# Patient Record
Sex: Female | Born: 1950
Health system: Southern US, Community
[De-identification: ages and names within clinical notes are randomized; demographics above are authoritative.]

## PROBLEM LIST (undated history)

## (undated) DIAGNOSIS — D126 Benign neoplasm of colon, unspecified: Secondary | ICD-10-CM

## (undated) DIAGNOSIS — F419 Anxiety disorder, unspecified: Secondary | ICD-10-CM

## (undated) DIAGNOSIS — K219 Gastro-esophageal reflux disease without esophagitis: Secondary | ICD-10-CM

## (undated) DIAGNOSIS — Z86010 Personal history of colonic polyps: Secondary | ICD-10-CM

## (undated) DIAGNOSIS — R51 Headache: Secondary | ICD-10-CM

## (undated) DIAGNOSIS — I10 Essential (primary) hypertension: Secondary | ICD-10-CM

## (undated) DIAGNOSIS — Z6841 Body Mass Index (BMI) 40.0 and over, adult: Secondary | ICD-10-CM

## (undated) DIAGNOSIS — M1990 Unspecified osteoarthritis, unspecified site: Secondary | ICD-10-CM

## (undated) DIAGNOSIS — R739 Hyperglycemia, unspecified: Secondary | ICD-10-CM

## (undated) DIAGNOSIS — E876 Hypokalemia: Secondary | ICD-10-CM

## (undated) DIAGNOSIS — E785 Hyperlipidemia, unspecified: Secondary | ICD-10-CM

## (undated) DIAGNOSIS — K589 Irritable bowel syndrome without diarrhea: Secondary | ICD-10-CM

## (undated) DIAGNOSIS — IMO0001 Reserved for inherently not codable concepts without codable children: Secondary | ICD-10-CM

## (undated) DIAGNOSIS — R519 Headache, unspecified: Secondary | ICD-10-CM

## (undated) DIAGNOSIS — K5730 Diverticulosis of large intestine without perforation or abscess without bleeding: Secondary | ICD-10-CM

## (undated) DIAGNOSIS — Z981 Arthrodesis status: Secondary | ICD-10-CM

## (undated) DIAGNOSIS — R1013 Epigastric pain: Secondary | ICD-10-CM

## (undated) DIAGNOSIS — M51369 Other intervertebral disc degeneration, lumbar region without mention of lumbar back pain or lower extremity pain: Secondary | ICD-10-CM

## (undated) DIAGNOSIS — T7840XA Allergy, unspecified, initial encounter: Secondary | ICD-10-CM

## (undated) DIAGNOSIS — K921 Melena: Secondary | ICD-10-CM

## (undated) DIAGNOSIS — K210 Gastro-esophageal reflux disease with esophagitis: Secondary | ICD-10-CM

## (undated) DIAGNOSIS — M5136 Other intervertebral disc degeneration, lumbar region: Secondary | ICD-10-CM

## (undated) DIAGNOSIS — D649 Anemia, unspecified: Secondary | ICD-10-CM

## (undated) DIAGNOSIS — J309 Allergic rhinitis, unspecified: Secondary | ICD-10-CM

## (undated) DIAGNOSIS — E669 Obesity, unspecified: Secondary | ICD-10-CM

## (undated) DIAGNOSIS — E119 Type 2 diabetes mellitus without complications: Secondary | ICD-10-CM

## (undated) HISTORY — PX: TONSILLECTOMY AND ADENOIDECTOMY: SUR1326

## (undated) HISTORY — DX: Anxiety disorder, unspecified: F41.9

## (undated) HISTORY — DX: Benign neoplasm of colon, unspecified: D12.6

## (undated) HISTORY — DX: Diverticulosis of large intestine without perforation or abscess without bleeding: K57.30

## (undated) HISTORY — PX: SPINE SURGERY: SHX786

## (undated) HISTORY — DX: Obesity, unspecified: E66.9

## (undated) HISTORY — PX: OTHER SURGICAL HISTORY: SHX169

## (undated) HISTORY — DX: Body Mass Index (BMI) 40.0 and over, adult: Z684

## (undated) HISTORY — DX: Essential (primary) hypertension: I10

## (undated) HISTORY — DX: Hyperlipidemia, unspecified: E78.5

## (undated) HISTORY — DX: Hypokalemia: E87.6

## (undated) HISTORY — DX: Type 2 diabetes mellitus without complications: E11.9

## (undated) HISTORY — DX: Arthrodesis status: Z98.1

## (undated) HISTORY — DX: Other intervertebral disc degeneration, lumbar region: M51.36

## (undated) HISTORY — DX: Other intervertebral disc degeneration, lumbar region without mention of lumbar back pain or lower extremity pain: M51.369

## (undated) HISTORY — DX: Unspecified osteoarthritis, unspecified site: M19.90

## (undated) HISTORY — DX: Allergy, unspecified, initial encounter: T78.40XA

## (undated) HISTORY — PX: ABDOMINAL HYSTERECTOMY: SHX81

## (undated) HISTORY — DX: Hyperglycemia, unspecified: R73.9

## (undated) HISTORY — DX: Gastro-esophageal reflux disease without esophagitis: K21.9

## (undated) HISTORY — DX: Melena: K92.1

## (undated) HISTORY — DX: Allergic rhinitis, unspecified: J30.9

## (undated) HISTORY — DX: Gastro-esophageal reflux disease with esophagitis: K21.0

## (undated) HISTORY — DX: Personal history of colonic polyps: Z86.010

## (undated) HISTORY — DX: Reserved for inherently not codable concepts without codable children: IMO0001

## (undated) HISTORY — DX: Epigastric pain: R10.13

## (undated) HISTORY — DX: Anemia, unspecified: D64.9

## (undated) HISTORY — DX: Irritable bowel syndrome without diarrhea: K58.9

---

## 1999-04-08 ENCOUNTER — Encounter (INDEPENDENT_AMBULATORY_CARE_PROVIDER_SITE_OTHER): Payer: Self-pay | Admitting: Specialist

## 1999-04-08 ENCOUNTER — Other Ambulatory Visit: Admission: RE | Admit: 1999-04-08 | Discharge: 1999-04-08 | Payer: Self-pay | Admitting: Gastroenterology

## 1999-04-08 ENCOUNTER — Encounter: Payer: Self-pay | Admitting: Gastroenterology

## 1999-05-04 ENCOUNTER — Emergency Department (HOSPITAL_COMMUNITY): Admission: EM | Admit: 1999-05-04 | Discharge: 1999-05-04 | Payer: Self-pay | Admitting: Emergency Medicine

## 2002-05-22 ENCOUNTER — Encounter: Payer: Self-pay | Admitting: Family Medicine

## 2002-05-22 ENCOUNTER — Encounter: Admission: RE | Admit: 2002-05-22 | Discharge: 2002-05-22 | Payer: Self-pay | Admitting: Family Medicine

## 2002-06-25 ENCOUNTER — Encounter: Admission: RE | Admit: 2002-06-25 | Discharge: 2002-09-23 | Payer: Self-pay | Admitting: Occupational Medicine

## 2005-06-30 ENCOUNTER — Encounter: Admission: RE | Admit: 2005-06-30 | Discharge: 2005-06-30 | Payer: Self-pay | Admitting: Orthopaedic Surgery

## 2005-10-12 ENCOUNTER — Inpatient Hospital Stay (HOSPITAL_COMMUNITY): Admission: RE | Admit: 2005-10-12 | Discharge: 2005-10-17 | Payer: Self-pay | Admitting: Orthopaedic Surgery

## 2006-10-09 DIAGNOSIS — D126 Benign neoplasm of colon, unspecified: Secondary | ICD-10-CM

## 2006-10-09 HISTORY — DX: Benign neoplasm of colon, unspecified: D12.6

## 2007-02-01 ENCOUNTER — Ambulatory Visit: Payer: Self-pay | Admitting: Gastroenterology

## 2007-02-15 ENCOUNTER — Ambulatory Visit: Payer: Self-pay | Admitting: Gastroenterology

## 2007-02-15 ENCOUNTER — Encounter: Payer: Self-pay | Admitting: Gastroenterology

## 2008-08-31 ENCOUNTER — Encounter: Payer: Self-pay | Admitting: Nurse Practitioner

## 2008-12-28 ENCOUNTER — Ambulatory Visit: Payer: Self-pay | Admitting: Gastroenterology

## 2008-12-28 DIAGNOSIS — K5730 Diverticulosis of large intestine without perforation or abscess without bleeding: Secondary | ICD-10-CM

## 2008-12-28 DIAGNOSIS — K2100 Gastro-esophageal reflux disease with esophagitis, without bleeding: Secondary | ICD-10-CM

## 2008-12-28 DIAGNOSIS — Z860100 Personal history of colon polyps, unspecified: Secondary | ICD-10-CM

## 2008-12-28 DIAGNOSIS — K219 Gastro-esophageal reflux disease without esophagitis: Secondary | ICD-10-CM

## 2008-12-28 DIAGNOSIS — R1013 Epigastric pain: Secondary | ICD-10-CM

## 2008-12-28 HISTORY — DX: Personal history of colon polyps, unspecified: Z86.0100

## 2008-12-28 HISTORY — DX: Epigastric pain: R10.13

## 2008-12-28 HISTORY — DX: Diverticulosis of large intestine without perforation or abscess without bleeding: K57.30

## 2008-12-28 HISTORY — DX: Gastro-esophageal reflux disease without esophagitis: K21.9

## 2008-12-28 HISTORY — DX: Gastro-esophageal reflux disease with esophagitis, without bleeding: K21.00

## 2008-12-28 HISTORY — DX: Personal history of colonic polyps: Z86.010

## 2008-12-29 ENCOUNTER — Ambulatory Visit: Payer: Self-pay | Admitting: Gastroenterology

## 2008-12-29 ENCOUNTER — Encounter: Payer: Self-pay | Admitting: Gastroenterology

## 2008-12-31 ENCOUNTER — Encounter: Payer: Self-pay | Admitting: Gastroenterology

## 2010-02-04 ENCOUNTER — Encounter (INDEPENDENT_AMBULATORY_CARE_PROVIDER_SITE_OTHER): Payer: Self-pay | Admitting: *Deleted

## 2010-03-22 ENCOUNTER — Encounter: Payer: Self-pay | Admitting: Gastroenterology

## 2010-04-15 ENCOUNTER — Encounter (INDEPENDENT_AMBULATORY_CARE_PROVIDER_SITE_OTHER): Payer: Self-pay | Admitting: *Deleted

## 2010-04-19 ENCOUNTER — Ambulatory Visit: Payer: Self-pay | Admitting: Gastroenterology

## 2010-05-03 ENCOUNTER — Ambulatory Visit: Payer: Self-pay | Admitting: Gastroenterology

## 2010-05-05 ENCOUNTER — Encounter: Payer: Self-pay | Admitting: Gastroenterology

## 2010-11-08 NOTE — Letter (Signed)
Summary: [facility] Instructions  [facility]  [address] [place], [place] [address]   Phone: [phone]  Fax: [phone]       AMPARO Herring    July 26, 1951    MRN: [id number]        Procedure Day /Date: Tuesday 05-03-10     Arrival Time: 12:30 p.m.      Procedure Time: 1:30 p.m.     Location of Procedure:                    _x_  [facility] (4th Floor)                        PREPARATION FOR COLONOSCOPY WITH MOVIPREP   Starting 5 days prior to your procedure  04-28-10 do not eat nuts, seeds, popcorn, corn, beans, peas,  salads, or any raw vegetables.  Do not take any fiber supplements (e.g. Metamucil, Citrucel, and Benefiber).  THE DAY BEFORE YOUR PROCEDURE         DATE:  05-02-10   DAY:  Monday  1.  Drink clear liquids the entire day-NO SOLID FOOD  2.  Do not drink anything colored red or purple.  Avoid juices with pulp.  No orange juice.  3.  Drink at least 64 oz. (8 glasses) of fluid/clear liquids during the day to prevent dehydration and help the prep work efficiently.  CLEAR LIQUIDS INCLUDE: Water Jello Ice Popsicles Tea (sugar ok, no milk/cream) Powdered fruit flavored drinks Coffee (sugar ok, no milk/cream) Gatorade Juice: apple, white grape, white cranberry  Lemonade Clear bullion, consomm, broth Carbonated beverages (any kind) Strained chicken noodle soup Hard Candy                             4.  In the morning, mix first dose of MoviPrep solution:    Empty 1 Pouch A and 1 Pouch B into the disposable container    Add lukewarm drinking water to the top line of the container. Mix to dissolve    Refrigerate (mixed solution should be used within 24 hrs)  5.  Begin drinking the prep at 5:00 p.m. The MoviPrep container is divided by 4 marks.   Every 15 minutes drink the solution down to the next mark (approximately 8 oz) until the full liter is complete.   6.  Follow completed prep with 16 oz of clear liquid of your choice  (Nothing red or purple).  Continue to drink clear liquids until bedtime.  7.  Before going to bed, mix second dose of MoviPrep solution:    Empty 1 Pouch A and 1 Pouch B into the disposable container    Add lukewarm drinking water to the top line of the container. Mix to dissolve    Refrigerate  THE DAY OF YOUR PROCEDURE      DATE:  05-03-10  DAY:  Tuesday  Beginning at  8:30 a.m. (5 hours before procedure):         1. Every 15 minutes, drink the solution down to the next mark (approx 8 oz) until the full liter is complete.  2. Follow completed prep with 16 oz. of clear liquid of your choice.    3. You may drink clear liquids until  11:30 a.m. (2 HOURS BEFORE PROCEDURE).   MEDICATION INSTRUCTIONS  Unless otherwise instructed, you should take regular prescription medications with a small sip of water  as early as possible the morning of your procedure.   Additional medication instructions:  Hold diuretics the morning of procedure.         OTHER INSTRUCTIONS  You will need a responsible adult at least 58 years of age to accompany you and drive you home.   This person must remain in the waiting room during your procedure.  Wear loose fitting clothing that is easily removed.  Leave jewelry and other valuables at home.  However, you may wish to bring a book to read or  an iPod/MP3 player to listen to music as you wait for your procedure to start.  Remove all body piercing jewelry and leave at home.  Total time from sign-in until discharge is approximately 2-3 hours.  You should go home directly after your procedure and rest.  You can resume normal activities the  day after your procedure.  The day of your procedure you should not:   Drive   Make legal decisions   Operate machinery   Drink alcohol   Return to work  You will receive specific instructions about eating, activities and medications before you leave.    The above instructions have been reviewed  and explained to me by  Megan Almas RN  April 19, 2010 2:31 PM     I fully understand and can verbalize these instructions _____________________________ Date _________

## 2010-11-08 NOTE — Miscellaneous (Signed)
Summary: [facility] Previsit/prep  Clinical Lists Changes  Medications: Added new medication of MOVIPREP 100 GM  SOLR (PEG-KCL-NACL-NASULF-NA ASC-C) As per prep instructions. - Signed Rx of MOVIPREP 100 GM  SOLR (PEG-KCL-NACL-NASULF-NA ASC-C) As per prep instructions.;  #1 x 0;  Signed;  Entered by: Wyona Almas RN;  Authorized by: Meryl Dare MD [facility];  Method used: Electronically to [id number] [address] [place], [place]  [address], Ph: [phone] or [phone], Fax: [phone] Observations: Added new observation of ALLERGY REV: Done (04/19/2010 13:52)    Prescriptions: MOVIPREP 100 GM  SOLR (PEG-KCL-NACL-NASULF-NA ASC-C) As per prep instructions.  #1 x 0   Entered by:   Wyona Almas RN   Authorized by:   Meryl Dare MD [facility]   Signed by:   Wyona Almas RN on 04/19/2010   Method used:   Electronically to        [id number] (retail)       [address]       [place], [place]  [address]       Ph: [phone] or [phone]       Fax: [phone]   RxID:   [id number]

## 2010-11-08 NOTE — Letter (Signed)
Summary: Colonoscopy Letter  [facility]  [address] [place], [place] [address]   Phone: [phone]  Fax: [phone]      February 04, 2010 MRN: [id number]   [name] [address] [place], [place]  [address]   Dear [name],   According to your medical record, it is time for you to schedule a Colonoscopy. The American Cancer Society recommends this procedure as a method to detect early colon cancer. Patients with a family history of colon cancer, or a personal history of colon polyps or inflammatory bowel disease are at increased risk.  This letter has beeen generated based on the recommendations made at the time of your procedure. If you feel that in your particular situation this may no longer apply, please contact our office.  Please call our office at [phone] to schedule this appointment or to update your records at your earliest convenience.  Thank you for cooperating with [place] to provide you with the very best care possible.   Sincerely,  Judie Petit T. Russella Dar, M.D.  [facility]astro[facility] [phone]

## 2010-11-08 NOTE — Procedures (Signed)
Summary: Colonoscopy  Patient: [name] Note: All result statuses are Final unless otherwise noted.  Tests: (1) Colonoscopy (COL)   COL Colonoscopy           DONE     [facility]     [address]     [place], [place]  [address]           COLONOSCOPY PROCEDURE REPORT           PATIENT:  [name], [name]  MR#:  [id number]     BIRTHDATE:  1951-03-13, 58 yrs. old  GENDER:  female     ENDOSCOPIST:  Judie Petit T. Russella Dar, MD, [facility]           PROCEDURE DATE:  05/03/2010     PROCEDURE:  Colonoscopy with snare polypectomy     ASA CLASS:  Class II     INDICATIONS:  1) follow-up of polyp, tubulovillous adenoma,     02/2007. 2) Screening and high-risk surveillance     MEDICATIONS:   Fentanyl 75 mcg IV, Versed 9 mg IV     DESCRIPTION OF PROCEDURE:   After the risks benefits and     alternatives of the procedure were thoroughly explained, informed     consent was obtained.  Digital rectal exam was performed and     revealed no abnormalities.   The LB [id number]-[id number] endoscope     was introduced through the anus and advanced to the cecum, which     was identified by both the appendix and ileocecal valve, without     limitations.  The quality of the prep was good, using MoviPrep.     The instrument was then slowly withdrawn as the colon was fully     examined.     <<PROCEDUREIMAGES>>     FINDINGS:  Severe diverticulosis was found ascending colon to     sigmoid colon. More concentrated in the left colon. Seven polyps     were found in the sigmoid colon. They were 4 - 6 mm in size.     Polyps were snared without cautery. Retrieval was successful. This     was otherwise a normal examination of the colon. Retroflexed views     in the rectum revealed no abnormalities. The time to cecum =  7     minutes. The scope was then withdrawn (time =  11.75  min) from the     patient and the procedure completed.           COMPLICATIONS:  None           ENDOSCOPIC IMPRESSION:     1) Severe  diverticulosis ascending colon to sigmoid colon     2) 4 - 6 mm, seven polyps in the sigmoid colon           RECOMMENDATIONS:     1) Await pathology results     2) High fiber diet with liberal fluid intake.     3) Repeat Colonoscopy in 3 years.           Venita Lick. Russella Dar, MD, [facility]           CC: Ellamae Sia, MD           n.     Rosalie DoctorVenita Lick. Aleesia Henney at 05/03/2010 02:12 PM           Kyla Balzarine, [phone]  Note: An exclamation mark (!) indicates a result that was not dispersed into the flowsheet.  Document Creation Date: 05/03/2010 2:12 PM _______________________________________________________________________  (1) Order result status: Final Collection or observation date-time: 05/03/2010 14:05 Requested date-time:  Receipt date-time:  Reported date-time:  Referring Physician:   Ordering Physician: Claudette Head [phone]) Specimen Source:  Source: Launa Grill Order Number: [id number] Lab site:   Appended Document: Colonoscopy     Procedures Next Due Date:    Colonoscopy: 04/2013

## 2010-11-08 NOTE — Letter (Signed)
Summary: Patient Notice-Hyperplastic Polyps  [facility]  [address] [place], [place] [address]   Phone: [phone]  Fax: [phone]        May 05, 2010 MRN: [id number]    [name] [address] [place], [place]  [address]    Dear [name],  I am pleased to inform you that the colon polyp(s) removed during your recent colonoscopy was (were) found to be hyperplastic. These types of polyps are NOT pre-cancerous.  It is my recommendation that you have a repeat colonoscopy examination in 3 years for routine colorectal cancer screening.  Should you develop new or worsening symptoms of abdominal pain, bowel habit changes or bleeding from the rectum or bowels, please schedule an evaluation with either your primary care physician or with me.  Continue treatment plan as outlined the day of your exam.  Please call us if you are having persistent problems or have questions about your condition that have not been fully answered at this time.  Sincerely,  Meryl Dare MD [facility]  This letter has been electronically signed by your physician.  Appended Document: Patient Notice-Hyperplastic Polyps letter mailed 8.1.2011

## 2011-02-24 NOTE — Discharge Summary (Signed)
[name], [name] NO.:  [id number]   MEDICAL RECORD NO.:  [id number]          PATIENT TYPE:  INP   LOCATION:  5024                         FACILITY:  [facility]   PHYSICIAN:  Sharolyn Douglas, M.D.        DATE OF BIRTH:  1951-04-19   DATE OF ADMISSION:  10/12/2005  DATE OF DISCHARGE:  10/17/2005                                 DISCHARGE SUMMARY   ADMITTING DIAGNOSES:  1.  Degenerative lumbar spondylolisthesis, L4-5, with spinal stenosis.  2.  Hypertension.  3.  Hypercholesterolemia.   DISCHARGE DIAGNOSES:  1.  Status post L4-5 decompression and posterior spinal fusion, doing well.  2.  Postoperative blood loss with anemia.  3.  Postoperative hyperglycemia.  4.  Hypertension.  5.  Hypercholesterolemia.   PROCEDURE:  On October 12, 2005, patient was taken to the operating room for  an L4-L5 posterior spinal fusion with pedicle screws.  This was done by Sharolyn Douglas, M.D., with assistant [facility], PA-C.  Anesthesia used was  general.   CONSULTS:  None.   LABS:  Preoperative CBC with diff was within normal limits.  H&H was  monitored times three days postoperatively and reached a low of 10.7 and  31.1 on October 15, 2005; no treatment was required.  Coags were within  normal limits preoperatively.  Preoperative complete metabolic panel was  normal with the exception of glucose slightly elevated at 111.  Basic  metabolic panel monitored times two days postoperatively.  Glucose was 131  and 111 on postoperative days one and two, respectively, and otherwise  within normal limits.  UA preop was normal.  Blood type was A positive.  Antibody screens were negative.  EKG from October 06, 2005, showed sinus  bradycardia; otherwise normal and confirmed in the chart.   X-RAYS:  Portable spine was used on October 12, 2005, for localization.  On  October 17, 2005, two views of lumbar spine showed status post L4-5 fusion.  Good position and alignment.   BRIEF HISTORY:  Patient  is a 60 year old female who has had longstanding  problems with her back, pain and radiation into the groin and down her lower  extremities, right worse than the left.  She also has cramping sensation in  her legs and sometimes feels as though her legs are going to give way.  She has had numerous types of conservative treatment, all unfortunately  without any lasting relief of her symptoms.  The pain has gotten to the  point that it was significantly affecting her activities of daily living and  quality of life.  She was quite miserable secondary to the severe pain.  Because of her x-ray and MRI findings as well as her physical exam findings  and pain, it was thought her best course of management was the above-listed  procedure.  Risks and benefits of this were discussed with the patient by  Dr. Noel Gerold.  She indicated understanding and opted to proceed.   HOSPITAL COURSE:  On October 12, 2005, patient was taken to the operating  room and underwent the above listed procedure.  She  tolerated the procedure  well without any intraoperative complications and was transferred to the  recovery room in stable condition.  Postoperatively, routine orthopedic  spine protocol was followed.  She progressed along well and did not develop  any significant complications postoperatively.  She worked with physical  therapy and occupational therapy on a daily basis.  She progressed along  well working on her progressive ambulation program as well as brace use and  back precautions.  By day of discharge, she was independent and understood  these well.   Starting on postoperative day two, daily dressing changes were done as well  as her Hemovac drain was discontinued.  Her incision continued to look good  throughout her entire hospital stay without any signs or symptoms of  infection.  On October 16, 2005, patient was doing relatively well; however,  she lives alone and she was not completely independent with all  of her  activities of daily living and ambulation as well as brace use and  therefore, she was kept one more day to work further with physical therapy.  By October 17, 2005, patient had met all her stated goals.  She was doing  well.  She was independent and she was medically stable and ready for  discharge home.   DISCHARGE PLAN:  Patient is a 60 year old female, status post posterior  spinal fusion, doing well.   ACTIVITY AND DISCHARGE INSTRUCTIONS:  Daily ambulation.  Brace on when she  is on her feet.  Back precautions at all time.  No lifting heavier than 5  pounds.  Daily dressing changes to her back.  Keep incision dry until all  drainage stops.   FOLLOWUP:  Two weeks postoperatively with Dr. Noel Gerold.   DISCHARGE MEDICATIONS:  1.  Percocet for pain.  2.  Robaxin for muscle spasms.  3.  Multivitamin daily.  4.  Calcium 1000 daily.  5.  Colace 100 mg twice daily.  6.  Laxative as needed.  7.  Resume home medications.   CONDITION ON DISCHARGE:  Stable and improved.   DISPOSITION:  Patient will be discharged to her home with family assistance  as well as home health physical therapy and occupational therapy.      Verlin Fester, P.A.      Sharolyn Douglas, M.D.  Electronically Signed    CM/MEDQ  D:  12/25/2005  T:  12/26/2005  Job:  [id number]   cc:   Sharolyn Douglas, M.D.  Fax: [phone]

## 2011-02-24 NOTE — Op Note (Signed)
[name], [place] NO.:  [id number]   MEDICAL RECORD NO.:  [id number]          PATIENT TYPE:  INP   LOCATION:  5024                         FACILITY:  [facility]   PHYSICIAN:  Sharolyn Douglas, M.D.        DATE OF BIRTH:  [birth year]   DATE OF PROCEDURE:  10/12/2005  DATE OF DISCHARGE:                                 OPERATIVE REPORT   DIAGNOSIS:  L4-5 spondylolisthesis with spinal stenosis.   PROCEDURE:  1.  L4-5 lumbar laminectomy with wide decompression thecal sac and nerve      roots.  2.  L4-5 transforaminal lumbar interbody fusion with placement of PEEK cage.  3.  L4-5 pedicle screw instrumentation using Abbott spine system.  4.  L4-5 posterior spinal arthrodesis with local autogenous bone graft      supplemented with bone morphogenic protein.   SURGEON:  Sharolyn Douglas, MD   ASSISTANT:  Verlin Fester, P.A.   ANESTHESIA:  General endotracheal.   COMPLICATIONS:  None.   ESTIMATED BLOOD LOSS:  200 mL.   INDICATIONS:  The patient is a pleasant 60 year old female with chronic  severe back and bilateral leg pain. She has mobile spondylolisthesis at L4-5  with underlying lateral recess narrowing. She has failed to respond to all  conservative treatment modalities and elected to undergo L4-5 decompression  and fusion in hopes of improving her symptoms. She knows the risks and  benefits including adjacent segment problems requiring additional surgery in  the future.   PROCEDURE:  The patient was identified in the holding area, taken to the  operating room. She underwent general endotracheal anesthesia without  difficulty, given prophylactic IV antibiotics. She was carefully positioned  on the Wilson frame. All bony prominences padded. Face and eyes protected at  all times. Back prepped and draped usual sterile fashion. Neuro monitoring  was established in the form of SSEPs and lower extremity EMGs. Midline  incision made from L3 down to L5. Dissection was carried to  the very deep  adipose layer. Subperiosteal exposure carried out to the tips of transverse  processes of L4 and L5.  The L4-5 joint was found be severely hypertrophied  and degenerative with large cyst. Deep retractors placed. Intraoperative x-  ray taken to confirm levels. Pedicle screws were then placed at L4 and L5  using anatomic probing technique. Each pedicle hole was palpated. There were  no breeches. We utilized 6.5 mm screws. The bone quality was good and the  screw purchase was excellent. Each screw was stimulated using triggered  EMGs. There were no deleterious changes. We then turned our attention to  completing a laminectomy at L4-5.  The entire spinous process and lamina of  L4 was removed.  The ligamentum flavum was removed piecemeal using curettes  and Kerrison punches. The lateral recess was decompressed flush with the  pedicles. We identified the transversing nerve roots and ensured that they  were decompressed. We then turned our attention to completing a  transforaminal lumbar interbody fusion on the left side at L4-5 by removing  the residual facette joint. The exiting transversing nerve roots  were  identified and protected.  Free running EMGs were monitored. Radical  diskectomy carried across the contralateral side. Disk space was then  dilated up to 9 mm.  The cartilaginous endplates were scraped. Disk space  packed with local autogenous bone graft along with BMP sponges. 9 mm PEEK  cage was packed with BMP sponge inserted into the interspace, tamped  anteriorly and across the midline. We then completed the posterior spinal  arthrodesis by decorticating the transverse processes of L4-L5 and packing  local bone graft into the lateral gutters. Short segment titanium rods were  placed in the polyaxial screw heads. Compression was applied before shearing  off the locking caps.  Hemostasis was achieved. The wound was irrigated.  Deep Hemovac drain left in place.  Intraoperative x-ray showed appropriate  positioning of the screws and interbody peak cage. The deep fascia closed  with a running #1 Vicryl suture. Subcutaneous layer closed with 2-0 and 0  Vicryl. Skin closed with 3-0 subcuticular Vicryl suture. Benzoin, Steri-  Strips placed. Sterile dressing applied. The patient was turned supine,  extubated without difficulty and transferred to recovery in stable  condition.      Sharolyn Douglas, M.D.  Electronically Signed     MC/MEDQ  D:  10/12/2005  T:  10/13/2005  Job:  [id number]

## 2011-02-24 NOTE — H&P (Signed)
[name], [name] NO.:  [id number]   MEDICAL RECORD NO.:  [id number]          PATIENT TYPE:  INP   LOCATION:  NA                           FACILITY:  [facility]   PHYSICIAN:  [name], M.D.        DATE OF BIRTH:  14-Aug-1951   DATE OF ADMISSION:  10/12/2005  DATE OF DISCHARGE:                                HISTORY & PHYSICAL   CHIEF COMPLAINT:  Pain in my back and leg.   PRESENT ILLNESS:  This 60 year old female seen by [place] for continuing and  progressive problems concerning pain into her lumbar spine with radiation  into the groin area and down in the lower extremities worse on the right  than left.  She has cramping sensation.  Sometimes feel like her legs are  going to give way.  She has had epidural steroid injections which  unfortunately did not last as much as desired.   Her pain began when she was mopping a bathroom floor ___________.  This  occurred on May 22, 2002.  She is really quite tired of her pain and  discomfort and after much discussion including the risks and benefits of  surgery decided to go ahead with surgical intervention due to the L4-L5  spondylolisthesis with accompanying stenosis with a posterior spinal fusion.   PAST MEDICAL HISTORY:  This lady has been in relatively good health  throughout her lifetime.  She is being treated by Dr. Alwyn Herring of  [facility].   CURRENT MEDICATIONS:  1.  Mobic.  2.  325 aspirin which she will stop prior to surgery.  3.  Hydrocodone for discomfort.  4.  Atenolol 50 mg one daily.  5.  Advicor 50/20 daily.  6.  Tums extra strength with calcium.  7.  Nasal sprays.  8.  Hydrin 5 moisturizing lotion on her skin.   She is allergic to PENICILLIN, TRIMOX, AMOXICILLIN, SULFA, PENICILLIN  DERIVATIVES AND SUBSTITUTES, and CLARITIN.   She has a history of migraine-type headaches.  Currently under high stress  due to her pain problem.  She is hypertensive.   PAST SURGICAL HISTORY:  1.   Tonsillectomy.  2.  Partial hysterectomy.  3.  Full hysterectomy secondary to endometriosis and scar tissue.  4.  Wisdom teeth extractions.   FAMILY HISTORY:  Positive for heart disease, hypertension, diabetes, cancer,  stroke, and arthritis.   SOCIAL HISTORY:  The patient is widowed.  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  She has no intake of alcohol or tobacco products.  Has no  children.  Caregiver after surgery will be her sister.   REVIEW OF SYSTEMS:  CNS:  No seizures, shoulder paralysis, double vision.  The patient does have a radiculitis and radiculopathy in the lower  extremities secondary to present illness.  CARDIOVASCULAR:  No chest pain.  No angina.  No orthopnea.  RESPIRATORY:  No productive cough.  No  hemoptysis.  No shortness of breath.  GASTROINTESTINAL:  No nausea,  vomiting, melena, bloody stools.  GENITOURINARY:  No discharge, dysuria,  hematuria but she does have stress  incontinence.  MUSCULOSKELETAL:  Primarily in present illness.   PHYSICAL EXAMINATION:  GENERAL:  Alert and cooperative, friendly, somewhat  obese 60 year old female.  She moves very carefully during the examination,  must support herself when coming to a standing position as well as when she  re-seats herself.  VITAL SIGNS:  Blood pressure 148/82, pulse 72, respirations 12.  HEENT:  Normocephalic.  PERRLA.  EOMs intact.  Oropharynx is clear.  CHEST:  Clear to auscultation.  No rhonchi.  No rales.  HEART:  Regular rate and rhythm.  No murmurs are heard.  ABDOMEN:  Soft, nontender.  Liver, spleen not felt.  GENITALIA:  Not done.  Not pertinent to present illness.  RECTAL:  Not done.  Not pertinent to present illness.  PELVIC:  Not done.  Not pertinent to present illness.  BREASTS:  Not done.  Not pertinent to present illness.  EXTREMITIES:  Reflexes of the lower extremities are intact and symmetric.  Sensation intact to light touch.  Straight leg raising negative.   ADMITTING  DIAGNOSES:  1.  Degenerative lumbar spondylolisthesis L4-L5 with stenosis.  2.  Hypertension.  3.  Hypercholesterolemia.   PLAN:  The patient will undergo L4-5 posterior spinal fusion and laminectomy  with pedicle screws, transforaminal lumbar interbody fusion with bone  morphogenic protein and Allograft.  Today she is fitted with an EBI  lumbosacral orthosis which will be used postoperatively.  This history and  physical was performed in our office on October 05, 2005.  The patient has  had a medical clearance from Dr. Alwyn Herring of [facility].      [name].      [name], M.D.  Electronically Signed    DLU/MEDQ  D:  10/05/2005  T:  10/05/2005  Job:  [id number]   cc:   [name], M.D.  Fax: [phone]

## 2011-11-07 ENCOUNTER — Other Ambulatory Visit: Payer: Self-pay | Admitting: Physician Assistant

## 2011-11-07 MED ORDER — HYDROCORTISONE 2.5 % EX OINT: TOPICAL_OINTMENT | Freq: Two times a day (BID) | CUTANEOUS | Status: AC

## 2011-12-19 ENCOUNTER — Ambulatory Visit: Payer: Medicare Other | Admitting: Emergency Medicine

## 2012-02-20 ENCOUNTER — Encounter: Payer: Self-pay | Admitting: Emergency Medicine

## 2012-02-20 ENCOUNTER — Ambulatory Visit (INDEPENDENT_AMBULATORY_CARE_PROVIDER_SITE_OTHER): Payer: BC Managed Care – PPO | Admitting: Emergency Medicine

## 2012-02-20 DIAGNOSIS — E119 Type 2 diabetes mellitus without complications: Secondary | ICD-10-CM

## 2012-02-20 DIAGNOSIS — I10 Essential (primary) hypertension: Secondary | ICD-10-CM | POA: Diagnosis not present

## 2012-02-20 DIAGNOSIS — E782 Mixed hyperlipidemia: Secondary | ICD-10-CM

## 2012-02-20 LAB — CBC WITH DIFFERENTIAL/PLATELET
Basophils Relative: 1 % (ref 0–1)
Eosinophils Absolute: 0.2 10*3/uL (ref 0.0–0.7)
HCT: 42.7 % (ref 36.0–46.0)
Hemoglobin: 14.2 g/dL (ref 12.0–15.0)
Lymphocytes Relative: 42 % (ref 12–46)
MCHC: 33.3 g/dL (ref 30.0–36.0)
MCV: 86.6 fL (ref 78.0–100.0)
Monocytes Absolute: 0.4 10*3/uL (ref 0.1–1.0)
Neutro Abs: 1.8 10*3/uL (ref 1.7–7.7)
Neutrophils Relative %: 42 % — ABNORMAL LOW (ref 43–77)
Platelets: 263 10*3/uL (ref 150–400)
RBC: 4.93 MIL/uL (ref 3.87–5.11)
RDW: 14.7 % (ref 11.5–15.5)
WBC: 4.3 10*3/uL (ref 4.0–10.5)

## 2012-02-20 LAB — COMPREHENSIVE METABOLIC PANEL
BUN: 19 mg/dL (ref 6–23)
CO2: 28 mEq/L (ref 19–32)
Calcium: 9.9 mg/dL (ref 8.4–10.5)
Creat: 0.84 mg/dL (ref 0.50–1.10)
Sodium: 141 mEq/L (ref 135–145)
Total Protein: 7 g/dL (ref 6.0–8.3)

## 2012-02-20 LAB — LIPID PANEL
Total CHOL/HDL Ratio: 4.7 Ratio
VLDL: 21 mg/dL (ref 0–40)

## 2012-02-20 LAB — GLUCOSE, POCT (MANUAL RESULT ENTRY): POC Glucose: 107

## 2012-02-20 NOTE — Progress Notes (Signed)
  Subjective:    Patient ID: [name], female    DOB: 1951/06/03, 61 y.o.   MRN: [id number]  HPI patient here to recheck on her hypertension and high cholesterol and diabetes. She also has had some mild discomfort in the left side of her abdomen but overall has been doing well.    Review of Systems check she has been doing well following her diet trying to take better care of herself     Objective:   Physical Exam  Constitutional: She appears well-developed and well-nourished.  Eyes: Pupils are equal, round, and reactive to light.  Cardiovascular: Normal rate and regular rhythm.     Results for orders placed in visit on 02/20/12  POCT GLYCOSYLATED HEMOGLOBIN (HGB A1C)      Component Value Range   Hemoglobin A1C 5.9    GLUCOSE, POCT (MANUAL RESULT ENTRY)      Component Value Range   POC Glucose 107          Assessment & Plan:  Diabetes is better. Blood pressure is good we'll await her cholesterol and changes depending on the results.

## 2012-04-23 ENCOUNTER — Telehealth: Payer: Self-pay

## 2012-04-23 NOTE — Telephone Encounter (Signed)
Left message on machine to call back  

## 2012-04-23 NOTE — Telephone Encounter (Signed)
Pt needs to speak with Dr Cleta Alberts or nurse she was advised by one of the pa at Dr Noel Gerold about her blood circulation please contact after 1pm today [phone].Marland Kitchen

## 2012-04-23 NOTE — Telephone Encounter (Signed)
Spoke with patient advised her to return to clinic to see Dr Cleta Alberts tomorrow about veins up on her legs her spine Dr. advised her to se  Her primary doctor

## 2012-04-24 ENCOUNTER — Ambulatory Visit (INDEPENDENT_AMBULATORY_CARE_PROVIDER_SITE_OTHER): Payer: Medicare Other | Admitting: Emergency Medicine

## 2012-04-24 VITALS — BP 110/88 | HR 61 | Temp 97.8°F | Resp 18 | Ht 62.5 in | Wt 239.8 lb

## 2012-04-24 DIAGNOSIS — K5900 Constipation, unspecified: Secondary | ICD-10-CM

## 2012-04-24 DIAGNOSIS — N63 Unspecified lump in unspecified breast: Secondary | ICD-10-CM

## 2012-04-24 DIAGNOSIS — R609 Edema, unspecified: Secondary | ICD-10-CM

## 2012-04-24 LAB — POCT CBC
HCT, POC: 45.6 % (ref 37.7–47.9)
Hemoglobin: 14.2 g/dL (ref 12.2–16.2)
Lymph, poc: 1.9 (ref 0.6–3.4)
MCH, POC: 28.6 pg (ref 27–31.2)
MCV: 91.8 fL (ref 80–97)
MID (cbc): 0.5 (ref 0–0.9)
MPV: 9.4 fL (ref 0–99.8)
POC LYMPH PERCENT: 39 %L (ref 10–50)
POC MID %: 10.7 %M (ref 0–12)
Platelet Count, POC: 264 10*3/uL (ref 142–424)
RBC: 4.97 M/uL (ref 4.04–5.48)
WBC: 4.9 10*3/uL (ref 4.6–10.2)

## 2012-04-24 LAB — RENAL FUNCTION PANEL
BUN: 14 mg/dL (ref 6–23)
CO2: 31 mEq/L (ref 19–32)
Calcium: 10 mg/dL (ref 8.4–10.5)
Glucose, Bld: 111 mg/dL — ABNORMAL HIGH (ref 70–99)
Phosphorus: 3.1 mg/dL (ref 2.3–4.6)
Potassium: 3.8 mEq/L (ref 3.5–5.3)
Sodium: 141 mEq/L (ref 135–145)

## 2012-04-24 NOTE — Progress Notes (Signed)
  Subjective:    Patient ID: [name], female    DOB: 07-Apr-1951, 61 y.o.   MRN: [id number]  HPI patient enters for checkup of swelling of the lower extremities she is seen back Dr.Cohen For chronic back problems. She would like to get schedule for a mammogram but has not been able to do that yet. She needs to have Her breast exam prior to getting insurance approval for mammogram.    Review of Systems     Objective:   Physical Exam chest is clear cardiac exam is unremarkable breast exam reveals a vein present over the superior portion of the left breast but no masses are felt there is no nipple retraction examination of the lower extremities really trace edema. She has good pulses and mild varicosities of the lower extremities.  Results for orders placed in visit on 04/24/12  POCT CBC      Component Value Range   WBC 4.9  4.6 - 10.2 K/uL   Lymph, poc 1.9  0.6 - 3.4   POC LYMPH PERCENT 39.0  10 - 50 %L   MID (cbc) 0.5  0 - 0.9   POC MID % 10.7  0 - 12 %M   POC Granulocyte 2.5  2 - 6.9   Granulocyte percent 50.3  37 - 80 %G   RBC 4.97  4.04 - 5.48 M/uL   Hemoglobin 14.2  12.2 - 16.2 g/dL   HCT, POC 40.9  81.1 - 47.9 %   MCV 91.8  80 - 97 fL   MCH, POC 28.6  27 - 31.2 pg   MCHC 31.1 (*) 31.8 - 35.4 g/dL   RDW, POC 91.4     Platelet Count, POC 264  142 - 424 K/uL   MPV 9.4  0 - 99.8 fL        Assessment & Plan:  Patient has varicose veins and this is most likely the source of her swelling in the lower extremities. I do not think is medication related. I will offer her an evaluation by the vein specialist and in the interim she can try wearing support hose. I advised her to go ahead with her mammogram and we will make that appointment. She's also been having some trouble with watery stools after she takes MiraLax and I advised her to switch over to citracel and try that for her constipation problem

## 2012-05-28 ENCOUNTER — Other Ambulatory Visit: Payer: Self-pay | Admitting: Emergency Medicine

## 2012-07-02 ENCOUNTER — Ambulatory Visit (INDEPENDENT_AMBULATORY_CARE_PROVIDER_SITE_OTHER): Payer: BC Managed Care – PPO | Admitting: Emergency Medicine

## 2012-07-02 ENCOUNTER — Other Ambulatory Visit: Payer: Self-pay

## 2012-07-02 ENCOUNTER — Ambulatory Visit: Payer: Medicare Other

## 2012-07-02 ENCOUNTER — Encounter: Payer: Self-pay | Admitting: Emergency Medicine

## 2012-07-02 VITALS — BP 128/90 | HR 52 | Temp 98.1°F | Resp 16 | Ht 62.0 in | Wt 236.8 lb

## 2012-07-02 DIAGNOSIS — Z9109 Other allergy status, other than to drugs and biological substances: Secondary | ICD-10-CM

## 2012-07-02 DIAGNOSIS — R7309 Other abnormal glucose: Secondary | ICD-10-CM

## 2012-07-02 DIAGNOSIS — Z0000 Encounter for general adult medical examination without abnormal findings: Secondary | ICD-10-CM

## 2012-07-02 DIAGNOSIS — R739 Hyperglycemia, unspecified: Secondary | ICD-10-CM

## 2012-07-02 DIAGNOSIS — M542 Cervicalgia: Secondary | ICD-10-CM | POA: Diagnosis not present

## 2012-07-02 DIAGNOSIS — M1990 Unspecified osteoarthritis, unspecified site: Secondary | ICD-10-CM | POA: Insufficient documentation

## 2012-07-02 DIAGNOSIS — Z23 Encounter for immunization: Secondary | ICD-10-CM | POA: Diagnosis not present

## 2012-07-02 DIAGNOSIS — E669 Obesity, unspecified: Secondary | ICD-10-CM

## 2012-07-02 DIAGNOSIS — I10 Essential (primary) hypertension: Secondary | ICD-10-CM

## 2012-07-02 DIAGNOSIS — R748 Abnormal levels of other serum enzymes: Secondary | ICD-10-CM

## 2012-07-02 DIAGNOSIS — Z981 Arthrodesis status: Secondary | ICD-10-CM | POA: Insufficient documentation

## 2012-07-02 DIAGNOSIS — E785 Hyperlipidemia, unspecified: Secondary | ICD-10-CM | POA: Diagnosis not present

## 2012-07-02 DIAGNOSIS — M549 Dorsalgia, unspecified: Secondary | ICD-10-CM

## 2012-07-02 HISTORY — DX: Arthrodesis status: Z98.1

## 2012-07-02 LAB — CBC WITH DIFFERENTIAL/PLATELET
Basophils Absolute: 0 10*3/uL (ref 0.0–0.1)
HCT: 39 % (ref 36.0–46.0)
Lymphs Abs: 1.6 10*3/uL (ref 0.7–4.0)
MCHC: 34.9 g/dL (ref 30.0–36.0)
Monocytes Absolute: 0.5 10*3/uL (ref 0.1–1.0)
Neutro Abs: 2.7 10*3/uL (ref 1.7–7.7)
Neutrophils Relative %: 51 % (ref 43–77)
Platelets: 242 10*3/uL (ref 150–400)
RDW: 14.6 % (ref 11.5–15.5)
WBC: 5.2 10*3/uL (ref 4.0–10.5)

## 2012-07-02 LAB — COMPREHENSIVE METABOLIC PANEL
Albumin: 4.6 g/dL (ref 3.5–5.2)
Alkaline Phosphatase: 67 U/L (ref 39–117)
BUN: 17 mg/dL (ref 6–23)
Chloride: 101 mEq/L (ref 96–112)
Creat: 0.87 mg/dL (ref 0.50–1.10)
Potassium: 3.8 mEq/L (ref 3.5–5.3)
Sodium: 142 mEq/L (ref 135–145)
Total Protein: 7 g/dL (ref 6.0–8.3)

## 2012-07-02 LAB — LIPID PANEL
HDL: 49 mg/dL (ref >39)
Total CHOL/HDL Ratio: 4.6 Ratio

## 2012-07-02 MED ORDER — FLUTICASONE PROPIONATE 50 MCG/ACT NA SUSP: 2.0000 | Freq: Every day | NASAL | Status: DC

## 2012-07-02 MED ORDER — GABAPENTIN 300 MG PO CAPS: ORAL_CAPSULE | ORAL | Status: DC

## 2012-07-02 NOTE — Progress Notes (Signed)
  Subjective:    Patient ID: [name], female    DOB: 01/04/1951, 61 y.o.   MRN: [id number]  HPI Comments: Patient enters for her yearly physical exam with her major complaint being discomfort in the right side of her neck and into her right shoulder and down her right arm     Review of Systems  Constitutional: Negative.   HENT: Positive for postnasal drip and tinnitus.   Eyes: Positive for photophobia and itching.  Respiratory: Negative.   Cardiovascular: Negative.   Gastrointestinal: Positive for nausea, diarrhea and constipation.  Genitourinary: Positive for pelvic pain.       Chest pain in the pelvic area which is presumed to be secondary to her previous back surgery to  Musculoskeletal: Positive for myalgias, back pain and joint swelling.  Hematological: Negative.   Psychiatric/Behavioral: Negative.        Objective:   Physical Exam  Constitutional: She is oriented to person, place, and time. She appears well-developed and well-nourished.  HENT:  Head: Normocephalic.  Right Ear: External ear normal.  Left Ear: External ear normal.  Eyes: Conjunctivae normal and EOM are normal. Pupils are equal, round, and reactive to light. Right eye exhibits no discharge. Left eye exhibits no discharge.  Neck: Normal range of motion. Neck supple. No tracheal deviation present. No thyromegaly present.  Cardiovascular: Normal rate, regular rhythm, normal heart sounds and intact distal pulses.  Exam reveals no gallop and no friction rub.   No murmur heard. Pulmonary/Chest: Effort normal and breath sounds normal. No respiratory distress. She has no wheezes. She has no rales. She exhibits no tenderness.  Abdominal: Soft. Bowel sounds are normal. She exhibits no distension and no mass. There is no tenderness. There is no rebound and no guarding.  Musculoskeletal:       There is tenderness over the right side the neck and into the right shoulder there is pain with movement of the right  shoulder deep tendon reflexes of the upper extremities are 2+ and symmetrical. There is a large scar over the lumbar spine which extends from about L2 all the way down to L5.  Neurological: She is alert and oriented to person, place, and time. She has normal reflexes.  Skin: Skin is warm and dry.  Psychiatric: She has a normal mood and affect. Her behavior is normal. Judgment and thought content normal.   [facility] reading (PRIMARY) by  Dr. Cleta Alberts is extensive degenerative disc disease C4-5 C5-6 and C6-7 with significant arthritic change and spur formation         Assessment & Plan:  Her major complaint today is of pain in her shoulder and down to her right arm. She is up-to-date on colonoscopy up-to-date on tetanus. She was vaccinated against influenza today. She will be given a prescription for shingles vaccine. I would like to have on a cholesterol drug that she has been intolerant to statins in the past with elevation of her CPK . She continues to have low back pain and pain into her pelvic area. She is status post major surgery over her lumbar spine. I think referral again for physical therapy to work on her low back would be important to improve the discomfort she feels her low back on a constant basis. We'll give a trial of Neurontin.

## 2012-07-02 NOTE — Patient Instructions (Addendum)
Recheck 6 months. If her neck pain continues to be a problem let me know and we will schedule an MRI

## 2012-07-09 DIAGNOSIS — M5030 Other cervical disc degeneration, unspecified cervical region: Secondary | ICD-10-CM | POA: Diagnosis not present

## 2012-07-11 DIAGNOSIS — M5030 Other cervical disc degeneration, unspecified cervical region: Secondary | ICD-10-CM | POA: Diagnosis not present

## 2012-07-17 LAB — IFOBT (OCCULT BLOOD): IFOBT: NEGATIVE

## 2012-08-27 ENCOUNTER — Telehealth: Payer: Self-pay

## 2012-08-27 ENCOUNTER — Other Ambulatory Visit: Payer: Self-pay | Admitting: Physician Assistant

## 2012-08-27 MED ORDER — ATENOLOL 50 MG PO TABS: 50.0000 mg | ORAL_TABLET | Freq: Every day | ORAL | Status: DC

## 2012-08-27 NOTE — Telephone Encounter (Signed)
Called patient advised rx sent in

## 2012-08-27 NOTE — Telephone Encounter (Signed)
Patient is calling to refill her Tenormin. She says she has called [place] 2 times already but I do not see any encounters. She also says [facility] has sent [place] 2 requests to refill.  Best [phone]  [facility] - [place] ([place]), [place] - [address]

## 2012-09-11 ENCOUNTER — Telehealth: Payer: Self-pay

## 2012-09-11 MED ORDER — HYDROCHLOROTHIAZIDE 25 MG PO TABS: 25.0000 mg | ORAL_TABLET | Freq: Every day | ORAL | Status: DC

## 2012-09-11 NOTE — Telephone Encounter (Signed)
Pt needs refill on hydrochlorothiazide (HYDRODIURIL) 25 MG tablet Saw Dr. Cleta Alberts and forgot to request refills for six months.  CBN:  [id number]

## 2012-09-11 NOTE — Telephone Encounter (Signed)
Pt advised this is sent in

## 2012-10-09 HISTORY — PX: COLONOSCOPY: SHX174

## 2012-11-26 ENCOUNTER — Other Ambulatory Visit: Payer: Self-pay | Admitting: Physician Assistant

## 2012-12-23 ENCOUNTER — Encounter: Payer: Self-pay | Admitting: Emergency Medicine

## 2012-12-26 ENCOUNTER — Other Ambulatory Visit: Payer: Self-pay | Admitting: Radiology

## 2012-12-26 MED ORDER — HYDROCHLOROTHIAZIDE 25 MG PO TABS: 25.0000 mg | ORAL_TABLET | Freq: Every day | ORAL | Status: DC

## 2012-12-26 NOTE — Telephone Encounter (Signed)
Sent in HCTZ

## 2012-12-31 ENCOUNTER — Encounter: Payer: Self-pay | Admitting: Emergency Medicine

## 2012-12-31 ENCOUNTER — Ambulatory Visit (INDEPENDENT_AMBULATORY_CARE_PROVIDER_SITE_OTHER): Payer: Medicare Other | Admitting: Emergency Medicine

## 2012-12-31 VITALS — BP 115/60 | HR 51 | Temp 96.8°F | Resp 18 | Ht 63.0 in | Wt 239.0 lb

## 2012-12-31 DIAGNOSIS — I10 Essential (primary) hypertension: Secondary | ICD-10-CM | POA: Diagnosis not present

## 2012-12-31 DIAGNOSIS — R300 Dysuria: Secondary | ICD-10-CM

## 2012-12-31 DIAGNOSIS — E785 Hyperlipidemia, unspecified: Secondary | ICD-10-CM

## 2012-12-31 LAB — COMPREHENSIVE METABOLIC PANEL
ALT: 17 U/L (ref 0–35)
AST: 22 U/L (ref 0–37)
Albumin: 4.2 g/dL (ref 3.5–5.2)
Alkaline Phosphatase: 62 U/L (ref 39–117)
BUN: 24 mg/dL — ABNORMAL HIGH (ref 6–23)
Calcium: 10.1 mg/dL (ref 8.4–10.5)
Chloride: 102 mEq/L (ref 96–112)
Potassium: 3.6 mEq/L (ref 3.5–5.3)

## 2012-12-31 LAB — CBC WITH DIFFERENTIAL/PLATELET
Basophils Absolute: 0.1 10*3/uL (ref 0.0–0.1)
Basophils Relative: 1 % (ref 0–1)
Eosinophils Absolute: 0.3 10*3/uL (ref 0.0–0.7)
Eosinophils Relative: 4 % (ref 0–5)
MCH: 28.9 pg (ref 26.0–34.0)
MCV: 83.7 fL (ref 78.0–100.0)
Neutrophils Relative %: 48 % (ref 43–77)
Platelets: 281 10*3/uL (ref 150–400)
RDW: 15.6 % — ABNORMAL HIGH (ref 11.5–15.5)

## 2012-12-31 LAB — LIPID PANEL
LDL Cholesterol: 129 mg/dL — ABNORMAL HIGH (ref 0–99)
Total CHOL/HDL Ratio: 3.6 Ratio
VLDL: 19 mg/dL (ref 0–40)

## 2012-12-31 LAB — POCT URINALYSIS DIPSTICK
Blood, UA: NEGATIVE
Ketones, UA: NEGATIVE
Protein, UA: NEGATIVE
Spec Grav, UA: 1.025
Urobilinogen, UA: 0.2
pH, UA: 6

## 2012-12-31 MED ORDER — ROSUVASTATIN CALCIUM 10 MG PO TABS: 10.0000 mg | ORAL_TABLET | Freq: Every day | ORAL | Status: DC

## 2012-12-31 MED ORDER — HYDROCHLOROTHIAZIDE 12.5 MG PO TABS: 12.5000 mg | ORAL_TABLET | Freq: Every day | ORAL | Status: DC

## 2012-12-31 NOTE — Progress Notes (Signed)
  Subjective:    Patient ID: [name], female    DOB: 1951/02/26, 62 y.o.   MRN: [id number]  HPI pt presents for follow up on BP. Right side has been swollen since her fall a few months ago. She has been going to physical therapy for this. She is up to date on colonoscopy and mammogram. She was advised to come back for a recheck on her mammogram in 6 months but feels that because she has fibrous breasts, it is not necessary.  At night she has phlegm that makes her feel like she is choking. She will try saline spray as other nasal sprays dry her out.   Review of Systems     Objective:   Physical Exam patient is alert and cooperative and in no distress. Her neck is supple. Her chest is clear to auscultation and percussion heart is regular rate without murmurs. Abdomen is obese and without masses extremities are without edema        Assessment & Plan:  Patient is stating she is having trouble with sweats and dehydration. We'll go ahead and cut her diuretic by half and see if that helps. Routine labs were done today

## 2013-01-29 ENCOUNTER — Telehealth: Payer: Self-pay

## 2013-01-29 NOTE — Telephone Encounter (Signed)
About an hour after she takes her Crestor and her HCTZ she breaks out into a rash around her neck. The rash only last a short time and is resolved by morning. It does cause itching but denies difficulty breathing. She reports she has been taking the medications for the past 28 days and has not noticed the rash. She was also taking Zyrtec for allergies and has since stopped. Advised pt to come in to be seen for her rash.

## 2013-01-29 NOTE — Telephone Encounter (Signed)
Patient called in stating Dr. Cleta Alberts had put her on a medication for her cholesterol and now she has a rash on her neck until the morning after you take the medication at night.  The rash goes away in the morning and shows up again once she has taken the medication.

## 2013-01-30 ENCOUNTER — Ambulatory Visit (INDEPENDENT_AMBULATORY_CARE_PROVIDER_SITE_OTHER): Payer: BC Managed Care – PPO | Admitting: Emergency Medicine

## 2013-01-30 VITALS — BP 149/72 | HR 48 | Temp 98.5°F | Resp 16 | Ht 62.0 in | Wt 242.0 lb

## 2013-01-30 DIAGNOSIS — F411 Generalized anxiety disorder: Secondary | ICD-10-CM

## 2013-01-30 DIAGNOSIS — R439 Unspecified disturbances of smell and taste: Secondary | ICD-10-CM

## 2013-01-30 DIAGNOSIS — R21 Rash and other nonspecific skin eruption: Secondary | ICD-10-CM

## 2013-01-30 DIAGNOSIS — Z9109 Other allergy status, other than to drugs and biological substances: Secondary | ICD-10-CM

## 2013-01-30 MED ORDER — EPINEPHRINE 0.3 MG/0.3ML IJ DEVI: 0.3000 mg | Freq: Once | INTRAMUSCULAR | Status: DC

## 2013-01-30 MED ORDER — ALPRAZOLAM 0.5 MG PO TABS: ORAL_TABLET | ORAL | Status: DC

## 2013-01-30 NOTE — Progress Notes (Signed)
  Subjective:    Patient ID: [name], female    DOB: 16-Aug-1951, 62 y.o.   MRN: [id number]  HPI 62 yo female with complaints of itching at face, neck, and arms. Feels like throat is trying to tighten up. Unable to pinpoint when this started, but thinks it was approximately 2 weeks ago and has gotten progressively worse. Has sensitive skin and seasonal allergies. Has tried zyrtec and benadryl, which has helped, but not resolved the problem. Has been told she has eczema. Uses Dove sensitive skin soap. Has not changed soaps or detergents. Lives with mother who keeps thermostat was 80+ degrees, with no windows open or fans on. Has used rubbing alcohol on skin to try to make itching stop.   Had anaphylactic reaction once before, years ago. Was given an epipen at that time, which she no longer carries. Only new medication is Crestor started 30 days ago.     Review of Systems  Constitutional: Negative for fever and chills.  HENT: Positive for congestion (mornings) and postnasal drip.   Respiratory: Negative for cough.   Allergic/Immunologic: Positive for environmental allergies.       Objective:   Physical Exam  Constitutional: She is oriented to person, place, and time. She appears well-developed and well-nourished.  HENT:  Mouth/Throat: Oropharynx is clear and moist. No oropharyngeal exudate.  No appreciable swelling or masses in oropharynx  Cardiovascular: Normal rate, regular rhythm and normal heart sounds.   Pulmonary/Chest: Effort normal and breath sounds normal.  Neurological: She is alert and oriented to person, place, and time.  Skin: Skin is warm and dry. No rash noted. No erythema. No pallor.          Assessment & Plan:  Pruritis due to allergies. Advised to wash pollen off of skin after going outside, as she may have a contact dermatitis/reaction to the pollen. Continue with zyrtec and benadryl as needed. Given 1% hydrocortisone cream to use as needed.  Given a  refill on Epipen. We taught her how to use it with demo. Advised that if she does have a severe allergic reaction, to call [phone] then use Epipen.

## 2013-01-30 NOTE — Patient Instructions (Signed)
Wash pollen off of skin after going outside. Continue zyrtec daily and benadryl as needed. Use hydrocortisone as needed. We have given you a refill on your Epipen. Carry it with you and use only in emergencies.

## 2013-02-10 ENCOUNTER — Other Ambulatory Visit: Payer: Self-pay | Admitting: Physician Assistant

## 2013-02-11 ENCOUNTER — Encounter: Payer: Self-pay | Admitting: Emergency Medicine

## 2013-02-19 ENCOUNTER — Other Ambulatory Visit: Payer: Self-pay | Admitting: Physician Assistant

## 2013-04-29 ENCOUNTER — Encounter: Payer: Self-pay | Admitting: Emergency Medicine

## 2013-04-29 ENCOUNTER — Ambulatory Visit (INDEPENDENT_AMBULATORY_CARE_PROVIDER_SITE_OTHER): Payer: BC Managed Care – PPO | Admitting: Emergency Medicine

## 2013-04-29 VITALS — BP 122/80 | HR 55 | Temp 98.0°F | Resp 16 | Ht 62.0 in | Wt 245.4 lb

## 2013-04-29 DIAGNOSIS — R609 Edema, unspecified: Secondary | ICD-10-CM

## 2013-04-29 DIAGNOSIS — I10 Essential (primary) hypertension: Secondary | ICD-10-CM

## 2013-04-29 NOTE — Progress Notes (Signed)
  Subjective:    Patient ID: [name], female    DOB: Dec 12, 1950, 62 y.o.   MRN: [id number]  HPI patient did relatively well at present she still has significant swelling related to use of gabapentin and recently receiving a steroid shot. She's had no shortness of breath. She would like her feet checked be sure she has not had PAD.    Review of Systems     Objective:   Physical Exam chest is clear to auscultation and percussion. Heart regular rate no murmurs extremity exam reveals 2+ dorsalis pedis pulses.        Assessment & Plan:  No evidence of PAD by exam. She had all her blood work checked last visit and looked good. If patient calls go ahead and refill all of her medications as needed. Will resume her regular dose of diuretic that was halved last visit. Recheck blood work on her next visit. Her next visit will be for a physical.

## 2013-05-02 ENCOUNTER — Encounter: Payer: Self-pay | Admitting: Gastroenterology

## 2013-07-19 ENCOUNTER — Other Ambulatory Visit: Payer: Self-pay | Admitting: Emergency Medicine

## 2013-07-20 ENCOUNTER — Other Ambulatory Visit: Payer: Self-pay | Admitting: Emergency Medicine

## 2013-07-23 ENCOUNTER — Telehealth: Payer: Self-pay

## 2013-07-23 NOTE — Telephone Encounter (Signed)
PT STATES THE FLUID PILLS SHE WAS GIVEN ISN'T STRONGER ENOUGH, SHE USE TO TAKE THE 25MG S BUT THEY LOWER THE DOSAGE AND IT'S NOT WORKING, NEED THE 25MG S PLEASE CALL [phone]    [facility] [address]

## 2013-07-23 NOTE — Telephone Encounter (Signed)
Patient requesting HCTZ 25 , states she has been taking 12.5 but not helpful please advise.

## 2013-07-24 MED ORDER — HYDROCHLOROTHIAZIDE 25 MG PO TABS: 25.0000 mg | ORAL_TABLET | Freq: Every day | ORAL | Status: DC

## 2013-07-24 NOTE — Telephone Encounter (Signed)
Okay to increase the dose of her diuretic to 25 mg a day. Patient needs to return to clinic in one month for a basic metabolic panel to check kidney function and potassium

## 2013-07-24 NOTE — Telephone Encounter (Signed)
Thank you. Have called her to advise.

## 2013-08-06 ENCOUNTER — Other Ambulatory Visit: Payer: Self-pay | Admitting: Emergency Medicine

## 2013-08-14 ENCOUNTER — Encounter: Payer: Self-pay | Admitting: *Deleted

## 2013-09-15 ENCOUNTER — Telehealth: Payer: Self-pay

## 2013-09-15 MED ORDER — HYDROCHLOROTHIAZIDE 25 MG PO TABS: 25.0000 mg | ORAL_TABLET | Freq: Every day | ORAL | Status: DC

## 2013-09-15 NOTE — Telephone Encounter (Signed)
Patient is calling to see if we got request from her pharmacy for her fluid pill please call her at [phone]

## 2013-09-16 ENCOUNTER — Ambulatory Visit (INDEPENDENT_AMBULATORY_CARE_PROVIDER_SITE_OTHER): Payer: BC Managed Care – PPO | Admitting: Emergency Medicine

## 2013-09-16 ENCOUNTER — Encounter: Payer: Self-pay | Admitting: Emergency Medicine

## 2013-09-16 ENCOUNTER — Other Ambulatory Visit: Payer: Self-pay | Admitting: Emergency Medicine

## 2013-09-16 VITALS — BP 124/82 | HR 50 | Temp 98.0°F | Resp 16 | Ht 62.5 in | Wt 243.0 lb

## 2013-09-16 DIAGNOSIS — J309 Allergic rhinitis, unspecified: Secondary | ICD-10-CM | POA: Insufficient documentation

## 2013-09-16 DIAGNOSIS — Z1211 Encounter for screening for malignant neoplasm of colon: Secondary | ICD-10-CM

## 2013-09-16 DIAGNOSIS — I10 Essential (primary) hypertension: Secondary | ICD-10-CM

## 2013-09-16 DIAGNOSIS — Z9109 Other allergy status, other than to drugs and biological substances: Secondary | ICD-10-CM

## 2013-09-16 DIAGNOSIS — Z0000 Encounter for general adult medical examination without abnormal findings: Secondary | ICD-10-CM

## 2013-09-16 DIAGNOSIS — R21 Rash and other nonspecific skin eruption: Secondary | ICD-10-CM

## 2013-09-16 DIAGNOSIS — E785 Hyperlipidemia, unspecified: Secondary | ICD-10-CM

## 2013-09-16 HISTORY — DX: Allergic rhinitis, unspecified: J30.9

## 2013-09-16 LAB — COMPLETE METABOLIC PANEL WITH GFR
ALT: 20 U/L (ref 0–35)
AST: 27 U/L (ref 0–37)
Alkaline Phosphatase: 63 U/L (ref 39–117)
BUN: 21 mg/dL (ref 6–23)
Calcium: 9.8 mg/dL (ref 8.4–10.5)
Chloride: 101 mEq/L (ref 96–112)
Creat: 0.94 mg/dL (ref 0.50–1.10)
GFR, Est African American: 76 mL/min
GFR, Est Non African American: 66 mL/min
Glucose, Bld: 115 mg/dL — ABNORMAL HIGH (ref 70–99)
Total Bilirubin: 0.3 mg/dL (ref 0.3–1.2)

## 2013-09-16 LAB — LIPID PANEL
Cholesterol: 125 mg/dL (ref 0–200)
HDL: 45 mg/dL (ref >39)
LDL Cholesterol: 60 mg/dL (ref 0–99)
Total CHOL/HDL Ratio: 2.8 Ratio
Triglycerides: 102 mg/dL (ref <150)
VLDL: 20 mg/dL (ref 0–40)

## 2013-09-16 LAB — CBC WITH DIFFERENTIAL/PLATELET
Basophils Absolute: 0 10*3/uL (ref 0.0–0.1)
Basophils Relative: 1 % (ref 0–1)
Eosinophils Relative: 6 % — ABNORMAL HIGH (ref 0–5)
HCT: 39.5 % (ref 36.0–46.0)
Hemoglobin: 13.6 g/dL (ref 12.0–15.0)
MCH: 29.1 pg (ref 26.0–34.0)
MCHC: 34.4 g/dL (ref 30.0–36.0)
MCV: 84.4 fL (ref 78.0–100.0)
Monocytes Absolute: 0.6 10*3/uL (ref 0.1–1.0)
Monocytes Relative: 12 % (ref 3–12)
Neutro Abs: 2.3 10*3/uL (ref 1.7–7.7)
RDW: 15.3 % (ref 11.5–15.5)

## 2013-09-16 MED ORDER — ROSUVASTATIN CALCIUM 10 MG PO TABS: 10.0000 mg | ORAL_TABLET | Freq: Every day | ORAL | Status: DC

## 2013-09-16 MED ORDER — FLUTICASONE PROPIONATE 50 MCG/ACT NA SUSP: NASAL | Status: DC

## 2013-09-16 MED ORDER — ATENOLOL 50 MG PO TABS: 50.0000 mg | ORAL_TABLET | Freq: Every day | ORAL | Status: DC

## 2013-09-16 MED ORDER — HYDROCORTISONE 2.5 % EX OINT: TOPICAL_OINTMENT | CUTANEOUS | Status: DC

## 2013-09-16 MED ORDER — AZELASTINE HCL 0.1 % NA SOLN: 2.0000 | Freq: Two times a day (BID) | NASAL | Status: DC

## 2013-09-16 MED ORDER — HYDROCHLOROTHIAZIDE 25 MG PO TABS: 25.0000 mg | ORAL_TABLET | Freq: Every day | ORAL | Status: DC

## 2013-09-16 NOTE — Progress Notes (Signed)
   Subjective:    Patient ID: [name], female    DOB: 1951/08/07, 62 y.o.   MRN: [id number]  HPI    Review of Systems  Constitutional: Positive for activity change.  HENT: Positive for facial swelling, hearing loss, nosebleeds, postnasal drip, rhinorrhea, sinus pressure, sneezing and sore throat.   Eyes: Positive for itching.  Respiratory: Positive for cough.   Cardiovascular: Positive for leg swelling.  Gastrointestinal: Positive for nausea.  Endocrine: Positive for cold intolerance and heat intolerance.  Genitourinary: Negative.   Musculoskeletal: Positive for arthralgias, back pain, gait problem, joint swelling, myalgias and neck stiffness.  Skin: Negative.   Allergic/Immunologic: Positive for environmental allergies and food allergies.  Neurological: Positive for dizziness, light-headedness and headaches.  Hematological: Negative.   Psychiatric/Behavioral: Positive for sleep disturbance.       Objective:   Physical Exam HEENT exam is unremarkable. Neck supple. There are no carotid bruits. Chest is clear to auscultation and percussion. Heart regular rate no murmurs. Abdomen soft nontender. Breast exam reveals no masses. Extremities without cyanosis clubbing or edema there are arthritic changes of the knees         Assessment & Plan:  Appointment made for screening colonoscopy and for patient had her mammogram. She will make her own appointment to see Dr. Nile Riggs. She does have a history of reflux esophagitis and colonic polyps and has not followed up with them. I advised her to get some help at home and caring for her mother in that she is working 24 7 caring for her 69 year old mother. She has previously had her flu shot. She declines the shingles vaccine. Mammogram was also ordered it was supposed to have been done in August

## 2013-09-19 LAB — IFOBT (OCCULT BLOOD): IFOBT: POSITIVE

## 2013-09-22 ENCOUNTER — Telehealth: Payer: Self-pay

## 2013-09-22 NOTE — Telephone Encounter (Signed)
I do not see where we have called her but she does need to make appt for the colonoscopy. Called her left message to advise.

## 2013-09-22 NOTE — Telephone Encounter (Signed)
Patient is returning a call that she thinks might be about lab results.  [phone]

## 2013-09-22 NOTE — Telephone Encounter (Signed)
Patient returning phone call for lab results. She says its okay to leave a voice message, she has it marked on her hippa form for our office.  Please advise, no one was available at the moment. I couldn't give results over phone since I'm clerical, patient understood.    Best: [phone]

## 2013-09-23 NOTE — Telephone Encounter (Signed)
The lab has advised her of the results/ she does have appt for the colonoscopy

## 2013-10-28 ENCOUNTER — Other Ambulatory Visit: Payer: Self-pay | Admitting: Emergency Medicine

## 2013-10-28 ENCOUNTER — Ambulatory Visit (AMBULATORY_SURGERY_CENTER): Payer: Self-pay

## 2013-10-28 VITALS — Ht 62.5 in | Wt 244.8 lb

## 2013-10-28 DIAGNOSIS — K921 Melena: Secondary | ICD-10-CM

## 2013-10-28 DIAGNOSIS — Z86010 Personal history of colonic polyps: Secondary | ICD-10-CM

## 2013-10-28 MED ORDER — MOVIPREP 100 G PO SOLR: ORAL | Status: DC

## 2013-10-30 ENCOUNTER — Encounter: Payer: Self-pay | Admitting: Gastroenterology

## 2013-11-11 ENCOUNTER — Encounter: Payer: Self-pay | Admitting: Gastroenterology

## 2013-11-11 ENCOUNTER — Ambulatory Visit (AMBULATORY_SURGERY_CENTER): Payer: BC Managed Care – PPO | Admitting: Gastroenterology

## 2013-11-11 VITALS — BP 127/68 | HR 55 | Temp 96.3°F | Resp 20 | Ht 62.0 in | Wt 244.0 lb

## 2013-11-11 DIAGNOSIS — Z86010 Personal history of colonic polyps: Secondary | ICD-10-CM

## 2013-11-11 DIAGNOSIS — D126 Benign neoplasm of colon, unspecified: Secondary | ICD-10-CM

## 2013-11-11 MED ORDER — SODIUM CHLORIDE 0.9 % IV SOLN: 500.0000 mL | INTRAVENOUS | Status: DC

## 2013-11-11 NOTE — Patient Instructions (Signed)
Discharge instructions given with verbal understanding. Handouts on polyps and diverticulosis. Resume previous medications. YOU HAD AN ENDOSCOPIC PROCEDURE TODAY AT THE [facility]: Refer to the procedure report that was given to you for any specific questions about what was found during the examination.  If the procedure report does not answer your questions, please call your gastroenterologist to clarify.  If you requested that your care partner not be given the details of your procedure findings, then the procedure report has been included in a sealed envelope for you to review at your convenience later.  YOU SHOULD EXPECT: Some feelings of bloating in the abdomen. Passage of more gas than usual.  Walking can help get rid of the air that was put into your GI tract during the procedure and reduce the bloating. If you had a lower endoscopy (such as a colonoscopy or flexible sigmoidoscopy) you may notice spotting of blood in your stool or on the toilet paper. If you underwent a bowel prep for your procedure, then you may not have a normal bowel movement for a few days.  DIET: Your first meal following the procedure should be a light meal and then it is ok to progress to your normal diet.  A half-sandwich or bowl of soup is an example of a good first meal.  Heavy or fried foods are harder to digest and may make you feel nauseous or bloated.  Likewise meals heavy in dairy and vegetables can cause extra gas to form and this can also increase the bloating.  Drink plenty of fluids but you should avoid alcoholic beverages for 24 hours.  ACTIVITY: Your care partner should take you home directly after the procedure.  You should plan to take it easy, moving slowly for the rest of the day.  You can resume normal activity the day after the procedure however you should NOT DRIVE or use heavy machinery for 24 hours (because of the sedation medicines used during the test).    SYMPTOMS TO REPORT  IMMEDIATELY: A gastroenterologist can be reached at any hour.  During normal business hours, 8:30 AM to 5:00 PM Monday through Friday, call [phone].  After hours and on weekends, please call the GI answering service at [phone] who will take a message and have the physician on call contact you.   Following lower endoscopy (colonoscopy or flexible sigmoidoscopy):  Excessive amounts of blood in the stool  Significant tenderness or worsening of abdominal pains  Swelling of the abdomen that is new, acute  Fever of 100F or higher  FOLLOW UP: If any biopsies were taken you will be contacted by phone or by letter within the next 1-3 weeks.  Call your gastroenterologist if you have not heard about the biopsies in 3 weeks.  Our staff will call the home number listed on your records the next business day following your procedure to check on you and address any questions or concerns that you may have at that time regarding the information given to you following your procedure. This is a courtesy call and so if there is no answer at the home number and we have not heard from you through the emergency physician on call, we will assume that you have returned to your regular daily activities without incident.  SIGNATURES/CONFIDENTIALITY: You and/or your care partner have signed paperwork which will be entered into your electronic medical record.  These signatures attest to the fact that that the information above on your After Visit Summary   has been reviewed and is understood.  Full responsibility of the confidentiality of this discharge information lies with you and/or your care-partner. 

## 2013-11-11 NOTE — Progress Notes (Signed)
A/ox3 pleased with MAC, report to celia RN 

## 2013-11-11 NOTE — Op Note (Signed)
[facility]  [address] [place], [address]   COLONOSCOPY PROCEDURE REPORT PATIENT: Megan, Herring  MR#: [id number] BIRTHDATE: [birth year] , 62  yrs. old GENDER: Female ENDOSCOPIST: Ladene Artist, MD, [facility] PROCEDURE DATE:  11/11/2013 PROCEDURE:   Colonoscopy with snare polypectomy First Screening Colonoscopy - Avg.  risk and is 50 yrs.  old or older - No.  Prior Negative Screening - Now for repeat screening. N/A  History of Adenoma - Now for follow-up colonoscopy & has been > or = to 3 yrs.  Yes hx of adenoma.  Has been 3 or more years since last colonoscopy.  Polyps Removed Today? Yes. ASA CLASS:   Class II INDICATIONS:Patient's personal history of adenomatous colon polyps, TVA in 2008. MEDICATIONS: MAC sedation, administered by CRNA and propofol (Diprivan) 200mg  IV DESCRIPTION OF PROCEDURE:   After the risks benefits and alternatives of the procedure were thoroughly explained, informed consent was obtained.  A digital rectal exam revealed no abnormalities of the rectum.   The LB [id number]  endoscope was introduced through the anus and advanced to the cecum, which was identified by both the appendix and ileocecal valve. No adverse events experienced.   The quality of the prep was good, using MoviPrep  The instrument was then slowly withdrawn as the colon was fully examined.  COLON FINDINGS: A sessile polyp measuring 5 mm in size was found in the ascending colon.  A polypectomy was performed with a cold snare.  The resection was complete and the polyp tissue was completely retrieved.   A sessile polyp measuring 4 mm in size was found in the transverse colon.  A polypectomy was performed with a cold snare.  The resection was complete and the polyp tissue was completely retrieved.   Severe diverticulosis was noted in the ascending colon, at the hepatic flexure, in the transverse colon, at the splenic flexure, in the descending colon, and sigmoid  colon. The colon was otherwise normal.  There was no diverticulosis, inflammation, polyps or cancers unless previously stated. Retroflexed views revealed no abnormalities. The time to cecum=2 minutes 41 seconds.  Withdrawal time=8 minutes 44 seconds.  The scope was withdrawn and the procedure completed. COMPLICATIONS: There were no complications.  ENDOSCOPIC IMPRESSION: 1.   Sessile polyp measuring 5 mm in the ascending colon; polypectomy performed with a cold snare 2.   Sessile polyp measuring 4 mm in the transverse colon; polypectomy performed with a cold snare 3.   Severe diverticulosis in the ascending, hepatic flexure, transverse, splenic flexure, descending, and sigmoid colon  RECOMMENDATIONS: 1.  Await pathology results 2.  High fiber diet with liberal fluid intake. 3.  Repeat Colonoscopy in 5 years.  eSigned:  Ladene Artist, MD, [facility] 11/11/2013 2:27 PM

## 2013-11-11 NOTE — Progress Notes (Signed)
Called to room to assist during endoscopic procedure.  Patient ID and intended procedure confirmed with present staff. Received instructions for my participation in the procedure from the performing physician.  

## 2013-11-12 ENCOUNTER — Telehealth: Payer: Self-pay | Admitting: *Deleted

## 2013-11-12 NOTE — Telephone Encounter (Signed)
Message left

## 2013-11-18 ENCOUNTER — Encounter: Payer: Self-pay | Admitting: Gastroenterology

## 2013-11-22 ENCOUNTER — Ambulatory Visit (INDEPENDENT_AMBULATORY_CARE_PROVIDER_SITE_OTHER): Payer: BC Managed Care – PPO | Admitting: Emergency Medicine

## 2013-11-22 VITALS — BP 130/80 | HR 88 | Temp 101.3°F | Resp 18 | Ht 62.5 in | Wt 242.5 lb

## 2013-11-22 DIAGNOSIS — J018 Other acute sinusitis: Secondary | ICD-10-CM | POA: Diagnosis not present

## 2013-11-22 DIAGNOSIS — R112 Nausea with vomiting, unspecified: Secondary | ICD-10-CM

## 2013-11-22 MED ORDER — ONDANSETRON 8 MG PO TBDP: 8.0000 mg | ORAL_TABLET | Freq: Three times a day (TID) | ORAL | Status: DC | PRN

## 2013-11-22 MED ORDER — LEVOFLOXACIN 500 MG PO TABS: 500.0000 mg | ORAL_TABLET | Freq: Every day | ORAL | Status: AC

## 2013-11-22 NOTE — Patient Instructions (Signed)

## 2013-11-22 NOTE — Progress Notes (Signed)
[facility] [address], [place] [address] [phone]  Date:  11/22/2013   Name:  [name]   DOB:  05-Jun-1951   MRN:  [id number]  PCP:  Jenny Reichmann, MD    Chief Complaint: Nausea   History of Present Illness:  [name] is a 63 y.o. very pleasant female patient who presents with the following:  Awoke at 0400 with fever and headache.  Temp 101-102   Says she was nauseated and vomited several times.  No diarrhea.  No further nausea or vomiting.  No rash.  No wheezing or shortness of breath.  No cough.  Has nasal congestion and drainage but hasn't looked at it.  Has frequent sinus problems and infection.  Says headache is frontal.  No improvement with over the counter medications or other home remedies. Denies other complaint or health concern today.   Patient Active Problem List   Diagnosis Date Noted  . Allergic rhinitis 09/16/2013  . Arthritis 07/02/2012  . S/P lumbar spinal fusion 07/02/2012  . REFLUX ESOPHAGITIS 12/28/2008  . GERD 12/28/2008  . DIVERTICULOSIS-COLON 12/28/2008  . Abdominal pain, epigastric 12/28/2008  . PERSONAL HX COLONIC POLYPS 12/28/2008    Past Medical History  Diagnosis Date  . Hypertension   . Allergy   . Arthritis   . Anemia   . Anxiety   . GERD (gastroesophageal reflux disease)   . Obesity   . DDD (degenerative disc disease), lumbar   . Blood in stool     Past Surgical History  Procedure Laterality Date  . Spine surgery    . Abdominal hysterectomy    . Tonsillectomy and adenoidectomy      History  Substance Use Topics  . Smoking status: Former Smoker -- 1.00 packs/day for 38 years    Types: Cigarettes    Quit date: 10/28/1997  . Smokeless tobacco: Never Used  . Alcohol Use: No    Family History  Problem Relation Age of Onset  . Hypertension Mother   . Hyperlipidemia Mother   . Hyperlipidemia Father   . Hypertension Father   . Arthritis Father   . Hyperlipidemia Sister   .  Cervical cancer Sister   . Diabetes Sister   . Heart disease Sister   . Cancer Maternal Grandfather   . Hypertension Paternal Grandmother   . Pancreatic cancer Maternal Aunt   . Breast cancer Paternal Aunt   . Breast cancer Cousin     Allergies  Allergen Reactions  . Erythromycin     rash  . Lansoprazole   . Penicillins     rash  . Robaxin [Methocarbamol]     Messed up stomach  . Sulfonamide Derivatives     rash  . Trental [Pentoxifylline Er]     Bad headache    Medication list has been reviewed and updated.  Current Outpatient Prescriptions on File Prior to Visit  Medication Sig Dispense Refill  . ALPRAZolam (XANAX) 0.5 MG tablet 1/2 to 1 daily prn  20 tablet  1  . atenolol (TENORMIN) 50 MG tablet Take 1 tablet (50 mg total) by mouth daily.  30 tablet  11  . azelastine (ASTELIN) 137 MCG/SPRAY nasal spray Place 2 sprays into both nostrils 2 (two) times daily. Use in each nostril as directed  30 mL  12  . cyclobenzaprine (FLEXERIL) 10 MG tablet Take 10 mg by mouth 3 (three) times daily as needed.      Marland Kitchen EPINEPHrine (EPIPEN)  0.3 mg/0.3 mL DEVI Inject 0.3 mLs (0.3 mg total) into the muscle once.  1 Device  2  . fluticasone (FLONASE) 50 MCG/ACT nasal spray USE TWO SPRAY IN EACH NOSTRIL EVERY DAY  16 g  11  . hydrochlorothiazide (HYDRODIURIL) 25 MG tablet Take 1 tablet (25 mg total) by mouth daily.  30 tablet  11  . HYDROcodone-acetaminophen (VICODIN) 5-500 MG per tablet Take 1 tablet by mouth every 6 (six) hours as needed. 325/10 mg      . hydrocortisone 2.5 % ointment APPLY TWICE A DAY  60 g  11  . meloxicam (MOBIC) 15 MG tablet Take 15 mg by mouth daily.      . Multiple Vitamins-Minerals (MULTI-VITAMIN GUMMIES PO) Take by mouth as needed.      Marland Kitchen omeprazole (PRILOSEC) 20 MG capsule Take 20 mg by mouth 2 (two) times daily.      . rosuvastatin (CRESTOR) 10 MG tablet Take 1 tablet (10 mg total) by mouth daily.  30 tablet  11   No current facility-administered medications on file  prior to visit.    Review of Systems:  As per HPI, otherwise negative.    Physical Examination: Filed Vitals:   11/22/13 1229  BP: 130/80  Pulse: 88  Temp: 101.3 F (38.5 C)  Resp: 18   Filed Vitals:   11/22/13 1229  Height: 5' 2.5" (1.588 m)  Weight: 242 lb 8 oz (109.997 kg)   Body mass index is 43.62 kg/(m^2). Ideal Body Weight: Weight in (lb) to have BMI = 25: 138.6  GEN: WDWN, NAD, Non-toxic, A & O x 3 HEENT: Atraumatic, Normocephalic. Neck supple. No masses, No LAD. Ears and Nose: No external deformity. CV: RRR, No M/G/R. No JVD. No thrill. No extra heart sounds. PULM: CTA B, no wheezes, crackles, rhonchi. No retractions. No resp. distress. No accessory muscle use. ABD: S, NT, ND, +BS. No rebound. No HSM. EXTR: No c/c/e NEURO Normal gait.  PSYCH: Normally interactive. Conversant. Not depressed or anxious appearing.  Calm demeanor.    Assessment and Plan: Sinusitis mucinex augmentin zofran  Signed,  Ellison Carwin, MD

## 2014-03-17 ENCOUNTER — Ambulatory Visit (INDEPENDENT_AMBULATORY_CARE_PROVIDER_SITE_OTHER): Payer: BC Managed Care – PPO | Admitting: Emergency Medicine

## 2014-03-17 ENCOUNTER — Encounter: Payer: Self-pay | Admitting: Emergency Medicine

## 2014-03-17 VITALS — BP 120/76 | HR 68 | Temp 97.6°F | Resp 18 | Ht 62.5 in | Wt 247.0 lb

## 2014-03-17 DIAGNOSIS — R7309 Other abnormal glucose: Secondary | ICD-10-CM

## 2014-03-17 DIAGNOSIS — E785 Hyperlipidemia, unspecified: Secondary | ICD-10-CM

## 2014-03-17 DIAGNOSIS — I10 Essential (primary) hypertension: Secondary | ICD-10-CM

## 2014-03-17 DIAGNOSIS — R635 Abnormal weight gain: Secondary | ICD-10-CM

## 2014-03-17 DIAGNOSIS — R739 Hyperglycemia, unspecified: Secondary | ICD-10-CM

## 2014-03-17 DIAGNOSIS — M2550 Pain in unspecified joint: Secondary | ICD-10-CM

## 2014-03-17 HISTORY — DX: Hyperlipidemia, unspecified: E78.5

## 2014-03-17 HISTORY — DX: Hyperglycemia, unspecified: R73.9

## 2014-03-17 LAB — CBC
HEMATOCRIT: 39.1 % (ref 36.0–46.0)
HEMOGLOBIN: 13.2 g/dL (ref 12.0–15.0)
MCH: 28.3 pg (ref 26.0–34.0)
MCHC: 33.8 g/dL (ref 30.0–36.0)
MCV: 83.9 fL (ref 78.0–100.0)
Platelets: 230 10*3/uL (ref 150–400)
RBC: 4.66 MIL/uL (ref 3.87–5.11)
RDW: 15.5 % (ref 11.5–15.5)
WBC: 4.5 10*3/uL (ref 4.0–10.5)

## 2014-03-17 LAB — TSH: TSH: 1.975 u[IU]/mL (ref 0.350–4.500)

## 2014-03-17 LAB — GLUCOSE, POCT (MANUAL RESULT ENTRY): POC Glucose: 135 mg/dl — AB (ref 70–99)

## 2014-03-17 LAB — POCT GLYCOSYLATED HEMOGLOBIN (HGB A1C): Hemoglobin A1C: 6.3

## 2014-03-17 NOTE — Progress Notes (Signed)
   Subjective:    Patient ID: [name], female    DOB: 1950/11/22, 63 y.o.   MRN: [id number]  HPI patient here for followup of her elevated blood pressure, and cholesterol. She overall has been doing well. She has not scheduled her mammogram for this year but plans to do this she denies chest pain shortness of breath. She recently has had a lot of difficulty with orthopedic problems. She has required multiple injections of different joints to Dr. Towanda Malkin office. Otherwise she seems to be doing well.    Review of Systems     Objective:   Physical Exam patient is alert and cooperative she is in no distress neck supple chest clear heart regular rate no murmurs abdomen protuberant without masses. Extremities without edema. Results for orders placed in visit on 03/17/14  POCT GLYCOSYLATED HEMOGLOBIN (HGB A1C)      Result Value Ref Range   Hemoglobin A1C 6.3    GLUCOSE, POCT (MANUAL RESULT ENTRY)      Result Value Ref Range   POC Glucose 135 (*) 70 - 99 mg/dl         Assessment & Plan:  Routine labs were done today. I did advise her to go ahead and have her mammogram done. She is scheduled for her physical exam in the winter. Her blood pressure looks good

## 2014-03-17 NOTE — Patient Instructions (Addendum)
Insomnia Insomnia is frequent trouble falling and/or staying asleep. Insomnia can be a long term problem or a short term problem. Both are common. Insomnia can be a short term problem when the wakefulness is related to a certain stress or worry. Long term insomnia is often related to ongoing stress during waking hours and/or poor sleeping habits. Overtime, sleep deprivation itself can make the problem worse. Every little thing feels more severe because you are overtired and your ability to cope is decreased. CAUSES   Stress, anxiety, and depression.  Poor sleeping habits.  Distractions such as TV in the bedroom.  Naps close to bedtime.  Engaging in emotionally charged conversations before bed.  Technical reading before sleep.  Alcohol and other sedatives. They may make the problem worse. They can hurt normal sleep patterns and normal dream activity.  Stimulants such as caffeine for several hours prior to bedtime.  Pain syndromes and shortness of breath can cause insomnia.  Exercise late at night.  Changing time zones may cause sleeping problems (jet lag). It is sometimes helpful to have someone observe your sleeping patterns. They should look for periods of not breathing during the night (sleep apnea). They should also look to see how long those periods last. If you live alone or observers are uncertain, you can also be observed at a sleep clinic where your sleep patterns will be professionally monitored. Sleep apnea requires a checkup and treatment. Give your caregivers your medical history. Give your caregivers observations your family has made about your sleep.  SYMPTOMS   Not feeling rested in the morning.  Anxiety and restlessness at bedtime.  Difficulty falling and staying asleep. TREATMENT   Your caregiver may prescribe treatment for an underlying medical disorders. Your caregiver can give advice or help if you are using alcohol or other drugs for self-medication. Treatment  of underlying problems will usually eliminate insomnia problems.  Medications can be prescribed for short time use. They are generally not recommended for lengthy use.  Over-the-counter sleep medicines are not recommended for lengthy use. They can be habit forming.  You can promote easier sleeping by making lifestyle changes such as:  Using relaxation techniques that help with breathing and reduce muscle tension.  Exercising earlier in the day.  Changing your diet and the time of your last meal. No night time snacks.  Establish a regular time to go to bed.  Counseling can help with stressful problems and worry.  Soothing music and white noise may be helpful if there are background noises you cannot remove.  Stop tedious detailed work at least one hour before bedtime. HOME CARE INSTRUCTIONS   Keep a diary. Inform your caregiver about your progress. This includes any medication side effects. See your caregiver regularly. Take note of:  Times when you are asleep.  Times when you are awake during the night.  The quality of your sleep.  How you feel the next day. This information will help your caregiver care for you.  Get out of bed if you are still awake after 15 minutes. Read or do some quiet activity. Keep the lights down. Wait until you feel sleepy and go back to bed.  Keep regular sleeping and waking hours. Avoid naps.  Exercise regularly.  Avoid distractions at bedtime. Distractions include watching television or engaging in any intense or detailed activity like attempting to balance the household checkbook.  Develop a bedtime ritual. Keep a familiar routine of bathing, brushing your teeth, climbing into bed at the same   time each night, listening to soothing music. Routines increase the success of falling to sleep faster.  Use relaxation techniques. This can be using breathing and muscle tension release routines. It can also include visualizing peaceful scenes. You can  also help control troubling or intruding thoughts by keeping your mind occupied with boring or repetitive thoughts like the old concept of counting sheep. You can make it more creative like imagining planting one beautiful flower after another in your backyard garden.  During your day, work to eliminate stress. When this is not possible use some of the previous suggestions to help reduce the anxiety that accompanies stressful situations. MAKE SURE YOU:   Understand these instructions.  Will watch your condition.  Will get help right away if you are not doing well or get worse. Document Released: 09/22/2000 Document Revised: 12/18/2011 Document Reviewed: 10/23/2007 [facility] Patient Information 2014 [place]. Diabetes, Type 2, Am I At Risk? Diabetes is a lasting (chronic) disease. In type 2 diabetes, the pancreas does not make enough insulin, and the body does not respond normally to the insulin that is made. This type of diabetes was also previously called adult onset diabetes. About 90% of all those who have diabetes have type 2. It usually occurs after the age of 44, but can occur at any age.  People develop type 2 diabetes because they do not use insulin properly. Eventually, the pancreas cannot make enough insulin for the body's needs. Over time, the amount of glucose (sugar) in the blood increases. RISK FACTORS  Overweight  the more weight you have, the more resistant your cells become to insulin.  Family history  you are more likely to get diabetes if a parent or sibling has diabetes.  Race certain races get diabetes more.  African Americans.  American Indians.  Asian Americans.  Hispanics.  Pacific Islander.  Inactive exercise helps control weight and helps your cells be more sensitive to insulin.  Gestational diabetes  some women develop diabetes while they are pregnant. This goes away when they deliver. However, they are 50-60% more likely to develop type 2 diabetes  at a later time.  Having a baby over 9 pounds  a sign that you may have had gestational diabetes.  Age the risk of diabetes goes up as you get older, especially after age 45.  High blood pressure (hypertension). SYMPTOMS Many people have no signs or symptoms. Symptoms can be so mild that you might not even notice them. Some of these signs are:  Increased thirst.  Increased hunger.  Tiredness (fatigue).  Increased urination, especially at night.  Weight loss.  Blurred vision.  Sores that do not heal. WHO SHOULD BE TESTED?  Anyone 3 years or older, especially if overweight, should consider getting tested.  If you are younger than 43, overweight, and have one or more of the risk factors, you should consider getting tested. DIAGNOSIS  Fasting blood glucose (FBS). Usually, 2 are done.  FBS 101-125 mg/dl is considered pre-diabetes.  FBS 126 mg/dl or greater is considered diabetes.  2 hour Oral Glucose Tolerance Test (OGTT). This test is preformed by first having you not eat or drink for several hours. You are then given something sweet to drink and your blood glucose is measured fasting, at one hour and 2 hours. This test tells how well you are able to handle sugars or carbohydrates.  Fasting: 60-100 mg/dl.  1 hour: less than 200 mg/dl.  2 hours: less than 140 mg/dl.  A1c A1c  is a blood glucose test that gives and average of your blood glucose over 3 months. It is the accepted method to use to diagnose diabetes.  A1c 5.7-6.4% is considered pre-diabetes.  A1c 6.5% or greater is considered diabetes. WHAT DOES IT MEAN TO HAVE PRE-DIABETES? Pre-diabetes means you are at risk for getting type 2 diabetes. Your blood glucose is higher than normal, but not yet high enough to diagnose diabetes. The good news is, if you have pre-diabetes you can reduce the risk of getting diabetes and even return to normal blood glucose levels. With modest weight loss and moderate physical  activity, you can delay or prevent type 2 diabetes.  PREVENTION You cannot do anything about race, age or family history, but you can lower your chances of getting diabetes. You can:   Exercise regularly and be active.  Reduce fat and calorie intake.  Make wise food choices as much as you can.  Reduce your intake of salt and alcohol.  Maintain a reasonable weight.  Keep blood pressure in an acceptable range. Take medication if needed.  Not smoke.  Maintain an acceptable cholesterol level (HDL, LDL, Triglycerides). Take medication if needed. DOING MY PART: GETTING STARTED Making big changes in your life is hard, especially if you are faced with more than one change. You can make it easier by taking these steps:  Make a plan to change behavior.  Decide exactly what you will do and when you will do it.  Plan what you need to get ready.  Think about what might prevent you from reaching your goals.  Find family and friends who will support and encourage you.  Decide how you will reward yourself when you do what you have planned.  Your doctor, dietitian, or counselor can help you make a plan. HERE ARE SOME OF THE AREAS YOU MAY WISH TO CHANGE TO REDUCE YOUR RISK OF DIABETES. If you are overweight or obese, choose sensible ways to get in shape. Even small amounts of weight loss, like 5-10 pounds, can help reduce the effects of insulin resistance and help blood glucose control. Diet  Avoid crash diets. Instead, eat less of the foods you usually have. Limit the amount of fat you eat.  Increase your physical activity. Aim for at least 30 minutes of exercise most days of the week.  Set a reasonable weight-loss goal, such as losing 1 pound a week. Aim for a long-term goal of losing 5-7% of your total body weight.  Make wise food choices most of the time.  What you eat has a big impact on your health. By making wise food choices, you can help control your body weight, blood pressure,  and cholesterol.  Take a hard look at the serving sizes of the foods you eat. Reduce serving sizes of meat, desserts, and foods high in fat. Increase your intake of fruits and vegetables.  Limit your fat intake to about 25% of your total calories. For example, if your food choices add up to about 2,000 calories a day, try to eat no more than 56 grams of fat. Your caregiver or a dietitian can help you figure out how much fat to have. You can check food labels for fat content too.  You may also want to reduce the number of calories you have each day.  Keep a food log. Write down what you eat, how much you eat, and anything else that helps keep you on track.  When you meet your goal, reward  yourself with a nonfood item or activity. Exercise  Be physically active every day.  Keep and exercise log. Write down what exercise you did, for how long, and anything else that keeps you on track.  Regular exercise (like brisk walking) tackles several risk factors at once. It helps you lose weight, it keeps your cholesterol and blood pressure under control, and it helps your body use insulin. People who are physically active for 30 minutes a day, 5 days a week, reduced their risk of type 2 diabetes. If you are not very active, you should start slowly at first. Talk with your caregiver first about what kinds of exercise would be safe for you. Make a plan to increase your activity level with the goal of being active for at least 30 minutes a day, most days of the week.  Choose activities you enjoy. Here are some ways to work extra activity into your daily routine:  Take the stairs rather than an elevator or escalator.  Park at the far end of the lot and walk.  Get off the bus a few stops early and walk the rest of the way.  Walk or bicycle instead of drive whenever you can. Medications Some people need medication to help control their blood pressure or cholesterol levels. If you do, take your medicines  as directed. Ask your caregiver whether there are any medicines you can take to prevent type 2 diabetes. Document Released: 09/28/2003 Document Revised: 12/18/2011 Document Reviewed: 06/23/2009 [facility] Patient Information 2014 [place].

## 2014-04-17 ENCOUNTER — Encounter: Payer: Self-pay | Admitting: Emergency Medicine

## 2014-06-09 ENCOUNTER — Encounter: Payer: Self-pay | Admitting: Gastroenterology

## 2014-06-16 ENCOUNTER — Ambulatory Visit (INDEPENDENT_AMBULATORY_CARE_PROVIDER_SITE_OTHER): Payer: BC Managed Care – PPO | Admitting: Emergency Medicine

## 2014-06-16 VITALS — BP 130/75 | HR 54 | Temp 97.7°F | Resp 16 | Ht 62.5 in | Wt 245.0 lb

## 2014-06-16 DIAGNOSIS — Z23 Encounter for immunization: Secondary | ICD-10-CM | POA: Diagnosis not present

## 2014-06-16 DIAGNOSIS — M545 Low back pain, unspecified: Secondary | ICD-10-CM

## 2014-06-16 DIAGNOSIS — R7309 Other abnormal glucose: Secondary | ICD-10-CM

## 2014-06-16 DIAGNOSIS — I10 Essential (primary) hypertension: Secondary | ICD-10-CM

## 2014-06-16 DIAGNOSIS — E785 Hyperlipidemia, unspecified: Secondary | ICD-10-CM | POA: Diagnosis not present

## 2014-06-16 DIAGNOSIS — R739 Hyperglycemia, unspecified: Secondary | ICD-10-CM

## 2014-06-16 DIAGNOSIS — J3089 Other allergic rhinitis: Secondary | ICD-10-CM

## 2014-06-16 LAB — LIPID PANEL
CHOL/HDL RATIO: 2.5 ratio
Cholesterol: 119 mg/dL (ref 0–200)
HDL: 48 mg/dL (ref >39)
LDL Cholesterol: 50 mg/dL (ref 0–99)
TRIGLYCERIDES: 106 mg/dL (ref <150)
VLDL: 21 mg/dL (ref 0–40)

## 2014-06-16 LAB — CBC WITH DIFFERENTIAL/PLATELET
Basophils Absolute: 0 10*3/uL (ref 0.0–0.1)
Basophils Relative: 1 % (ref 0–1)
Eosinophils Absolute: 0.3 10*3/uL (ref 0.0–0.7)
Eosinophils Relative: 6 % — ABNORMAL HIGH (ref 0–5)
HEMATOCRIT: 40.1 % (ref 36.0–46.0)
HEMOGLOBIN: 13.7 g/dL (ref 12.0–15.0)
Lymphocytes Relative: 35 % (ref 12–46)
Lymphs Abs: 1.7 10*3/uL (ref 0.7–4.0)
MCH: 29.3 pg (ref 26.0–34.0)
MCHC: 34.2 g/dL (ref 30.0–36.0)
MCV: 85.7 fL (ref 78.0–100.0)
MONO ABS: 0.6 10*3/uL (ref 0.1–1.0)
MONOS PCT: 13 % — AB (ref 3–12)
NEUTROS ABS: 2.2 10*3/uL (ref 1.7–7.7)
Neutrophils Relative %: 45 % (ref 43–77)
Platelets: 227 10*3/uL (ref 150–400)
RBC: 4.68 MIL/uL (ref 3.87–5.11)
RDW: 15.2 % (ref 11.5–15.5)
WBC: 4.8 10*3/uL (ref 4.0–10.5)

## 2014-06-16 LAB — COMPLETE METABOLIC PANEL WITH GFR
ALK PHOS: 73 U/L (ref 39–117)
ALT: 14 U/L (ref 0–35)
AST: 21 U/L (ref 0–37)
Albumin: 4.2 g/dL (ref 3.5–5.2)
BUN: 18 mg/dL (ref 6–23)
CO2: 29 mEq/L (ref 19–32)
CREATININE: 0.95 mg/dL (ref 0.50–1.10)
Calcium: 9.9 mg/dL (ref 8.4–10.5)
Chloride: 103 mEq/L (ref 96–112)
GFR, Est African American: 75 mL/min
GFR, Est Non African American: 65 mL/min
Glucose, Bld: 121 mg/dL — ABNORMAL HIGH (ref 70–99)
Potassium: 3.9 mEq/L (ref 3.5–5.3)
Sodium: 140 mEq/L (ref 135–145)
Total Bilirubin: 0.3 mg/dL (ref 0.2–1.2)
Total Protein: 6.7 g/dL (ref 6.0–8.3)

## 2014-06-16 LAB — POCT GLYCOSYLATED HEMOGLOBIN (HGB A1C): HEMOGLOBIN A1C: 6.3

## 2014-06-16 NOTE — Progress Notes (Signed)
Subjective:    Patient ID: [name], female    DOB: [birth year], 63 y.o.   MRN: [id number]  This chart was scribed for Megan Queen, MD by Erling Conte, Medical Scribe. This patient was seen in Room 22 and the patient's care was started at 8:53 AM.  Chief Complaint  Patient presents with  . Follow-up    3 month    HPI HPI Comments: [name] is a 63 y.o. female with a h/o HTN, hyperlipidemia, hyperglycemia, arthritis, reflux esophagitis, GERD, diverticulosis, obesity, and colonic polyps, who presents to the [facility] for a 3 month follow up for her BP, cholesterol and BS issues. Pt seems to be doing well. Sees an orthopedist for her bursitis in her shoulder. She states that taking steroids for the bursitis seem to make her BS elevate. Lately she reports noticing some pressure in her ears and HAs but believes this is due to sinus issues. Takes Zyrtec and Flonase for her sinus issues. Pt states these sinus HAs have been an ongoing issue since she was young and these HAs are not new. Pt also reports that her 4th finger on her left hand frequently feels like it pops out of joint. She denies any injury, swelling or color change to the area. She denies any other complaints at this time. Pt gets her annual mammograms and colonoscopies    Patient Active Problem List   Diagnosis Date Noted  . Unspecified essential hypertension 03/17/2014  . Other and unspecified hyperlipidemia 03/17/2014  . Hyperglycemia 03/17/2014  . Allergic rhinitis 09/16/2013  . Arthritis 07/02/2012  . S/P lumbar spinal fusion 07/02/2012  . REFLUX ESOPHAGITIS 12/28/2008  . GERD 12/28/2008  . DIVERTICULOSIS-COLON 12/28/2008  . Abdominal pain, epigastric 12/28/2008  . PERSONAL HX COLONIC POLYPS 12/28/2008   Past Medical History  Diagnosis Date  . Hypertension   . Allergy   . Arthritis   . Anemia   . Anxiety   . GERD (gastroesophageal reflux disease)   . Obesity   . DDD  (degenerative disc disease), lumbar   . Blood in stool    Past Surgical History  Procedure Laterality Date  . Spine surgery    . Abdominal hysterectomy    . Tonsillectomy and adenoidectomy     Allergies  Allergen Reactions  . Erythromycin     rash  . Lansoprazole   . Penicillins     rash  . Robaxin [Methocarbamol]     Messed up stomach  . Sulfonamide Derivatives     rash  . Trental [Pentoxifylline Er]     Bad headache   Prior to Admission medications   Medication Sig Start Date End Date Taking? Authorizing Provider  ALPRAZolam Duanne Moron) 0.5 MG tablet 1/2 to 1 daily prn 01/30/13  Yes [name], MD  atenolol (TENORMIN) 50 MG tablet Take 1 tablet (50 mg total) by mouth daily. 09/16/13  Yes [name], MD  azelastine (ASTELIN) 137 MCG/SPRAY nasal spray Place 2 sprays into both nostrils 2 (two) times daily. Use in each nostril as directed 09/16/13  Yes [name], MD  cyclobenzaprine (FLEXERIL) 10 MG tablet Take 10 mg by mouth 3 (three) times daily as needed.   Yes Historical Provider, MD  diclofenac sodium (VOLTAREN) 1 % GEL Apply topically 4 (four) times daily.   Yes Historical Provider, MD  dimenhyDRINATE (DRAMAMINE) 50 MG tablet Take 50 mg by mouth every 8 (eight) hours as needed for nausea.   Yes  Historical Provider, MD  EPINEPHrine (EPIPEN) 0.3 mg/0.3 mL DEVI Inject 0.3 mLs (0.3 mg total) into the muscle once. 01/30/13  Yes [name], MD  fluticasone (FLONASE) 50 MCG/ACT nasal spray USE TWO SPRAY IN EACH NOSTRIL EVERY DAY 09/16/13  Yes [name], MD  hydrochlorothiazide (HYDRODIURIL) 25 MG tablet Take 1 tablet (25 mg total) by mouth daily. 09/16/13  Yes [name], MD  HYDROcodone-acetaminophen (VICODIN) 5-500 MG per tablet Take 1 tablet by mouth every 6 (six) hours as needed. 325/10 mg   Yes Historical Provider, MD  hydrocortisone 2.5 % ointment APPLY TWICE A DAY 09/16/13  Yes [name], MD  Multiple Vitamins-Minerals (MULTI-VITAMIN GUMMIES PO) Take by mouth as  needed.   Yes Historical Provider, MD  omeprazole (PRILOSEC) 20 MG capsule Take 20 mg by mouth 2 (two) times daily.   Yes Historical Provider, MD  ondansetron (ZOFRAN-ODT) 8 MG disintegrating tablet Take 1 tablet (8 mg total) by mouth every 8 (eight) hours as needed for nausea. 11/22/13  Yes Roselee Culver, MD  rosuvastatin (CRESTOR) 10 MG tablet Take 1 tablet (10 mg total) by mouth daily. 09/16/13  Yes [name], MD   History   Social History  . Marital Status: Widowed    Spouse Name: N/A    Number of Children: N/A  . Years of Education: N/A   Occupational History  . Not on file.   Social History Main Topics  . Smoking status: Former Smoker -- 1.00 packs/day for 38 years    Types: Cigarettes    Quit date: 10/28/1997  . Smokeless tobacco: Never Used  . Alcohol Use: No  . Drug Use: No  . Sexual Activity: No   Other Topics Concern  . 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     Review of Systems  Constitutional: Negative for fatigue and unexpected weight change.  HENT: Positive for congestion, ear pain (pressure) and sinus pressure.   Respiratory: Negative for chest tightness and shortness of breath.   Cardiovascular: Negative for chest pain, palpitations and leg swelling.  Gastrointestinal: Negative for abdominal pain and blood in stool.  Musculoskeletal: Positive for arthralgias (4th finger of left hand). Negative for joint swelling.  Skin: Negative for color change.  Neurological: Positive for headaches. Negative for dizziness, syncope and light-headedness.  All other systems reviewed and are negative.      Objective:   Physical Exam CONSTITUTIONAL: Well developed/well nourished HEAD: Normocephalic/atraumatic EYES: EOMI/PERRL ENMT: Mucous membranes moist. Nasal congestion. NECK: supple no meningeal signs SPINE:entire spine nontender CV: S1/S2 noted, no murmurs/rubs/gallops noted LUNGS: Lungs are clear to  auscultation bilaterally, no apparent distress ABDOMEN: soft, nontender, no rebound or guarding GU:no cva tenderness NEURO: Pt is awake/alert, moves all extremitiesx4 EXTREMITIES: pulses normal, full ROM SKIN: warm, color normal PSYCH: no abnormalities of mood noted  Filed Vitals:   06/16/14 0820  BP: 130/75  Pulse: 54  Temp: 97.7 F (36.5 C)  Resp: 16  Height: 5' 2.5" (1.588 m)  Weight: 245 lb (111.131 kg)  SpO2: 96%   Results for orders placed in visit on 06/16/14  POCT GLYCOSYLATED HEMOGLOBIN (HGB A1C)      Result Value Ref Range   Hemoglobin A1C 6.3           Assessment & Plan:   Pt will continue to work on diet and weight loss. Her hemoglobin A1C is unchanged she will have physical in 4 months  and get pneumonia vaccine at that time. She was unable to afford shingles vaccine no other changes in medication at the present time. [facility] and also sees an orthopedist at that office for her joint problems..I personally performed the services described in this documentation, which was scribed in my presence. The recorded information has been reviewed and is accurate.

## 2014-09-19 ENCOUNTER — Telehealth: Payer: Self-pay

## 2014-09-19 DIAGNOSIS — I10 Essential (primary) hypertension: Secondary | ICD-10-CM

## 2014-09-19 NOTE — Telephone Encounter (Signed)
Pt is requesting a refill of tenormin. She is completely out!

## 2014-09-21 MED ORDER — ATENOLOL 50 MG PO TABS: 50.0000 mg | ORAL_TABLET | Freq: Every day | ORAL | Status: DC

## 2014-09-21 NOTE — Telephone Encounter (Signed)
Refill sent to pharmacy.   

## 2014-09-28 ENCOUNTER — Other Ambulatory Visit: Payer: Self-pay | Admitting: Emergency Medicine

## 2014-10-13 ENCOUNTER — Encounter: Payer: Self-pay | Admitting: Emergency Medicine

## 2014-10-13 ENCOUNTER — Ambulatory Visit (INDEPENDENT_AMBULATORY_CARE_PROVIDER_SITE_OTHER): Payer: BC Managed Care – PPO | Admitting: Emergency Medicine

## 2014-10-13 VITALS — BP 126/85 | HR 64 | Temp 97.9°F | Resp 16 | Ht 62.5 in | Wt 245.0 lb

## 2014-10-13 DIAGNOSIS — I10 Essential (primary) hypertension: Secondary | ICD-10-CM

## 2014-10-13 DIAGNOSIS — E785 Hyperlipidemia, unspecified: Secondary | ICD-10-CM

## 2014-10-13 DIAGNOSIS — E119 Type 2 diabetes mellitus without complications: Secondary | ICD-10-CM

## 2014-10-13 DIAGNOSIS — R635 Abnormal weight gain: Secondary | ICD-10-CM

## 2014-10-13 DIAGNOSIS — R739 Hyperglycemia, unspecified: Secondary | ICD-10-CM

## 2014-10-13 DIAGNOSIS — E1169 Type 2 diabetes mellitus with other specified complication: Secondary | ICD-10-CM | POA: Insufficient documentation

## 2014-10-13 DIAGNOSIS — Z0000 Encounter for general adult medical examination without abnormal findings: Secondary | ICD-10-CM

## 2014-10-13 LAB — LIPID PANEL
CHOL/HDL RATIO: 3 ratio
Cholesterol: 145 mg/dL (ref 0–200)
HDL: 49 mg/dL (ref >39)
LDL Cholesterol: 78 mg/dL (ref 0–99)
Triglycerides: 92 mg/dL (ref <150)
VLDL: 18 mg/dL (ref 0–40)

## 2014-10-13 LAB — CBC WITH DIFFERENTIAL/PLATELET
BASOS PCT: 1 % (ref 0–1)
Basophils Absolute: 0 10*3/uL (ref 0.0–0.1)
EOS ABS: 0.4 10*3/uL (ref 0.0–0.7)
EOS PCT: 8 % — AB (ref 0–5)
HEMATOCRIT: 40.5 % (ref 36.0–46.0)
HEMOGLOBIN: 14 g/dL (ref 12.0–15.0)
Lymphocytes Relative: 36 % (ref 12–46)
Lymphs Abs: 1.7 10*3/uL (ref 0.7–4.0)
MCH: 29.2 pg (ref 26.0–34.0)
MCHC: 34.6 g/dL (ref 30.0–36.0)
MCV: 84.6 fL (ref 78.0–100.0)
MONOS PCT: 11 % (ref 3–12)
MPV: 10.5 fL (ref 8.6–12.4)
Monocytes Absolute: 0.5 10*3/uL (ref 0.1–1.0)
NEUTROS PCT: 44 % (ref 43–77)
Neutro Abs: 2.1 10*3/uL (ref 1.7–7.7)
Platelets: 245 10*3/uL (ref 150–400)
RBC: 4.79 MIL/uL (ref 3.87–5.11)
RDW: 15 % (ref 11.5–15.5)
WBC: 4.7 10*3/uL (ref 4.0–10.5)

## 2014-10-13 LAB — COMPLETE METABOLIC PANEL WITH GFR
ALBUMIN: 4.2 g/dL (ref 3.5–5.2)
ALT: 17 U/L (ref 0–35)
AST: 26 U/L (ref 0–37)
Alkaline Phosphatase: 73 U/L (ref 39–117)
BILIRUBIN TOTAL: 0.4 mg/dL (ref 0.2–1.2)
BUN: 18 mg/dL (ref 6–23)
CO2: 29 meq/L (ref 19–32)
Calcium: 9.8 mg/dL (ref 8.4–10.5)
Chloride: 101 mEq/L (ref 96–112)
Creat: 0.82 mg/dL (ref 0.50–1.10)
GFR, EST AFRICAN AMERICAN: 89 mL/min
GFR, EST NON AFRICAN AMERICAN: 77 mL/min
GLUCOSE: 115 mg/dL — AB (ref 70–99)
Potassium: 4.1 mEq/L (ref 3.5–5.3)
SODIUM: 142 meq/L (ref 135–145)
TOTAL PROTEIN: 6.9 g/dL (ref 6.0–8.3)

## 2014-10-13 LAB — POCT URINALYSIS DIPSTICK
Bilirubin, UA: NEGATIVE
GLUCOSE UA: NEGATIVE
Ketones, UA: NEGATIVE
Leukocytes, UA: NEGATIVE
NITRITE UA: NEGATIVE
Protein, UA: NEGATIVE
Spec Grav, UA: 1.015
Urobilinogen, UA: 0.2
pH, UA: 6

## 2014-10-13 LAB — POCT GLYCOSYLATED HEMOGLOBIN (HGB A1C): Hemoglobin A1C: 6.4

## 2014-10-13 LAB — TSH: TSH: 2.056 u[IU]/mL (ref 0.350–4.500)

## 2014-10-13 MED ORDER — ZOSTER VACCINE LIVE 19400 UNT/0.65ML ~~LOC~~ SOLR: 0.6500 mL | Freq: Once | SUBCUTANEOUS | Status: DC

## 2014-10-13 NOTE — Progress Notes (Addendum)
Subjective:  This chart was scribed for Nena Jordan, MD by Dellis Filbert, ED Scribe at [facility].The patient was seen in exam room 21 and the patient's care was started at 9:57 AM.   Patient ID: [name], female    DOB: 1951-05-24, 64 y.o.   MRN: [id number] Chief Complaint  Patient presents with  . Annual Exam   HPI HPI Comments: [name] is a 64 y.o. female who presents to [facility] for an annual physical exam. Pt is complaint with all her medication. Dr. Rennis Harding is her orthopedist. Pt is utd on colonoscopy and mammogram, both were done in 2015. Pt has gotten the influenza vaccine but she has not gotten the pneumonia vaccine. She planned getting it today but is not currently feeling well, her furnace went out and she believes this is why she is not feeling well. She will get the shingles vaccine through [facility], she needs a prescription. Does not see her eye doctor one a year, but wants a referral. Pt will begin exercising and dieting. She has had a hysterectomy. Her mother has dementia, pt reports thyroid issues run in the family. Mother is 87 and her father died at 6.  Pt complains of intermittent right sided abdominal pain. The pain is not progressively worsening. She does have normal BM. The hydrocodone makes her constipated so she takes murelax for relief.   Patient Active Problem List   Diagnosis Date Noted  . Unspecified essential hypertension 03/17/2014  . Other and unspecified hyperlipidemia 03/17/2014  . Hyperglycemia 03/17/2014  . Allergic rhinitis 09/16/2013  . Arthritis 07/02/2012  . S/P lumbar spinal fusion 07/02/2012  . REFLUX ESOPHAGITIS 12/28/2008  . GERD 12/28/2008  . DIVERTICULOSIS-COLON 12/28/2008  . Abdominal pain, epigastric 12/28/2008  . PERSONAL HX COLONIC POLYPS 12/28/2008   Past Medical History  Diagnosis Date  . Hypertension   . Allergy   . Arthritis   . Anemia   . Anxiety   . GERD (gastroesophageal reflux disease)     . Obesity   . DDD (degenerative disc disease), lumbar   . Blood in stool    Past Surgical History  Procedure Laterality Date  . Spine surgery    . Abdominal hysterectomy    . Tonsillectomy and adenoidectomy     Allergies  Allergen Reactions  . Erythromycin     rash  . Lansoprazole   . Penicillins     rash  . Robaxin [Methocarbamol]     Messed up stomach  . Sulfonamide Derivatives     rash  . Trental [Pentoxifylline Er]     Bad headache   Prior to Admission medications   Medication Sig Start Date End Date Taking? Authorizing Provider  ALPRAZolam Duanne Moron) 0.5 MG tablet 1/2 to 1 daily prn 01/30/13  Yes [name], MD  atenolol (TENORMIN) 50 MG tablet Take 1 tablet (50 mg total) by mouth daily. 09/21/14  Yes Chelle S Jeffery, PA-C  azelastine (ASTELIN) 137 MCG/SPRAY nasal spray Place 2 sprays into both nostrils 2 (two) times daily. Use in each nostril as directed 09/16/13  Yes [name], MD  cyclobenzaprine (FLEXERIL) 10 MG tablet Take 10 mg by mouth 3 (three) times daily as needed.   Yes Historical Provider, MD  diclofenac sodium (VOLTAREN) 1 % GEL Apply topically 4 (four) times daily.   Yes Historical Provider, MD  dimenhyDRINATE (DRAMAMINE) 50 MG tablet Take 50 mg by mouth every 8 (eight) hours as needed for  nausea.   Yes Historical Provider, MD  EPINEPHrine (EPIPEN) 0.3 mg/0.3 mL DEVI Inject 0.3 mLs (0.3 mg total) into the muscle once. 01/30/13  Yes [name], MD  fluticasone (FLONASE) 50 MCG/ACT nasal spray USE TWO SPRAY IN EACH NOSTRIL EVERY DAY 09/16/13  Yes [name], MD  hydrochlorothiazide (HYDRODIURIL) 25 MG tablet Take 1 tablet (25 mg total) by mouth daily. 09/16/13  Yes [name], MD  HYDROcodone-acetaminophen (VICODIN) 5-500 MG per tablet Take 1 tablet by mouth every 6 (six) hours as needed. 325/10 mg   Yes Historical Provider, MD  hydrocortisone 2.5 % ointment APPLY TWICE A DAY 09/16/13  Yes [name], MD  meloxicam (MOBIC) 15 MG tablet Take 15 mg by  mouth daily.   Yes Historical Provider, MD  Multiple Vitamins-Minerals (MULTI-VITAMIN GUMMIES PO) Take by mouth as needed.   Yes Historical Provider, MD  omeprazole (PRILOSEC) 20 MG capsule Take 20 mg by mouth 2 (two) times daily.   Yes Historical Provider, MD  rosuvastatin (CRESTOR) 10 MG tablet Take 1 tablet (10 mg total) by mouth daily. 09/16/13  Yes [name], MD   History   Social History  . Marital Status: Widowed    Spouse Name: N/A    Number of Children: N/A  . Years of Education: N/A   Occupational History  . Not on file.   Social History Main Topics  . Smoking status: Former Smoker -- 1.00 packs/day for 38 years    Types: Cigarettes    Quit date: 10/28/1997  . Smokeless tobacco: Never Used  . Alcohol Use: No  . Drug Use: No  . Sexual Activity: No   Other Topics Concern  . 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     Review of Systems  Constitutional: Positive for diaphoresis and fatigue.  HENT: Positive for ear pain, facial swelling, nosebleeds, postnasal drip, rhinorrhea, sinus pressure, sneezing, sore throat and voice change.   Eyes: Positive for photophobia.  Respiratory: Positive for stridor.   Cardiovascular: Positive for leg swelling.  Gastrointestinal: Positive for nausea, abdominal pain, diarrhea and constipation.  Endocrine: Positive for cold intolerance and heat intolerance.  Musculoskeletal: Positive for myalgias, back pain, joint swelling, arthralgias, gait problem, neck pain and neck stiffness.  Skin: Positive for rash.  Allergic/Immunologic: Positive for environmental allergies and food allergies.  Neurological: Positive for weakness and headaches.  Psychiatric/Behavioral: Positive for sleep disturbance. The patient is nervous/anxious.   All other systems reviewed and are negative.      Objective:  BP 126/85 mmHg  Pulse 64  Temp(Src) 97.9 F (36.6 C)  Resp 16  Ht 5' 2.5" (1.588 m)  Wt 245 lb  (111.131 kg)  BMI 44.07 kg/m2  SpO2 97%  Physical Exam  Constitutional: She is oriented to person, place, and time. She appears well-developed and well-nourished. No distress.  Very obese  HENT:  Head: Normocephalic and atraumatic.  Eyes: Conjunctivae and EOM are normal. Pupils are equal, round, and reactive to light.  Neck: Normal range of motion. Neck supple.  Cardiovascular: Normal rate, regular rhythm and normal heart sounds.   Pulmonary/Chest: Effort normal and breath sounds normal.  Abdominal: Soft. She exhibits no mass. There is no tenderness.  Musculoskeletal: Normal range of motion. She exhibits no edema.  Neurological: She is alert and oriented to person, place, and time. No cranial nerve deficit. She exhibits normal muscle tone. Coordination normal.  Skin: Skin is warm  and dry.  Psychiatric: She has a normal mood and affect. Her behavior is normal.  Nursing note and vitals reviewed.  Results for orders placed or performed in visit on 06/16/14  CBC with Differential  Result Value Ref Range   WBC 4.8 4.0 - 10.5 K/uL   RBC 4.68 3.87 - 5.11 MIL/uL   Hemoglobin 13.7 12.0 - 15.0 g/dL   HCT 40.1 36.0 - 46.0 %   MCV 85.7 78.0 - 100.0 fL   MCH 29.3 26.0 - 34.0 pg   MCHC 34.2 30.0 - 36.0 g/dL   RDW 15.2 11.5 - 15.5 %   Platelets 227 150 - 400 K/uL   Neutrophils Relative % 45 43 - 77 %   Neutro Abs 2.2 1.7 - 7.7 K/uL   Lymphocytes Relative 35 12 - 46 %   Lymphs Abs 1.7 0.7 - 4.0 K/uL   Monocytes Relative 13 (H) 3 - 12 %   Monocytes Absolute 0.6 0.1 - 1.0 K/uL   Eosinophils Relative 6 (H) 0 - 5 %   Eosinophils Absolute 0.3 0.0 - 0.7 K/uL   Basophils Relative 1 0 - 1 %   Basophils Absolute 0.0 0.0 - 0.1 K/uL   Smear Review Criteria for review not met   Lipid panel  Result Value Ref Range   Cholesterol 119 0 - 200 mg/dL   Triglycerides 106 <150 mg/dL   HDL 48 >39 mg/dL   Total CHOL/HDL Ratio 2.5 Ratio   VLDL 21 0 - 40 mg/dL   LDL Cholesterol 50 0 - 99 mg/dL  COMPLETE  METABOLIC PANEL WITH GFR  Result Value Ref Range   Sodium 140 135 - 145 mEq/L   Potassium 3.9 3.5 - 5.3 mEq/L   Chloride 103 96 - 112 mEq/L   CO2 29 19 - 32 mEq/L   Glucose, Bld 121 (H) 70 - 99 mg/dL   BUN 18 6 - 23 mg/dL   Creat 0.95 0.50 - 1.10 mg/dL   Total Bilirubin 0.3 0.2 - 1.2 mg/dL   Alkaline Phosphatase 73 39 - 117 U/L   AST 21 0 - 37 U/L   ALT 14 0 - 35 U/L   Total Protein 6.7 6.0 - 8.3 g/dL   Albumin 4.2 3.5 - 5.2 g/dL   Calcium 9.9 8.4 - 10.5 mg/dL   GFR, Est African American 75 mL/min   GFR, Est Non African American 65 mL/min  POCT glycosylated hemoglobin (Hb A1C)  Result Value Ref Range   Hemoglobin A1C 6.3   EKG sinus bradycardia rate 53    Assessment & Plan:  Referral made to Dr. Gershon Crane ophthalmologist for eye check. She is encouraged to work on weight loss diet and exercise. She has already had her flu shot. She was given a prescription for shingles vaccine. Recheck 4 months. She does not want to start a medication for her diabetes. She states she will go to plan of fitness and work out on a regular basis and recheck in 3-4 months. If her sugars still elevated at that time I will again try and start her on metformin.I personally performed the services described in this documentation, which was scribed in my presence. The recorded information has been reviewed and is accurate.

## 2014-10-14 ENCOUNTER — Other Ambulatory Visit: Payer: Self-pay | Admitting: Emergency Medicine

## 2014-10-24 ENCOUNTER — Encounter: Payer: Self-pay | Admitting: *Deleted

## 2014-12-10 ENCOUNTER — Other Ambulatory Visit: Payer: Self-pay | Admitting: Emergency Medicine

## 2014-12-11 ENCOUNTER — Other Ambulatory Visit: Payer: Self-pay | Admitting: Emergency Medicine

## 2014-12-13 ENCOUNTER — Other Ambulatory Visit: Payer: Self-pay | Admitting: Emergency Medicine

## 2015-01-20 ENCOUNTER — Other Ambulatory Visit (HOSPITAL_COMMUNITY): Payer: Self-pay | Admitting: Orthopaedic Surgery

## 2015-01-20 DIAGNOSIS — M5106 Intervertebral disc disorders with myelopathy, lumbar region: Secondary | ICD-10-CM

## 2015-01-20 DIAGNOSIS — M5416 Radiculopathy, lumbar region: Secondary | ICD-10-CM

## 2015-02-09 ENCOUNTER — Ambulatory Visit (INDEPENDENT_AMBULATORY_CARE_PROVIDER_SITE_OTHER): Payer: BC Managed Care – PPO | Admitting: Emergency Medicine

## 2015-02-09 ENCOUNTER — Encounter: Payer: Self-pay | Admitting: Emergency Medicine

## 2015-02-09 ENCOUNTER — Ambulatory Visit: Payer: BC Managed Care – PPO | Admitting: Emergency Medicine

## 2015-02-09 VITALS — BP 131/78 | HR 54 | Temp 98.3°F | Resp 16 | Ht 62.5 in | Wt 243.0 lb

## 2015-02-09 DIAGNOSIS — G8929 Other chronic pain: Secondary | ICD-10-CM | POA: Diagnosis not present

## 2015-02-09 DIAGNOSIS — R1010 Upper abdominal pain, unspecified: Secondary | ICD-10-CM

## 2015-02-09 DIAGNOSIS — E785 Hyperlipidemia, unspecified: Secondary | ICD-10-CM | POA: Diagnosis not present

## 2015-02-09 DIAGNOSIS — I10 Essential (primary) hypertension: Secondary | ICD-10-CM

## 2015-02-09 DIAGNOSIS — R112 Nausea with vomiting, unspecified: Secondary | ICD-10-CM

## 2015-02-09 DIAGNOSIS — R1011 Right upper quadrant pain: Secondary | ICD-10-CM

## 2015-02-09 DIAGNOSIS — R635 Abnormal weight gain: Secondary | ICD-10-CM | POA: Diagnosis not present

## 2015-02-09 DIAGNOSIS — R739 Hyperglycemia, unspecified: Secondary | ICD-10-CM

## 2015-02-09 LAB — POCT UA - MICROSCOPIC ONLY
Bacteria, U Microscopic: NEGATIVE
Casts, Ur, LPF, POC: NEGATIVE
Crystals, Ur, HPF, POC: NEGATIVE
Mucus, UA: NEGATIVE
RBC, urine, microscopic: NEGATIVE
WBC, Ur, HPF, POC: NEGATIVE
Yeast, UA: NEGATIVE

## 2015-02-09 LAB — POCT URINALYSIS DIPSTICK
Bilirubin, UA: NEGATIVE
GLUCOSE UA: NEGATIVE
Ketones, UA: NEGATIVE
Leukocytes, UA: NEGATIVE
Nitrite, UA: NEGATIVE
Protein, UA: NEGATIVE
RBC UA: NEGATIVE
Spec Grav, UA: 1.015
UROBILINOGEN UA: 0.2
pH, UA: 7.5

## 2015-02-09 LAB — CBC WITH DIFFERENTIAL/PLATELET
Basophils Absolute: 0 10*3/uL (ref 0.0–0.1)
Basophils Relative: 1 % (ref 0–1)
EOS PCT: 6 % — AB (ref 0–5)
Eosinophils Absolute: 0.3 10*3/uL (ref 0.0–0.7)
HEMATOCRIT: 42.5 % (ref 36.0–46.0)
HEMOGLOBIN: 14.6 g/dL (ref 12.0–15.0)
LYMPHS ABS: 1.3 10*3/uL (ref 0.7–4.0)
LYMPHS PCT: 28 % (ref 12–46)
MCH: 29.4 pg (ref 26.0–34.0)
MCHC: 34.4 g/dL (ref 30.0–36.0)
MCV: 85.7 fL (ref 78.0–100.0)
MONO ABS: 0.6 10*3/uL (ref 0.1–1.0)
MPV: 10.8 fL (ref 8.6–12.4)
Monocytes Relative: 12 % (ref 3–12)
Neutro Abs: 2.5 10*3/uL (ref 1.7–7.7)
Neutrophils Relative %: 53 % (ref 43–77)
Platelets: 232 10*3/uL (ref 150–400)
RBC: 4.96 MIL/uL (ref 3.87–5.11)
RDW: 15.3 % (ref 11.5–15.5)
WBC: 4.7 10*3/uL (ref 4.0–10.5)

## 2015-02-09 LAB — COMPLETE METABOLIC PANEL WITH GFR
ALT: 21 U/L (ref 0–35)
AST: 27 U/L (ref 0–37)
Albumin: 4.4 g/dL (ref 3.5–5.2)
Alkaline Phosphatase: 65 U/L (ref 39–117)
BILIRUBIN TOTAL: 0.3 mg/dL (ref 0.2–1.2)
BUN: 11 mg/dL (ref 6–23)
CO2: 25 meq/L (ref 19–32)
Calcium: 9.8 mg/dL (ref 8.4–10.5)
Chloride: 102 mEq/L (ref 96–112)
Creat: 0.85 mg/dL (ref 0.50–1.10)
GFR, EST AFRICAN AMERICAN: 86 mL/min
GFR, EST NON AFRICAN AMERICAN: 74 mL/min
GLUCOSE: 119 mg/dL — AB (ref 70–99)
Potassium: 4.1 mEq/L (ref 3.5–5.3)
Sodium: 140 mEq/L (ref 135–145)
TOTAL PROTEIN: 7.2 g/dL (ref 6.0–8.3)

## 2015-02-09 LAB — AMYLASE: Amylase: 42 U/L (ref 0–105)

## 2015-02-09 LAB — LIPASE: LIPASE: 18 U/L (ref 0–75)

## 2015-02-09 NOTE — Progress Notes (Signed)
Subjective:  This chart was scribed for Megan Russian, MD by Ladene Artist, ED Scribe. The patient was seen in room 21. Patient's care was started at 10:11 AM.   Patient ID: [name], female    DOB: [birth year], 64 y.o.   MRN: [id number]  Chief Complaint  Patient presents with  . Follow-up  . Hypertension  . Back Pain    wants kidney and liver checked, thinks Crestor caused problems.  . Nausea   HPI  HPI Comments: [name] is a 64 y.o. female, with a h/o HTN, anemia, arthritis, GERD, anxiety, who presents to the [facility] for a HTN follow-up.   Crestor Pt reports gradually worsening generalized body aches that she attributes to Crestor which she started on 09/16/13. She speically reports back pain and arm pain that is exacerbated with lifting her arms. She also reports dark colored urine for a while that she describes as "tea colored" which she also attributes to Crestor. She states that her urine has been intermittently dark for a while but persistent recently. Pt stopped Crestor 3 days ago on 02/06/15. Pt's colonoscopy is UTD. She denies complications with her gallbladder. Pt's mother has had a cholecystectomy.   Past Medical History  Diagnosis Date  . Hypertension   . Allergy   . Arthritis   . Anemia   . Anxiety   . GERD (gastroesophageal reflux disease)   . Obesity   . DDD (degenerative disc disease), lumbar   . Blood in stool    Current Outpatient Prescriptions on File Prior to Visit  Medication Sig Dispense Refill  . ALPRAZolam (XANAX) 0.5 MG tablet 1/2 to 1 daily prn 20 tablet 1  . atenolol (TENORMIN) 50 MG tablet Take 1 tablet (50 mg total) by mouth daily. 30 tablet 5  . azelastine (ASTELIN) 137 MCG/SPRAY nasal spray Place 2 sprays into both nostrils 2 (two) times daily. Use in each nostril as directed 30 mL 12  . cyclobenzaprine (FLEXERIL) 10 MG tablet Take 10 mg by mouth 3 (three) times daily as needed.    . diclofenac sodium  (VOLTAREN) 1 % GEL Apply topically 4 (four) times daily.    Marland Kitchen dimenhyDRINATE (DRAMAMINE) 50 MG tablet Take 50 mg by mouth every 8 (eight) hours as needed for nausea.    Marland Kitchen EPINEPHrine (EPIPEN) 0.3 mg/0.3 mL DEVI Inject 0.3 mLs (0.3 mg total) into the muscle once. 1 Device 2  . fluticasone (FLONASE) 50 MCG/ACT nasal spray USE TWO SPRAY(S) IN EACH NOSTRIL ONCE DAILY. 16 g 1  . hydrochlorothiazide (HYDRODIURIL) 25 MG tablet TAKE ONE TABLET BY MOUTH ONCE DAILY 30 tablet 4  . HYDROcodone-acetaminophen (VICODIN) 5-500 MG per tablet Take 1 tablet by mouth every 6 (six) hours as needed. 325/10 mg    . hydrocortisone 2.5 % ointment APPLY TWICE A DAY 60 g 11  . meloxicam (MOBIC) 15 MG tablet Take 15 mg by mouth daily.    . Multiple Vitamins-Minerals (MULTI-VITAMIN GUMMIES PO) Take by mouth as needed.    Marland Kitchen omeprazole (PRILOSEC) 20 MG capsule Take 20 mg by mouth 2 (two) times daily.     No current facility-administered medications on file prior to visit.   Allergies  Allergen Reactions  . Crestor [Rosuvastatin]   . [name]     rash  . [name]   . [name]     rash  . [name]   . [name] [[name]]     Messed up stomach  . [name]  Derivatives     rash  . [name] [[name] Er]     Bad headache   Review of Systems  Gastrointestinal: Positive for nausea.  Musculoskeletal: Positive for myalgias and back pain.   BP 131/78 mmHg  Pulse 54  Temp(Src) 98.3 F (36.8 C)  Resp 16  Ht 5' 2.5" (1.588 m)  Wt 243 lb (110.224 kg)  BMI 43.71 kg/m2  SpO2 96%    Objective:   Physical Exam CONSTITUTIONAL: Well developed/well nourished HEAD: Normocephalic/atraumatic EYES: EOMI/PERRL ENMT: Mucous membranes moist NECK: supple no meningeal signs SPINE/BACK:entire spine nontender CV: S1/S2 noted, no murmurs/rubs/gallops noted LUNGS: Lungs are clear to auscultation bilaterally, no apparent distress ABDOMEN: soft, no rebound or guarding, bowel sounds noted throughout  abdomen, mild RUQ abdominal discomfort  GU:no cva tenderness NEURO: Pt is awake/alert/appropriate, moves all extremitiesx4.  No facial droop.   EXTREMITIES: pulses normal/equal, full ROM SKIN: warm, color normal PSYCH: no abnormalities of mood noted, alert and oriented to situation    Results for orders placed or performed in visit on 02/09/15  POCT urinalysis dipstick  Result Value Ref Range   Color, UA yellow    Clarity, UA clear    Glucose, UA neg    Bilirubin, UA neg    Ketones, UA neg    Spec Grav, UA 1.015    Blood, UA neg    pH, UA 7.5    Protein, UA neg    Urobilinogen, UA 0.2    Nitrite, UA neg    Leukocytes, UA Negative   POCT UA - Microscopic Only  Result Value Ref Range   WBC, Ur, HPF, POC neg    RBC, urine, microscopic neg    Bacteria, U Microscopic neg    Mucus, UA neg    Epithelial cells, urine per micros 0-2    Crystals, Ur, HPF, POC neg    Casts, Ur, LPF, POC neg    Yeast, UA neg    Assessment & Plan:   1. Essential hypertension  - POCT urinalysis dipstick - POCT UA - Microscopic Only - CBC with Differential/Platelet  2. Hyperglycemia  check glucose and Cmet  3. Hyperlipemia   4. Weight gain  work on diet and exercise.  5. Non-intractable vomiting with nausea, vomiting of unspecified type  - US Abdomen Complete; Future  6. Abdominal pain, chronic, right upper quadrant  - COMPLETE METABOLIC PANEL WITH GFR - Amylase - Lipase - US Abdomen Complete; Future  I am not convinced the Crestor had anything to do with her complaints  I personally performed the services described in this documentation, which was scribed in my presence. The recorded information has been reviewed and is accurate.  Megan Queen, MD  [facility], [facility]  02/09/2015 2:59 PM

## 2015-02-17 ENCOUNTER — Ambulatory Visit
Admission: RE | Admit: 2015-02-17 | Discharge: 2015-02-17 | Disposition: A | Discharge status: Home / Self Care | Payer: BC Managed Care – PPO | Source: Ambulatory Visit | Attending: Emergency Medicine | Admitting: Emergency Medicine

## 2015-02-17 DIAGNOSIS — G8929 Other chronic pain: Secondary | ICD-10-CM

## 2015-02-17 DIAGNOSIS — R112 Nausea with vomiting, unspecified: Secondary | ICD-10-CM

## 2015-02-17 DIAGNOSIS — R1011 Right upper quadrant pain: Secondary | ICD-10-CM

## 2015-02-19 ENCOUNTER — Other Ambulatory Visit (HOSPITAL_COMMUNITY): Payer: Self-pay | Admitting: *Deleted

## 2015-02-19 NOTE — Pre-Procedure Instructions (Addendum)
KRYSTEL FLETCHALL  02/19/2015   Your procedure is scheduled on:  Thursday, Feb 25, 2015 at 7:45 AM.   Report to [facility] Entrance "A" Admitting Office at 5:45 AM.   Call this number if you have problems the morning of surgery: [phone]               Any questions prior to day of surgery, please call [phone] between 8 & 4 PM.   Remember:   Do not eat food or drink liquids after midnight Wednesday, 02/24/15.   Take these medicines the morning of surgery with A SIP OF WATER: Atenolol (Tenormin), Omeprazole (Prilosec),  Astelin nasal spray,  Flonase nasal spray, Alprazolam (Xanax) - if needed, Hydrocodone - if needed   Stop Vitamins as of today.   Do not wear jewelry, make-up or nail polish.  Do not wear lotions, powders, or perfumes. You may wear deodorant.  Do not shave 48 hours prior to surgery.   Do not bring valuables to the hospital.  [facility] is not responsible                  for any belongings or valuables.               Contacts, dentures or bridgework may not be worn into surgery.  Leave suitcase in the car. After surgery it may be brought to your room.  For patients admitted to the hospital, discharge time is determined by your                treatment team.               Patients discharged the day of surgery will not be allowed to drive home.    Special Instructions: See "Preparing for Surgery" Instruction sheet.    Please read over the following fact sheets that you were given: Pain Booklet, Coughing and Deep Breathing and Surgical Site Infection Prevention

## 2015-02-22 ENCOUNTER — Encounter (HOSPITAL_COMMUNITY): Payer: Self-pay

## 2015-02-22 ENCOUNTER — Encounter (HOSPITAL_COMMUNITY)
Admission: RE | Admit: 2015-02-22 | Discharge: 2015-02-22 | Disposition: A | Discharge status: Home / Self Care | Payer: Worker's Compensation | Source: Ambulatory Visit | Attending: Orthopaedic Surgery | Admitting: Orthopaedic Surgery

## 2015-02-22 DIAGNOSIS — Z01812 Encounter for preprocedural laboratory examination: Secondary | ICD-10-CM

## 2015-02-22 DIAGNOSIS — M545 Low back pain: Secondary | ICD-10-CM | POA: Diagnosis present

## 2015-02-22 DIAGNOSIS — M5410 Radiculopathy, site unspecified: Secondary | ICD-10-CM | POA: Diagnosis not present

## 2015-02-22 DIAGNOSIS — M5416 Radiculopathy, lumbar region: Secondary | ICD-10-CM

## 2015-02-22 HISTORY — DX: Headache, unspecified: R51.9

## 2015-02-22 HISTORY — DX: Headache: R51

## 2015-02-22 LAB — BASIC METABOLIC PANEL
ANION GAP: 11 (ref 5–15)
BUN: 19 mg/dL (ref 6–20)
CO2: 27 mmol/L (ref 22–32)
Calcium: 9.8 mg/dL (ref 8.9–10.3)
Chloride: 101 mmol/L (ref 101–111)
Creatinine, Ser: 0.98 mg/dL (ref 0.44–1.00)
Glucose, Bld: 117 mg/dL — ABNORMAL HIGH (ref 65–99)
POTASSIUM: 3.3 mmol/L — AB (ref 3.5–5.1)
SODIUM: 139 mmol/L (ref 135–145)

## 2015-02-22 LAB — CBC
HEMATOCRIT: 40.1 % (ref 36.0–46.0)
HEMOGLOBIN: 13.6 g/dL (ref 12.0–15.0)
MCH: 29.5 pg (ref 26.0–34.0)
MCHC: 33.9 g/dL (ref 30.0–36.0)
MCV: 87 fL (ref 78.0–100.0)
PLATELETS: 209 10*3/uL (ref 150–400)
RBC: 4.61 MIL/uL (ref 3.87–5.11)
RDW: 14.7 % (ref 11.5–15.5)
WBC: 5.5 10*3/uL (ref 4.0–10.5)

## 2015-02-22 NOTE — Progress Notes (Signed)
PCP: Dr. Everlene Farrier  No cardiologist. States had echo >5 yrs. Ago: normal

## 2015-02-22 NOTE — Progress Notes (Signed)
   02/22/15 0923  OBSTRUCTIVE SLEEP APNEA  Have you ever been diagnosed with sleep apnea through a sleep study? No  Do you snore loudly (loud enough to be heard through closed doors)?  0  Do you often feel tired, fatigued, or sleepy during the daytime? 0  Has anyone observed you stop breathing during your sleep? 0  Do you have, or are you being treated for high blood pressure? 1  BMI more than 35 kg/m2? 1  Age over 62 years old? 1  Neck circumference greater than 40 cm/16 inches? 1  Gender: 0

## 2015-02-23 ENCOUNTER — Ambulatory Visit: Payer: Self-pay | Admitting: Emergency Medicine

## 2015-02-25 ENCOUNTER — Ambulatory Visit (HOSPITAL_COMMUNITY)
Admission: RE | Admit: 2015-02-25 | Discharge: 2015-02-25 | Disposition: A | Discharge status: Home / Self Care | Payer: Worker's Compensation | Source: Ambulatory Visit | Attending: Orthopaedic Surgery | Admitting: Orthopaedic Surgery

## 2015-02-25 ENCOUNTER — Ambulatory Visit (HOSPITAL_COMMUNITY): Payer: Worker's Compensation | Admitting: Certified Registered"

## 2015-02-25 ENCOUNTER — Encounter (HOSPITAL_COMMUNITY)
Admission: RE | Disposition: A | Discharge status: Home / Self Care | Payer: Self-pay | Source: Ambulatory Visit | Attending: Orthopaedic Surgery

## 2015-02-25 ENCOUNTER — Encounter (HOSPITAL_COMMUNITY): Payer: Self-pay | Admitting: *Deleted

## 2015-02-25 DIAGNOSIS — M5136 Other intervertebral disc degeneration, lumbar region: Secondary | ICD-10-CM | POA: Diagnosis not present

## 2015-02-25 DIAGNOSIS — M5416 Radiculopathy, lumbar region: Secondary | ICD-10-CM

## 2015-02-25 DIAGNOSIS — M5410 Radiculopathy, site unspecified: Secondary | ICD-10-CM | POA: Insufficient documentation

## 2015-02-25 DIAGNOSIS — M549 Dorsalgia, unspecified: Secondary | ICD-10-CM | POA: Diagnosis not present

## 2015-02-25 DIAGNOSIS — D649 Anemia, unspecified: Secondary | ICD-10-CM | POA: Diagnosis not present

## 2015-02-25 DIAGNOSIS — M5106 Intervertebral disc disorders with myelopathy, lumbar region: Secondary | ICD-10-CM

## 2015-02-25 HISTORY — PX: RADIOLOGY WITH ANESTHESIA: SHX6223

## 2015-02-25 LAB — GLUCOSE, CAPILLARY: Glucose-Capillary: 113 mg/dL — ABNORMAL HIGH (ref 65–99)

## 2015-02-25 SURGERY — RADIOLOGY WITH ANESTHESIA
Anesthesia: General

## 2015-02-25 MED ORDER — FENTANYL CITRATE (PF) 100 MCG/2ML IJ SOLN: 25.0000 ug | INTRAMUSCULAR | Status: DC | PRN

## 2015-02-25 MED ORDER — PROMETHAZINE HCL 25 MG/ML IJ SOLN: 6.2500 mg | INTRAMUSCULAR | Status: DC | PRN

## 2015-02-25 NOTE — Anesthesia Postprocedure Evaluation (Signed)
  Anesthesia Post-op Note  Patient: [name]  Procedure(s) Performed: Procedure(s) with comments: MRI OF LUMBAR SPINE (N/A) - DR. MAX COHEN/MRI  Patient Location: PACU  Anesthesia Type:General  Level of Consciousness: awake  Airway and Oxygen Therapy: Patient Spontanous Breathing  Post-op Pain: mild  Post-op Assessment: Post-op Vital signs reviewed  Post-op Vital Signs: Reviewed  Last Vitals:  Filed Vitals:   02/25/15 0945  BP: 111/52  Pulse: 55  Temp: 36.7 C  Resp: 17    Complications: No apparent anesthesia complications

## 2015-02-25 NOTE — Anesthesia Preprocedure Evaluation (Addendum)
Anesthesia Evaluation  Patient identified by MRN, date of birth, ID band  Reviewed: Allergy & Precautions, NPO status , Patient's Chart, lab work & pertinent test results  Airway Mallampati: II  TM Distance: >3 FB Neck ROM: Full    Dental   Pulmonary former smoker,  breath sounds clear to auscultation        Cardiovascular hypertension, Rhythm:Regular     Neuro/Psych    GI/Hepatic Neg liver ROS, GERD-  ,  Endo/Other  diabetes  Renal/GU negative Renal ROS     Musculoskeletal   Abdominal   Peds  Hematology   Anesthesia Other Findings   Reproductive/Obstetrics                            Anesthesia Physical Anesthesia Plan  ASA: III  Anesthesia Plan: General   Post-op Pain Management:    Induction: Intravenous  Airway Management Planned: LMA  Additional Equipment:   Intra-op Plan:   Post-operative Plan: Extubation in OR  Informed Consent: I have reviewed the patients History and Physical, chart, labs and discussed the procedure including the risks, benefits and alternatives for the proposed anesthesia with the patient or authorized representative who has indicated his/her understanding and acceptance.   Dental advisory given  Plan Discussed with: CRNA and Anesthesiologist  Anesthesia Plan Comments:         Anesthesia Quick Evaluation

## 2015-03-01 ENCOUNTER — Encounter (HOSPITAL_COMMUNITY): Payer: Self-pay | Admitting: Radiology

## 2015-03-01 MED FILL — Lidocaine HCl IV Inj 20 MG/ML: INTRAVENOUS | Qty: 5 | Status: AC

## 2015-03-01 MED FILL — Propofol IV Emul 200 MG/20ML (10 MG/ML): INTRAVENOUS | Qty: 20 | Status: AC

## 2015-03-01 MED FILL — Phenylephrine HCl Inj 10 MG/ML: INTRAMUSCULAR | Qty: 1 | Status: AC

## 2015-03-01 MED FILL — Fentanyl Citrate Preservative Free (PF) Inj 100 MCG/2ML: INTRAMUSCULAR | Qty: 2 | Status: AC

## 2015-03-01 MED FILL — Midazolam HCl Inj 2 MG/2ML (Base Equivalent): INTRAMUSCULAR | Qty: 2 | Status: AC

## 2015-03-22 ENCOUNTER — Telehealth: Payer: Self-pay | Admitting: Radiology

## 2015-03-22 DIAGNOSIS — I10 Essential (primary) hypertension: Secondary | ICD-10-CM

## 2015-03-22 MED ORDER — ATENOLOL 50 MG PO TABS: 50.0000 mg | ORAL_TABLET | Freq: Every day | ORAL | Status: DC

## 2015-03-22 NOTE — Telephone Encounter (Signed)
Patient requesting refill of atenolol 50 mg Send to [facility]

## 2015-03-22 NOTE — Telephone Encounter (Signed)
Patient was just seen in May 2016 for followup on hypertension. She is well controlled. Will provide refill of atenolol. Please let patient know she can pick this up.

## 2015-03-23 ENCOUNTER — Encounter: Payer: Self-pay | Admitting: Emergency Medicine

## 2015-03-23 ENCOUNTER — Ambulatory Visit (INDEPENDENT_AMBULATORY_CARE_PROVIDER_SITE_OTHER): Payer: BC Managed Care – PPO | Admitting: Emergency Medicine

## 2015-03-23 VITALS — BP 116/76 | HR 59 | Temp 98.2°F | Resp 16 | Ht 62.0 in | Wt 244.0 lb

## 2015-03-23 DIAGNOSIS — R197 Diarrhea, unspecified: Secondary | ICD-10-CM | POA: Diagnosis not present

## 2015-03-23 DIAGNOSIS — R609 Edema, unspecified: Secondary | ICD-10-CM

## 2015-03-23 DIAGNOSIS — I10 Essential (primary) hypertension: Secondary | ICD-10-CM

## 2015-03-23 LAB — BASIC METABOLIC PANEL WITH GFR
BUN: 18 mg/dL (ref 6–23)
CALCIUM: 9.8 mg/dL (ref 8.4–10.5)
CO2: 28 meq/L (ref 19–32)
Chloride: 104 mEq/L (ref 96–112)
Creat: 1.04 mg/dL (ref 0.50–1.10)
GFR, EST AFRICAN AMERICAN: 67 mL/min
GFR, Est Non African American: 58 mL/min — ABNORMAL LOW
Glucose, Bld: 156 mg/dL — ABNORMAL HIGH (ref 70–99)
Potassium: 3.2 mEq/L — ABNORMAL LOW (ref 3.5–5.3)
Sodium: 141 mEq/L (ref 135–145)

## 2015-03-23 LAB — POCT URINALYSIS DIPSTICK
Bilirubin, UA: NEGATIVE
Blood, UA: NEGATIVE
GLUCOSE UA: NEGATIVE
Ketones, UA: NEGATIVE
LEUKOCYTES UA: NEGATIVE
NITRITE UA: NEGATIVE
PROTEIN UA: NEGATIVE
Spec Grav, UA: 1.025
UROBILINOGEN UA: 0.2
pH, UA: 5.5

## 2015-03-23 LAB — POCT UA - MICROSCOPIC ONLY
Bacteria, U Microscopic: NEGATIVE
CASTS, UR, LPF, POC: NEGATIVE
Crystals, Ur, HPF, POC: NEGATIVE
MUCUS UA: NEGATIVE
RBC, urine, microscopic: NEGATIVE
WBC, Ur, HPF, POC: NEGATIVE
Yeast, UA: NEGATIVE

## 2015-03-23 NOTE — Telephone Encounter (Signed)
Pt notified through voicemail.

## 2015-03-23 NOTE — Progress Notes (Signed)
Subjective:  This chart was scribed for Megan Russian, MD by Tamsen Roers, at [facility].  This patient was seen in room 22  and the patient's care was started at 4:14 PM.    Patient ID: [name], female    DOB: 1951-03-10, 64 y.o.   MRN: [id number] Chief Complaint  Patient presents with  . Follow-up  . kidneys  . Medication Refill    Atenolol 50 mg, Flonase               HPI  HPI Comments: [name] is a 64 y.o. female who presents to the [facility] for a follow up/ medication refill (flonase).  Patient states that she has some concerns that she would like to discuss. She drinks about 40 ounces of water in the mornings and retains some of the fluid in her extremities.  She also feels like she is not emptying her bladder fully.    Diarrhea: She has intermittent diarrhea with associated symptoms of abdominal pain onset a couple months ago.  She says her symptoms last for a whole day, three or four times every month. Patient has a history of diverticulitis and is careful with not eating nuts or seeds. Patient is up to date with her colonoscpy ( last February 2015).   She has been taking Miralax regularly for a while now.    Patient Active Problem List   Diagnosis Date Noted  . Diabetes 10/13/2014  . Unspecified essential hypertension 03/17/2014  . Other and unspecified hyperlipidemia 03/17/2014  . Hyperglycemia 03/17/2014  . Allergic rhinitis 09/16/2013  . Arthritis 07/02/2012  . S/P lumbar spinal fusion 07/02/2012  . REFLUX ESOPHAGITIS 12/28/2008  . GERD 12/28/2008  . DIVERTICULOSIS-COLON 12/28/2008  . Abdominal pain, epigastric 12/28/2008  . PERSONAL HX COLONIC POLYPS 12/28/2008   Past Medical History  Diagnosis Date  . Hypertension   . Allergy   . Arthritis   . Anemia   . Anxiety   . GERD (gastroesophageal reflux disease)   . Obesity   . DDD (degenerative disc disease), lumbar   . Blood in stool     . Headache    Past Surgical History  Procedure Laterality Date  . Spine surgery    . Abdominal hysterectomy    . Tonsillectomy and adenoidectomy    . Colonoscopy  2014  . Radiology with anesthesia N/A 02/25/2015    Procedure: MRI OF LUMBAR SPINE;  Surgeon: Medication Radiologist, MD;  Location: [facility];  Service: Radiology;  Laterality: N/A;  DR. Bennie Pierini COHEN/MRI   Allergies  Allergen Reactions  . Crestor [Rosuvastatin]   . Erythromycin     rash  . Lansoprazole   . Penicillins     rash  . Pravastatin   . Robaxin [Methocarbamol]     Messed up stomach  . Sulfonamide Derivatives     rash  . Trental [Pentoxifylline Er]     Bad headache   Prior to Admission medications   Medication Sig Start Date End Date Taking? Authorizing Provider  ALPRAZolam Duanne Moron) 0.5 MG tablet 1/2 to 1 daily prn 01/30/13   Megan Russian, MD  Aspirin-Caffeine [phone] MG PACK Take 1-2 packets by mouth daily as needed (headache).    Historical Provider, MD  atenolol (TENORMIN) 50 MG tablet Take 1 tablet (50 mg total) by mouth daily. 03/22/15   Jaynee Eagles, PA-C  azelastine (ASTELIN) 137 MCG/SPRAY nasal spray Place 2 sprays into both nostrils 2 (  two) times daily. Use in each nostril as directed 09/16/13   Megan Russian, MD  calcium carbonate (TUMS EX) 750 MG chewable tablet Chew 2 tablets by mouth daily as needed for heartburn.    Historical Provider, MD  cetirizine (ZYRTEC) 10 MG tablet Take 2.5 mg by mouth daily as needed for allergies.    Historical Provider, MD  cyclobenzaprine (FLEXERIL) 10 MG tablet Take 2.5 mg by mouth daily as needed (pain level 8 or greater).     Historical Provider, MD  diclofenac sodium (VOLTAREN) 1 % GEL Apply 4 g topically 4 (four) times daily as needed.     Historical Provider, MD  dimenhyDRINATE (DRAMAMINE) 50 MG tablet Take 50 mg by mouth every 8 (eight) hours as needed for nausea.    Historical Provider, MD  EPINEPHrine (EPIPEN) 0.3 mg/0.3 mL DEVI Inject 0.3 mLs (0.3 mg total) into the  muscle once. 01/30/13   Megan Russian, MD  fluticasone (FLONASE) 50 MCG/ACT nasal spray USE TWO SPRAY(S) IN EACH NOSTRIL ONCE DAILY. 12/13/14   Chelle Jeffery, PA-C  hydrochlorothiazide (HYDRODIURIL) 25 MG tablet TAKE ONE TABLET BY MOUTH ONCE DAILY Patient taking differently: TAKE ONE-HALF TABLET BY MOUTH ONCE DAILY 12/10/14   Megan Russian, MD  HYDROcodone-acetaminophen (NORCO/VICODIN) 5-325 MG per tablet Take 0.25 tablets by mouth daily as needed (pain level 8 or greater).  01/13/15   Historical Provider, MD  hydrocortisone 2.5 % ointment APPLY TWICE A DAY 09/16/13   Megan Russian, MD  meloxicam (MOBIC) 15 MG tablet Take 3.75 mg by mouth daily as needed (pain level 8 or greater).     Historical Provider, MD  Multiple Vitamins-Minerals (MULTI-VITAMIN GUMMIES PO) Take by mouth as needed.    Historical Provider, MD  omeprazole (PRILOSEC) 40 MG capsule Take 40 mg by mouth daily.    Historical Provider, MD  Tetrahydrozoline HCl (VISINE OP) Place 3 drops into both eyes daily as needed (allergies).    Historical Provider, MD   History   Social History  . Marital Status: Widowed    Spouse Name: N/A  . Number of Children: N/A  . Years of Education: N/A   Occupational History  . Not on file.   Social History Main Topics  . Smoking status: Former Smoker -- 1.00 packs/day for 38 years    Types: Cigarettes    Quit date: 10/28/1997  . Smokeless tobacco: Never Used  . Alcohol Use: No  . Drug Use: No  . Sexual Activity: No   Other Topics Concern  . 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     Current Outpatient Prescriptions on File Prior to Visit  Medication Sig Dispense Refill  . ALPRAZolam (XANAX) 0.5 MG tablet 1/2 to 1 daily prn 20 tablet 1  . Aspirin-Caffeine 845-65 MG PACK Take 1-2 packets by mouth daily as needed (headache).    Marland Kitchen atenolol (TENORMIN) 50 MG tablet Take 1 tablet (50 mg total) by mouth daily. 30 tablet 5  . azelastine (ASTELIN) 137  MCG/SPRAY nasal spray Place 2 sprays into both nostrils 2 (two) times daily. Use in each nostril as directed 30 mL 12  . calcium carbonate (TUMS EX) 750 MG chewable tablet Chew 2 tablets by mouth daily as needed for heartburn.    . cetirizine (ZYRTEC) 10 MG tablet Take 2.5 mg by mouth daily as needed for allergies.    . cyclobenzaprine (FLEXERIL) 10 MG tablet Take 2.5 mg by mouth  daily as needed (pain level 8 or greater).     Marland Kitchen diclofenac sodium (VOLTAREN) 1 % GEL Apply 4 g topically 4 (four) times daily as needed.     . dimenhyDRINATE (DRAMAMINE) 50 MG tablet Take 50 mg by mouth every 8 (eight) hours as needed for nausea.    Marland Kitchen EPINEPHrine (EPIPEN) 0.3 mg/0.3 mL DEVI Inject 0.3 mLs (0.3 mg total) into the muscle once. 1 Device 2  . fluticasone (FLONASE) 50 MCG/ACT nasal spray USE TWO SPRAY(S) IN EACH NOSTRIL ONCE DAILY. 16 g 1  . hydrochlorothiazide (HYDRODIURIL) 25 MG tablet TAKE ONE TABLET BY MOUTH ONCE DAILY (Patient taking differently: TAKE ONE-HALF TABLET BY MOUTH ONCE DAILY) 30 tablet 4  . HYDROcodone-acetaminophen (NORCO/VICODIN) 5-325 MG per tablet Take 0.25 tablets by mouth daily as needed (pain level 8 or greater).   0  . hydrocortisone 2.5 % ointment APPLY TWICE A DAY 60 g 11  . meloxicam (MOBIC) 15 MG tablet Take 3.75 mg by mouth daily as needed (pain level 8 or greater).     . Multiple Vitamins-Minerals (MULTI-VITAMIN GUMMIES PO) Take by mouth as needed.    Marland Kitchen omeprazole (PRILOSEC) 40 MG capsule Take 40 mg by mouth daily.    . Tetrahydrozoline HCl (VISINE OP) Place 3 drops into both eyes daily as needed (allergies).     No current facility-administered medications on file prior to visit.    Allergies  Allergen Reactions  . Crestor [Rosuvastatin]   . Erythromycin     rash  . Lansoprazole   . Penicillins     rash  . Pravastatin   . Robaxin [Methocarbamol]     Messed up stomach  . Sulfonamide Derivatives     rash  . Trental [Pentoxifylline Er]     Bad headache        Review of Systems  Constitutional: Negative for fever and chills.  Cardiovascular: Positive for leg swelling.  Gastrointestinal: Positive for abdominal pain and diarrhea. Negative for nausea and vomiting.  Musculoskeletal: Negative for neck pain and neck stiffness.       Objective:   Physical Exam CONSTITUTIONAL: Well developed/well nourished HEAD: Normocephalic/atraumatic EYES: EOMI/PERRL ENMT: Mucous membranes moist NECK: supple no meningeal signs SPINE/BACK:entire spine nontender CV: S1/S2 noted, no murmurs/rubs/gallops noted LUNGS: Lungs are clear to auscultation bilaterally, no apparent distress ABDOMEN: soft, nontender, no rebound or guarding, bowel sounds noted throughout abdomen GU:no cva tenderness NEURO: Pt is awake/alert/appropriate, moves all extremitiesx4.  No facial droop.   EXTREMITIES: pulses normal/equal, full ROM SKIN: warm, color normal PSYCH: no abnormalities of mood noted, alert and oriented to situation  Filed Vitals:   03/23/15 1606  BP: 116/76  Pulse: 59  Temp: 98.2 F (36.8 C)  TempSrc: Oral  Resp: 16  Height: 5\' 2"  (1.575 m)  Weight: 244 lb (110.678 kg)  SpO2: 97%   Results for orders placed or performed in visit on 03/23/15  POCT urinalysis dipstick  Result Value Ref Range   Color, UA yellow    Clarity, UA cloudy    Glucose, UA neg    Bilirubin, UA neg    Ketones, UA neg    Spec Grav, UA 1.025    Blood, UA neg    pH, UA 5.5    Protein, UA neg    Urobilinogen, UA 0.2    Nitrite, UA neg    Leukocytes, UA Negative Negative  POCT UA - Microscopic Only  Result Value Ref Range   WBC, Ur, HPF, POC neg  RBC, urine, microscopic neg    Bacteria, U Microscopic neg    Mucus, UA neg    Epithelial cells, urine per micros 2-8    Crystals, Ur, HPF, POC neg    Casts, Ur, LPF, POC neg    Yeast, UA neg          Assessment & Plan:

## 2015-03-23 NOTE — Patient Instructions (Signed)
Irritable Bowel Syndrome Irritable bowel syndrome (IBS) is caused by a disturbance of normal bowel function and is a common digestive disorder. You may also hear this condition called spastic colon, mucous colitis, and irritable colon. There is no cure for IBS. However, symptoms often gradually improve or disappear with a good diet, stress management, and medicine. This condition usually appears in late adolescence or early adulthood. Women develop it twice as often as men. CAUSES  After food has been digested and absorbed in the small intestine, waste material is moved into the large intestine, or colon. In the colon, water and salts are absorbed from the undigested products coming from the small intestine. The remaining residue, or fecal material, is held for elimination. Under normal circumstances, gentle, rhythmic contractions of the bowel walls push the fecal material along the colon toward the rectum. In IBS, however, these contractions are irregular and poorly coordinated. The fecal material is either retained too long, resulting in constipation, or expelled too soon, producing diarrhea. SIGNS AND SYMPTOMS  The most common symptom of IBS is abdominal pain. It is often in the lower left side of the abdomen, but it may occur anywhere in the abdomen. The pain comes from spasms of the bowel muscles happening too much and from the buildup of gas and fecal material in the colon. This pain:  Can range from sharp abdominal cramps to a dull, continuous ache.  Often worsens soon after eating.  Is often relieved by having a bowel movement or passing gas. Abdominal pain is usually accompanied by constipation, but it may also produce diarrhea. The diarrhea often occurs right after a meal or upon waking up in the morning. The stools are often soft, watery, and flecked with mucus. Other symptoms of IBS include:  Bloating.  Loss of appetite.  Heartburn.  Backache.  Dull pain in the arms or  shoulders.  Nausea.  Burping.  Vomiting.  Gas. IBS may also cause symptoms that are unrelated to the digestive system, such as:  Fatigue.  Headaches.  Anxiety.  Shortness of breath.  Trouble concentrating.  Dizziness. These symptoms tend to come and go. DIAGNOSIS  The symptoms of IBS may seem like symptoms of other, more serious digestive disorders. Your health care provider may want to perform tests to exclude these disorders.  TREATMENT Many medicines are available to help correct bowel function or relieve bowel spasms and abdominal pain. Among the medicines available are:  Laxatives for severe constipation and to help restore normal bowel habits.  Specific antidiarrheal medicines to treat severe or lasting diarrhea.  Antispasmodic agents to relieve intestinal cramps. Your health care provider may also decide to treat you with a mild tranquilizer or sedative during unusually stressful periods in your life. Your health care provider may also prescribe antidepressant medicine. The use of this medicine has been shown to reduce pain and other symptoms of IBS. Remember that if any medicine is prescribed for you, you should take it exactly as directed. Make sure your health care provider knows how well it worked for you. HOME CARE INSTRUCTIONS   Take all medicines as directed by your health care provider.  Avoid foods that are high in fat or oils, such as heavy cream, butter, frankfurters, sausage, and other fatty meats.  Avoid foods that make you go to the bathroom, such as fruit, fruit juice, and dairy products.  Cut out carbonated drinks, chewing gum, and "gassy" foods such as beans and cabbage. This may help relieve bloating and burping.    Eat foods with bran, and drink plenty of liquids with the bran foods. This helps relieve constipation.  Keep track of what foods seem to bring on your symptoms.  Avoid emotionally charged situations or circumstances that produce  anxiety.  Start or continue exercising.  Get plenty of rest and sleep. Document Released: 09/25/2005 Document Revised: 09/30/2013 Document Reviewed: 05/15/2008 ExitCare Patient Information 2015 [employer]. This information is not intended to replace advice given to you by your health care provider. Make sure you discuss any questions you have with your health care provider.  

## 2015-03-24 ENCOUNTER — Other Ambulatory Visit: Payer: Self-pay | Admitting: Family Medicine

## 2015-03-24 MED ORDER — POTASSIUM CHLORIDE 20 MEQ PO PACK: 20.0000 meq | PACK | Freq: Every day | ORAL | Status: DC

## 2015-03-25 ENCOUNTER — Telehealth: Payer: Self-pay

## 2015-03-25 NOTE — Telephone Encounter (Signed)
Daub  Cannot take the potassium - bloating, headache, gas.    [facility] on [address]

## 2015-03-26 NOTE — Telephone Encounter (Signed)
Spoke with pt, advised message from Dr. Everlene Farrier pt understood.

## 2015-03-26 NOTE — Telephone Encounter (Signed)
She needs to increase potassium in her diet then. Bananas, oranges or orange juice.

## 2015-03-31 ENCOUNTER — Other Ambulatory Visit: Payer: Self-pay | Admitting: Physician Assistant

## 2015-04-23 ENCOUNTER — Ambulatory Visit (INDEPENDENT_AMBULATORY_CARE_PROVIDER_SITE_OTHER): Payer: BC Managed Care – PPO

## 2015-04-23 ENCOUNTER — Ambulatory Visit (INDEPENDENT_AMBULATORY_CARE_PROVIDER_SITE_OTHER): Payer: BC Managed Care – PPO | Admitting: Emergency Medicine

## 2015-04-23 VITALS — BP 110/82 | HR 58 | Temp 98.0°F | Resp 16 | Ht 62.0 in | Wt 242.0 lb

## 2015-04-23 DIAGNOSIS — G44309 Post-traumatic headache, unspecified, not intractable: Secondary | ICD-10-CM

## 2015-04-23 DIAGNOSIS — R519 Headache, unspecified: Secondary | ICD-10-CM

## 2015-04-23 DIAGNOSIS — E876 Hypokalemia: Secondary | ICD-10-CM | POA: Diagnosis not present

## 2015-04-23 DIAGNOSIS — R51 Headache: Secondary | ICD-10-CM | POA: Diagnosis not present

## 2015-04-23 DIAGNOSIS — Z9109 Other allergy status, other than to drugs and biological substances: Secondary | ICD-10-CM

## 2015-04-23 DIAGNOSIS — Z91048 Other nonmedicinal substance allergy status: Secondary | ICD-10-CM

## 2015-04-23 MED ORDER — AZELASTINE HCL 0.1 % NA SOLN: 2.0000 | Freq: Two times a day (BID) | NASAL | Status: DC

## 2015-04-23 MED ORDER — POTASSIUM CHLORIDE CRYS ER 20 MEQ PO TBCR: 20.0000 meq | EXTENDED_RELEASE_TABLET | Freq: Every day | ORAL | Status: DC

## 2015-04-23 NOTE — Patient Instructions (Signed)

## 2015-04-23 NOTE — Progress Notes (Signed)
This chart was scribed for [name], MD by Marti Sleigh, Medical Scribe. This patient was seen in Room 14 and the patient's care was started at 4:53 PM.   Chief Complaint:  Chief Complaint  Patient presents with  . Sinusitis    Onset 2 weeks  . Dizziness    HPI: [name] is a 64 y.o. female who reports to [facility] today complaining of sinus pain for the last ten days. Pt states that her sx started with congestion followed by sinus drainage. Then she developed fever and chills, and a HA. Pt states she continues to have sinus drainage and tenderness, as well as HA. Pt states her drainage has a foul taste. Pt states her sugar has been  ROS Patient states she fell one month ago and struck the back of her head. Since that time she has had persistent headaches and hard to get relief.  Past Medical History  Diagnosis Date  . Hypertension   . Allergy   . Arthritis   . Anemia   . Anxiety   . GERD (gastroesophageal reflux disease)   . Obesity   . DDD (degenerative disc disease), lumbar   . Blood in stool   . Headache    Past Surgical History  Procedure Laterality Date  . Spine surgery    . Abdominal hysterectomy    . Tonsillectomy and adenoidectomy    . Colonoscopy  2014  . Radiology with anesthesia N/A 02/25/2015    Procedure: MRI OF LUMBAR SPINE;  Surgeon: Medication Radiologist, MD;  Location: [facility];  Service: Radiology;  Laterality: N/A;  DR. Bennie Pierini COHEN/MRI   History   Social History  . Marital Status: Widowed    Spouse Name: N/A  . Number of Children: N/A  . Years of Education: N/A   Social History Main Topics  . Smoking status: Former Smoker -- 1.00 packs/day for 38 years    Types: Cigarettes    Quit date: 10/28/1997  . Smokeless tobacco: Never Used  . Alcohol Use: No  . Drug Use: No  . Sexual Activity: No   Other Topics Concern  . 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   Family History  Problem Relation Age of  Onset  . Hypertension Mother   . Hyperlipidemia Mother   . Hyperlipidemia Father   . Hypertension Father   . Arthritis Father   . Hyperlipidemia Sister   . Cervical cancer Sister   . Diabetes Sister   . Heart disease Sister   . Cancer Maternal Grandfather   . Hypertension Paternal Grandmother   . Pancreatic cancer Maternal Aunt   . Breast cancer Paternal Aunt   . Breast cancer Cousin    Allergies  Allergen Reactions  . Crestor [Rosuvastatin]   . Erythromycin     rash  . Lansoprazole   . Penicillins     rash  . Potassium-Containing Compounds Other (See Comments)    Stomach upset   . Pravastatin   . Robaxin [Methocarbamol]     Messed up stomach  . Sulfonamide Derivatives     rash  . Trental [Pentoxifylline Er]     Bad headache   Prior to Admission medications   Medication Sig Start Date End Date Taking? Authorizing Provider  ALPRAZolam Duanne Moron) 0.5 MG tablet 1/2 to 1 daily prn 01/30/13  Yes [name], MD  Aspirin-Caffeine [phone] MG PACK Take 1-2 packets by mouth daily as needed (headache).  Yes Historical Provider, MD  atenolol (TENORMIN) 50 MG tablet Take 1 tablet (50 mg total) by mouth daily. 03/22/15  Yes Jaynee Eagles, PA-C  azelastine (ASTELIN) 137 MCG/SPRAY nasal spray Place 2 sprays into both nostrils 2 (two) times daily. Use in each nostril as directed 09/16/13  Yes [name], MD  calcium carbonate (TUMS EX) 750 MG chewable tablet Chew 2 tablets by mouth daily as needed for heartburn.   Yes Historical Provider, MD  cetirizine (ZYRTEC) 10 MG tablet Take 2.5 mg by mouth daily as needed for allergies.   Yes Historical Provider, MD  cyclobenzaprine (FLEXERIL) 10 MG tablet Take 2.5 mg by mouth daily as needed (pain level 8 or greater).    Yes Historical Provider, MD  diclofenac sodium (VOLTAREN) 1 % GEL Apply 4 g topically 4 (four) times daily as needed.    Yes Historical Provider, MD  dimenhyDRINATE (DRAMAMINE) 50 MG tablet Take 50 mg by mouth every 8 (eight) hours as  needed for nausea.   Yes Historical Provider, MD  EPINEPHrine (EPIPEN) 0.3 mg/0.3 mL DEVI Inject 0.3 mLs (0.3 mg total) into the muscle once. 01/30/13  Yes [name], MD  fluticasone (FLONASE) 50 MCG/ACT nasal spray USE TWO SPRAY(S) IN EACH NOSTRIL ONCE DAILY. 12/13/14  Yes Chelle Jeffery, PA-C  hydrochlorothiazide (HYDRODIURIL) 25 MG tablet TAKE ONE TABLET BY MOUTH ONCE DAILY Patient taking differently: TAKE ONE-HALF TABLET BY MOUTH ONCE DAILY 12/10/14  Yes [name], MD  HYDROcodone-acetaminophen (NORCO/VICODIN) 5-325 MG per tablet Take 0.25 tablets by mouth daily as needed (pain level 8 or greater).  01/13/15  Yes Historical Provider, MD  hydrocortisone 2.5 % ointment APPLY TWICE A DAY 09/16/13  Yes [name], MD  meloxicam (MOBIC) 15 MG tablet Take 3.75 mg by mouth daily as needed (pain level 8 or greater).    Yes Historical Provider, MD  Multiple Vitamins-Minerals (MULTI-VITAMIN GUMMIES PO) Take by mouth as needed.   Yes Historical Provider, MD  omeprazole (PRILOSEC) 40 MG capsule Take 40 mg by mouth daily.   Yes Historical Provider, MD  Tetrahydrozoline HCl (VISINE OP) Place 3 drops into both eyes daily as needed (allergies).   Yes Historical Provider, MD  potassium chloride (KLOR-CON) 20 MEQ packet Take 20 mEq by mouth daily. Patient not taking: Reported on 04/23/2015 03/24/15   [name], MD     ROS: The patient denies unintentional weight loss, chest pain, palpitations, wheezing, dyspnea on exertion, dysuria, hematuria, melena, numbness, weakness, or tingling.   All other systems have been reviewed and were otherwise negative with the exception of those mentioned in the HPI and as above.    PHYSICAL EXAM: Filed Vitals:   04/23/15 1643  BP: 110/82  Pulse: 58  Temp: 98 F (36.7 C)  Resp: 16   Body mass index is 44.25 kg/(m^2).   General: Alert, no acute distress HEENT:  Normocephalic, atraumatic, oropharynx patent. Eye: Juliette Mangle [facility] Cardiovascular:  Regular rate and  rhythm, no rubs murmurs or gallops.  No Carotid bruits, radial pulse intact. No pedal edema.  Respiratory: Clear to auscultation bilaterally.  No wheezes, rales, or rhonchi.  No cyanosis, no use of accessory musculature Abdominal: No organomegaly, abdomen is soft and non-tender, positive bowel sounds.  No masses. Musculoskeletal: Gait intact. No edema, tenderness Skin: No rashes. Neurologic: Facial musculature symmetric. Psychiatric: Patient acts appropriately throughout our interaction. Lymphatic: No cervical or submandibular lymphadenopathy Genitourinary/Anorectal: No acute findings    LABS: Results for orders placed or performed in  visit on 03/88/82  BASIC METABOLIC PANEL WITH GFR  Result Value Ref Range   Sodium 141 135 - 145 mEq/L   Potassium 3.2 (L) 3.5 - 5.3 mEq/L   Chloride 104 96 - 112 mEq/L   CO2 28 19 - 32 mEq/L   Glucose, Bld 156 (H) 70 - 99 mg/dL   BUN 18 6 - 23 mg/dL   Creat 1.04 0.50 - 1.10 mg/dL   Calcium 9.8 8.4 - 10.5 mg/dL   GFR, Est African American 67 mL/min   GFR, Est Non African American 58 (L) mL/min  POCT urinalysis dipstick  Result Value Ref Range   Color, UA yellow    Clarity, UA cloudy    Glucose, UA neg    Bilirubin, UA neg    Ketones, UA neg    Spec Grav, UA 1.025    Blood, UA neg    pH, UA 5.5    Protein, UA neg    Urobilinogen, UA 0.2    Nitrite, UA neg    Leukocytes, UA Negative Negative  POCT UA - Microscopic Only  Result Value Ref Range   WBC, Ur, HPF, POC neg    RBC, urine, microscopic neg    Bacteria, U Microscopic neg    Mucus, UA neg    Epithelial cells, urine per micros 2-8    Crystals, Ur, HPF, POC neg    Casts, Ur, LPF, POC neg    Yeast, UA neg    Meds ordered this encounter  Medications  . potassium chloride SA (K-DUR,KLOR-CON) 20 MEQ tablet    Sig: Take 1 tablet (20 mEq total) by mouth daily.    Dispense:  30 tablet    Refill:  3  . azelastine (ASTELIN) 0.1 % nasal spray    Sig: Place 2 sprays into both nostrils 2  (two) times daily. Use in each nostril as directed    Dispense:  30 mL    Refill:  11    EKG/XRAY:   Primary read interpreted by Dr. Everlene Farrier at [facility]. There is a retention cyst on the left. There are no air-fluid levels seen.   ASSESSMENT/PLAN: Patient presents with sinus congestion. She has multiple anabolic allergies. She also has diabetes. Will treat with accommodation of Astepro and Flonase. She will continue Mucinex. She also has a history of hypokalemia and did not tolerate liquid potassium. Have started her on potassium tablets one a day. Recheck in one month check potassium and sugar at that time along with an A1c. We'll proceed with CT of the head. This will be helpful with her head injury as well as get a good picture of her sinuses. When she returns in a week would do a potassium level then.I personally performed the services described in this documentation, which was scribed in my presence. The recorded information has been reviewed and is accurate.  Nena Jordan, MD   Gross sideeffects, risk and benefits, and alternatives of medications d/w patient. Patient is aware that all medications have potential sideeffects and we are unable to predict every sideeffect or drug-drug interaction that may occur.  [name] MD 04/23/2015 4:53 PM

## 2015-04-30 ENCOUNTER — Telehealth: Payer: Self-pay

## 2015-04-30 ENCOUNTER — Ambulatory Visit
Admission: RE | Admit: 2015-04-30 | Discharge: 2015-04-30 | Disposition: A | Discharge status: Home / Self Care | Payer: Medicare Other | Source: Ambulatory Visit | Attending: Emergency Medicine | Admitting: Emergency Medicine

## 2015-04-30 DIAGNOSIS — G44309 Post-traumatic headache, unspecified, not intractable: Secondary | ICD-10-CM

## 2015-04-30 DIAGNOSIS — R51 Headache: Principal | ICD-10-CM

## 2015-04-30 DIAGNOSIS — R519 Headache, unspecified: Secondary | ICD-10-CM

## 2015-04-30 NOTE — Telephone Encounter (Signed)
[facility] called. They were unable to do the scan at their location because of severe claustophobia.  They suggested maybe having it done at the hospital where they could sedate her.

## 2015-05-03 NOTE — Telephone Encounter (Signed)
So call in #2 (pills)?

## 2015-05-03 NOTE — Telephone Encounter (Signed)
Yes 2 pill should be enough.

## 2015-05-03 NOTE — Telephone Encounter (Signed)
Call patient we can give her Xanax 0.5 mg one hour prior to the procedure and repeat in 45 minutes if no improvement. Call and reschedule her CT to be done at [facility] or [facility]. We can call in a prescription for Xanax 0.5 take  1 by mouth 1 hour prior to procedure and repeat in 30-45 minutes if no improvement

## 2015-05-04 NOTE — Telephone Encounter (Signed)
Called in Rx to [facility]. Spoke with pt, she will call me with the day she would like to have her scan done. She may need someone to drive her so she has to make arrangements.

## 2015-05-05 NOTE — Telephone Encounter (Signed)
I spoke with pt again, she does not want to do the scan anymore. FYI Dr. Everlene Farrier

## 2015-05-05 NOTE — Telephone Encounter (Signed)
Pt would like to be rescheduled until the first week in August. Can we reschedule for pt? Or can we call [facility] imaging and reschedule since the referral is already made. Let me know. Thanks.

## 2015-05-19 ENCOUNTER — Other Ambulatory Visit: Payer: Self-pay | Admitting: Physician Assistant

## 2015-07-13 ENCOUNTER — Encounter: Payer: Self-pay | Admitting: Emergency Medicine

## 2015-08-04 ENCOUNTER — Encounter: Payer: Self-pay | Admitting: Emergency Medicine

## 2015-09-12 ENCOUNTER — Telehealth: Payer: Self-pay

## 2015-09-12 NOTE — Telephone Encounter (Signed)
PATIENT WOULD LIKE DR. DAUB TO KNOW THAT SHE NEEDS A REFILL ON HER HYDROCHLOROTHIAZIDE 25 MG. SHE SAID HER PHARMACY SAID THEY SENT OVER A REQUEST 3 DAYS AGO, BUT SHE HAS NOT HEARD ANYTHING BACK FROM [place] YET. BEST PHONE [phone] (CELL)  [name]   MBC

## 2015-09-13 MED ORDER — HYDROCHLOROTHIAZIDE 25 MG PO TABS: 25.0000 mg | ORAL_TABLET | Freq: Every day | ORAL | Status: DC

## 2015-09-13 NOTE — Telephone Encounter (Signed)
Ok   refilled

## 2015-09-22 ENCOUNTER — Telehealth: Payer: Self-pay

## 2015-09-22 ENCOUNTER — Other Ambulatory Visit: Payer: Self-pay

## 2015-09-22 DIAGNOSIS — I10 Essential (primary) hypertension: Secondary | ICD-10-CM

## 2015-09-22 MED ORDER — ATENOLOL 50 MG PO TABS: 50.0000 mg | ORAL_TABLET | Freq: Every day | ORAL | Status: DC

## 2015-10-24 ENCOUNTER — Telehealth: Payer: Self-pay | Admitting: Family Medicine

## 2015-10-24 NOTE — Telephone Encounter (Signed)
lmom to call and reschedule appt with daub 

## 2016-01-11 ENCOUNTER — Encounter: Payer: BC Managed Care – PPO | Admitting: Emergency Medicine

## 2016-01-12 ENCOUNTER — Ambulatory Visit (INDEPENDENT_AMBULATORY_CARE_PROVIDER_SITE_OTHER): Payer: BC Managed Care – PPO | Admitting: Family Medicine

## 2016-01-12 VITALS — BP 122/80 | HR 57 | Temp 97.8°F | Resp 18 | Ht 62.0 in | Wt 248.4 lb

## 2016-01-12 DIAGNOSIS — R197 Diarrhea, unspecified: Secondary | ICD-10-CM | POA: Diagnosis not present

## 2016-01-12 DIAGNOSIS — J0100 Acute maxillary sinusitis, unspecified: Secondary | ICD-10-CM | POA: Diagnosis not present

## 2016-01-12 MED ORDER — LEVOFLOXACIN 500 MG PO TABS: 500.0000 mg | ORAL_TABLET | Freq: Every day | ORAL | Status: DC

## 2016-01-12 NOTE — Progress Notes (Signed)
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  Patient has has sinus problems for 10 days and developed diarrhea 3 days ago.  She started with vomiting early Monday morning, now just has loose stools and nausea.  She took imodium which helped with diarrhea.  She treated the sinus congestion with flonase, but developed epistaxis left side and stopped taking it.  Nasal congestion persists.  Occasional cough.  She did have a fever and sweats, but this has resolved.  Objective:  NAD BP 122/80 mmHg  Pulse 57  Temp(Src) 97.8 F (36.6 C) (Oral)  Resp 18  Ht 5\' 2"  (1.575 m)  Wt 248 lb 6.4 oz (112.674 kg)  BMI 45.42 kg/m2  SpO2 97% HEENT:  Mucopurulent nasal discharge, no epistaxis Neck:  Supple, no adenop or thyromegaly Chest:  Clear Heart:  Reg, about 80 bpm Abdomen:  Nontender.  Assessment:   Acute maxillary sinusitis, recurrence not specified - Plan: levofloxacin (LEVAQUIN) 500 MG tablet  Diarrhea, unspecified type  Robyn Haber, MD

## 2016-01-12 NOTE — Patient Instructions (Signed)
Let me know if you are not better in 2 days.

## 2016-02-17 ENCOUNTER — Encounter: Payer: Self-pay | Admitting: Emergency Medicine

## 2016-02-17 ENCOUNTER — Ambulatory Visit (INDEPENDENT_AMBULATORY_CARE_PROVIDER_SITE_OTHER): Payer: BC Managed Care – PPO | Admitting: Emergency Medicine

## 2016-02-17 VITALS — BP 139/84 | HR 59 | Temp 97.8°F | Resp 18 | Ht 62.0 in | Wt 246.0 lb

## 2016-02-17 DIAGNOSIS — I10 Essential (primary) hypertension: Secondary | ICD-10-CM

## 2016-02-17 DIAGNOSIS — E876 Hypokalemia: Secondary | ICD-10-CM

## 2016-02-17 DIAGNOSIS — R739 Hyperglycemia, unspecified: Secondary | ICD-10-CM | POA: Diagnosis not present

## 2016-02-17 DIAGNOSIS — Z1159 Encounter for screening for other viral diseases: Secondary | ICD-10-CM | POA: Diagnosis not present

## 2016-02-17 DIAGNOSIS — E785 Hyperlipidemia, unspecified: Secondary | ICD-10-CM | POA: Diagnosis not present

## 2016-02-17 DIAGNOSIS — Z0000 Encounter for general adult medical examination without abnormal findings: Secondary | ICD-10-CM

## 2016-02-17 DIAGNOSIS — R1030 Lower abdominal pain, unspecified: Secondary | ICD-10-CM | POA: Diagnosis not present

## 2016-02-17 LAB — CBC WITH DIFFERENTIAL/PLATELET
BASOS ABS: 40 {cells}/uL (ref 0–200)
Basophils Relative: 1 %
EOS ABS: 280 {cells}/uL (ref 15–500)
Eosinophils Relative: 7 %
HEMATOCRIT: 42.4 % (ref 35.0–45.0)
HEMOGLOBIN: 14.2 g/dL (ref 11.7–15.5)
LYMPHS ABS: 1480 {cells}/uL (ref 850–3900)
Lymphocytes Relative: 37 %
MCH: 29.2 pg (ref 27.0–33.0)
MCHC: 33.5 g/dL (ref 32.0–36.0)
MCV: 87.2 fL (ref 80.0–100.0)
MONO ABS: 400 {cells}/uL (ref 200–950)
MPV: 10.2 fL (ref 7.5–12.5)
Monocytes Relative: 10 %
NEUTROS ABS: 1800 {cells}/uL (ref 1500–7800)
Neutrophils Relative %: 45 %
Platelets: 228 10*3/uL (ref 140–400)
RBC: 4.86 MIL/uL (ref 3.80–5.10)
RDW: 15.7 % — ABNORMAL HIGH (ref 11.0–15.0)
WBC: 4 10*3/uL (ref 3.8–10.8)

## 2016-02-17 LAB — COMPLETE METABOLIC PANEL WITH GFR
ALBUMIN: 4.1 g/dL (ref 3.6–5.1)
ALT: 19 U/L (ref 6–29)
AST: 28 U/L (ref 10–35)
Alkaline Phosphatase: 63 U/L (ref 33–130)
BILIRUBIN TOTAL: 0.3 mg/dL (ref 0.2–1.2)
BUN: 17 mg/dL (ref 7–25)
CALCIUM: 9.9 mg/dL (ref 8.6–10.4)
CO2: 27 mmol/L (ref 20–31)
Chloride: 102 mmol/L (ref 98–110)
Creat: 0.92 mg/dL (ref 0.50–0.99)
GFR, EST AFRICAN AMERICAN: 77 mL/min (ref >=60)
GFR, Est Non African American: 67 mL/min (ref >=60)
GLUCOSE: 104 mg/dL — AB (ref 65–99)
POTASSIUM: 3.8 mmol/L (ref 3.5–5.3)
SODIUM: 142 mmol/L (ref 135–146)
TOTAL PROTEIN: 6.8 g/dL (ref 6.1–8.1)

## 2016-02-17 LAB — POCT URINALYSIS DIP (MANUAL ENTRY)
BILIRUBIN UA: NEGATIVE
BILIRUBIN UA: NEGATIVE
Blood, UA: NEGATIVE
GLUCOSE UA: NEGATIVE
LEUKOCYTES UA: NEGATIVE
Nitrite, UA: NEGATIVE
PROTEIN UA: NEGATIVE
Spec Grav, UA: 1.02
Urobilinogen, UA: 0.2
pH, UA: 5.5

## 2016-02-17 LAB — LIPID PANEL
CHOL/HDL RATIO: 4 ratio (ref <=5.0)
CHOLESTEROL: 189 mg/dL (ref 125–200)
HDL: 47 mg/dL (ref >=46)
LDL Cholesterol: 120 mg/dL (ref <130)
TRIGLYCERIDES: 108 mg/dL (ref <150)
VLDL: 22 mg/dL (ref <30)

## 2016-02-17 LAB — HEMOGLOBIN A1C
Hgb A1c MFr Bld: 6.1 % — ABNORMAL HIGH (ref <5.7)
MEAN PLASMA GLUCOSE: 128 mg/dL

## 2016-02-17 LAB — TSH: TSH: 2.4 mIU/L

## 2016-02-17 MED ORDER — LORAZEPAM 0.5 MG PO TABS: 0.5000 mg | ORAL_TABLET | Freq: Two times a day (BID) | ORAL | Status: DC | PRN

## 2016-02-17 NOTE — Addendum Note (Signed)
Addended by: [name] A on: 02/17/2016 12:20 PM   Modules accepted: Miquel Dunn

## 2016-02-17 NOTE — Patient Instructions (Addendum)
IF you received an x-ray today, you will receive an invoice from [facility] Radiology. Please contact [facility] at [phone] with questions or concerns regarding your invoice.   IF you received labwork today, you will receive an invoice from [employer]. Please contact [facility] at [phone] with questions or concerns regarding your invoice.   Our billing staff will not be able to assist you with questions regarding bills from these companies.  You will be contacted with the lab results as soon as they are available. The fastest way to get your results is to activate your My Chart account. Instructions are located on the last page of this paperwork. If you have not heard from [place] regarding the results in 2 weeks, please contact this office.    Irritable Bowel Syndrome, Adult Irritable bowel syndrome (IBS) is not one specific disease. It is a group of symptoms that affects the organs responsible for digestion (gastrointestinal or GI tract).  To regulate how your GI tract works, your body sends signals back and forth between your intestines and your brain. If you have IBS, there may be a problem with these signals. As a result, your GI tract does not function normally. Your intestines may become more sensitive and overreact to certain things. This is especially true when you eat certain foods or when you are under stress.  There are four types of IBS. These may be determined based on the consistency of your stool:   IBS with diarrhea.   IBS with constipation.   Mixed IBS.   Unsubtyped IBS.  It is important to know which type of IBS you have. Some treatments are more likely to be helpful for certain types of IBS.  CAUSES  The exact cause of IBS is not known. RISK FACTORS You may have a higher risk of IBS if:  You are a woman.  You are younger than 63 years old.  You have a family history of IBS.  You have mental health  problems.  You have had bacterial infection of your GI tract. SIGNS AND SYMPTOMS  Symptoms of IBS vary from person to person. The main symptom is abdominal pain or discomfort. Additional symptoms usually include one or more of the following:   Diarrhea, constipation, or both.   Abdominal swelling or bloating.   Feeling full or sick after eating a small or regular-size meal.   Frequent gas.   Mucus in the stool.   A feeling of having more stool left after a bowel movement.  Symptoms tend to come and go. They may be associated with stress, psychiatric conditions, or nothing at all.  DIAGNOSIS  There is no specific test to diagnose IBS. Your health care provider will make a diagnosis based on a physical exam, medical history, and your symptoms. You may have other tests to rule out other conditions that may be causing your symptoms. These may include:   Blood tests.   X-rays.   CT scan.  Endoscopy and colonoscopy. This is a test in which your GI tract is viewed with a long, thin, flexible tube. TREATMENT There is no cure for IBS, but treatment can help relieve symptoms. IBS treatment often includes:   Changes to your diet, such as:  Eating more fiber.  Avoiding foods that cause symptoms.  Drinking more water.  Eating regular, medium-sized portioned meals.  Medicines. These may include:  Fiber supplements if you have constipation.  Medicine to control diarrhea (antidiarrheal medicines).  Medicine  to help control muscle spasms in your GI tract (antispasmodic medicines).  Medicines to help with any mental health issues, such as antidepressants or tranquilizers.  Therapy.  Talk therapy may help with anxiety, depression, or other mental health issues that can make IBS symptoms worse.  Stress reduction.  Managing your stress can help keep symptoms under control. HOME CARE INSTRUCTIONS   Take medicines only as directed by your health care provider.  Eat a  healthy diet.  Avoid foods and drinks with added sugar.  Include more whole grains, fruits, and vegetables gradually into your diet. This may be especially helpful if you have IBS with constipation.  Avoid any foods and drinks that make your symptoms worse. These may include dairy products and caffeinated or carbonated drinks.  Do not eat large meals.  Drink enough fluid to keep your urine clear or pale yellow.  Exercise regularly. Ask your health care provider for recommendations of good activities for you.  Keep all follow-up visits as directed by your health care provider. This is important. SEEK MEDICAL CARE IF:   You have constant pain.  You have trouble or pain with swallowing.  You have worsening diarrhea. SEEK IMMEDIATE MEDICAL CARE IF:   You have severe and worsening abdominal pain.   You have diarrhea and:   You have a rash, stiff neck, or severe headache.   You are irritable, sleepy, or difficult to awaken.   You are weak, dizzy, or extremely thirsty.   You have bright red blood in your stool or you have black tarry stools.   You have unusual abdominal swelling that is painful.   You vomit continuously.   You vomit blood (hematemesis).   You have both abdominal pain and a fever.    This information is not intended to replace advice given to you by your health care provider. Make sure you discuss any questions you have with your health care provider.   Document Released: 09/25/2005 Document Revised: 10/16/2014 Document Reviewed: 06/12/2014 Elsevier Interactive Patient Education [employer].

## 2016-02-17 NOTE — Progress Notes (Addendum)
By signing my name below, I, [name], attest that this documentation has been prepared under the direction and in the presence of [name], MD.  Electronically Signed: [name], Medical Scribe. 02/17/2016. 9:36 AM.  Chief Complaint:  Chief Complaint  Patient presents with  . Annual Exam    no pap   HPI: [name] is a 65 y.o. female who reports to [facility] today for an annual exam. Pt mentions taking care of her 63 y/o mother with dementia. Pt doesn't have any help for her mother from anyone. Pt doesn't exercise as much as she did before because she's busy taking care of her mom. Pt gets her back checked at [facility] by [facility]. Pt denies going to the ophthalmologist, and reports her left eye needs to be checked. Pt mentions her hysterectomy was done for fibroids and endometriosis. Pt reports having her mammogram done regularly. Pt mentions her anxiety medication upsets her stomach. Pt complains of frequent nauesa and diahrrea once every month. Pt reports her last colonoscopy was done in the last 2 years. Pt reports she still has her gallbladder.   Pt mentions having her shingles vaccine in 2016.  Past Medical History  Diagnosis Date  . Hypertension   . Allergy   . Arthritis   . Anemia   . Anxiety   . GERD (gastroesophageal reflux disease)   . Obesity   . DDD (degenerative disc disease), lumbar   . Blood in stool   . Headache    Past Surgical History  Procedure Laterality Date  . Spine surgery    . Abdominal hysterectomy    . Tonsillectomy and adenoidectomy    . Colonoscopy  2014  . Radiology with anesthesia N/A 02/25/2015    Procedure: MRI OF LUMBAR SPINE;  Surgeon: Medication Radiologist, MD;  Location: [facility];  Service: Radiology;  Laterality: N/A;  DR. Bennie Pierini COHEN/MRI   Social History   Social History  . Marital Status: Widowed    Spouse Name: N/A  . Number of Children: N/A  . Years of Education: N/A   Social History Main  Topics  . Smoking status: Former Smoker -- 1.00 packs/day for 38 years    Types: Cigarettes    Quit date: 10/28/1997  . Smokeless tobacco: Never Used  . Alcohol Use: No  . Drug Use: No  . Sexual Activity: No   Other Topics Concern  . 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   Family History  Problem Relation Age of Onset  . Hypertension Mother   . Hyperlipidemia Mother   . Hyperlipidemia Father   . Hypertension Father   . Arthritis Father   . Hyperlipidemia Sister   . Cervical cancer Sister   . Diabetes Sister   . Heart disease Sister   . Cancer Maternal Grandfather   . Hypertension Paternal Grandmother   . Pancreatic cancer Maternal Aunt   . Breast cancer Paternal Aunt   . Breast cancer Cousin    Allergies  Allergen Reactions  . Crestor [Rosuvastatin]   . Erythromycin     rash  . Lansoprazole   . Penicillins     rash  . Potassium-Containing Compounds Other (See Comments)    Stomach upset   . Pravastatin   . Robaxin [Methocarbamol]     Messed up stomach  . Sulfonamide Derivatives     rash  . Trental [Pentoxifylline Er]     Bad  headache   Prior to Admission medications   Medication Sig Start Date End Date Taking? Authorizing Provider  ALPRAZolam Duanne Moron) 0.5 MG tablet 1/2 to 1 daily prn 01/30/13  Yes [name], MD  Aspirin-Caffeine [phone] MG PACK Take 1-2 packets by mouth daily as needed (headache).   Yes Historical Provider, MD  atenolol (TENORMIN) 50 MG tablet Take 1 tablet (50 mg total) by mouth daily. 09/22/15  Yes [name], MD  azelastine (ASTELIN) 0.1 % nasal spray Place 2 sprays into both nostrils 2 (two) times daily. Use in each nostril as directed 04/23/15  Yes [name], MD  calcium carbonate (TUMS EX) 750 MG chewable tablet Chew 2 tablets by mouth daily as needed for heartburn.   Yes Historical Provider, MD  cetirizine (ZYRTEC) 10 MG tablet Take 2.5 mg by mouth daily as needed for allergies.   Yes  Historical Provider, MD  cyclobenzaprine (FLEXERIL) 10 MG tablet Take 2.5 mg by mouth daily as needed (pain level 8 or greater).    Yes Historical Provider, MD  diclofenac sodium (VOLTAREN) 1 % GEL Apply 4 g topically 4 (four) times daily as needed.    Yes Historical Provider, MD  dimenhyDRINATE (DRAMAMINE) 50 MG tablet Take 50 mg by mouth every 8 (eight) hours as needed for nausea. Reported on 01/12/2016   Yes Historical Provider, MD  fluticasone (FLONASE) 50 MCG/ACT nasal spray USE TWO SPRAY (S) IN EACH NOSTRIL ONCE DAILY. 05/19/15  Yes Mancel Bale, PA-C  hydrochlorothiazide (HYDRODIURIL) 25 MG tablet Take 1 tablet (25 mg total) by mouth daily. 09/13/15  Yes [name], MD  HYDROcodone-acetaminophen (NORCO/VICODIN) 5-325 MG per tablet Take 0.25 tablets by mouth daily as needed (pain level 8 or greater).  01/13/15  Yes Historical Provider, MD  hydrocortisone 2.5 % ointment APPLY TWICE A DAY 09/16/13  Yes [name], MD  levofloxacin (LEVAQUIN) 500 MG tablet Take 1 tablet (500 mg total) by mouth daily. 01/12/16  Yes Robyn Haber, MD  meloxicam (MOBIC) 15 MG tablet Take 3.75 mg by mouth daily as needed (pain level 8 or greater).    Yes Historical Provider, MD  Multiple Vitamins-Minerals (MULTI-VITAMIN GUMMIES PO) Take by mouth as needed.   Yes Historical Provider, MD  omeprazole (PRILOSEC) 40 MG capsule Take 40 mg by mouth daily.   Yes Historical Provider, MD  potassium chloride SA (K-DUR,KLOR-CON) 20 MEQ tablet Take 1 tablet (20 mEq total) by mouth daily. 04/23/15  Yes [name], MD  Tetrahydrozoline HCl (VISINE OP) Place 3 drops into both eyes daily as needed (allergies). Reported on 01/12/2016   Yes Historical Provider, MD  EPINEPHrine (EPIPEN) 0.3 mg/0.3 mL DEVI Inject 0.3 mLs (0.3 mg total) into the muscle once. Patient not taking: Reported on 02/17/2016 01/30/13   [name], MD     ROS: The patient denies fevers, chills, night sweats, unintentional weight loss, chest pain, palpitations,  wheezing, dyspnea on exertion, vomiting, abdominal pain, dysuria, hematuria, melena, numbness, weakness, or tingling.She is having some pain at the base of her right thumb and pain with movement of the thumb.  All other systems have been reviewed and were otherwise negative with the exception of those mentioned in the HPI and as above.    PHYSICAL EXAM: Filed Vitals:   02/17/16 0930  BP: 139/84  Pulse: 59  Temp: 97.8 F (36.6 C)  Resp: 18   Body mass index is 44.98 kg/(m^2).   General: Alert, no acute distress HEENT:  Normocephalic,  atraumatic, oropharynx patent. Eye: [facility] Cardiovascular:  Regular rate and rhythm, no rubs murmurs or gallops.  No Carotid bruits, radial pulse intact. No pedal edema.  Respiratory: Clear to auscultation bilaterally.  No wheezes, rales, or rhonchi.  No cyanosis, no use of accessory musculature Abdominal: No organomegaly, abdomen is soft and non-tender, positive bowel sounds.  No masses felt. Breast exam had no masses. Musculoskeletal: Gait intact. No edema, tendernessThere is mild tenderness at the base of the right and also at the first MCP joint Skin: No rashes. Neurologic: Facial musculature symmetric. Psychiatric: Patient acts appropriately throughout our interaction. Lymphatic: No cervical or submandibular lymphadenopathy   LABS: Results for orders placed or performed in visit on 02/17/16  POCT urinalysis dipstick  Result Value Ref Range   Color, UA yellow yellow   Clarity, UA clear clear   Glucose, UA negative negative   Bilirubin, UA negative negative   Ketones, POC UA negative negative   Spec Grav, UA 1.020    Blood, UA negative negative   pH, UA 5.5    Protein Ur, POC negative negative   Urobilinogen, UA 0.2    Nitrite, UA Negative Negative   Leukocytes, UA Negative Negative   EKG/XRAY:   Primary read interpreted by Dr. Everlene Farrier at [facility]. Patient has a sinus bradycardia without acute changes.  ASSESSMENT/PLAN: Routine labs  were done. She was given information about irritable bowel syndrome. We'll schedule CT abdomen pelvis. I did change her Xanax to Ativan to take. I suspect most of her symptoms are related to stress of taking care of her 48 year old mother MI personally performed the services described in this documentation, which was scribed in my presence. The recorded information has been reviewed and is accurate. Patient does have a sinus bradycardia. She is currently on atenolol. She is asymptomatic with this she denies any weak spells or dizziness.  Gross sideeffects, risk and benefits, and alternatives of medications d/w patient. Patient is aware that all medications have potential sideeffects and we are unable to predict every sideeffect or drug-drug interaction that may occur.  [name] MD 02/17/2016 9:35 AM

## 2016-02-18 LAB — HEPATITIS C ANTIBODY: HCV AB: NEGATIVE

## 2016-02-24 ENCOUNTER — Telehealth: Payer: Self-pay

## 2016-02-24 NOTE — Telephone Encounter (Signed)
An order was placed for a CT Abdomen Pelvis for the patient, but the insurance has denied the request.  The case was then sent to clinical review for an appeal, but it has been denied there also.  According to [facility], the scan is not recommended for diagnosis of irritable bowel syndrome, which is the indication noted in the patient's OV notes.  This is the final level of review available to the ordering physician.  Please advise on the next steps for the patient's care.

## 2016-02-25 ENCOUNTER — Inpatient Hospital Stay: Admission: RE | Admit: 2016-02-25 | Payer: Self-pay | Source: Ambulatory Visit

## 2016-02-25 NOTE — Telephone Encounter (Signed)
We can change the diagnosis to diffuse abdominal pain. Then resubmit for CT abdomen pelvis.

## 2016-02-29 NOTE — Telephone Encounter (Signed)
The patient's resubmitted order for a CT Abdomen Pelvis has been denied.  A provider can call to give additional clinical information at [phone].  When prompted, press 1 for Provider.  When prompted again, press 7 for Appeals/Provider Courtesy Review.  The patient is scheduled for the scan on 03/03/16.  Unfortunately, it will have to be cancelled if we cannot get insurance approval.

## 2016-03-01 NOTE — Telephone Encounter (Signed)
Hold for Dr Everlene Farrier to review and call when he returns.

## 2016-03-02 NOTE — Telephone Encounter (Signed)
I have copied this note and will make an appeal for scan to be done.

## 2016-03-03 ENCOUNTER — Inpatient Hospital Stay: Admission: RE | Admit: 2016-03-03 | Payer: Self-pay | Source: Ambulatory Visit

## 2016-03-07 NOTE — Telephone Encounter (Signed)
Patient is calling to follow up and see if the appeal has been completed.

## 2016-03-08 ENCOUNTER — Telehealth: Payer: Self-pay

## 2016-03-08 NOTE — Telephone Encounter (Signed)
LMOVM for patient to call back in reference to Dr. Perfecto Kingdom message. He wants patient to know that her insurance company has denied her CT Scan so he would like to know if she prefers a referral to [facility] or would she like to come into the office to discuss her current situation with him.

## 2016-03-08 NOTE — Telephone Encounter (Signed)
Call patient they will not approve for her to have a CT. Would she like to see the gastroenterologist?. I can make an appointment. If not I will be happy to see her at [facility] and discuss options.

## 2016-03-09 ENCOUNTER — Other Ambulatory Visit: Payer: Self-pay

## 2016-03-09 NOTE — Telephone Encounter (Signed)
She is coming in on Tuesday.

## 2016-03-09 NOTE — Telephone Encounter (Signed)
Pt advised and will see Dr. Everlene Farrier next Tuesday.

## 2016-03-14 ENCOUNTER — Ambulatory Visit (INDEPENDENT_AMBULATORY_CARE_PROVIDER_SITE_OTHER): Payer: BC Managed Care – PPO | Admitting: Emergency Medicine

## 2016-03-14 VITALS — BP 132/90 | HR 52 | Temp 98.1°F | Resp 17 | Ht 62.0 in | Wt 249.0 lb

## 2016-03-14 DIAGNOSIS — R1084 Generalized abdominal pain: Secondary | ICD-10-CM

## 2016-03-14 DIAGNOSIS — R002 Palpitations: Secondary | ICD-10-CM | POA: Diagnosis not present

## 2016-03-14 DIAGNOSIS — R195 Other fecal abnormalities: Secondary | ICD-10-CM

## 2016-03-14 DIAGNOSIS — R001 Bradycardia, unspecified: Secondary | ICD-10-CM | POA: Diagnosis not present

## 2016-03-14 NOTE — Patient Instructions (Addendum)
I have made a referral for you to see the cardiologist. I have rescheduled you to have a CT abdomen pelvis to evaluate your abdominal pain and mucus in your stools    IF you received an x-ray today, you will receive an invoice from [facility]. Please contact [facility] Radiology at [phone] with questions or concerns regarding your invoice.   IF you received labwork today, you will receive an invoice from [employer]. Please contact [facility] at [phone] with questions or concerns regarding your invoice.   Our billing staff will not be able to assist you with questions regarding bills from these companies.  You will be contacted with the lab results as soon as they are available. The fastest way to get your results is to activate your My Chart account. Instructions are located on the last page of this paperwork. If you have not heard from [place] regarding the results in 2 weeks, please contact this office.     We recommend that you schedule a mammogram for breast cancer screening. Typically, you do not need a referral to do this. Please contact a local imaging center to schedule your mammogram.  [facility] - [phone]  *ask for the Radiology Department [facility] - [phone] or [phone]  [facility] - [phone] [place] [phone] [facility] - [phone]  *ask for the [facility] - [phone]  *ask for the Radiology Department [facility] - [phone]  *ask for the [place] - [phone]

## 2016-03-14 NOTE — Progress Notes (Addendum)
By signing my name below, I, [name], attest that this documentation has been prepared under the direction and in the presence of [name], MD.  Electronically Signed: [name], Medical Scribe. 03/14/2016. 9:26 AM.  Chief Complaint:  Chief Complaint  Patient presents with  . Follow-up    IBS,   . Medication Refill  . Chest Pain    last night     HPI: [name] is a 65 y.o. female who reports to [facility] today for a follow-up. Pt is complaining of intermittent abdominal pain that occurs 2-3 times a month. Pt reports sinus drainage, and loose stool with mucus in it. Pt thinks it's triggered by stress. Pt's GI doctor is Dr. Almond Lint.  Pt reports chest pain and describes it as a palpitation with a fast heavy pace onset 3 months. This has happened 3 times already. The most recent episode occured last night, and it last for about an hour. Pt takes half of 50mg  of atenolol. Pt thinks it's triggered by stress. Pt doesn't have a cardiologist.The pain she experienced was not really pain she just felt her heart was beating fast and was worried this could become chest pain.  Pt thinks she's stressed from taking care of her mom and her sister not helping her take care of her mom. She feels that almost all of her symptoms are stress triggered.  Past Medical History  Diagnosis Date  . Hypertension   . Allergy   . Arthritis   . Anemia   . Anxiety   . GERD (gastroesophageal reflux disease)   . Obesity   . DDD (degenerative disc disease), lumbar   . Blood in stool   . Headache    Past Surgical History  Procedure Laterality Date  . Spine surgery    . Abdominal hysterectomy    . Tonsillectomy and adenoidectomy    . Colonoscopy  2014  . Radiology with anesthesia N/A 02/25/2015    Procedure: MRI OF LUMBAR SPINE;  Surgeon: Medication Radiologist, MD;  Location: [facility];  Service: Radiology;  Laterality: N/A;  DR. Bennie Pierini COHEN/MRI   Social History   Social History  . Marital  Status: Widowed    Spouse Name: N/A  . Number of Children: N/A  . Years of Education: N/A   Social History Main Topics  . Smoking status: Former Smoker -- 1.00 packs/day for 38 years    Types: Cigarettes    Quit date: 10/28/1997  . Smokeless tobacco: Never Used  . Alcohol Use: No  . Drug Use: No  . Sexual Activity: No   Other Topics Concern  . 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   Family History  Problem Relation Age of Onset  . Hypertension Mother   . Hyperlipidemia Mother   . Hyperlipidemia Father   . Hypertension Father   . Arthritis Father   . Hyperlipidemia Sister   . Cervical cancer Sister   . Diabetes Sister   . Heart disease Sister   . Cancer Maternal Grandfather   . Hypertension Paternal Grandmother   . Pancreatic cancer Maternal Aunt   . Breast cancer Paternal Aunt   . Breast cancer Cousin    Allergies  Allergen Reactions  . Crestor [Rosuvastatin]   . Erythromycin     rash  . Lansoprazole   . Penicillins     rash  . Potassium-Containing Compounds Other (See Comments)    Stomach upset   .  Pravastatin   . Robaxin [Methocarbamol]     Messed up stomach  . Sulfonamide Derivatives     rash  . Trental [Pentoxifylline Er]     Bad headache   Prior to Admission medications   Medication Sig Start Date End Date Taking? Authorizing Provider  Aspirin-Caffeine 845-65 MG PACK Take 1-2 packets by mouth daily as needed (headache).    Historical Provider, MD  atenolol (TENORMIN) 50 MG tablet Take 1 tablet (50 mg total) by mouth daily. 09/22/15   [name], MD  azelastine (ASTELIN) 0.1 % nasal spray Place 2 sprays into both nostrils 2 (two) times daily. Use in each nostril as directed 04/23/15   [name], MD  calcium carbonate (TUMS EX) 750 MG chewable tablet Chew 2 tablets by mouth daily as needed for heartburn.    Historical Provider, MD  cetirizine (ZYRTEC) 10 MG tablet Take 2.5 mg by mouth daily as needed for  allergies.    Historical Provider, MD  cyclobenzaprine (FLEXERIL) 10 MG tablet Take 2.5 mg by mouth daily as needed (pain level 8 or greater).     Historical Provider, MD  diclofenac sodium (VOLTAREN) 1 % GEL Apply 4 g topically 4 (four) times daily as needed.     Historical Provider, MD  dimenhyDRINATE (DRAMAMINE) 50 MG tablet Take 50 mg by mouth every 8 (eight) hours as needed for nausea. Reported on 01/12/2016    Historical Provider, MD  EPINEPHrine (EPIPEN) 0.3 mg/0.3 mL DEVI Inject 0.3 mLs (0.3 mg total) into the muscle once. Patient not taking: Reported on 02/17/2016 01/30/13   [name], MD  fluticasone (FLONASE) 50 MCG/ACT nasal spray USE TWO SPRAY (S) IN EACH NOSTRIL ONCE DAILY. 05/19/15   Mancel Bale, PA-C  hydrochlorothiazide (HYDRODIURIL) 25 MG tablet Take 1 tablet (25 mg total) by mouth daily. 09/13/15   [name], MD  HYDROcodone-acetaminophen (NORCO/VICODIN) 5-325 MG per tablet Take 0.25 tablets by mouth daily as needed (pain level 8 or greater).  01/13/15   Historical Provider, MD  hydrocortisone 2.5 % ointment APPLY TWICE A DAY 09/16/13   [name], MD  levofloxacin (LEVAQUIN) 500 MG tablet Take 1 tablet (500 mg total) by mouth daily. 01/12/16   Robyn Haber, MD  LORazepam (ATIVAN) 0.5 MG tablet Take 1 tablet (0.5 mg total) by mouth 2 (two) times daily as needed for anxiety. 02/17/16   [name], MD  meloxicam (MOBIC) 15 MG tablet Take 3.75 mg by mouth daily as needed (pain level 8 or greater).     Historical Provider, MD  Multiple Vitamins-Minerals (MULTI-VITAMIN GUMMIES PO) Take by mouth as needed.    Historical Provider, MD  omeprazole (PRILOSEC) 40 MG capsule Take 40 mg by mouth daily.    Historical Provider, MD  potassium chloride SA (K-DUR,KLOR-CON) 20 MEQ tablet Take 1 tablet (20 mEq total) by mouth daily. 04/23/15   [name], MD  Tetrahydrozoline HCl (VISINE OP) Place 3 drops into both eyes daily as needed (allergies). Reported on 01/12/2016    Historical Provider,  MD     ROS: The patient denies fevers, chills, night sweats, unintentional weight loss,  wheezing, dyspnea on exertion, nausea, vomiting, dysuria, hematuria, melena, numbness, weakness, or tingling. Pt reports chest pain, palpitations, and abdominal pain.  All other systems have been reviewed and were otherwise negative with the exception of those mentioned in the HPI and as above.    PHYSICAL EXAM: Filed Vitals:   03/14/16 0906  BP: 132/90  Pulse: 52  Temp: 98.1 F (36.7 C)  Resp: 17   Body mass index is 45.53 kg/(m^2).   General: Alert, no acute distress HEENT:  Normocephalic, atraumatic, oropharynx patent. Eye: [facility] Cardiovascular:  Regular rate and rhythm, no rubs murmurs or gallops.  No Carotid bruits, radial pulse intact. No pedal edema.  Respiratory: Clear to auscultation bilaterally.  No wheezes, rales, or rhonchi.  No cyanosis, no use of accessory musculature Abdominal: No organomegaly, abdomen is soft and non-tender, positive bowel sounds.  No masses. Musculoskeletal: Gait intact. No edema, tenderness Skin: No rashes. Neurologic: Facial musculature symmetric. Psychiatric: Patient acts appropriately throughout our interaction. Lymphatic: No cervical or submandibular lymphadenopathy   LABS:    EKG/XRAY:   Primary read interpreted by Dr. Everlene Farrier at [facility].  EKG Reading: Sinus bradycardia rate 46  ASSESSMENT/PLAN: Patient is on atenolol. Patient is showing a marked sinus bradycardia in the 46 on EKG. She has had palpitations but no syncopal episodes. Referral made to cardiology. She could have sick sinus syndrome with episodes of bradycardia and tachycardia. She also continues to have intermittent episodes of abdominal pain. She describes mucus in her stools at times. Will try and get approval for CT abdomen pelvis with contrast which has been denied in the past will be to rule out colitis.I personally performed the services described in this documentation, which  was scribed in my presence. The recorded information has been reviewed and is accurate.   Gross sideeffects, risk and benefits, and alternatives of medications d/w patient. Patient is aware that all medications have potential sideeffects and we are unable to predict every sideeffect or drug-drug interaction that may occur.  [name] MD 03/14/2016 9:26 AM

## 2016-03-15 ENCOUNTER — Other Ambulatory Visit: Payer: Self-pay

## 2016-03-15 DIAGNOSIS — E876 Hypokalemia: Secondary | ICD-10-CM

## 2016-03-15 DIAGNOSIS — I10 Essential (primary) hypertension: Secondary | ICD-10-CM

## 2016-03-15 DIAGNOSIS — R21 Rash and other nonspecific skin eruption: Secondary | ICD-10-CM

## 2016-03-15 NOTE — Telephone Encounter (Signed)
Pt is needing her blood pressure and flonase refilled she has the fluid pill yesterday   Best number [phone]

## 2016-03-16 MED ORDER — ATENOLOL 50 MG PO TABS: 50.0000 mg | ORAL_TABLET | Freq: Every day | ORAL | Status: DC

## 2016-03-16 MED ORDER — HYDROCORTISONE 2.5 % EX OINT: TOPICAL_OINTMENT | CUTANEOUS | Status: DC

## 2016-03-16 MED ORDER — FLUTICASONE PROPIONATE 50 MCG/ACT NA SUSP: NASAL | Status: DC

## 2016-03-16 MED ORDER — HYDROCHLOROTHIAZIDE 25 MG PO TABS: 25.0000 mg | ORAL_TABLET | Freq: Every day | ORAL | Status: DC

## 2016-03-16 MED ORDER — POTASSIUM CHLORIDE CRYS ER 20 MEQ PO TBCR: 20.0000 meq | EXTENDED_RELEASE_TABLET | Freq: Every day | ORAL | Status: DC

## 2016-03-16 NOTE — Telephone Encounter (Signed)
Called pt and refilled 3 meds she requested. Pt stated that she had wanted RFs of all of her meds at [place], and would also like her K+ and hydrocortisone cream she uses prn eczema. Dr Everlene Farrier, I have pended both of these for your review, since not discussed at [place] that I see. K+ lab was normal at 3.8 on 02/17/16.

## 2016-03-24 ENCOUNTER — Other Ambulatory Visit: Payer: Self-pay

## 2016-04-10 ENCOUNTER — Telehealth: Payer: Self-pay

## 2016-04-10 NOTE — Telephone Encounter (Signed)
Dr Daub? 

## 2016-04-10 NOTE — Telephone Encounter (Signed)
The patient called for a status update on her referral to cardiology.  I checked her chart, and there is no referral in the system for cardiology.  Please advise, thank you.  CB#: [phone]

## 2016-04-11 ENCOUNTER — Other Ambulatory Visit: Payer: Self-pay | Admitting: Emergency Medicine

## 2016-04-11 DIAGNOSIS — R002 Palpitations: Secondary | ICD-10-CM

## 2016-04-11 DIAGNOSIS — R079 Chest pain, unspecified: Secondary | ICD-10-CM

## 2016-04-13 ENCOUNTER — Other Ambulatory Visit: Payer: Self-pay

## 2016-07-24 DIAGNOSIS — Z1231 Encounter for screening mammogram for malignant neoplasm of breast: Secondary | ICD-10-CM | POA: Diagnosis not present

## 2016-07-24 LAB — HM MAMMOGRAPHY

## 2016-07-26 ENCOUNTER — Ambulatory Visit (INDEPENDENT_AMBULATORY_CARE_PROVIDER_SITE_OTHER): Payer: BC Managed Care – PPO | Admitting: Family Medicine

## 2016-07-26 ENCOUNTER — Encounter: Payer: Self-pay | Admitting: Family Medicine

## 2016-07-26 VITALS — BP 134/80 | HR 65 | Resp 12 | Ht 62.0 in | Wt 250.2 lb

## 2016-07-26 DIAGNOSIS — E876 Hypokalemia: Secondary | ICD-10-CM | POA: Diagnosis not present

## 2016-07-26 DIAGNOSIS — Z23 Encounter for immunization: Secondary | ICD-10-CM | POA: Diagnosis not present

## 2016-07-26 DIAGNOSIS — Z6841 Body Mass Index (BMI) 40.0 and over, adult: Secondary | ICD-10-CM

## 2016-07-26 DIAGNOSIS — K219 Gastro-esophageal reflux disease without esophagitis: Secondary | ICD-10-CM

## 2016-07-26 DIAGNOSIS — J309 Allergic rhinitis, unspecified: Secondary | ICD-10-CM

## 2016-07-26 DIAGNOSIS — K589 Irritable bowel syndrome without diarrhea: Secondary | ICD-10-CM

## 2016-07-26 DIAGNOSIS — I10 Essential (primary) hypertension: Secondary | ICD-10-CM

## 2016-07-26 DIAGNOSIS — K582 Mixed irritable bowel syndrome: Secondary | ICD-10-CM

## 2016-07-26 HISTORY — DX: Irritable bowel syndrome, unspecified: K58.9

## 2016-07-26 HISTORY — DX: Body Mass Index (BMI) 40.0 and over, adult: Z684

## 2016-07-26 HISTORY — DX: Hypokalemia: E87.6

## 2016-07-26 LAB — BASIC METABOLIC PANEL
BUN: 16 mg/dL (ref 6–23)
CO2: 30 mEq/L (ref 19–32)
CREATININE: 0.96 mg/dL (ref 0.40–1.20)
Calcium: 10 mg/dL (ref 8.4–10.5)
Chloride: 101 mEq/L (ref 96–112)
GFR: 75.49 mL/min (ref >60.00)
Glucose, Bld: 112 mg/dL — ABNORMAL HIGH (ref 70–99)
Potassium: 3.8 mEq/L (ref 3.5–5.1)
Sodium: 141 mEq/L (ref 135–145)

## 2016-07-26 MED ORDER — HYDROCODONE-ACETAMINOPHEN 5-325 MG PO TABS: 0.2500 | ORAL_TABLET | Freq: Every day | ORAL | 0 refills | Status: AC | PRN

## 2016-07-26 NOTE — Progress Notes (Signed)
Pre visit review using our clinic review tool, if applicable. No additional management support is needed unless otherwise documented below in the visit note. 

## 2016-07-26 NOTE — Progress Notes (Signed)
HPI:   [name] is a 65 y.o. female, who is here today to establish care with me.  Former PCP: Dr [name]  Last preventive routine visit: 02/2016.  Concerns today: cholesterol medication.   No history of hyperlipidemia, she thinks she should be on statin medication because her mother had a stroke recently. She denies any history of DM 2, even though it is listed on her problem list. She is not exercising regularly because history of lower back pain, she follows with orthopedist and currently she is on hydrocodone acetaminophen 5-325.  She is also reporting history of IBS, episodes of constipation and diarrhea, she states that she has diarrhea about once per week and takes OTC Imodium if needed. She is also on MiraLAX as needed for constipation.  History of GERD, which is not well controlled with Omeprazole 40 mg, still having episodes of heartburn even though she tries to avoid food that came exacerbate symptoms. According to patient she was on Nexium before and this one seemed to be much better, discontinue by orthopedist because it was affecting her "bone density."  She lives with her mother, caregiver. She doesn't follow a healthful diet consistently.  Hypertension: Currently on HCTZ 25 mg daily and Atenolol 50 mg daily. Reporting no side effects from medication. She denies any frequent/severe headache, visual changes, chest pain, dyspnea, palpitation, abdominal pain, nausea, vomiting, or edema.  HypoK+ , she takes KCL 20 meq not consistently, 1-2 times per week.   Lab Results  Component Value Date   CHOL 189 02/17/2016   HDL 47 02/17/2016   LDLCALC 120 02/17/2016   TRIG 108 02/17/2016   CHOLHDL 4.0 02/17/2016   Also history of allergic rhinitis, she is on Flonase nasal spray and Astelin nasal spray. She also takes OTC Zyrtec but she is not sure if it is  still helping (rhinorrhea, itchy), she would like to know which other medication she could try. She  denies fever, chills, or sick contact.    Review of Systems  Constitutional: Negative for activity change, appetite change, fatigue, fever and unexpected weight change.  HENT: Positive for rhinorrhea. Negative for mouth sores, nosebleeds, sinus pressure, sore throat and trouble swallowing.   Eyes: Positive for itching. Negative for redness and visual disturbance.  Respiratory: Negative for cough, shortness of breath and wheezing.   Cardiovascular: Negative for chest pain, palpitations and leg swelling.  Gastrointestinal: Positive for constipation and diarrhea. Negative for abdominal pain, blood in stool, nausea and vomiting.       Negative for changes in bowel habits.  Endocrine: Negative for cold intolerance, heat intolerance, polydipsia, polyphagia and polyuria.  Genitourinary: Negative for decreased urine volume, difficulty urinating, dysuria and hematuria.  Musculoskeletal: Positive for arthralgias and back pain. Negative for gait problem.  Skin: Negative for rash.  Allergic/Immunologic: Positive for environmental allergies.  Neurological: Negative for syncope, weakness and headaches.  Psychiatric/Behavioral: Negative for confusion. The patient is nervous/anxious.       Current Outpatient Prescriptions on File Prior to Visit  Medication Sig Dispense Refill  . Aspirin-Caffeine 845-65 MG PACK Take 1-2 packets by mouth daily as needed (headache).    Marland Kitchen atenolol (TENORMIN) 50 MG tablet Take 1 tablet (50 mg total) by mouth daily. 90 tablet 1  . azelastine (ASTELIN) 0.1 % nasal spray Place 2 sprays into both nostrils 2 (two) times daily. Use in each nostril as directed 30 mL 11  . calcium carbonate (TUMS EX) 750 MG chewable tablet Chew  2 tablets by mouth daily as needed for heartburn.    . cetirizine (ZYRTEC) 10 MG tablet Take 2.5 mg by mouth daily as needed for allergies.    . cyclobenzaprine (FLEXERIL) 10 MG tablet Take 2.5 mg by mouth daily as needed (pain level 8 or greater).     Marland Kitchen  dimenhyDRINATE (DRAMAMINE) 50 MG tablet Take 50 mg by mouth every 8 (eight) hours as needed for nausea. Reported on 01/12/2016    . fluticasone (FLONASE) 50 MCG/ACT nasal spray USE TWO SPRAY (S) IN EACH NOSTRIL ONCE DAILY. 48 g 2  . hydrochlorothiazide (HYDRODIURIL) 25 MG tablet Take 1 tablet (25 mg total) by mouth daily. 90 tablet 1  . hydrocortisone 2.5 % ointment APPLY TWICE A DAY 60 g 11  . LORazepam (ATIVAN) 0.5 MG tablet Take 1 tablet (0.5 mg total) by mouth 2 (two) times daily as needed for anxiety. 30 tablet 1  . Multiple Vitamins-Minerals (MULTI-VITAMIN GUMMIES PO) Take by mouth as needed.    Marland Kitchen omeprazole (PRILOSEC) 40 MG capsule Take 40 mg by mouth daily.    . potassium chloride SA (K-DUR,KLOR-CON) 20 MEQ tablet Take 1 tablet (20 mEq total) by mouth daily. 90 tablet 3  . Tetrahydrozoline HCl (VISINE OP) Place 3 drops into both eyes daily as needed (allergies). Reported on 01/12/2016     No current facility-administered medications on file prior to visit.      Past Medical History:  Diagnosis Date  . Allergy   . Anemia   . Anxiety   . Arthritis   . Blood in stool   . DDD (degenerative disc disease), lumbar   . GERD (gastroesophageal reflux disease)   . Headache   . Hypertension   . Obesity    Allergies  Allergen Reactions  . Crestor [Rosuvastatin]   . Erythromycin     rash  . Lansoprazole   . Penicillins     rash  . Potassium-Containing Compounds Other (See Comments)    Stomach upset   . Pravastatin   . Robaxin [Methocarbamol]     Messed up stomach  . Sulfonamide Derivatives     rash  . Trental [Pentoxifylline Er]     Bad headache    Family History  Problem Relation Age of Onset  . Hypertension Mother   . Hyperlipidemia Mother   . Hyperlipidemia Father   . Hypertension Father   . Arthritis Father   . Hyperlipidemia Sister   . Cervical cancer Sister   . Diabetes Sister   . Heart disease Sister   . Cancer Maternal Grandfather   . Hypertension Paternal  Grandmother   . Pancreatic cancer Maternal Aunt   . Breast cancer Paternal Aunt   . Breast cancer Cousin     Social History   Social History  . Marital status: Widowed    Spouse name: N/A  . Number of children: N/A  . Years of education: N/A   Social History Main Topics  . Smoking status: Former Smoker    Packs/day: 1.00    Years: 38.00    Types: Cigarettes    Quit date: 10/28/1997  . Smokeless tobacco: Never Used  . Alcohol use No  . Drug use: No  . Sexual activity: No   Other Topics Concern  . 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    Vitals:   07/26/16 0813  BP: 134/80  Pulse: 65  Resp: 12   O2 sat 98% at RA.  Body mass index is 45.77 kg/m.    Physical Exam  Nursing note and vitals reviewed. Constitutional: She is oriented to person, place, and time. She appears well-developed. No distress.  HENT:  Head: Atraumatic.  Nose: Rhinorrhea present.  Mouth/Throat: Oropharynx is clear and moist and mucous membranes are normal.  Eyes: Conjunctivae and EOM are normal. Pupils are equal, round, and reactive to light.  Neck: No tracheal deviation present. No thyroid mass and no thyromegaly present.  Cardiovascular: Normal rate and regular rhythm.   No murmur heard. Pulses:      Dorsalis pedis pulses are 2+ on the right side, and 2+ on the left side.  Respiratory: Effort normal and breath sounds normal. No respiratory distress.  GI: Soft. She exhibits no mass. There is no hepatomegaly. There is no tenderness.  Musculoskeletal: She exhibits edema (1+ pitting LE edema bilateral). She exhibits no tenderness.  Neurological: She is alert and oriented to person, place, and time. She has normal strength. Coordination normal.  Skin: Skin is warm. No erythema.  Psychiatric: Her speech is normal. Her mood appears anxious.  Well groomed, good eye contact.      ASSESSMENT AND PLAN:     Marvyl was seen today for establish  care.  Diagnoses and all orders for this visit:  Gastroesophageal reflux disease, esophagitis presence not specified  Not well controlled. GERD precautions discussed. I recommend going back to Nexium 40 mg daily, according to patient she case PPI prescription from orthopedist because it is related to [employer]. According to patient, symptoms have been attributed to chronic opioid use. We discussed some side effects of PPIs in general as well as possible complications from persistent acid reflux. Weight loss might also help.  Essential hypertension  Adequately controlled. No changes in current management. DASH-low salt diet recommended. Eye exam recommended annually. F/U in 6 months, before if needed.   -     Basic Metabolic Panel  Hypokalemia  Some side effects of HCTZ treatment discussed. Further recommendations in regard to continuation of KCL will be given according to lab results. I explained we may need to decrease or discontinue diuretic depending of lab results.  -     Basic Metabolic Panel  Irritable bowel syndrome with both constipation and diarrhea  I recommended avoiding OTC Imodium since episodes of diarrhea do not seem severe or frequent. She has tried Metamucil in the past and it aggravated constipation. Continue MiraLAX OTC as needed for constipation. Some side effects of opioid medications also discussed.  Need for immunization against influenza -     Flu Vaccine QUAD 36+ mos IM  BMI 45.0-49.9, adult ([place])  We discussed benefits of wt loss as well as adverse effects of obesity. Consistency with healthy diet and physical activity recommended. Weight Watchers is a good option as well as daily brisk walking for 15-30 min as tolerated.   Allergic rhinitis, unspecified chronicity, unspecified seasonality, unspecified trigger  Continue Flonase and Astelin nasal sprays. Stop Zyrtec OTC. Allegra 180 mg recommended. Follow-up as needed.   In regard to  statin medication I do not think it is needed at this time. Recommend dietary changes, regular physical activity, low fat diet with little or no red meat. Last lipid panel in May 2017 was in normal range.    [name]. [name], MD  [facility]. [place] office.

## 2016-07-26 NOTE — Patient Instructions (Addendum)
A few things to remember from today's visit:   Need for immunization against influenza - Plan: Flu Vaccine QUAD 36+ mos IM  Essential hypertension  Hypokalemia  Gastroesophageal reflux disease, esophagitis presence not specified  Irritable bowel syndrome with both constipation and diarrhea  What are some tips for weight loss? People become overweight for many reasons. Weight issues can run in families. They can be caused by unhealthy behaviors and a person's environment. Certain health problems and medicines can also lead to weight gain. There are some simple things you can do to reach and maintain a healthy weight:  Eat small more frequent healthy meals instead 3 bid meals. Also Weight Watchers is a good option. Avoid sweet drinks. These include regular soft drinks, fruit juices, fruit drinks, energy drinks, sweetened iced tea, and flavored milk. Avoid fast foods. Fast foods such as french fries, hamburgers, chicken nuggets, and pizza are high in calories and can cause weight gain. Eat a healthy breakfast. People who skip breakfast tend to weigh more. Don't watch more than two hours of television per day. Chew sugar-free gum between meals to cut down on snacking. Avoid grocery shopping when you're hungry. Pack a healthy lunch instead of eating out to control what and how much you eat. Eat a lot of fruits and vegetables. Aim for about 2 cups of fruit and 2 to 3 cups of vegetables per day. Aim for 150 minutes per week of moderate-intensity exercise (such as brisk walking), or 75 minutes per week of vigorous exercise (such as jogging or running). OR 15-30 min of daily brisk walking. Be more active. Small changes in physical activity can easily be added to your daily routine. For example, take the stairs instead of the elevator. Take a walk with your family. A daily walk is a great way to get exercise and to catch up on the day's events.    Avoid foods that make your symptoms worse, for  example coffee, chocolate,pepermeint,alcohol, and greasy food. Raising the head of your bed about 6 inches may help with nocturnal symptoms.   Weight loss (if you are overweight). Avoid lying down for 3 hours after eating.  Instead 3 large meals daily try small and more frequent meals during the day.  Some medications we recommend for acid reflux treatment (proton pump inhibitors) can cause some problems in the long term: increase risk of osteoporosis (?), vitamin deficiencies,pneumonia, and more recently discovered that it can increase the risk of chronic kidney disease but GERD symptoms can also cause complications.    You should be evaluated immediately if bloody vomiting, bloody stools, black stools (like tar), difficulty swallowing, food gets stuck on the way down or choking when eating. Abnormal weight loss or severe abdominal pain.  If symptoms are not resolved sometimes endoscopy is necessary. I recommend changing to Nexium again.    Please be sure medication list is accurate. If a new problem present, please set up appointment sooner than planned today.

## 2016-09-07 ENCOUNTER — Telehealth: Payer: Self-pay | Admitting: Family Medicine

## 2016-09-07 DIAGNOSIS — I10 Essential (primary) hypertension: Secondary | ICD-10-CM

## 2016-09-07 MED ORDER — ATENOLOL 50 MG PO TABS: 50.0000 mg | ORAL_TABLET | Freq: Every day | ORAL | 1 refills | Status: DC

## 2016-09-07 NOTE — Telephone Encounter (Signed)
Rx sent 

## 2016-09-07 NOTE — Telephone Encounter (Signed)
Pt request refill  atenolol (TENORMIN) 50 MG tablet 90 day supply  [facility]

## 2016-09-30 ENCOUNTER — Other Ambulatory Visit: Payer: Self-pay | Admitting: Emergency Medicine

## 2016-09-30 DIAGNOSIS — I10 Essential (primary) hypertension: Secondary | ICD-10-CM

## 2016-11-27 ENCOUNTER — Ambulatory Visit (INDEPENDENT_AMBULATORY_CARE_PROVIDER_SITE_OTHER): Payer: BC Managed Care – PPO | Admitting: Family Medicine

## 2016-11-27 ENCOUNTER — Ambulatory Visit: Payer: Self-pay | Admitting: Family Medicine

## 2016-11-27 ENCOUNTER — Encounter: Payer: Self-pay | Admitting: Family Medicine

## 2016-11-27 VITALS — BP 130/78 | HR 54 | Resp 12 | Ht 62.0 in | Wt 253.1 lb

## 2016-11-27 DIAGNOSIS — R350 Frequency of micturition: Secondary | ICD-10-CM

## 2016-11-27 DIAGNOSIS — Z6841 Body Mass Index (BMI) 40.0 and over, adult: Secondary | ICD-10-CM

## 2016-11-27 DIAGNOSIS — I10 Essential (primary) hypertension: Secondary | ICD-10-CM

## 2016-11-27 DIAGNOSIS — R1030 Lower abdominal pain, unspecified: Secondary | ICD-10-CM

## 2016-11-27 DIAGNOSIS — R739 Hyperglycemia, unspecified: Secondary | ICD-10-CM

## 2016-11-27 DIAGNOSIS — J309 Allergic rhinitis, unspecified: Secondary | ICD-10-CM

## 2016-11-27 DIAGNOSIS — R001 Bradycardia, unspecified: Secondary | ICD-10-CM | POA: Diagnosis not present

## 2016-11-27 DIAGNOSIS — J329 Chronic sinusitis, unspecified: Secondary | ICD-10-CM

## 2016-11-27 DIAGNOSIS — K581 Irritable bowel syndrome with constipation: Secondary | ICD-10-CM

## 2016-11-27 DIAGNOSIS — K219 Gastro-esophageal reflux disease without esophagitis: Secondary | ICD-10-CM

## 2016-11-27 LAB — BASIC METABOLIC PANEL
BUN: 14 mg/dL (ref 6–23)
CO2: 28 mEq/L (ref 19–32)
CREATININE: 0.95 mg/dL (ref 0.40–1.20)
Calcium: 9.9 mg/dL (ref 8.4–10.5)
Chloride: 104 mEq/L (ref 96–112)
GFR: 76.32 mL/min (ref >60.00)
GLUCOSE: 105 mg/dL — AB (ref 70–99)
POTASSIUM: 3.8 meq/L (ref 3.5–5.1)
Sodium: 142 mEq/L (ref 135–145)

## 2016-11-27 LAB — POCT URINALYSIS DIPSTICK
BILIRUBIN UA: NEGATIVE
Glucose, UA: NEGATIVE
Ketones, UA: NEGATIVE
LEUKOCYTES UA: NEGATIVE
NITRITE UA: NEGATIVE
PH UA: 6
PROTEIN UA: NEGATIVE
RBC UA: NEGATIVE
Spec Grav, UA: 1.025
Urobilinogen, UA: 0.2

## 2016-11-27 LAB — LIPID PANEL
CHOL/HDL RATIO: 4
Cholesterol: 195 mg/dL (ref 0–200)
HDL: 47.4 mg/dL (ref >39.00)
LDL Cholesterol: 128 mg/dL — ABNORMAL HIGH (ref 0–99)
NONHDL: 147.89
Triglycerides: 100 mg/dL (ref 0.0–149.0)
VLDL: 20 mg/dL (ref 0.0–40.0)

## 2016-11-27 LAB — HEMOGLOBIN A1C: HEMOGLOBIN A1C: 6.6 % — AB (ref 4.6–6.5)

## 2016-11-27 MED ORDER — ATENOLOL 50 MG PO TABS: 25.0000 mg | ORAL_TABLET | Freq: Every day | ORAL | 1 refills | Status: DC

## 2016-11-27 MED ORDER — MONTELUKAST SODIUM 10 MG PO TABS: 10.0000 mg | ORAL_TABLET | Freq: Every day | ORAL | 3 refills | Status: DC

## 2016-11-27 MED ORDER — MOXIFLOXACIN HCL 400 MG PO TABS: 400.0000 mg | ORAL_TABLET | Freq: Every day | ORAL | 0 refills | Status: AC

## 2016-11-27 NOTE — Progress Notes (Signed)
Pre visit review using our clinic review tool, if applicable. No additional management support is needed unless otherwise documented below in the visit note. 

## 2016-11-27 NOTE — Progress Notes (Signed)
HPI:   [name] is a 66 y.o. female, who is here today to follow on some chronic medical problems and new concerns.   Hypertension: Currently she is on Atenolol 50 mg daily and HCTZ 25 mg daily.  Reporting no side effects from medication. She denies any frequent/severe headache, visual changes, chest pain, dyspnea, or edema.  Occasional palpitations, no associated with exertion and no associated symptoms.She states that Atenolol was added because racing HR and has helped. Reporting Hx of irregular HR and cardiac work-up done.   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 No significant dietary changes since her last office visit.    GERD: she tried Prilosec 40 mg and helping. Nexium caused boating sensation,last OV she reported that Nexium was better than Omeprazole.  She has seen Dr [name], who recommended pieces of bread to counteract "acid". She also follows with nutritionist because her "stomach issues"    Concerns today: "Sinuses" Hx of allergic rhinitis, having frontal and facial pressure,intermittently, occasional nose bleed, which relieved headache. She is not taking Zyrtec because has been on Benadryl at night to help with skin pruritus attributed to Hydrocodone. Changes in weather exacerbate symptoms. She has Hx of allergic rhinitis and she is o Flonase as needed and Astelin daily. She has not had fever. No sick contact.   -Urinary frequency:  For 2 weeks "low grade fever", lower abdominal pain, intermittent, and urinary frequency. Also c/o lower back pain, "killing me." She has hx of chronic back pain, follows with ortho. Back pain is exacerbated by certain activities,prolonged walking,standing, and upon getting up after  prolonged sitting.  Hx of IBS-C, has bowel movement q 1-2 days, does not feel like she empties completely after defecation , last bowel movement was yesterday. She takes Miralax q 2 days.  Denies nausea, vomiting, changes in bowel habits, blood in stool or melena.  Denies dysuria or gross hematuria.  -She denies history of diabetes, is listed in her problem list. A1c on 02/17/2016 was 6.1.   Review of Systems  Constitutional: Positive for fatigue and fever. Negative for activity change, appetite change and unexpected weight change.  HENT: Positive for congestion, nosebleeds and sinus pressure. Negative for mouth sores, sore throat, trouble swallowing and voice change.   Eyes: Negative for pain, redness and visual disturbance.  Respiratory: Negative for cough, shortness of breath and wheezing.   Cardiovascular: Negative for chest pain and leg swelling.  Gastrointestinal: Positive for abdominal pain and constipation. Negative for blood in stool, nausea and vomiting.       Negative for changes in bowel habits.  Endocrine: Negative for polydipsia, polyphagia and polyuria.  Genitourinary: Positive for frequency. Negative for decreased urine volume, difficulty urinating, dysuria and hematuria.  Musculoskeletal: Positive for back pain. Negative for gait problem and myalgias.  Allergic/Immunologic: Positive for environmental allergies.  Neurological: Positive for headaches. Negative for syncope and weakness.  Hematological: Negative for adenopathy. Does not bruise/bleed easily.  Psychiatric/Behavioral: Negative for confusion. The patient is nervous/anxious.       Current Outpatient Prescriptions on File Prior to Visit  Medication Sig Dispense Refill  . Aspirin-Caffeine 845-65 MG PACK Take 1-2 packets by mouth daily as needed (headache).    Marland Kitchen  azelastine (ASTELIN) 0.1 % nasal spray Place 2 sprays into both nostrils 2 (two) times daily. Use in each nostril as directed 30 mL 11  . calcium  carbonate (TUMS EX) 750 MG chewable tablet Chew 2 tablets by mouth daily as needed for heartburn.    . cyclobenzaprine (FLEXERIL) 10 MG tablet Take 2.5 mg by mouth daily as needed (pain level 8 or greater).     Marland Kitchen dimenhyDRINATE (DRAMAMINE) 50 MG tablet Take 50 mg by mouth every 8 (eight) hours as needed for nausea. Reported on 01/12/2016    . fluticasone (FLONASE) 50 MCG/ACT nasal spray USE TWO SPRAY (S) IN EACH NOSTRIL ONCE DAILY. 48 g 2  . hydrochlorothiazide (HYDRODIURIL) 25 MG tablet Take 1 tablet (25 mg total) by mouth daily. 90 tablet 1  . HYDROcodone-acetaminophen (NORCO/VICODIN) 5-325 MG tablet Take 0.5 tablets by mouth daily as needed (pain level 8 or greater). 30 tablet 0  . hydrocortisone 2.5 % ointment APPLY TWICE A DAY 60 g 11  . LORazepam (ATIVAN) 0.5 MG tablet Take 1 tablet (0.5 mg total) by mouth 2 (two) times daily as needed for anxiety. 30 tablet 1  . Multiple Vitamins-Minerals (MULTI-VITAMIN GUMMIES PO) Take by mouth as needed.    Marland Kitchen omeprazole (PRILOSEC) 40 MG capsule Take 40 mg by mouth daily.    . potassium chloride SA (K-DUR,KLOR-CON) 20 MEQ tablet Take 1 tablet (20 mEq total) by mouth daily. 90 tablet 3   No current facility-administered medications on file prior to visit.      Past Medical History:  Diagnosis Date  . Allergy   . Anemia   . Anxiety   . Arthritis   . Blood in stool   . DDD (degenerative disc disease), lumbar   . GERD (gastroesophageal reflux disease)   . Headache   . Hypertension   . Obesity    Allergies  Allergen Reactions  . Crestor [Rosuvastatin]   . Erythromycin     rash  . Lansoprazole   . Penicillins     rash  . Potassium-Containing Compounds Other (See Comments)    Stomach upset   . Pravastatin   . Robaxin [Methocarbamol]     Messed up stomach  . Sulfonamide Derivatives     rash  . Trental [Pentoxifylline Er]     Bad headache   Family History  Problem Relation Age of Onset  . Hypertension Mother   . Hyperlipidemia Mother     . Hyperlipidemia Father   . Hypertension Father   . Arthritis Father   . Hyperlipidemia Sister   . Cervical cancer Sister   . Diabetes Sister   . Heart disease Sister   . Cancer Maternal Grandfather   . Hypertension Paternal Grandmother   . Pancreatic cancer Maternal Aunt   . Breast cancer Paternal Aunt   . Breast cancer Cousin     Social History   Social History  . Marital status: Widowed    Spouse name: N/A  . Number of children: N/A  . Years of education: N/A   Social History Main Topics  . Smoking status: Former Smoker    Packs/day: 1.00    Years: 38.00    Types: Cigarettes    Quit date: 10/28/1997  . Smokeless tobacco: Never Used  . Alcohol use No  . Drug use: No  . Sexual activity: No   Other Topics Concern  . 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    Vitals:  11/27/16 0753  BP: 130/78  Pulse: (!) 54  Resp: 12  O2 sat at RA 96%. Body mass index is 46.3 kg/m.   Wt Readings from Last 3 Encounters:  11/27/16 253 lb 2 oz (114.8 kg)  07/26/16 250 lb 4 oz (113.5 kg)  03/14/16 249 lb (112.9 kg)    Physical Exam  Nursing note and vitals reviewed. Constitutional: She is oriented to person, place, and time. She appears well-developed. No distress.  HENT:  Head: Atraumatic.  Nose: Right sinus exhibits maxillary sinus tenderness and frontal sinus tenderness. Left sinus exhibits maxillary sinus tenderness and frontal sinus tenderness.  Mouth/Throat: Oropharynx is clear and moist and mucous membranes are normal.  Dry nose mucosa, hypertrophic turbinates.  Eyes: Conjunctivae and EOM are normal. Pupils are equal, round, and reactive to light.  Neck: No tracheal deviation present.  Cardiovascular: An irregular rhythm present. Bradycardia present.   No murmur heard. Pulses:      Dorsalis pedis pulses are 2+ on the right side, and 2+ on the left side.  Hx of irregular HR  Respiratory: Effort normal and breath sounds  normal. No respiratory distress.  GI: Soft. Bowel sounds are normal. She exhibits no mass. There is no hepatomegaly. There is tenderness. There is no rigidity, no guarding and no CVA tenderness.  Musculoskeletal: She exhibits no edema.  Tenderness upon palpation of paraspinal muscles lumbar [place], bilateral.  Lymphadenopathy:    She has no cervical adenopathy.  Neurological: She is alert and oriented to person, place, and time. She has normal strength. Coordination normal.  Skin: Skin is warm. No erythema.  Psychiatric: Her mood appears anxious.  Well groomed, good eye contact.      ASSESSMENT AND PLAN:   Rande was seen today for follow-up.  Diagnoses and all orders for this visit:  Lab Results  Component Value Date   CHOL 195 11/27/2016   HDL 47.40 11/27/2016   LDLCALC 128 (H) 11/27/2016   TRIG 100.0 11/27/2016   CHOLHDL 4 11/27/2016   Lab Results  Component Value Date   HGBA1C 6.6 (H) 11/27/2016   Lab Results  Component Value Date   CREATININE 0.95 11/27/2016   BUN 14 11/27/2016   NA 142 11/27/2016   K 3.8 11/27/2016   CL 104 11/27/2016   CO2 28 11/27/2016    Lower abdominal pain  Possible causes dicussed. Hx of IBS,diverticulosis. Examination today does not suggest acute abdomen. Instructed about warning signs.  -     Culture, Urine  Gastroesophageal reflux disease, esophagitis presence not specified  Better controlled. No changes in current management. GERD precautions discussed.   Essential hypertension  Adequately controlled. Because bradycardia Atenolol was decreased. No changes in HCTZ. Recommend monitoring BP at home. DASH diet recommended. Eye exam recommended annually. F/U in 2 months, before if needed.  -     atenolol (TENORMIN) 50 MG tablet; Take 0.5 tablets (25 mg total) by mouth daily. -     Basic metabolic panel -     Hemoglobin A1c -     Lipid panel  BMI 45.0-49.9, adult (HCC)  We discussed benefits of wt loss as well as  adverse effects of obesity. Consistency with healthy diet and physical activity recommended. Daily brisk walking for 15-30 min as tolerated.  Hyperglycemia  Denies Hx of DM II. Prediabetes. Prevention through a healthy life style. Further recommendations will be given according to lab results.  -     Hemoglobin A1c  Allergic rhinitis, unspecified chronicity, unspecified seasonality, unspecified  trigger  Still symptomatic. Singulair 10 mg added. Nasal irrigations, Flonase, and Astelin to continue.  -     montelukast (SINGULAIR) 10 MG tablet; Take 1 tablet (10 mg total) by mouth at bedtime.  Irritable bowel syndrome with constipation  This problem can be causing lower abdominal pain. She is not interested in Ramireno. Recommend increasing fiber and fluid intake. Miralax to take daily.  Bradycardia, sinus  Bradyarrhythmia. EKG in 6/017 showed bradycardia with arrhythmia. Asymptomatic. Atenolol decreased from 50 mg to 25 mg.She is afraid that palpitations will re-occur, so recommended taking an extra 25 mg if she has elevated HR or palpitations. Instructed about warning signs.   Urinary frequency  Urine dipstick done today negative. Further recommendations will be given according to Ucx. She states that Cipro has helped in the past. I do not think she has a UTI, still Avelox prescribed today for sinusitis like symptoms also will cover for UTI common bacteria.  -     Culture, Urine -     POCT urinalysis dipstick  Sinusitis, unspecified chronicity, unspecified location  Explained that symptoms could be from allergic rhinitis. Avelox started. Steam inhalations may also help. Sinus CT will be considered if still having symptoms after treatment.   -     moxifloxacin (AVELOX) 400 MG tablet; Take 1 tablet (400 mg total) by mouth daily.   8:02 am to 8:44 am face to face. Over 50% of time was dedicated to discussion of symptoms, possible etiologies, medication side  effects, and plan of care. Diet counseling.   -[name] was advised to return sooner than planned today if new concerns arise.       [name]. [name], MD  [facility]. [place] office.

## 2016-11-27 NOTE — Patient Instructions (Addendum)
A few things to remember from today's visit:   Essential hypertension - Plan: atenolol (TENORMIN) 50 MG tablet  Gastroesophageal reflux disease, esophagitis presence not specified  BMI 45.0-49.9, adult (HCC)  Hyperglycemia  Environmental allergies  Allergic rhinitis, unspecified chronicity, unspecified seasonality, unspecified trigger - Plan: montelukast (SINGULAIR) 10 MG tablet  Irritable bowel syndrome with constipation  Bradycardia, sinus  Urinary frequency  Lower abdominal pain  Decrease atenolol to half tablet, you can take an Excedrin that he palpitations. Avelox started.  Monitor blood pressure and pulse daily.   Please be sure medication list is accurate. If a new problem present, please set up appointment sooner than planned today.

## 2016-11-28 LAB — URINE CULTURE: ORGANISM ID, BACTERIA: NO GROWTH

## 2017-01-24 NOTE — Progress Notes (Signed)
HPI:   [name] is a 66 y.o. female, who is here today to follow on recent OV.   She was seen on 11/27/16, at that time she had several concerns.  HTN: Atenolol was decreased because bradycardia. She is on HCTZ 25 mg daily and Atenolol 50 mg 1/2 tab daily. BP readings at home: "good" She is taking Atenolol 25 mg most days but takes 50 mg when she has episodes of palpitations, at least 2 times per week. Sometimes she feels some chest discomfort with palpitations, "little"dull pain, "not bad",last seconds and happen at rest. Reporting having stress test Summer 2017 and negative.  Denies severe/frequent headache, visual changes, exertional chest pain, dyspnea, claudication, focal weakness, or edema.  Allergic rhinitis: Singulair 10 mg added last OV. She is also on Flonase nasal spray and Astelin. Still having mild symptoms but improved.  Denies fever or chills.  -Left eye pruritus and erythema that has improved. When she was mowing her grass she thinks she got something in her eye, denies foreign g body sensation or visual changes. She is due for eye exam and planning on arranging appt.   New concern today: Pain on "right side." According to pt, she always have pain on this side but usually does not last long. She has had it for year,  She tells me that her PCP tried to order a CT but it was denied. Pain is above right hip, no radiated. She denies recent trauma, tells me that she mowed grass yesterday but this is not something unusual. No exacerbating or alleviating factors identified. Denies associated back pain, has prior Hx.  "Aggravating pain", she has not taking OTC analgesics. Had some nausea yesterday, took Pepto and improved.  She has been eating "a lot of peanut butler", a few days ago she noted some blood in stool,on tissue after defecation x 1, straining, Hx of IBS. She has had bowel movements since then and has not noted more blood. Denies  dyschezia. She states that she has had blood after defecation in the past. Colonoscopy 2015, 5 years follow-up was recommended. She has seen Dr Fuller Plan, GI.  Obesity: She follows with nutritionist periodically, states that because her Hx of GERD and IBS it is difficult for her to follow a specific diet. She eats peanut butter crackers frequently,according to pt,they were recommended to her with her GI issues. She is not exercising regularly.  We also reviewed prior labs, she denied Hx of DM II, which is listed on her problem list. Last HgA1C was 6.6. Marland Kitchen She states that she gets steroid shots frequently and thinks this is causing wt gain and elevated HgA1C.  Review of Systems  Constitutional: Positive for fatigue. Negative for activity change, appetite change, fever and unexpected weight change.  HENT: Positive for postnasal drip and rhinorrhea. Negative for mouth sores, nosebleeds, sore throat and trouble swallowing.   Eyes: Positive for redness and itching. Negative for visual disturbance.  Respiratory: Negative for cough, shortness of breath and wheezing.   Cardiovascular: Negative for chest pain, palpitations and leg swelling.  Gastrointestinal: Positive for anal bleeding and constipation. Negative for abdominal pain, blood in stool and vomiting.       Negative for changes in bowel habits.  Endocrine: Negative for polydipsia, polyphagia and polyuria.  Genitourinary: Negative for decreased urine volume, dysuria and hematuria.  Musculoskeletal: Positive for myalgias. Negative for back pain and gait problem.  Skin: Negative for rash.  Allergic/Immunologic: Positive for environmental allergies.  Neurological: Negative for syncope, weakness and headaches.  Psychiatric/Behavioral: Negative for confusion. The patient is nervous/anxious.       Current Outpatient Prescriptions on File Prior to Visit  Medication Sig Dispense Refill  . Aspirin-Caffeine 845-65 MG PACK Take 1-2 packets by mouth  daily as needed (headache).    Marland Kitchen atenolol (TENORMIN) 50 MG tablet Take 0.5 tablets (25 mg total) by mouth daily. 90 tablet 1  . azelastine (ASTELIN) 0.1 % nasal spray Place 2 sprays into both nostrils 2 (two) times daily. Use in each nostril as directed 30 mL 11  . calcium carbonate (TUMS EX) 750 MG chewable tablet Chew 2 tablets by mouth daily as needed for heartburn.    . cyclobenzaprine (FLEXERIL) 10 MG tablet Take 2.5 mg by mouth daily as needed (pain level 8 or greater).     Marland Kitchen dimenhyDRINATE (DRAMAMINE) 50 MG tablet Take 50 mg by mouth every 8 (eight) hours as needed for nausea. Reported on 01/12/2016    . diphenhydrAMINE (BENADRYL) 25 MG tablet Take 25 mg by mouth daily as needed.    . fluticasone (FLONASE) 50 MCG/ACT nasal spray USE TWO SPRAY (S) IN EACH NOSTRIL ONCE DAILY. 48 g 2  . hydrochlorothiazide (HYDRODIURIL) 25 MG tablet Take 1 tablet (25 mg total) by mouth daily. 90 tablet 1  . HYDROcodone-acetaminophen (NORCO/VICODIN) 5-325 MG tablet Take 0.5 tablets by mouth daily as needed (pain level 8 or greater). 30 tablet 0  . hydrocortisone 2.5 % ointment APPLY TWICE A DAY 60 g 11  . LORazepam (ATIVAN) 0.5 MG tablet Take 1 tablet (0.5 mg total) by mouth 2 (two) times daily as needed for anxiety. 30 tablet 1  . montelukast (SINGULAIR) 10 MG tablet Take 1 tablet (10 mg total) by mouth at bedtime. 30 tablet 3  . Multiple Vitamins-Minerals (MULTI-VITAMIN GUMMIES PO) Take by mouth as needed.    Marland Kitchen omeprazole (PRILOSEC) 40 MG capsule Take 40 mg by mouth daily.    . Polyethylene Glycol 3350 (MIRALAX PO) Take by mouth.    . potassium chloride SA (K-DUR,KLOR-CON) 20 MEQ tablet Take 1 tablet (20 mEq total) by mouth daily. 90 tablet 3   No current facility-administered medications on file prior to visit.      Past Medical History:  Diagnosis Date  . Allergy   . Anemia   . Anxiety   . Arthritis   . Blood in stool   . DDD (degenerative disc disease), lumbar   . GERD (gastroesophageal reflux  disease)   . Headache   . Hypertension   . Obesity    Allergies  Allergen Reactions  . Crestor [Rosuvastatin]   . Erythromycin     rash  . Lansoprazole   . Penicillins     rash  . Potassium-Containing Compounds Other (See Comments)    Stomach upset   . Pravastatin   . Robaxin [Methocarbamol]     Messed up stomach  . Sulfonamide Derivatives     rash  . Trental [Pentoxifylline Er]     Bad headache    Social History   Social History  . Marital status: Widowed    Spouse name: N/A  . Number of children: N/A  . Years of education: N/A   Social History Main Topics  . Smoking status: Former Smoker    Packs/day: 1.00    Years: 38.00    Types: Cigarettes    Quit date: 10/28/1997  . Smokeless tobacco: Never Used  . Alcohol use No  . Drug use:  No  . Sexual activity: No   Other Topics Concern  . 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    Vitals:   01/25/17 0805  BP: 128/80  Pulse: 73  Resp: 12  O2 sat at RA 94%. Body mass index is 46.34 kg/m.  Wt Readings from Last 3 Encounters:  01/25/17 253 lb 6 oz (114.9 kg)  11/27/16 253 lb 2 oz (114.8 kg)  07/26/16 250 lb 4 oz (113.5 kg)    Physical Exam  Nursing note and vitals reviewed. Constitutional: She is oriented to person, place, and time. She appears well-developed. No distress.  HENT:  Head: Atraumatic.  Mouth/Throat: Oropharynx is clear and moist and mucous membranes are normal.  Eyes: EOM are normal. Pupils are equal, round, and reactive to light. Right conjunctiva is not injected. Left conjunctiva is injected (minimal on nasal angle).  Neck: No tracheal deviation present. No thyroid mass and no thyromegaly present.  Cardiovascular: Normal rate and regular rhythm.   No murmur heard. Pulses:      Dorsalis pedis pulses are 2+ on the right side, and 2+ on the left side.  Respiratory: Effort normal and breath sounds normal. No respiratory distress.  GI: Soft. There is no  hepatomegaly. There is no tenderness. There is no CVA tenderness.    There is no tenderness upon palpation of abdomen. Tenderness right above right  iliac crest. I do not palpate hernia defect or masses.  Musculoskeletal: She exhibits edema (1+ pitting LE edema bilateral). She exhibits no tenderness.  No tenderness upon palpation of lumbar paraspinal muscles. Pain on RLQ (right above iliac crest) with certain movements on examination table and with palpation.   Lymphadenopathy:    She has no cervical adenopathy.  Neurological: She is alert and oriented to person, place, and time. She has normal strength. Gait normal.  Skin: Skin is warm. No erythema.  Psychiatric: Her mood appears anxious.  Well groomed, good eye contact.    Diabetic foot exam:  Monofilament normal bilateral. Peripheral pulses present (DP). + calluses + hypertrophic,mild.   ASSESSMENT AND PLAN:   Janeli was seen today for follow-up.  Diagnoses and all orders for this visit:  Musculoskeletal pain  Pain she is describing today seems to be muscular. Other possible causes discussed: tendinitis, radicular, and less likely intra abdominal process. Chronic. I think we can monitor for now. I recommend topical Icy hot with Lidocaine and will bring her back in 6 weeks.  Irritable bowel syndrome with constipation  + episode of rectal bleed. Benefiber and adequate hydration recommended.Avoid straining. Opioid treatment also may contribute to constipation.  Colonoscopy 2015: Diverticulosis ascending colon,polypectomy, and 5 years follow up recommended. If another episode of bleeding she needs to follow with Dr Fuller Plan.   Essential hypertension  Adequately controlled. No changes in current management for now. Recommend monitoring BP and HR because Hx of bradycardia. Today HR by my count 60/min. Palpitations seem to be chronic, reporting negative work up, Hx does not seem concerning for serious arrhythmia. ?  Anxiety. I do not have reports of cardiac work up, will try to get records. DASH-low diet recommended. Eye exam recommended annually. F/U in 4-6 months, before if needed.  Controlled type 2 diabetes mellitus without complication, without long-term current use of insulin ([place])  HgA1C at goal. Continue non pharmacologic treatment. Regular exercise and healthy diet with avoidance of added sugar food intake is an important part of treatment and recommended. Annual eye  exam, periodic dental and foot care recommended. F/U in 5-6 months  -     Microalbumin / creatinine urine ratio  Allergic rhinitis, unspecified seasonality, unspecified trigger  Improved. No changes in current management.Recommend avoiding activities that can exacerbate allergies as mowing her grass. Conjunctival concerns left eye,improving.It may be related to allergies. Instructed to arrange appt with her eye care provider.   BMI 45.0-49.9, adult (HCC)  Wt stable. We discussed benefits of wt loss as well as adverse effects of obesity. Consistency with healthy diet and physical activity recommended. Daily brisk walking for 15-30 min as tolerated.    45 min OV face to face. She is very anxious and many of concerns today are not new. > 50% of visit was dedicated to discussion of differential Dx,treatment options, stressed the importance of wt loss. We reviewed her diet and recommend reviewing labels, decrease starches, increase fluid intake among some. I will bring her back in 6 weeks.    Denni France G. [name], MD  [facility]. [place] office.

## 2017-01-25 ENCOUNTER — Ambulatory Visit (INDEPENDENT_AMBULATORY_CARE_PROVIDER_SITE_OTHER): Payer: BC Managed Care – PPO | Admitting: Family Medicine

## 2017-01-25 ENCOUNTER — Encounter: Payer: Self-pay | Admitting: Family Medicine

## 2017-01-25 VITALS — BP 128/80 | HR 73 | Resp 12 | Ht 62.0 in | Wt 253.4 lb

## 2017-01-25 DIAGNOSIS — I10 Essential (primary) hypertension: Secondary | ICD-10-CM | POA: Diagnosis not present

## 2017-01-25 DIAGNOSIS — M791 Myalgia: Secondary | ICD-10-CM

## 2017-01-25 DIAGNOSIS — K581 Irritable bowel syndrome with constipation: Secondary | ICD-10-CM | POA: Diagnosis not present

## 2017-01-25 DIAGNOSIS — J309 Allergic rhinitis, unspecified: Secondary | ICD-10-CM

## 2017-01-25 DIAGNOSIS — E119 Type 2 diabetes mellitus without complications: Secondary | ICD-10-CM

## 2017-01-25 DIAGNOSIS — Z6841 Body Mass Index (BMI) 40.0 and over, adult: Secondary | ICD-10-CM | POA: Diagnosis not present

## 2017-01-25 DIAGNOSIS — M7918 Myalgia, other site: Secondary | ICD-10-CM

## 2017-01-25 LAB — MICROALBUMIN / CREATININE URINE RATIO
CREATININE, U: 127 mg/dL
MICROALB/CREAT RATIO: 0.6 mg/g (ref 0.0–30.0)
Microalb, Ur: 0.7 mg/dL (ref 0.0–1.9)

## 2017-01-25 NOTE — Progress Notes (Signed)
Pre visit review using our clinic review tool, if applicable. No additional management support is needed unless otherwise documented below in the visit note. 

## 2017-01-25 NOTE — Patient Instructions (Signed)
A few things to remember from today's visit:   Essential hypertension  Allergic rhinitis, unspecified seasonality, unspecified trigger  Controlled type 2 diabetes mellitus without complication, without long-term current use of insulin ([place]) - Plan: Microalbumin / creatinine urine ratio  BMI 45.0-49.9, adult (HCC)  Musculoskeletal pain  Irritable bowel syndrome with constipation  Eye exam due. Benefiber daily 3 times.  Count calories.   Please be sure medication list is accurate. If a new problem present, please set up appointment sooner than planned today.

## 2017-02-19 ENCOUNTER — Ambulatory Visit: Payer: Self-pay | Admitting: Family Medicine

## 2017-02-28 NOTE — Progress Notes (Addendum)
HPI:   [name] is a 66 y.o. female, who is here today to follow on recent OV.   She was seen on 01/25/17, at that time she was c/o pain on RLQ of abdomen, above ant iliac crest; which seemed to be musculoskeletal. She applied local heat for a few days. Today she is reporting that pain has resolved.  Constipation:  Bowel movement every 1-2 days.  She has noted frequent urination, no dysuria. She states that she is drinking more water. She started Mead and has helped "a lot."  Denies abdominal pain, nausea, vomiting, blood in stool or melena.  Allergies:  She took Singulair 10 mg, took 5 mg caused upper back pain (she has Hx of back pain) and sleepiness next day.It really helped with allergies. She still takes Benadryl 12.5 mg as needed, mainly for skin pruritus,which she attributes to Hydrocodone (takes it for chronic back pain). She has not noted rash.  She is on Flonase nasal spray and Astelin. She denies sinus pain, fever,or chill.  Obesity:  She is walking in place while watching TV She is now drinking sodas 2 times per week, decreased from daily. She drinks Gatorade 10-12 oz daily, she states that a few years ago this was recommended.  HypoK+:  Lab Results  Component Value Date   CREATININE 0.95 11/27/2016   BUN 14 11/27/2016   NA 142 11/27/2016   K 3.8 11/27/2016   CL 104 11/27/2016   CO2 28 11/27/2016   She has not taken her KCL for a month, she was on 20 meq 1/2 tab. She is on HCTZ 12.5 mg daily. She takes Flexeril 1/4 tab at night as needed for back pain, also on Hydrocodone-Acetaminophen 5-325 mg 1/5 tab tid as needed. She follows with ortho.   Review of Systems  Constitutional: Negative for activity change, appetite change, fatigue, fever and unexpected weight change.  HENT: Positive for congestion, postnasal drip and rhinorrhea. Negative for mouth sores, nosebleeds, sinus pain, sore throat and trouble swallowing.   Eyes:  Positive for itching. Negative for redness and visual disturbance.  Respiratory: Negative for cough, shortness of breath and wheezing.   Cardiovascular: Negative for chest pain, palpitations and leg swelling.  Gastrointestinal: Negative for abdominal pain, blood in stool, nausea and vomiting.  Endocrine: Negative for polydipsia and polyphagia.  Genitourinary: Positive for frequency. Negative for decreased urine volume, difficulty urinating, dysuria and hematuria.  Musculoskeletal: Positive for arthralgias, back pain and neck pain. Negative for gait problem.  Skin: Negative for color change and rash.  Allergic/Immunologic: Positive for environmental allergies.  Neurological: Negative for syncope, weakness and headaches.  Psychiatric/Behavioral: Negative for confusion. The patient is nervous/anxious.       Current Outpatient Prescriptions on File Prior to Visit  Medication Sig Dispense Refill  . Aspirin-Caffeine 845-65 MG PACK Take 1-2 packets by mouth daily as needed (headache).    Marland Kitchen atenolol (TENORMIN) 50 MG tablet Take 0.5 tablets (25 mg total) by mouth daily. 90 tablet 1  . azelastine (ASTELIN) 0.1 % nasal spray Place 2 sprays into both nostrils 2 (two) times daily. Use in each nostril as directed 30 mL 11  . calcium carbonate (TUMS EX) 750 MG chewable tablet Chew 2 tablets by mouth daily as needed for heartburn.    . cyclobenzaprine (FLEXERIL) 10 MG tablet Take 2.5 mg by mouth daily as needed (pain level 8 or greater).     Marland Kitchen dimenhyDRINATE (DRAMAMINE) 50 MG tablet Take 50 mg  by mouth every 8 (eight) hours as needed for nausea. Reported on 01/12/2016    . diphenhydrAMINE (BENADRYL) 25 MG tablet Take 25 mg by mouth daily as needed.    . fluticasone (FLONASE) 50 MCG/ACT nasal spray USE TWO SPRAY (S) IN EACH NOSTRIL ONCE DAILY. 48 g 2  . hydrochlorothiazide (HYDRODIURIL) 25 MG tablet Take 1 tablet (25 mg total) by mouth daily. (Patient taking differently: Take 12.5 mg by mouth daily. ) 90  tablet 1  . HYDROcodone-acetaminophen (NORCO/VICODIN) 5-325 MG tablet Take 0.5 tablets by mouth daily as needed (pain level 8 or greater). 30 tablet 0  . hydrocortisone 2.5 % ointment APPLY TWICE A DAY 60 g 11  . LORazepam (ATIVAN) 0.5 MG tablet Take 1 tablet (0.5 mg total) by mouth 2 (two) times daily as needed for anxiety. 30 tablet 1  . Multiple Vitamins-Minerals (MULTI-VITAMIN GUMMIES PO) Take by mouth as needed.    Marland Kitchen omeprazole (PRILOSEC) 40 MG capsule Take 40 mg by mouth daily.    . Polyethylene Glycol 3350 (MIRALAX PO) Take by mouth.    . potassium chloride SA (K-DUR,KLOR-CON) 20 MEQ tablet Take 1 tablet (20 mEq total) by mouth daily. 90 tablet 3   No current facility-administered medications on file prior to visit.      Past Medical History:  Diagnosis Date  . Allergy   . Anemia   . Anxiety   . Arthritis   . Blood in stool   . DDD (degenerative disc disease), lumbar   . GERD (gastroesophageal reflux disease)   . Headache   . Hypertension   . Obesity    Allergies  Allergen Reactions  . Crestor [Rosuvastatin]   . Erythromycin     rash  . Lansoprazole   . Penicillins     rash  . Potassium-Containing Compounds Other (See Comments)    Stomach upset   . Pravastatin   . Robaxin [Methocarbamol]     Messed up stomach  . Sulfonamide Derivatives     rash  . Trental [Pentoxifylline Er]     Bad headache    Social History   Social History  . Marital status: Widowed    Spouse name: N/A  . Number of children: N/A  . Years of education: N/A   Social History Main Topics  . Smoking status: Former Smoker    Packs/day: 1.00    Years: 38.00    Types: Cigarettes    Quit date: 10/28/1997  . Smokeless tobacco: Never Used  . Alcohol use No  . Drug use: No  . Sexual activity: No   Other Topics Concern  . 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    Vitals:   03/01/17 0750  BP: 118/78  Pulse: 70  Resp: 12  O2 sat at RA  94%.  Body mass index is 46.09 kg/m.  Wt Readings from Last 3 Encounters:  03/01/17 252 lb (114.3 kg)  01/25/17 253 lb 6 oz (114.9 kg)  11/27/16 253 lb 2 oz (114.8 kg)     Physical Exam  Nursing note and vitals reviewed. Constitutional: She is oriented to person, place, and time. She appears well-developed. No distress.  HENT:  Head: Atraumatic.  Mouth/Throat: Oropharynx is clear and moist and mucous membranes are normal.  Eyes: Conjunctivae and EOM are normal. Pupils are equal, round, and reactive to light.  Cardiovascular: Normal rate and regular rhythm.   No murmur heard. Pulses:      Dorsalis  pedis pulses are 2+ on the right side, and 2+ on the left side.  Respiratory: Effort normal and breath sounds normal. No respiratory distress.  GI: Soft. She exhibits no mass. There is no hepatomegaly. There is no tenderness. There is no CVA tenderness.  Musculoskeletal: She exhibits edema (trace pitting edema LE bilateral).       Lumbar back: She exhibits tenderness. She exhibits no bony tenderness.  Trapezium muscle spasm bilateral and tenderness upon palpation.   Lymphadenopathy:    She has no cervical adenopathy.  Neurological: She is alert and oriented to person, place, and time. She has normal strength. Gait normal.  Skin: Skin is warm. No rash noted. No erythema.  Psychiatric: Her mood appears anxious.  Well groomed, good eye contact.      ASSESSMENT AND PLAN:   [name] was seen today for follow-up.  Diagnoses and all orders for this visit:  Lab Results  Component Value Date   CREATININE 1.06 03/01/2017   BUN 21 03/01/2017   NA 141 03/01/2017   K 3.9 03/01/2017   CL 103 03/01/2017   CO2 29 03/01/2017    Class 3 obesity with serious comorbidity and body mass index (BMI) of 45.0 to 49.9 in adult, unspecified obesity type ([place])  We discussed benefits of wt loss as well as adverse effects of obesity. Consistency with healthy diet and physical activity  recommended.Recommend stopping Gatorade intake, instead drink water. Wt has been otherwise stable.  Hypokalemia  Further recommendations will be given according to lab results. For now K+ rich diet.  -     Basic metabolic panel  Allergic rhinitis, unspecified seasonality, unspecified trigger  Singulair dose decreased to 4 mg since she did not tolerate 5 mg. No changes in Flonase and Astelin. Nasal saline as needed.  -     montelukast (SINGULAIR) 4 MG chewable tablet; Chew 1 tablet (4 mg total) by mouth at bedtime.  Irritable bowel syndrome with constipation  Improved. Continue adequate fiber and fluid intake. We discussed some side effects of opioid medications, including constipation.  Upper back pain  This is chronic, she can increase dose of Flexeril from 1/4 tab to 1/2 tab at bedtime. Some side effects discussed. Local heat.  Continue following with ortho.    I will see her back in 2 months for her routine preventive visit.     Madyn Ivins G. [name], MD  [facility]. [place] office.

## 2017-03-01 ENCOUNTER — Encounter: Payer: Self-pay | Admitting: Family Medicine

## 2017-03-01 ENCOUNTER — Ambulatory Visit (INDEPENDENT_AMBULATORY_CARE_PROVIDER_SITE_OTHER): Payer: BC Managed Care – PPO | Admitting: Family Medicine

## 2017-03-01 VITALS — BP 118/78 | HR 70 | Resp 12 | Ht 62.0 in | Wt 252.0 lb

## 2017-03-01 DIAGNOSIS — K581 Irritable bowel syndrome with constipation: Secondary | ICD-10-CM

## 2017-03-01 DIAGNOSIS — J309 Allergic rhinitis, unspecified: Secondary | ICD-10-CM

## 2017-03-01 DIAGNOSIS — E669 Obesity, unspecified: Secondary | ICD-10-CM

## 2017-03-01 DIAGNOSIS — Z6841 Body Mass Index (BMI) 40.0 and over, adult: Secondary | ICD-10-CM | POA: Diagnosis not present

## 2017-03-01 DIAGNOSIS — M549 Dorsalgia, unspecified: Secondary | ICD-10-CM

## 2017-03-01 DIAGNOSIS — IMO0001 Reserved for inherently not codable concepts without codable children: Secondary | ICD-10-CM

## 2017-03-01 DIAGNOSIS — E876 Hypokalemia: Secondary | ICD-10-CM

## 2017-03-01 LAB — BASIC METABOLIC PANEL
BUN: 21 mg/dL (ref 6–23)
CHLORIDE: 103 meq/L (ref 96–112)
CO2: 29 mEq/L (ref 19–32)
Calcium: 10.2 mg/dL (ref 8.4–10.5)
Creatinine, Ser: 1.06 mg/dL (ref 0.40–1.20)
GFR: 67.2 mL/min (ref >60.00)
Glucose, Bld: 108 mg/dL — ABNORMAL HIGH (ref 70–99)
POTASSIUM: 3.9 meq/L (ref 3.5–5.1)
Sodium: 141 mEq/L (ref 135–145)

## 2017-03-01 MED ORDER — MONTELUKAST SODIUM 4 MG PO CHEW: 4.0000 mg | CHEWABLE_TABLET | Freq: Every day | ORAL | 3 refills | Status: DC

## 2017-03-01 NOTE — Patient Instructions (Signed)
A few things to remember from today's visit:   BMI 45.0-49.9, adult ([place])  Hypokalemia - Plan: Basic metabolic panel  Allergic rhinitis, unspecified seasonality, unspecified trigger - Plan: montelukast (SINGULAIR) 4 MG chewable tablet  Irritable bowel syndrome with constipation  Upper back pain  Massage on upper back, trapeziums. Stretching exercises. Continue Flexeril.  Stop Gatorade and will resume potassium according to labs.   Please be sure medication list is accurate. If a new problem present, please set up appointment sooner than planned today.

## 2017-03-07 DIAGNOSIS — IMO0001 Reserved for inherently not codable concepts without codable children: Secondary | ICD-10-CM | POA: Insufficient documentation

## 2017-03-07 HISTORY — DX: Reserved for inherently not codable concepts without codable children: IMO0001

## 2017-03-13 ENCOUNTER — Telehealth: Payer: Self-pay | Admitting: Family Medicine

## 2017-03-13 ENCOUNTER — Other Ambulatory Visit: Payer: Self-pay | Admitting: Family Medicine

## 2017-03-13 DIAGNOSIS — I10 Essential (primary) hypertension: Secondary | ICD-10-CM

## 2017-03-13 MED ORDER — HYDROCHLOROTHIAZIDE 25 MG PO TABS: 25.0000 mg | ORAL_TABLET | Freq: Every day | ORAL | 1 refills | Status: DC

## 2017-03-13 NOTE — Telephone Encounter (Signed)
Rx sent 

## 2017-03-13 NOTE — Telephone Encounter (Signed)
° ° °  Pt request refill of the following:  hydrochlorothiazide (HYDRODIURIL) 25 MG tablet   Phamacy:  [facility]

## 2017-03-19 ENCOUNTER — Other Ambulatory Visit: Payer: Self-pay | Admitting: Family Medicine

## 2017-03-19 DIAGNOSIS — I10 Essential (primary) hypertension: Secondary | ICD-10-CM

## 2017-03-19 NOTE — Telephone Encounter (Signed)
Received a fax from the pharmacy.  Pt states she is taking more than 1/2 tab daily.  Please send in new rx if needed.

## 2017-03-20 MED ORDER — ATENOLOL 50 MG PO TABS: ORAL_TABLET | ORAL | 1 refills | Status: DC

## 2017-03-21 ENCOUNTER — Other Ambulatory Visit: Payer: Self-pay | Admitting: Emergency Medicine

## 2017-03-21 NOTE — Telephone Encounter (Signed)
Patient has established with Dr. Betty [name]; will forward refill request.

## 2017-04-26 DIAGNOSIS — E785 Hyperlipidemia, unspecified: Secondary | ICD-10-CM

## 2017-04-26 DIAGNOSIS — E1169 Type 2 diabetes mellitus with other specified complication: Secondary | ICD-10-CM | POA: Insufficient documentation

## 2017-04-26 NOTE — Progress Notes (Signed)
HPI:   [name] is a 66 y.o. female, who is here today for her routine physical.  She was last seen on 03/01/17.   Regular exercise 3 or more time per week: Walking 3 times per week. Following a healthy diet: Yes  She lives with mother, she is her caregiver.   Chronic medical problems: Obesity,GERD,HLD,HTN, chronic pain, allergic rhinitis, and DM 2 among some.  Lab Results  Component Value Date   CHOL 195 11/27/2016   HDL 47.40 11/27/2016   LDLCALC 128 (H) 11/27/2016   TRIG 100.0 11/27/2016   CHOLHDL 4 11/27/2016   Lab Results  Component Value Date   HGBA1C 6.6 (H) 11/27/2016   DM II, she is non pharmacologic treatment.  Provider she sees regularly: Dr [name], ortho, she has had "steroid shots" since her last OV to treat upper back pain, it helped. She will see Dr [name], [name], she has an appt 05/2017. Last eye exam about 10-20 years ago.    Pap smear: A few years ago and she tells me that she was told she did not need another one. S/P hysterectomy due to fibroids.  Hx of abnormal pap smears: Denies Hx of STD's: Denies. She is not sexually active.  Mammogram: [facility] 2 07/2016. Colonoscopy: 2015, recommend 7 years follow. DEXA: Per pt report, around 66 yo, abnormal, recommended stopping PPI.   Hep C screening: 02/17/16  Functional Status Survey: Is the patient deaf or have difficulty hearing?: No Does the patient have difficulty seeing, even when wearing glasses/contacts?: No Does the patient have difficulty concentrating, remembering, or making decisions?: No Does the patient have difficulty walking or climbing stairs?: Yes Does the patient have difficulty dressing or bathing?: No Does the patient have difficulty doing errands alone such as visiting a doctor's office or shopping?: No  Fall Risk  04/27/2017 03/14/2016 02/17/2016 03/23/2015  Falls in the past year? No No Yes Yes  Number falls in past yr: - - 2 or more -    Visual  Acuity Screening   Right eye Left eye Both eyes  Without correction: 20/40 20/40 20/25   With correction:      Depression screen PHQ 2/9 04/27/2017  Decreased Interest 0  Down, Depressed, Hopeless 0  PHQ - 2 Score 0    Review of Systems  Constitutional: Positive for fatigue (no more than usual). Negative for appetite change, fever and unexpected weight change.  HENT: Negative for hearing loss, mouth sores, trouble swallowing and voice change.   Eyes: Negative for redness and visual disturbance.  Respiratory: Negative for cough, shortness of breath and wheezing.   Cardiovascular: Negative for chest pain, palpitations and leg swelling.  Gastrointestinal: Negative for abdominal pain, nausea and vomiting.       No changes in bowel habits.  Endocrine: Negative for cold intolerance, heat intolerance, polydipsia, polyphagia and polyuria.  Genitourinary: Negative for decreased urine volume, dysuria, hematuria, vaginal bleeding and vaginal discharge.  Musculoskeletal: Positive for arthralgias, back pain and neck pain. Negative for gait problem.  Skin: Negative for color change and rash.  Allergic/Immunologic: Positive for environmental allergies.  Neurological: Negative for syncope, weakness and headaches.  Hematological: Negative for adenopathy. Does not bruise/bleed easily.  Psychiatric/Behavioral: Negative for confusion and sleep disturbance. The patient is nervous/anxious.   All other systems reviewed and are negative.     Current Outpatient Prescriptions on File Prior to Visit  Medication Sig Dispense Refill  . Aspirin-Caffeine 845-65 MG PACK Take 1-2 packets by  mouth daily as needed (headache).    Marland Kitchen atenolol (TENORMIN) 50 MG tablet Take 1/2 tab daily and an extra 1/2 tab if needed for elevated heart rate. 80 tablet 1  . azelastine (ASTELIN) 0.1 % nasal spray Place 2 sprays into both nostrils 2 (two) times daily. Use in each nostril as directed 30 mL 11  . calcium carbonate (TUMS EX)  750 MG chewable tablet Chew 2 tablets by mouth daily as needed for heartburn.    . cyclobenzaprine (FLEXERIL) 10 MG tablet Take 2.5 mg by mouth daily as needed (pain level 8 or greater).     Marland Kitchen dimenhyDRINATE (DRAMAMINE) 50 MG tablet Take 50 mg by mouth every 8 (eight) hours as needed for nausea. Reported on 01/12/2016    . diphenhydrAMINE (BENADRYL) 25 MG tablet Take 25 mg by mouth daily as needed.    . fluticasone (FLONASE) 50 MCG/ACT nasal spray USE TWO SPRAY (S) IN EACH NOSTRIL ONCE DAILY. 48 g 2  . hydrochlorothiazide (HYDRODIURIL) 25 MG tablet TAKE ONE TABLET BY MOUTH ONCE DAILY 90 tablet 1  . HYDROcodone-acetaminophen (NORCO/VICODIN) 5-325 MG tablet Take 0.5 tablets by mouth daily as needed (pain level 8 or greater). 30 tablet 0  . hydrocortisone 2.5 % ointment APPLY TWICE A DAY 60 g 11  . LORazepam (ATIVAN) 0.5 MG tablet Take 1 tablet (0.5 mg total) by mouth 2 (two) times daily as needed for anxiety. 30 tablet 1  . montelukast (SINGULAIR) 4 MG chewable tablet Chew 1 tablet (4 mg total) by mouth at bedtime. 30 tablet 3  . Multiple Vitamins-Minerals (MULTI-VITAMIN GUMMIES PO) Take by mouth as needed.    Marland Kitchen omeprazole (PRILOSEC) 40 MG capsule Take 40 mg by mouth daily.    . Polyethylene Glycol 3350 (MIRALAX PO) Take by mouth.     No current facility-administered medications on file prior to visit.      Past Medical History:  Diagnosis Date  . Allergy   . Anemia   . Anxiety   . Arthritis   . Blood in stool   . DDD (degenerative disc disease), lumbar   . GERD (gastroesophageal reflux disease)   . Headache   . Hypertension   . Obesity    Past Surgical History:  Procedure Laterality Date  . ABDOMINAL HYSTERECTOMY    . COLONOSCOPY  2014  . RADIOLOGY WITH ANESTHESIA N/A 02/25/2015   Procedure: MRI OF LUMBAR SPINE;  Surgeon: Medication Radiologist, MD;  Location: [place];  Service: Radiology;  Laterality: N/A;  DR. MAX Herring/MRI  . SPINE SURGERY    . TONSILLECTOMY AND ADENOIDECTOMY       Allergies  Allergen Reactions  . Crestor [Rosuvastatin]   . Erythromycin     rash  . Lansoprazole   . Penicillins     rash  . Potassium-Containing Compounds Other (See Comments)    Stomach upset   . Pravastatin   . Robaxin [Methocarbamol]     Messed up stomach  . Sulfonamide Derivatives     rash  . Trental [Pentoxifylline Er]     Bad headache    Family History  Problem Relation Age of Onset  . Hypertension Mother   . Hyperlipidemia Mother   . Hyperlipidemia Father   . Hypertension Father   . Arthritis Father   . Hyperlipidemia Sister   . Cervical cancer Sister   . Diabetes Sister   . Heart disease Sister   . Cancer Maternal Grandfather   . Hypertension Paternal Grandmother   . Pancreatic cancer  Maternal Aunt   . Breast cancer Paternal Aunt   . Breast cancer Cousin     Social History   Social History  . Marital status: Widowed    Spouse name: N/A  . Number of children: N/A  . Years of education: N/A   Social History Main Topics  . Smoking status: Former Smoker    Packs/day: 1.00    Years: 38.00    Types: Cigarettes    Quit date: 10/28/1997  . Smokeless tobacco: Never Used  . Alcohol use No  . Drug use: No  . Sexual activity: No   Other Topics Concern  . 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     Vitals:   04/27/17 0758  BP: 128/80  Pulse: 76  Resp: 12   Body mass index is 45.77 kg/m. O2 sat at RA 94%  Wt Readings from Last 3 Encounters:  04/27/17 250 lb 4 oz (113.5 kg)  03/01/17 252 lb (114.3 kg)  01/25/17 253 lb 6 oz (114.9 kg)    Physical Exam  Nursing note and vitals reviewed. Constitutional: She is oriented to person, place, and time. She appears well-developed. No distress.  HENT:  Head: Atraumatic.  Right Ear: Hearing, tympanic membrane, external ear and ear canal normal.  Left Ear: Hearing, tympanic membrane, external ear and ear canal normal.  Mouth/Throat: Uvula is midline,  oropharynx is clear and moist and mucous membranes are normal.  Eyes: Pupils are equal, round, and reactive to light. Conjunctivae and EOM are normal.  Neck: No tracheal deviation present. No thyromegaly present.  Cardiovascular: Normal rate and regular rhythm.   No murmur heard. Pulses:      Dorsalis pedis pulses are 2+ on the right side, and 2+ on the left side.  Respiratory: Effort normal and breath sounds normal. No respiratory distress.  GI: Soft. She exhibits no mass. There is no hepatomegaly. There is no tenderness.  Genitourinary: No breast swelling or tenderness.  Genitourinary Comments: Breast: No masses,skin change,or nipple discharge bilateral.  Musculoskeletal: She exhibits edema (1+ pitting LE edema ,bilateral.).  No major deformity or signs of synovitis appreciated.  Lymphadenopathy:    She has no cervical adenopathy.    She has no axillary adenopathy.       Right: No supraclavicular adenopathy present.       Left: No supraclavicular adenopathy present.  Neurological: She is alert and oriented to person, place, and time. She has normal strength. No cranial nerve deficit. Coordination and gait normal.  Reflex Scores:      Bicep reflexes are 2+ on the right side and 2+ on the left side.      Patellar reflexes are 2+ on the right side and 2+ on the left side. Skin: Skin is warm. No rash noted. No erythema.  Psychiatric: Her mood appears anxious. Cognition and memory are normal.  Well groomed, good eye contact.    ASSESSMENT AND PLAN:    Ms Jyasia was seen today for annual exam.  Diagnoses and all orders for this visit: Lab Results  Component Value Date   HGBA1C 6.6 (H) 04/27/2017   Lab Results  Component Value Date   CREATININE 0.99 04/27/2017   BUN 22 04/27/2017   NA 141 04/27/2017   K 3.9 04/27/2017   CL 102 04/27/2017   CO2 29 04/27/2017   Lab Results  Component Value Date   CHOL 202 (H) 04/27/2017   HDL 51.50 04/27/2017   [place]  125 (H) 04/27/2017    TRIG 126.0 04/27/2017   CHOLHDL 4 04/27/2017    Routine general medical examination at a health care facility  We discussed the importance of staying active, physically and mentally, as well as the benefits of a healthy/balnace diet. Low impact exercise that involve stretching and strengthing are ideal. Vaccines updated. We discussed preventive screening for the next 5-10 years, summery of recommendations given in AVS: Colonoscopy 2022, DEXA at 64, mammogram q 1-2 years, eye exam/glaucoma screening annually. Ca++ 1200 mg and vit D [phone] U recommended. Fall prevention.  Advance directives and end of life discussed,she does not have POA or living will. Information and forms given.    Controlled type 2 diabetes mellitus without complication, without long-term current use of insulin ([place])  HgA1C pending today. Continue non pharmacologic treatment. Regular exercise and healthy diet with avoidance of added sugar food intake is an important part of treatment and recommended. Annual eye exam, periodic dental and foot care recommended. F/U in 5-6 months  -     Hemoglobin A1c -     Pneumococcal polysaccharide vaccine 23-valent greater than or equal to 2yo subcutaneous/IM  Hyperlipidemia associated with type 2 diabetes mellitus ([place])  She has not tolerated statins before, will consider Livalo. Further recommendations will be given according to lab results.    -     Basic metabolic panel -     Lipid panel    Return in 4 months (on 08/28/2017) for DM,Wt,HTN.      [name]. [name], MD  [facility]. [place] office.

## 2017-04-27 ENCOUNTER — Ambulatory Visit (INDEPENDENT_AMBULATORY_CARE_PROVIDER_SITE_OTHER): Payer: BC Managed Care – PPO | Admitting: Family Medicine

## 2017-04-27 ENCOUNTER — Encounter: Payer: Self-pay | Admitting: Family Medicine

## 2017-04-27 VITALS — BP 128/80 | HR 76 | Resp 12 | Ht 62.0 in | Wt 250.2 lb

## 2017-04-27 DIAGNOSIS — E785 Hyperlipidemia, unspecified: Secondary | ICD-10-CM

## 2017-04-27 DIAGNOSIS — E119 Type 2 diabetes mellitus without complications: Secondary | ICD-10-CM

## 2017-04-27 DIAGNOSIS — Z0000 Encounter for general adult medical examination without abnormal findings: Secondary | ICD-10-CM | POA: Diagnosis not present

## 2017-04-27 DIAGNOSIS — Z23 Encounter for immunization: Secondary | ICD-10-CM

## 2017-04-27 DIAGNOSIS — E1169 Type 2 diabetes mellitus with other specified complication: Secondary | ICD-10-CM | POA: Diagnosis not present

## 2017-04-27 LAB — BASIC METABOLIC PANEL
BUN: 22 mg/dL (ref 6–23)
CALCIUM: 10.4 mg/dL (ref 8.4–10.5)
CO2: 29 meq/L (ref 19–32)
Chloride: 102 mEq/L (ref 96–112)
Creatinine, Ser: 0.99 mg/dL (ref 0.40–1.20)
GFR: 72.68 mL/min (ref >60.00)
Glucose, Bld: 105 mg/dL — ABNORMAL HIGH (ref 70–99)
Potassium: 3.9 mEq/L (ref 3.5–5.1)
SODIUM: 141 meq/L (ref 135–145)

## 2017-04-27 LAB — HEMOGLOBIN A1C: Hgb A1c MFr Bld: 6.6 % — ABNORMAL HIGH (ref 4.6–6.5)

## 2017-04-27 LAB — LIPID PANEL
CHOL/HDL RATIO: 4
Cholesterol: 202 mg/dL — ABNORMAL HIGH (ref 0–200)
HDL: 51.5 mg/dL (ref >39.00)
LDL Cholesterol: 125 mg/dL — ABNORMAL HIGH (ref 0–99)
NonHDL: 150.16
TRIGLYCERIDES: 126 mg/dL (ref 0.0–149.0)
VLDL: 25.2 mg/dL (ref 0.0–40.0)

## 2017-04-27 NOTE — Patient Instructions (Addendum)
A few things to remember from today's visit:   Controlled type 2 diabetes mellitus without complication, without long-term current use of insulin ([place]) - Plan: Hemoglobin A1c  Hyperlipidemia associated with type 2 diabetes mellitus ([place]) - Plan: Basic metabolic panel, Lipid panel  Routine general medical examination at a health care facility    At least 150 minutes of moderate exercise per week, daily brisk walking for 15-30 min is a good exercise option. Healthy diet low in saturated (animal) fats and sweets and consisting of fresh fruits and vegetables, lean meats such as fish and white chicken and whole grains.  In the next 5-10 years  - Vaccines:  Tdap vaccine every 10 years.  Shingles vaccine recommended at age 57, could be given after 64 years of age but not sure about insurance coverage.  Pneumonia vaccines:  Prevnar 13 at 35 and Pneumovax today because diabetes.  Screening recommendations for low/normal risk women:  Screening for diabetes at age 33-45 and every 3 years.  Cervical cancer prevention:  S/P hysterectomy.  -Breast cancer: Mammogram: There is disagreement between experts about when to start screening in low risk asymptomatic female but recent recommendations are to start screening at 51 and not later than 64 years old , every 1-2 years and after 64 yo q 2 years. Screening is recommended until 64 years old but some women can continue screening depending of healthy issues.   Colon cancer screening: starts at 64 years old until 64 years old.    Also recommended:  1. Dental visit- Brush and floss your teeth twice daily; visit your dentist twice a year. 2. Eye doctor- Get an eye exam at least every 2 years. 3. Helmet use- Always wear a helmet when riding a bicycle, motorcycle, rollerblading or skateboarding. 4. Safe sex- If you may be exposed to sexually transmitted infections, use a condom. 5. Seat belts- Seat belts can save your live; always wear  one. 6. Smoke/Carbon Monoxide detectors- These detectors need to be installed on the appropriate level of your home. Replace batteries at least once a year. 7. Skin cancer- When out in the sun please cover up and use sunscreen 15 SPF or higher. 8. Violence- If anyone is threatening or hurting you, please tell your healthcare provider.  9. Drink alcohol in moderation- Limit alcohol intake to one drink or less per day. Never drink and drive.   Advance directives:  Please see a lawyer and/or go to this website to help you with advanced directives and designating a health care power of attorney so that your wishes will be followed should you become too ill to make your own medical decisions.  [email or web address]  Please be sure medication list is accurate. If a new problem present, please set up appointment sooner than planned today.

## 2017-04-30 ENCOUNTER — Other Ambulatory Visit: Payer: Self-pay

## 2017-04-30 MED ORDER — PITAVASTATIN CALCIUM 2 MG PO TABS: 0.5000 | ORAL_TABLET | Freq: Every day | ORAL | 3 refills | Status: DC

## 2017-05-09 ENCOUNTER — Other Ambulatory Visit: Payer: Self-pay

## 2017-05-09 DIAGNOSIS — I10 Essential (primary) hypertension: Secondary | ICD-10-CM

## 2017-05-09 MED ORDER — ATENOLOL 50 MG PO TABS: 50.0000 mg | ORAL_TABLET | Freq: Every day | ORAL | 1 refills | Status: DC

## 2017-05-11 ENCOUNTER — Telehealth: Payer: Self-pay | Admitting: Family Medicine

## 2017-05-11 MED ORDER — PITAVASTATIN CALCIUM 2 MG PO TABS: 0.5000 | ORAL_TABLET | Freq: Every day | ORAL | 3 refills | Status: DC

## 2017-05-11 NOTE — Telephone Encounter (Signed)
Rx re-sent into the [facility] on [address].

## 2017-05-11 NOTE — Telephone Encounter (Signed)
Pt still waiting to get the new cholesterol med called in to   [facility] ([facility]), [place] [facility] [place]

## 2017-05-14 ENCOUNTER — Telehealth: Payer: Self-pay

## 2017-05-14 NOTE — Telephone Encounter (Signed)
Received PA request for [place]. PA submitted & pending. Key: [id number]

## 2017-05-15 NOTE — Telephone Encounter (Signed)
PA denied. Appeal faxed to Insurance today.

## 2017-05-21 ENCOUNTER — Telehealth: Payer: Self-pay | Admitting: Family Medicine

## 2017-05-21 NOTE — Telephone Encounter (Signed)
Left voicemail for patient letting her know that we haven't received a reply back from insurance at this time.

## 2017-05-21 NOTE — Telephone Encounter (Signed)
Pt is calling checking on appeal status for chole med. Please call pt back

## 2017-05-29 NOTE — Telephone Encounter (Signed)
PA approved, form faxed back to pharmacy. 

## 2017-05-31 LAB — HM DIABETES EYE EXAM

## 2017-06-06 MED ORDER — PRAVASTATIN SODIUM 20 MG PO TABS: 20.0000 mg | ORAL_TABLET | Freq: Every day | ORAL | 3 refills | Status: DC

## 2017-06-06 NOTE — Telephone Encounter (Signed)
Okay to switch to Pravastatin? Patient would like to take it 3 times a week.

## 2017-06-06 NOTE — Telephone Encounter (Signed)
Pravastatin if fine. She can start with 20 mg daily and if side effects she can try every other days. Thanks, BJ

## 2017-06-06 NOTE — Telephone Encounter (Signed)
I called and spoke with patient. She is aware the Rx for Pravastatin has been sent into the [facility]. She will try medication daily, and move to every other day if she is having any side effects. Nothing further needed at this time.

## 2017-06-06 NOTE — Addendum Note (Signed)
Addended by: Kateri Mc E on: 06/06/2017 02:33 PM   Modules accepted: Orders

## 2017-06-06 NOTE — Telephone Encounter (Signed)
Pt would like to know if Dr [name] will prescribe her  Pravastatin?  3 X a week.  Pt knows her insurance will approve this med.  Instead of waiting on approval.  [facility] ([facility]), [place] - [place]

## 2017-07-06 ENCOUNTER — Ambulatory Visit (INDEPENDENT_AMBULATORY_CARE_PROVIDER_SITE_OTHER): Payer: BC Managed Care – PPO | Admitting: Family Medicine

## 2017-07-06 ENCOUNTER — Encounter: Payer: Self-pay | Admitting: Family Medicine

## 2017-07-06 VITALS — BP 110/70 | HR 68 | Resp 12 | Ht 62.0 in | Wt 250.4 lb

## 2017-07-06 DIAGNOSIS — R1030 Lower abdominal pain, unspecified: Secondary | ICD-10-CM | POA: Diagnosis not present

## 2017-07-06 DIAGNOSIS — N390 Urinary tract infection, site not specified: Secondary | ICD-10-CM

## 2017-07-06 DIAGNOSIS — Z23 Encounter for immunization: Secondary | ICD-10-CM | POA: Diagnosis not present

## 2017-07-06 DIAGNOSIS — F419 Anxiety disorder, unspecified: Secondary | ICD-10-CM | POA: Diagnosis not present

## 2017-07-06 DIAGNOSIS — R399 Unspecified symptoms and signs involving the genitourinary system: Secondary | ICD-10-CM | POA: Diagnosis not present

## 2017-07-06 HISTORY — DX: Anxiety disorder, unspecified: F41.9

## 2017-07-06 LAB — POCT URINALYSIS DIPSTICK
BILIRUBIN UA: NEGATIVE
Glucose, UA: NEGATIVE
Ketones, UA: NEGATIVE
Leukocytes, UA: NEGATIVE
NITRITE UA: NEGATIVE
PH UA: 5.5 (ref 5.0–8.0)
Protein, UA: NEGATIVE
RBC UA: NEGATIVE
Spec Grav, UA: 1.025 (ref 1.010–1.025)
UROBILINOGEN UA: 0.2 U/dL

## 2017-07-06 MED ORDER — LORAZEPAM 0.5 MG PO TABS: 0.5000 mg | ORAL_TABLET | Freq: Every day | ORAL | 1 refills | Status: DC | PRN

## 2017-07-06 MED ORDER — CIPROFLOXACIN HCL 250 MG PO TABS: 250.0000 mg | ORAL_TABLET | Freq: Two times a day (BID) | ORAL | 0 refills | Status: DC

## 2017-07-06 MED ORDER — CIPROFLOXACIN HCL 250 MG PO TABS: 250.0000 mg | ORAL_TABLET | Freq: Two times a day (BID) | ORAL | 0 refills | Status: AC

## 2017-07-06 NOTE — Patient Instructions (Addendum)
A few things to remember from today's visit:   UTI symptoms - Plan: POCT urinalysis dipstick, ciprofloxacin (CIPRO) 250 MG tablet, Culture, Urine  Urinary tract infection without hematuria, site unspecified  Class 3 obesity with serious comorbidity and body mass index (BMI) of 45.0 to 49.9 in adult, unspecified obesity type  Anxiety disorder, unspecified type - Plan: LORazepam (ATIVAN) 0.5 MG tablet    Adequate fluid intake, avoid holding urine for long hours, and over the counter Vit C OR cranberry capsules might help. Take cranberry capsule instead juice, because calories.   Today we will treat empirically with antibiotic, which we might need to change when urine culture comes back depending of bacteria susceptibility.  Seek immediate medical attention if severe abdominal pain, vomiting, fever/chills, or worsening symptoms. F/U if symptomatic are not any better after 2-3 days of antibiotic treatment.   Please keep next appointment. I hope you feel better soon!    Please be sure medication list is accurate. If a new problem present, please set up appointment sooner than planned today.

## 2017-07-06 NOTE — Progress Notes (Signed)
HPI:   [name] is a 66 y.o. female, who is here today complaining of at least 2 week of urinary frequency and occasional burning sensation with urination. She denies urinary frequency,gross hematuria, or odorous/cloudy urine.  Urinary urgency and incontinence area occasional and chronic.  Lower abdominal pressure sensation,mild.  She has Hx of IBS.  Denies nausea,vomiting, vaginal bleeding or discharge.  LMP: Postmenopausal. Sexual activity: Denies   OTC medications for this problem: Cranberry juice.  She is also requesting refill on Lorazepam, which she takes about twice per week. She has been on this medication for many years, he helps with acute anxiety, she denies side effects. 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  She also mentions that about a weeks ago she had nausea and diarrhea after eating in a restaurant. Her mother had similar symptoms, she ate with her at the same restaurant. Symptoms have resolved.  She eats out frequently, not cooking much at home.   Review of Systems  Constitutional: Positive for fatigue. Negative for activity change, appetite change and fever.  Respiratory: Negative for shortness of breath.   Cardiovascular: Negative for palpitations and leg swelling.  Gastrointestinal: Negative for abdominal pain, blood in stool, nausea and vomiting.  Genitourinary: Positive for dysuria, frequency and urgency. Negative for decreased urine volume, hematuria, vaginal bleeding and vaginal discharge.  Musculoskeletal: Positive for back pain (Chronic). Negative for gait problem.  Neurological: Negative for syncope, weakness and headaches.  Hematological: Negative for adenopathy. Does not bruise/bleed easily.  Psychiatric/Behavioral: Negative for confusion. The patient is nervous/anxious.       Current Outpatient Prescriptions on File Prior to Visit  Medication Sig Dispense Refill  .  Aspirin-Caffeine 845-65 MG PACK Take 1-2 packets by mouth daily as needed (headache).    Marland Kitchen atenolol (TENORMIN) 50 MG tablet Take 1 tablet (50 mg total) by mouth daily. 90 tablet 1  . azelastine (ASTELIN) 0.1 % nasal spray Place 2 sprays into both nostrils 2 (two) times daily. Use in each nostril as directed 30 mL 11  . calcium carbonate (TUMS EX) 750 MG chewable tablet Chew 2 tablets by mouth daily as needed for heartburn.    . cyclobenzaprine (FLEXERIL) 10 MG tablet Take 2.5 mg by mouth daily as needed (pain level 8 or greater).     Marland Kitchen dimenhyDRINATE (DRAMAMINE) 50 MG tablet Take 50 mg by mouth every 8 (eight) hours as needed for nausea. Reported on 01/12/2016    . diphenhydrAMINE (BENADRYL) 25 MG tablet Take 25 mg by mouth daily as needed.    . fluticasone (FLONASE) 50 MCG/ACT nasal spray USE TWO SPRAY (S) IN EACH NOSTRIL ONCE DAILY. 48 g 2  . hydrochlorothiazide (HYDRODIURIL) 25 MG tablet TAKE ONE TABLET BY MOUTH ONCE DAILY 90 tablet 1  . HYDROcodone-acetaminophen (NORCO/VICODIN) 5-325 MG tablet Take 0.5 tablets by mouth daily as needed (pain level 8 or greater). 30 tablet 0  . hydrocortisone 2.5 % ointment APPLY TWICE A DAY 60 g 11  . montelukast (SINGULAIR) 4 MG chewable tablet Chew 1 tablet (4 mg total) by mouth at bedtime. 30 tablet 3  . Multiple Vitamins-Minerals (MULTI-VITAMIN GUMMIES PO) Take by mouth as needed.    Marland Kitchen omeprazole (PRILOSEC) 40 MG capsule Take 40 mg by mouth daily.    . Polyethylene Glycol 3350 (MIRALAX PO) Take by mouth.    . pravastatin (PRAVACHOL) 20 MG tablet Take 1 tablet (20 mg total) by mouth daily. 30 tablet 3  No current facility-administered medications on file prior to visit.      Past Medical History:  Diagnosis Date  . Allergy   . Anemia   . Anxiety   . Arthritis   . Blood in stool   . DDD (degenerative disc disease), lumbar   . GERD (gastroesophageal reflux disease)   . Headache   . Hypertension   . Obesity    Allergies  Allergen Reactions  .  Crestor [Rosuvastatin]   . Erythromycin     rash  . Lansoprazole   . Penicillins     rash  . Potassium-Containing Compounds Other (See Comments)    Stomach upset   . Pravastatin   . Robaxin [Methocarbamol]     Messed up stomach  . Sulfonamide Derivatives     rash  . Trental [Pentoxifylline Er]     Bad headache    Social History   Social History  . Marital status: Widowed    Spouse name: N/A  . Number of children: N/A  . Years of education: N/A   Social History Main Topics  . Smoking status: Former Smoker    Packs/day: 1.00    Years: 38.00    Types: Cigarettes    Quit date: 10/28/1997  . Smokeless tobacco: Never Used  . Alcohol use No  . Drug use: No  . Sexual activity: No   Other Topics Concern  . 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    Vitals:   07/06/17 0718  BP: 110/70  Pulse: 68  Resp: 12  SpO2: 95%   Body mass index is 45.79 kg/m.  Wt Readings from Last 3 Encounters:  07/06/17 250 lb 6 oz (113.6 kg)  04/27/17 250 lb 4 oz (113.5 kg)  03/01/17 252 lb (114.3 kg)    Physical Exam  Nursing note and vitals reviewed. Constitutional: She is oriented to person, place, and time. She appears well-developed. No distress.  HENT:  Head: Normocephalic and atraumatic.  Mouth/Throat: Oropharynx is clear and moist and mucous membranes are normal.  Eyes: Conjunctivae are normal.  Cardiovascular: Normal rate and regular rhythm.   Respiratory: Effort normal and breath sounds normal. No respiratory distress.  GI: Soft. Bowel sounds are normal. She exhibits no mass. There is tenderness in the suprapubic area and left lower quadrant. There is no CVA tenderness.  Musculoskeletal: She exhibits edema (Pitting LE edema, bilateral.). She exhibits no tenderness.  Lymphadenopathy:    She has no cervical adenopathy.  Neurological: She is alert and oriented to person, place, and time.  Skin: Skin is warm. No erythema.  Psychiatric:  Her mood appears anxious.  Well groomed, good eye contact.     ASSESSMENT AND PLAN:   [name] was seen today for dysuria.  Diagnoses and all orders for this visit:  UTI symptoms  Urine dipstick negative otherwise.  -     POCT urinalysis dipstick  Urinary tract infection without hematuria, site unspecified  Possible causes of reported urinary symptoms discussed. Ucx ordered.   Empiric abx treatment started today and will be tailored according to Ucx results and susceptibility report.  Clearly instructed about warning signs. F/U if symptoms persist.  -     ciprofloxacin (CIPRO) 250 MG tablet; Take 1 tablet (250 mg total) by mouth 2 (two) times daily.  Lower abdominal pain  Possible causes discussed. She has Hx of IBS as well, which could be causing pain. I do not think imaging or further  work up is needed at this time. Instructed about warning signs. F/U as needed.   Anxiety disorder, unspecified type  Stable. No changes in current management, side effects of Lorazepam discussed. F/U in 6  months.  -     LORazepam (ATIVAN) 0.5 MG tablet; Take 1 tablet (0.5 mg total) by mouth daily as needed for anxiety.  Need for influenza vaccination -     Flu Vaccine QUAD 36+ mos IM      26 min face to face OV. > 50% was dedicated to discussion of Dx, prognosis, treatment options, and some side effects of medications and abx.      -[name] was advised to return or notify a doctor immediately if symptoms worsen or persist or new concerns arise.       Allison Deshotels G. [name], MD  [facility]. [place] office.

## 2017-07-07 LAB — URINE CULTURE
MICRO NUMBER:: 81078943
SPECIMEN QUALITY:: ADEQUATE

## 2017-07-26 ENCOUNTER — Ambulatory Visit: Payer: Self-pay | Admitting: Family Medicine

## 2017-08-08 LAB — HM MAMMOGRAPHY

## 2017-08-10 ENCOUNTER — Encounter: Payer: Self-pay | Admitting: Family Medicine

## 2017-08-28 ENCOUNTER — Ambulatory Visit: Payer: Self-pay | Admitting: Family Medicine

## 2017-09-06 NOTE — Progress Notes (Signed)
HPI:   [name] is a 66 y.o. female, who is here today to follow on some chronic medical problems.  She is still following with ortho for chronic lower back pain. She had a fall on 08/24/17 and followed with ortho, she had imaging done of right hip and knee. She was started on Prednisone taper, still taking it. RLE pain is gradually getting better.    HTN: Currently on Atenolol 50 mg daily, this also has helped with palpitations. Atenolol was decreased a few months ago because bradycardia but she takes 50 mg as needed for intermittent palpitations. She has had this problem for a while. According to pt, she was evaluated by cardiologist. Episodes happen at rest, last about a minute or less. No associated symptoms. She is taking Atenolol 50 mg 2-3 times per week.  BP readings at home: 110-150's/70-80's, she attributes elevated BP's to pain.  Negative for headache, visual changes, chest pain, dyspnea, palpitation, claudication, focal weakness, or edema.  Lab Results  Component Value Date   CREATININE 0.99 04/27/2017   BUN 22 04/27/2017   NA 141 04/27/2017   K 3.9 04/27/2017   CL 102 04/27/2017   CO2 29 04/27/2017   Lab Results  Component Value Date   TSH 2.40 02/17/2016   Anxiety: She is on Lorazepam 0.5 mg daily as needed, usually takes it about 2-3 times per week. Taking care of her mother and this is stressful. She denies depression or suicidal thoughts. Tolerating medication well.  DM II  She is currently on non pharmacologic treatment. She is not checking BS's. Last eye exam recently , Dr [name].   Lab Results  Component Value Date   HGBA1C 6.6 (H) 04/27/2017    Lab Results  Component Value Date   MICROALBUR 0.7 01/25/2017   HLD:  Currently she is on Pravastatin 20 mg 3 times per week. She was having LE achy pains when she was taking it daily, these resolved when she decreased frequency.  Lab Results  Component Value Date   CHOL  202 (H) 04/27/2017   HDL 51.50 04/27/2017   LDLCALC 125 (H) 04/27/2017   TRIG 126.0 04/27/2017   CHOLHDL 4 04/27/2017    She has not changed her diet since her last OV. She states that her GI recommended bread, crackers, and to avoid fruit,juice and other foods due to GERD and IBS.  In regard to exercise she is walking 1 miles 3 times per week.    Review of Systems  Constitutional: Negative for activity change, appetite change, fatigue and fever.  HENT: Negative for mouth sores, nosebleeds and trouble swallowing.   Eyes: Negative for redness and visual disturbance.  Respiratory: Negative for cough, shortness of breath and wheezing.   Cardiovascular: Positive for palpitations. Negative for chest pain and leg swelling.  Gastrointestinal: Negative for abdominal pain, nausea and vomiting.       Negative for changes in bowel habits.  Endocrine: Negative for cold intolerance, heat intolerance, polydipsia, polyphagia and polyuria.  Genitourinary: Negative for decreased urine volume and hematuria.  Musculoskeletal: Positive for arthralgias and back pain. Negative for gait problem.  Skin: Negative for rash and wound.  Allergic/Immunologic: Positive for environmental allergies.  Neurological: Negative for syncope, weakness and headaches.  Psychiatric/Behavioral: Negative for confusion. The patient is nervous/anxious.       Current Outpatient Medications on File Prior to Visit  Medication Sig Dispense Refill  . Aspirin-Caffeine 845-65 MG PACK Take 1-2 packets by mouth  daily as needed (headache).    Marland Kitchen azelastine (ASTELIN) 0.1 % nasal spray Place 2 sprays into both nostrils 2 (two) times daily. Use in each nostril as directed 30 mL 11  . calcium carbonate (TUMS EX) 750 MG chewable tablet Chew 2 tablets by mouth daily as needed for heartburn.    . cyclobenzaprine (FLEXERIL) 10 MG tablet Take 2.5 mg by mouth daily as needed (pain level 8 or greater).     Marland Kitchen dimenhyDRINATE (DRAMAMINE) 50 MG  tablet Take 50 mg by mouth every 8 (eight) hours as needed for nausea. Reported on 01/12/2016    . diphenhydrAMINE (BENADRYL) 25 MG tablet Take 25 mg by mouth daily as needed.    . fluticasone (FLONASE) 50 MCG/ACT nasal spray USE TWO SPRAY (S) IN EACH NOSTRIL ONCE DAILY. 48 g 2  . hydrochlorothiazide (HYDRODIURIL) 25 MG tablet TAKE ONE TABLET BY MOUTH ONCE DAILY 90 tablet 1  . HYDROcodone-acetaminophen (NORCO/VICODIN) 5-325 MG tablet Take 0.5 tablets by mouth daily as needed (pain level 8 or greater). 30 tablet 0  . hydrocortisone 2.5 % ointment APPLY TWICE A DAY 60 g 11  . LORazepam (ATIVAN) 0.5 MG tablet Take 1 tablet (0.5 mg total) by mouth daily as needed for anxiety. 20 tablet 1  . Multiple Vitamins-Minerals (MULTI-VITAMIN GUMMIES PO) Take by mouth as needed.    Marland Kitchen omeprazole (PRILOSEC) 40 MG capsule Take 40 mg by mouth daily.    . Polyethylene Glycol 3350 (MIRALAX PO) Take by mouth.    . pravastatin (PRAVACHOL) 20 MG tablet Take 1 tablet (20 mg total) by mouth daily. 30 tablet 3  . predniSONE (STERAPRED UNI-PAK 21 TAB) 5 MG (21) TBPK tablet See admin instructions.  0  . montelukast (SINGULAIR) 4 MG chewable tablet Chew 1 tablet (4 mg total) by mouth at bedtime. (Patient not taking: Reported on 09/07/2017) 30 tablet 3   No current facility-administered medications on file prior to visit.      Past Medical History:  Diagnosis Date  . Allergy   . Anemia   . Anxiety   . Arthritis   . Blood in stool   . DDD (degenerative disc disease), lumbar   . GERD (gastroesophageal reflux disease)   . Headache   . Hypertension   . Obesity    Allergies  Allergen Reactions  . Crestor [Rosuvastatin]   . Erythromycin     rash  . Lansoprazole   . Penicillins     rash  . Potassium-Containing Compounds Other (See Comments)    Stomach upset   . Pravastatin   . Robaxin [Methocarbamol]     Messed up stomach  . Sulfonamide Derivatives     rash  . Trental [Pentoxifylline Er]     Bad headache     Social History   Socioeconomic History  . Marital status: Widowed    Spouse name: None  . Number of children: None  . Years of education: None  . Highest education level: None  Social Needs  . Financial resource strain: None  . Food insecurity - worry: None  . Food insecurity - inability: None  . Transportation needs - medical: None  . Transportation needs - non-medical: None  Occupational History  . None  Tobacco Use  . Smoking status: Former Smoker    Packs/day: 1.00    Years: 38.00    Pack years: 38.00    Types: Cigarettes    Last attempt to quit: 10/28/1997    Years since quitting: 19.8  .  Smokeless tobacco: Never Used  Substance and Sexual Activity  . Alcohol use: No  . Drug use: No  . Sexual activity: No  Other Topics Concern  . 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    Vitals:   09/07/17 0814 09/07/17 0905  BP: 118/74   Pulse: 68 (!) 56  Resp: 12   Temp: 97.9 F (36.6 C)   SpO2: 96%    Body mass index is 46.37 kg/m.  Wt Readings from Last 3 Encounters:  09/07/17 253 lb 8 oz (115 kg)  07/06/17 250 lb 6 oz (113.6 kg)  04/27/17 250 lb 4 oz (113.5 kg)    Physical Exam  Nursing note and vitals reviewed. Constitutional: She is oriented to person, place, and time. She appears well-developed. No distress.  HENT:  Head: Normocephalic and atraumatic.  Mouth/Throat: Oropharynx is clear and moist and mucous membranes are normal.  Eyes: Conjunctivae are normal. Pupils are equal, round, and reactive to light.  Neck: No tracheal deviation present. No thyroid mass and no thyromegaly present.  Cardiovascular: An irregular rhythm present.  Occasional extrasystoles are present. Bradycardia present.  No murmur heard. Pulses:      Dorsalis pedis pulses are 2+ on the right side, and 2+ on the left side.  Respiratory: Effort normal and breath sounds normal. No respiratory distress.  GI: Soft. She exhibits no mass. There is no  hepatomegaly. There is no tenderness.  Musculoskeletal: She exhibits edema (Trace pitting LE edema,bilateral). She exhibits no tenderness.       Lumbar back: She exhibits no tenderness and no bony tenderness.  Lymphadenopathy:    She has no cervical adenopathy.  Neurological: She is alert and oriented to person, place, and time. She has normal strength. Gait normal.  Skin: Skin is warm. No rash noted. No erythema.  Psychiatric: Her mood appears anxious.  Well groomed, good eye contact.   Foot exam 01/2017  ASSESSMENT AND PLAN:   [name] was seen today for follow-up.  Diagnoses and all orders for this visit:  Lab Results  Component Value Date   HGBA1C 6.7 (H) 09/07/2017   Lab Results  Component Value Date   CHOL 170 09/07/2017   HDL 51.90 09/07/2017   LDLCALC 100 (H) 09/07/2017   TRIG 93.0 09/07/2017   CHOLHDL 3 09/07/2017    Hyperlipidemia associated with type 2 diabetes mellitus ([place])  No changes in current management, will follow labs done today and will give further recommendations accordingly. F/U in 6-12 months.  -     Lipid panel  Controlled type 2 diabetes mellitus without complication, without long-term current use of insulin ([place])  HgA1C has been at goal. Continue non pharmacologic treatment for now. Regular exercise and healthy diet with avoidance of added sugar food intake is an important part of treatment and recommended. Annual eye exam, periodic dental and foot care recommended. F/U in 5-6 months  -     Hemoglobin A1c -     Fructosamine  Essential hypertension  Adequately controlled. Because bradycardia,Atenolol decreased from 25 mg to 12.5 mg daily and Benazepril 10 mg added. BMP in 7-10 days. Low salt diet recommended. Eye exam reported as current. F/U in 2 months, before if needed.  -     atenolol (TENORMIN) 25 MG tablet; Take 0.5 tablets (12.5 mg total) by mouth daily. -     benazepril (LOTENSIN) 10 MG tablet; Take 1 tablet (10 mg  total) by mouth daily.  BMI  45.0-49.9, adult [facility])  We discussed benefits of wt loss as well as adverse effects of obesity. Consistency with healthy diet and physical activity recommended. Any type of regular physical activity will help, upper extremity exercises a few times per day and low impact exercise recommended.  Anxiety disorder, unspecified type  Stable overall. Palpitations she is describing could be related to anxiety. For now no changes in Lorazepam, some side effects discussed.  Bradycardia, sinus  Asymptomatic. Side effects of BB discussed. Atenolol decreased from 25 mg to 12.5 mg, we will continue weaning off if bradycardia persists. EKG today: Sinus arrhythmia with bradycardia. Normal axis and no ischemia. Compared with EKG 03/2016 no significant changes. Instructed about warning signs. Instructed to monitor HR at home. F/U in 2 months.  -     EKG 12-Lead -     TSH        -[name] was advised to return sooner than planned today if new concerns arise.       [name]. [name], MD  [facility]. [place] office.

## 2017-09-07 ENCOUNTER — Encounter: Payer: Self-pay | Admitting: Family Medicine

## 2017-09-07 ENCOUNTER — Ambulatory Visit (INDEPENDENT_AMBULATORY_CARE_PROVIDER_SITE_OTHER): Payer: BC Managed Care – PPO | Admitting: Family Medicine

## 2017-09-07 VITALS — BP 118/74 | HR 56 | Temp 97.9°F | Resp 12 | Ht 62.0 in | Wt 253.5 lb

## 2017-09-07 DIAGNOSIS — E1169 Type 2 diabetes mellitus with other specified complication: Secondary | ICD-10-CM | POA: Diagnosis not present

## 2017-09-07 DIAGNOSIS — E785 Hyperlipidemia, unspecified: Secondary | ICD-10-CM

## 2017-09-07 DIAGNOSIS — E119 Type 2 diabetes mellitus without complications: Secondary | ICD-10-CM | POA: Diagnosis not present

## 2017-09-07 DIAGNOSIS — R001 Bradycardia, unspecified: Secondary | ICD-10-CM

## 2017-09-07 DIAGNOSIS — Z6841 Body Mass Index (BMI) 40.0 and over, adult: Secondary | ICD-10-CM

## 2017-09-07 DIAGNOSIS — I10 Essential (primary) hypertension: Secondary | ICD-10-CM

## 2017-09-07 DIAGNOSIS — F419 Anxiety disorder, unspecified: Secondary | ICD-10-CM | POA: Diagnosis not present

## 2017-09-07 LAB — LIPID PANEL
CHOLESTEROL: 170 mg/dL (ref 0–200)
HDL: 51.9 mg/dL (ref >39.00)
LDL Cholesterol: 100 mg/dL — ABNORMAL HIGH (ref 0–99)
NonHDL: 118.2
Total CHOL/HDL Ratio: 3
Triglycerides: 93 mg/dL (ref 0.0–149.0)
VLDL: 18.6 mg/dL (ref 0.0–40.0)

## 2017-09-07 LAB — HEMOGLOBIN A1C: HEMOGLOBIN A1C: 6.7 % — AB (ref 4.6–6.5)

## 2017-09-07 LAB — TSH: TSH: 2.47 u[IU]/mL (ref 0.35–4.50)

## 2017-09-07 MED ORDER — BENAZEPRIL HCL 10 MG PO TABS: 10.0000 mg | ORAL_TABLET | Freq: Every day | ORAL | 2 refills | Status: DC

## 2017-09-07 MED ORDER — ATENOLOL 25 MG PO TABS: 12.5000 mg | ORAL_TABLET | Freq: Every day | ORAL | 1 refills | Status: DC

## 2017-09-07 NOTE — Patient Instructions (Addendum)
A few things to remember from today's visit:   Controlled type 2 diabetes mellitus without complication, without long-term current use of insulin ([place]) - Plan: Hemoglobin A1c, Fructosamine  Essential hypertension - Plan: atenolol (TENORMIN) 25 MG tablet, benazepril (LOTENSIN) 10 MG tablet, CANCELED: Comprehensive metabolic panel  BMI 67.5-91.6, adult (HCC)  Anxiety disorder, unspecified type  Hyperlipidemia associated with type 2 diabetes mellitus ([place]) - Plan: Lipid panel  Bradycardia, sinus - Plan: EKG 12-Lead  Atenolol changed to 12.5 mg daily. Check pulse at home. Benazepril 10 mg added. No changes in rest.  Lab in 10 days.   Please be sure medication list is accurate. If a new problem present, please set up appointment sooner than planned today.

## 2017-09-10 LAB — FRUCTOSAMINE: Fructosamine: 206 umol/L (ref 190–270)

## 2017-09-21 ENCOUNTER — Other Ambulatory Visit (INDEPENDENT_AMBULATORY_CARE_PROVIDER_SITE_OTHER): Payer: BC Managed Care – PPO

## 2017-09-21 DIAGNOSIS — E1169 Type 2 diabetes mellitus with other specified complication: Secondary | ICD-10-CM | POA: Diagnosis not present

## 2017-09-21 DIAGNOSIS — I10 Essential (primary) hypertension: Secondary | ICD-10-CM

## 2017-09-21 DIAGNOSIS — E785 Hyperlipidemia, unspecified: Secondary | ICD-10-CM | POA: Diagnosis not present

## 2017-09-21 LAB — COMPREHENSIVE METABOLIC PANEL
ALT: 18 U/L (ref 0–35)
AST: 21 U/L (ref 0–37)
Albumin: 4.3 g/dL (ref 3.5–5.2)
Alkaline Phosphatase: 62 U/L (ref 39–117)
BILIRUBIN TOTAL: 0.3 mg/dL (ref 0.2–1.2)
BUN: 17 mg/dL (ref 6–23)
CO2: 31 meq/L (ref 19–32)
Calcium: 9.7 mg/dL (ref 8.4–10.5)
Chloride: 100 mEq/L (ref 96–112)
Creatinine, Ser: 0.98 mg/dL (ref 0.40–1.20)
GFR: 73.44 mL/min (ref >60.00)
GLUCOSE: 119 mg/dL — AB (ref 70–99)
POTASSIUM: 3.6 meq/L (ref 3.5–5.1)
Sodium: 141 mEq/L (ref 135–145)
Total Protein: 6.9 g/dL (ref 6.0–8.3)

## 2017-09-24 ENCOUNTER — Encounter: Payer: Self-pay | Admitting: Family Medicine

## 2017-09-24 ENCOUNTER — Ambulatory Visit: Payer: Self-pay | Admitting: *Deleted

## 2017-09-24 ENCOUNTER — Ambulatory Visit (INDEPENDENT_AMBULATORY_CARE_PROVIDER_SITE_OTHER): Payer: BC Managed Care – PPO | Admitting: Family Medicine

## 2017-09-24 VITALS — BP 155/95 | HR 60 | Temp 98.1°F | Resp 12 | Ht 62.0 in | Wt 248.4 lb

## 2017-09-24 DIAGNOSIS — R001 Bradycardia, unspecified: Secondary | ICD-10-CM

## 2017-09-24 DIAGNOSIS — I10 Essential (primary) hypertension: Secondary | ICD-10-CM | POA: Diagnosis not present

## 2017-09-24 DIAGNOSIS — F419 Anxiety disorder, unspecified: Secondary | ICD-10-CM | POA: Diagnosis not present

## 2017-09-24 MED ORDER — AMLODIPINE BESY-BENAZEPRIL HCL 5-10 MG PO CAPS: 1.0000 | ORAL_CAPSULE | Freq: Every day | ORAL | 1 refills | Status: DC

## 2017-09-24 NOTE — Patient Instructions (Signed)
   A few things to remember from today's visit:   Essential hypertension - Plan: amLODipine-benazepril (LOTREL) 5-10 MG capsule  Bradycardia, sinus  Blood pressure goal for most people is less than 140/90. Some populations (older than 60) the goal is less than 150/90.  Most recent cardiologists' recommendations recommend blood pressure at or less than 130/80.  Amlodipine 5 mg added and combination pill (benazepril and Amlodipine sent), take it at bedtime.  No changes in rest.   Elevated blood pressure increases the risk of strokes, heart and kidney disease, and eye problems. Regular physical activity and a healthy diet (DASH diet) usually help. Low salt diet. Take medications as instructed.  Caution with some over the counter medications as cold medications, dietary products (for weight loss), and Ibuprofen or Aleve (frequent use);all these medications could cause elevation of blood pressure. Please be sure medication list is accurate. If a new problem present, please set up appointment sooner than planned today.

## 2017-09-24 NOTE — Telephone Encounter (Signed)
I spoke with Dr. [name], she did review this note. She would like [place] to call pt and verify if patient has any SOB, chest pain or blurred vision? I sopke with pt and she did not have any of these symptoms, just a slight headache. Per Dr. [name] okay for pt to keep appointment this morning and bring blood pressure monitor. Advised pt if she has any of the above symptoms she should go to ER, also advised pt that with hight readings like this there is a possibility of having a stroke or cardiac event, pt understands.

## 2017-09-24 NOTE — Telephone Encounter (Signed)
Called in c/o her BP being high.  The  reading this morning 224/148.  She has been running 221/121, 245/89 yesterday.   I encouraged her to go the the ED but she would not go.   "I can't afford to go to the emergency room"   "I'm also taking take of my mother and can't leave her here alone"   I asked her if I got her an appt with Dr. [name] today could she leave her mother to come to the appt she said yes if it could be this morning.   I got an appt with Dr. [name] at 11:00 today since she would not go to the ED. I instructed her to call us back or call 911 if she had any s/s of a stroke which I went over with her.   She verbalized understanding.   Reason for Disposition . [0] Systolic BP  >= 762 OR Diastolic >= 263  AND [3] having NO cardiac or neurologic symptoms  Answer Assessment - Initial Assessment Questions 1. BLOOD PRESSURE: "What is the blood pressure?" "Did you take at least two measurements 5 minutes apart?"     224/148 2. ONSET: "When did you take your blood pressure?"     Just  a few minutes ago. 3. HOW: "How did you obtain the blood pressure?" (e.g., visiting nurse, automatic home BP monitor)     I have my own machine 4. HISTORY: "Do you have a history of high blood pressure?"     Yes 5. MEDICATIONS: "Are you taking any medications for blood pressure?" "Have you missed any doses recently?"     Yes I didn't take it this morning.   I had her take it now.   6. OTHER SYMPTOMS: "Do you have any symptoms?" (e.g., headache, chest pain, blurred vision, difficulty breathing, weakness)     I have a slight headache that I took a BP Powder for.   7. PREGNANCY: "Is there any chance you are pregnant?" "When was your last menstrual period?"     Not asked  Protocols used: HIGH BLOOD PRESSURE-A-AH

## 2017-09-24 NOTE — Progress Notes (Signed)
ACUTE VISIT   HPI:  Chief Complaint  Patient presents with  . Hypertension    [name] is a 66 y.o. female, who is here today complaining of elevated BP.  She was seen last on September 07, 2017. History of palpitations, Atenolol dose was decreased because sinus bradycardia. Had an episode of palpitations at rest last week but mild and no associated symptoms.  She has not identified exacerbating or alleviating factors, usually last a few seconds. HR 52-80's/min.   Currently she is on Atenolol 25 mg half tablet daily, today she took 1 tablet today because of elevated BP. HCTZ 12.5 mg daily and recently added Benazepril 10 mg daily. She is concerned because, according to patient, she read that "black people do not react well to Benazepril"  Lab Results  Component Value Date   CREATININE 0.98 09/21/2017   BUN 17 09/21/2017   NA 141 09/21/2017   K 3.6 09/21/2017   CL 100 09/21/2017   CO2 31 09/21/2017   Home BP readings160-276/88-114. She is following low-salt diet. She has under some stress for the past 1-2 weeks  due to her mother's health issues.  "Light" parietal headache,dull,"not bad."  Denies visual changes, chest pain, dyspnea,claudication, focal weakness, or edema.  History of anxiety, currently she is on Lorazepam 0.5 mg daily as needed.  Review of Systems  Constitutional: Negative for activity change, appetite change, fatigue and fever.  HENT: Negative for mouth sores, nosebleeds and trouble swallowing.   Eyes: Negative for photophobia, redness and visual disturbance.  Respiratory: Negative for cough, shortness of breath and wheezing.   Cardiovascular: Positive for palpitations. Negative for chest pain and leg swelling.  Gastrointestinal: Negative for abdominal pain, nausea and vomiting.       Negative for changes in bowel habits.  Endocrine: Negative for cold intolerance and heat intolerance.  Genitourinary: Negative for decreased  urine volume, dysuria and hematuria.  Musculoskeletal: Negative for gait problem.  Skin: Negative for pallor and rash.  Neurological: Positive for headaches. Negative for seizures, syncope and weakness.  Psychiatric/Behavioral: Negative for confusion. The patient is nervous/anxious.       Current Outpatient Medications on File Prior to Visit  Medication Sig Dispense Refill  . Aspirin-Caffeine 845-65 MG PACK Take 1-2 packets by mouth daily as needed (headache).    Marland Kitchen atenolol (TENORMIN) 25 MG tablet Take 0.5 tablets (12.5 mg total) by mouth daily. 30 tablet 1  . azelastine (ASTELIN) 0.1 % nasal spray Place 2 sprays into both nostrils 2 (two) times daily. Use in each nostril as directed 30 mL 11  . calcium carbonate (TUMS EX) 750 MG chewable tablet Chew 2 tablets by mouth daily as needed for heartburn.    . cyclobenzaprine (FLEXERIL) 10 MG tablet Take 2.5 mg by mouth daily as needed (pain level 8 or greater).     Marland Kitchen dimenhyDRINATE (DRAMAMINE) 50 MG tablet Take 50 mg by mouth every 8 (eight) hours as needed for nausea. Reported on 01/12/2016    . diphenhydrAMINE (BENADRYL) 25 MG tablet Take 25 mg by mouth daily as needed.    . fluticasone (FLONASE) 50 MCG/ACT nasal spray USE TWO SPRAY (S) IN EACH NOSTRIL ONCE DAILY. 48 g 2  . hydrochlorothiazide (HYDRODIURIL) 25 MG tablet TAKE ONE TABLET BY MOUTH ONCE DAILY 90 tablet 1  . HYDROcodone-acetaminophen (NORCO/VICODIN) 5-325 MG tablet Take 0.5 tablets by mouth daily as needed (pain level 8 or greater). 30 tablet 0  . hydrocortisone 2.5 % ointment  APPLY TWICE A DAY 60 g 11  . LORazepam (ATIVAN) 0.5 MG tablet Take 1 tablet (0.5 mg total) by mouth daily as needed for anxiety. 20 tablet 1  . montelukast (SINGULAIR) 4 MG chewable tablet Chew 1 tablet (4 mg total) by mouth at bedtime. 30 tablet 3  . Multiple Vitamins-Minerals (MULTI-VITAMIN GUMMIES PO) Take by mouth as needed.    Marland Kitchen omeprazole (PRILOSEC) 40 MG capsule Take 40 mg by mouth daily.    .  Polyethylene Glycol 3350 (MIRALAX PO) Take by mouth.    . pravastatin (PRAVACHOL) 20 MG tablet Take 1 tablet (20 mg total) by mouth daily. 30 tablet 3   No current facility-administered medications on file prior to visit.      Past Medical History:  Diagnosis Date  . Allergy   . Anemia   . Anxiety   . Arthritis   . Blood in stool   . DDD (degenerative disc disease), lumbar   . GERD (gastroesophageal reflux disease)   . Headache   . Hypertension   . Obesity    Allergies  Allergen Reactions  . Crestor [Rosuvastatin]   . Erythromycin     rash  . Lansoprazole   . Penicillins     rash  . Potassium-Containing Compounds Other (See Comments)    Stomach upset   . Pravastatin   . Robaxin [Methocarbamol]     Messed up stomach  . Sulfonamide Derivatives     rash  . Trental [Pentoxifylline Er]     Bad headache    Social History   Socioeconomic History  . Marital status: Widowed    Spouse name: None  . Number of children: None  . Years of education: None  . Highest education level: None  Social Needs  . Financial resource strain: None  . Food insecurity - worry: None  . Food insecurity - inability: None  . Transportation needs - medical: None  . Transportation needs - non-medical: None  Occupational History  . None  Tobacco Use  . Smoking status: Former Smoker    Packs/day: 1.00    Years: 38.00    Pack years: 38.00    Types: Cigarettes    Last attempt to quit: 10/28/1997    Years since quitting: 19.9  . Smokeless tobacco: Never Used  Substance and Sexual Activity  . Alcohol use: No  . Drug use: No  . Sexual activity: No  Other Topics Concern  . 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    Vitals:   09/24/17 1049 09/24/17 1156  BP: (!) 188/100 (!) 155/95  Pulse: 60   Resp: 12   Temp: 98.1 F (36.7 C)   SpO2: 97%    Body mass index is 45.43 kg/m.   Physical Exam  Nursing note and vitals reviewed. Constitutional:  She is oriented to person, place, and time. She appears well-developed. No distress.  HENT:  Head: Normocephalic and atraumatic.  Mouth/Throat: Oropharynx is clear and moist and mucous membranes are normal.  Eyes: Conjunctivae are normal. Pupils are equal, round, and reactive to light.  Neck: No JVD present.  Cardiovascular: Normal rate and regular rhythm.  No murmur heard. Pulses:      Dorsalis pedis pulses are 2+ on the right side, and 2+ on the left side.  Respiratory: Effort normal and breath sounds normal. No respiratory distress.  GI: Soft. She exhibits no mass. There is no hepatomegaly. There is no tenderness.  Musculoskeletal: She exhibits  edema (trace pitting LE edema,bilateral.). She exhibits no tenderness.  Lymphadenopathy:    She has no cervical adenopathy.  Neurological: She is alert and oriented to person, place, and time. She has normal strength. No cranial nerve deficit. Coordination and gait normal.  Skin: Skin is warm. No rash noted. No erythema.  Psychiatric: Her mood appears anxious.  Well groomed, good eye contact.     ASSESSMENT AND PLAN:   [name] was seen today for hypertension.  Diagnoses and all orders for this visit:  Essential hypertension  Not well controlled. Possible complications of elevated BP discussed. We discussed options in regard to pharmacologic treatment and benefits of certain medications on specific population, including African-American patients. She agrees to continue Benazepril 10 mg and starting Amlodipine 5 mg daily.  Some side effects discussed. For now no changes on Atenolol 12.5 mg daily and HCTZ. Continue monitor BP. Clearly instructed about warning signs. Follow-up in 1 week.  -     amLODipine-benazepril (LOTREL) 5-10 MG capsule; Take 1 capsule by mouth daily.  Bradycardia, sinus  Improved after decreasing Atenolol and asymptomatic. For now she will continue Atenolol but we will need to consider weaning medication  of if problem persist. Continue monitoring HR at home. Instructed about warning signs. Follow-up in a week.  Anxiety disorder, unspecified type  Problem might be aggravating HTN. It is stable overall. She is not interested in daily anxiolytic medication. No changes in current management.    Return in about 1 week (around 10/01/2017) for HTN.     -[name] was advised to seek immediate medical attention if sudden worsening symptoms.     Chloe Bluett G. [name], MD  [facility]. [place] office.

## 2017-09-26 ENCOUNTER — Ambulatory Visit: Payer: Self-pay

## 2017-09-26 NOTE — Telephone Encounter (Signed)
I spoke with CMA and will forward for review, pt was recently seen here in office for elevated blood pressure.

## 2017-09-26 NOTE — Telephone Encounter (Signed)
Pt. Reports BP this morning 160/110 pulse 61. Pt. Feels fine. Wants doctor to know.

## 2017-09-26 NOTE — Telephone Encounter (Signed)
Noted. She has F/U appt in a few days and we have discussed warning signs. BJ

## 2017-09-26 NOTE — Telephone Encounter (Signed)
FYI routed to PCP

## 2017-10-01 ENCOUNTER — Ambulatory Visit: Payer: Self-pay | Admitting: Family Medicine

## 2017-10-09 NOTE — Progress Notes (Signed)
[name] is a 67 y.o.female, who is here today to follow on HTN. She was last seen on 09/24/17, when Amlodipine 5 mg was added. She is also on Benazepril 10 mg,Atenolol 12.5 mg daily and HCTZ 12.5 mg daily.  HCTZ she takes as needed for lower extremity edema, she has tried higher doses but it causes urinary frequency.  She is taking medications as instructed, no side effects reported.  She has not noted visual changes, exertional chest pain, dyspnea,focal weakness, or edema.  Home BP readings: 160-201/90-168.  Lab Results  Component Value Date   CREATININE 0.98 09/21/2017   BUN 17 09/21/2017   NA 141 09/21/2017   K 3.6 09/21/2017   CL 100 09/21/2017   CO2 31 09/21/2017   She increased Atenolol dose from 12.5 mg daily to 75 mg daily (25 mg AM and 12.5 mg at night)right ear ache-checked 180/93.She has been checking HR, the lowest has been 53/minute.  Left temporal headache, throbbing/pressure, intermittently, 7/10 sometimes.  Occasionally she has some nausea. She takes OTC BCs. She denies focal deficit or MS changes. History of chronic pain, which has been worse after accidental fall.  She wonders if fall has something to do with elevated blood pressure.  She follows with orthopedics and has followed recently. She does not take OTC NSAIDs. She eats out about 3 times per week.  Still dealing with some stress at home, related to her mother's chronic health problems.  Review of Systems  Constitutional: Positive for fatigue. Negative for activity change, appetite change, fever and unexpected weight change.  HENT: Negative for mouth sores, nosebleeds and trouble swallowing.   Eyes: Negative for redness and visual disturbance.  Respiratory: Negative for cough, shortness of breath and wheezing.   Cardiovascular: Negative for chest pain, palpitations and leg swelling.  Gastrointestinal: Negative for abdominal pain, nausea and vomiting.       Negative for changes in  bowel habits.  Endocrine: Negative for cold intolerance and heat intolerance.  Genitourinary: Negative for decreased urine volume and hematuria.  Musculoskeletal: Positive for back pain. Negative for gait problem.  Skin: Negative for pallor and rash.  Allergic/Immunologic: Positive for environmental allergies.  Neurological: Positive for headaches. Negative for seizures, syncope and weakness.  Hematological: Negative for adenopathy. Does not bruise/bleed easily.  Psychiatric/Behavioral: Positive for sleep disturbance. Negative for confusion. The patient is nervous/anxious.      Current Outpatient Medications on File Prior to Visit  Medication Sig Dispense Refill  . amLODipine-benazepril (LOTREL) 5-10 MG capsule Take 1 capsule by mouth daily. 30 capsule 1  . Aspirin-Caffeine 845-65 MG PACK Take 1-2 packets by mouth daily as needed (headache).    Marland Kitchen atenolol (TENORMIN) 25 MG tablet Take 0.5 tablets (12.5 mg total) by mouth daily. 30 tablet 1  . azelastine (ASTELIN) 0.1 % nasal spray Place 2 sprays into both nostrils 2 (two) times daily. Use in each nostril as directed 30 mL 11  . calcium carbonate (TUMS EX) 750 MG chewable tablet Chew 2 tablets by mouth daily as needed for heartburn.    . cyclobenzaprine (FLEXERIL) 10 MG tablet Take 2.5 mg by mouth daily as needed (pain level 8 or greater).     Marland Kitchen dimenhyDRINATE (DRAMAMINE) 50 MG tablet Take 50 mg by mouth every 8 (eight) hours as needed for nausea. Reported on 01/12/2016    . diphenhydrAMINE (BENADRYL) 25 MG tablet Take 25 mg by mouth daily as needed.    . fluticasone (FLONASE) 50 MCG/ACT nasal  spray USE TWO SPRAY (S) IN EACH NOSTRIL ONCE DAILY. 48 g 2  . hydrochlorothiazide (HYDRODIURIL) 25 MG tablet TAKE ONE TABLET BY MOUTH ONCE DAILY 90 tablet 1  . HYDROcodone-acetaminophen (NORCO/VICODIN) 5-325 MG tablet Take 0.5 tablets by mouth daily as needed (pain level 8 or greater). 30 tablet 0  . hydrocortisone 2.5 % ointment APPLY TWICE A DAY 60 g 11    . LORazepam (ATIVAN) 0.5 MG tablet Take 1 tablet (0.5 mg total) by mouth daily as needed for anxiety. 20 tablet 1  . Multiple Vitamins-Minerals (MULTI-VITAMIN GUMMIES PO) Take by mouth as needed.    Marland Kitchen omeprazole (PRILOSEC) 40 MG capsule Take 40 mg by mouth daily.    . Polyethylene Glycol 3350 (MIRALAX PO) Take by mouth.    . pravastatin (PRAVACHOL) 20 MG tablet Take 1 tablet (20 mg total) by mouth daily. 30 tablet 3  . montelukast (SINGULAIR) 4 MG chewable tablet Chew 1 tablet (4 mg total) by mouth at bedtime. (Patient not taking: Reported on 10/10/2017) 30 tablet 3   No current facility-administered medications on file prior to visit.      Past Medical History:  Diagnosis Date  . Allergy   . Anemia   . Anxiety   . Arthritis   . Blood in stool   . DDD (degenerative disc disease), lumbar   . GERD (gastroesophageal reflux disease)   . Headache   . Hypertension   . Obesity     Allergies  Allergen Reactions  . Crestor [Rosuvastatin]   . Erythromycin     rash  . Lansoprazole   . Penicillins     rash  . Potassium-Containing Compounds Other (See Comments)    Stomach upset   . Pravastatin   . Robaxin [Methocarbamol]     Messed up stomach  . Sulfonamide Derivatives     rash  . Trental [Pentoxifylline Er]     Bad headache    Social History   Socioeconomic History  . Marital status: Widowed    Spouse name: None  . Number of children: None  . Years of education: None  . Highest education level: None  Social Needs  . Financial resource strain: None  . Food insecurity - worry: None  . Food insecurity - inability: None  . Transportation needs - medical: None  . Transportation needs - non-medical: None  Occupational History  . None  Tobacco Use  . Smoking status: Former Smoker    Packs/day: 1.00    Years: 38.00    Pack years: 38.00    Types: Cigarettes    Last attempt to quit: 10/28/1997    Years since quitting: 19.9  . Smokeless tobacco: Never Used  Substance and  Sexual Activity  . Alcohol use: No  . Drug use: No  . Sexual activity: No  Other Topics Concern  . 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    Vitals:   10/10/17 0941  BP: (!) 160/88  Pulse: (!) 52  Resp: 12  Temp: 97.9 F (36.6 C)  SpO2: 98%   Body mass index is 45.36 kg/m.    Physical Exam  Nursing note and vitals reviewed. Constitutional: She is oriented to person, place, and time. She appears well-developed. No distress.  HENT:  Head: Normocephalic and atraumatic.  Mouth/Throat: Oropharynx is clear and moist and mucous membranes are normal.  Tenderness upon palpation of left parietotemporal scalp.  Eyes: Conjunctivae and EOM are normal. Pupils are equal, round,  and reactive to light.  Neck: No JVD present.  Cardiovascular: Regular rhythm. Bradycardia present.  No murmur heard. Pulses:      Dorsalis pedis pulses are 2+ on the right side, and 2+ on the left side.  Respiratory: Effort normal and breath sounds normal. No respiratory distress.  GI: Soft. She exhibits no mass. There is no hepatomegaly. There is no tenderness.  Musculoskeletal: She exhibits no edema.       Cervical back: She exhibits no tenderness and no bony tenderness.  Lymphadenopathy:    She has no cervical adenopathy.  Neurological: She is alert and oriented to person, place, and time. She has normal strength. No cranial nerve deficit. Gait normal.  Skin: Skin is warm. No erythema.  Psychiatric: Her mood appears anxious.  Fairly groomed, good eye contact.    ASSESSMENT AND PLAN:   [name] was seen today for follow-up.  Diagnoses and all orders for this visit:  Left temporal headache  Discussed possible etiologies, certainly can be related to elevated BPs. Tension headache also to be considered. She was clearly instructed about warning signs. I do not think brain imaging is needed at this time. Further recommendations will be given according to lab  results.  -     Sedimentation rate -     C-reactive protein  Essential hypertension  Still not well controlled.Re Possible complications of elevated BP discussed. After discussion of options at this time, she does not want to take HCTZ daily or higher doses.  She still has Lotrel 5-10 mg, so she agrees with increasing Amlodipine and Benazepril to 10-20 mg daily. Continue monitoring BP. Low-salt diet discussed and recommended. Referral to cardiology was placed as requested. She was clearly instructed about warning signs. Follow-up in 2 weeks.   -     Basic metabolic panel -     Ambulatory referral to Cardiology  Bradycardia, sinus  We discussed side effects of beta-blockers, recommend decreasing Atenolol dose back to 12.5 mg daily. Instructed about warning signs.    -Ms. Sindhu Nguyen was advised to return sooner than planned today if new concerns arise.     Traveon Louro G. [name], MD  [facility]. [place] office.

## 2017-10-10 ENCOUNTER — Encounter: Payer: Self-pay | Admitting: Family Medicine

## 2017-10-10 ENCOUNTER — Ambulatory Visit: Payer: BC Managed Care – PPO | Admitting: Family Medicine

## 2017-10-10 VITALS — BP 160/88 | HR 52 | Temp 97.9°F | Resp 12 | Ht 62.0 in | Wt 248.0 lb

## 2017-10-10 DIAGNOSIS — R519 Headache, unspecified: Secondary | ICD-10-CM

## 2017-10-10 DIAGNOSIS — I10 Essential (primary) hypertension: Secondary | ICD-10-CM | POA: Diagnosis not present

## 2017-10-10 DIAGNOSIS — R51 Headache: Secondary | ICD-10-CM | POA: Diagnosis not present

## 2017-10-10 DIAGNOSIS — R001 Bradycardia, unspecified: Secondary | ICD-10-CM | POA: Diagnosis not present

## 2017-10-10 LAB — BASIC METABOLIC PANEL
BUN: 21 mg/dL (ref 6–23)
CALCIUM: 10 mg/dL (ref 8.4–10.5)
CO2: 30 meq/L (ref 19–32)
Chloride: 100 mEq/L (ref 96–112)
Creatinine, Ser: 0.84 mg/dL (ref 0.40–1.20)
GFR: 87.73 mL/min (ref >60.00)
Glucose, Bld: 113 mg/dL — ABNORMAL HIGH (ref 70–99)
Potassium: 3.9 mEq/L (ref 3.5–5.1)
SODIUM: 140 meq/L (ref 135–145)

## 2017-10-10 LAB — C-REACTIVE PROTEIN: CRP: 1 mg/dL (ref 0.5–20.0)

## 2017-10-10 LAB — SEDIMENTATION RATE: SED RATE: 25 mm/h (ref 0–30)

## 2017-10-10 NOTE — Patient Instructions (Addendum)
A few things to remember from today's visit:   Essential hypertension - Plan: Basic metabolic panel, Ambulatory referral to Cardiology  Left temporal headache - Plan: Sedimentation rate, C-reactive protein  Bradycardia, sinus  Today we are increasing dose of amlodipine and benazepril, so take 2 tablets daily. I would like to continue atenolol 12.5 mg daily due to low heart rate. Continue monitoring blood pressure at home. Cardiology referral was placed as requested. Continue low-salt diet.  Please be sure medication list is accurate. If a new problem present, please set up appointment sooner than planned today.

## 2017-10-11 ENCOUNTER — Telehealth: Payer: Self-pay | Admitting: Family Medicine

## 2017-10-11 NOTE — Telephone Encounter (Signed)
Copied from [place] #[id number]. Topic: Quick Communication - See Telephone Encounter >> Oct 11, 2017 11:12 AM Ahmed Prima L wrote: CRM for notification. See Telephone encounter for:   10/11/17.  fluticasone (FLONASE) 50 MCG/ACT nasal spray refill amLODipine-benazepril (LOTREL) 5-10 MG capsule (but said Dr was going to change it to 10-20mg ) [place] ([place]), [place] - [place]

## 2017-10-11 NOTE — Telephone Encounter (Signed)
Noted documentation from 1/2, plan to increase Amlodipine-Benazepril dose; will forward to the office.

## 2017-10-12 ENCOUNTER — Other Ambulatory Visit: Payer: Self-pay | Admitting: Family Medicine

## 2017-10-12 ENCOUNTER — Other Ambulatory Visit: Payer: Self-pay | Admitting: *Deleted

## 2017-10-12 MED ORDER — AMLODIPINE BESY-BENAZEPRIL HCL 10-20 MG PO CAPS: 1.0000 | ORAL_CAPSULE | Freq: Every day | ORAL | 1 refills | Status: DC

## 2017-10-12 MED ORDER — FLUTICASONE PROPIONATE 50 MCG/ACT NA SUSP: NASAL | 3 refills | Status: DC

## 2017-10-12 MED ORDER — FLUTICASONE PROPIONATE 50 MCG/ACT NA SUSP: NASAL | 2 refills | Status: DC

## 2017-10-12 NOTE — Telephone Encounter (Signed)
I told her to let me know of she did well with increasing dose. Since she still had Lotrel 5-10 mg I recommended taking 2 tabs daily.  Rx's for Flonase nasal spray and Lotrel 10-20 mg sent to her pharmacy.  Thanks, BJ

## 2017-10-16 ENCOUNTER — Encounter: Payer: Self-pay | Admitting: Cardiovascular Disease

## 2017-10-16 ENCOUNTER — Ambulatory Visit (INDEPENDENT_AMBULATORY_CARE_PROVIDER_SITE_OTHER): Payer: BC Managed Care – PPO | Admitting: Cardiovascular Disease

## 2017-10-16 VITALS — BP 128/82 | HR 62 | Ht 62.0 in | Wt 246.4 lb

## 2017-10-16 DIAGNOSIS — I10 Essential (primary) hypertension: Secondary | ICD-10-CM | POA: Diagnosis not present

## 2017-10-16 MED ORDER — ATENOLOL 25 MG PO TABS: 25.0000 mg | ORAL_TABLET | Freq: Every day | ORAL | 3 refills | Status: DC

## 2017-10-16 MED ORDER — POTASSIUM CHLORIDE ER 10 MEQ PO TBCR: 10.0000 meq | EXTENDED_RELEASE_TABLET | Freq: Every day | ORAL | 3 refills | Status: DC

## 2017-10-16 MED ORDER — AMLODIPINE BESYLATE 5 MG PO TABS: 5.0000 mg | ORAL_TABLET | Freq: Every day | ORAL | 3 refills | Status: DC

## 2017-10-16 NOTE — Progress Notes (Signed)
Cardiology Office Note:    Date:  10/16/2017   ID:  [name], DOB [birth year], MRN [id number]  PCP:  [name], [name], MD  Cardiologist:  Mertie Moores, MD    Referring MD: [name], [name], MD   Chief Complaint  Patient presents with  . Bradycardia   Problem list 1.  Sinus bradycardia 2.  Essential hypertension  History of Present Illness:    [name] is a 67 y.o. female with a hx of  HTN. She has been on Atenolol - dose has been gradually decreased due to sinus bradycardia .  She was started on Lotrel ( amlodipine - benazepril  10-20 mg a day )  The Lotrel has caused her legs to swell and has pain in her bladder when she urinates.   Has also developed chills since she has been taking the Lotrel .   Has never had any syncope.   Has occasional "swimmy headedness"  and orthostasis .   Family hx of MI and strokes in her family   She eats fast foods and prepared foods 3-4 times a week. Walks every other day  - 1/4 mile a day   She fell on Dec. 11 ( was pushing her mother up a wheelchair ramp and fell in the ice )     Past Medical History:  Diagnosis Date  . Abdominal pain, epigastric 12/28/2008   Centricity Description: ABDOMINAL PAIN, EPIGASTRIC Qualifier: Diagnosis of  By: [name], [name]   Centricity Description: ABDOMINAL PAIN-EPIGASTRIC Qualifier: Diagnosis of  By: [name], [name]   . Allergic rhinitis 09/16/2013  . Allergy   . Anemia   . Anxiety   . Anxiety disorder, unspecified 07/06/2017  . Arthritis   . Blood in stool   . BMI 45.0-49.9, adult ([place]) 07/26/2016  . Class 3 obesity with serious comorbidity and body mass index (BMI) of 45.0 to 49.9 in adult 03/07/2017  . DDD (degenerative disc disease), lumbar   . DIVERTICULOSIS-COLON 12/28/2008   Qualifier: Diagnosis of  By: [name], [name]   . GERD 12/28/2008   Qualifier: Diagnosis of  By: [name], [name]   . GERD (gastroesophageal reflux disease)   .  Headache   . Hyperglycemia 03/17/2014  . Hypertension   . Hypokalemia 07/26/2016  . IBS (irritable bowel syndrome)-C-D 07/26/2016  . Obesity   . Other and unspecified hyperlipidemia 03/17/2014  . PERSONAL HX COLONIC POLYPS 12/28/2008   Qualifier: Diagnosis of  By: [name], [name]   . Reflux esophagitis 12/28/2008   Qualifier: Diagnosis of  By: Julaine Hua CMA (AAMA), [name]    . S/P lumbar spinal fusion 07/02/2012    Past Surgical History:  Procedure Laterality Date  . ABDOMINAL HYSTERECTOMY    . COLONOSCOPY  2014  . RADIOLOGY WITH ANESTHESIA N/A 02/25/2015   Procedure: MRI OF LUMBAR SPINE;  Surgeon: Medication Radiologist, MD;  Location: [facility];  Service: Radiology;  Laterality: N/A;  DR. MAX COHEN/MRI  . SPINE SURGERY    . TONSILLECTOMY AND ADENOIDECTOMY      Current Medications: Current Meds  Medication Sig  . Aspirin-Caffeine 845-65 MG PACK Take 1-2 packets by mouth daily as needed (headache).  Marland Kitchen atenolol (TENORMIN) 25 MG tablet Take 1 tablet (25 mg total) by mouth daily.  Marland Kitchen azelastine (ASTELIN) 0.1 % nasal spray Place 2 sprays into both nostrils 2 (two) times daily. Use in each nostril as directed  . calcium carbonate (TUMS EX) 750 MG chewable tablet  Chew 2 tablets by mouth daily as needed for heartburn.  . cyclobenzaprine (FLEXERIL) 10 MG tablet Take 2.5 mg by mouth daily as needed (pain level 8 or greater).   Marland Kitchen dimenhyDRINATE (DRAMAMINE) 50 MG tablet Take 50 mg by mouth every 8 (eight) hours as needed for nausea. Reported on 01/12/2016  . diphenhydrAMINE (BENADRYL) 25 MG tablet Take 25 mg by mouth daily as needed.  . fluticasone (FLONASE) 50 MCG/ACT nasal spray USE TWO SPRAY (S) IN EACH NOSTRIL ONCE DAILY.  Marland Kitchen guaiFENesin (MUCINEX) 600 MG 12 hr tablet Take 300 mg by mouth daily.  . hydrochlorothiazide (HYDRODIURIL) 25 MG tablet TAKE ONE TABLET BY MOUTH ONCE DAILY  . HYDROcodone-acetaminophen (NORCO/VICODIN) 5-325 MG tablet Take 0.5 tablets by mouth daily as needed (pain level 8 or  greater).  . hydrocortisone 2.5 % ointment APPLY TWICE A DAY  . LORazepam (ATIVAN) 0.5 MG tablet Take 1 tablet (0.5 mg total) by mouth daily as needed for anxiety.  . montelukast (SINGULAIR) 4 MG chewable tablet Chew 1 tablet (4 mg total) by mouth at bedtime.  . Multiple Vitamins-Minerals (MULTI-VITAMIN GUMMIES PO) Take by mouth as needed.  Marland Kitchen omeprazole (PRILOSEC) 40 MG capsule Take 40 mg by mouth daily.  . Polyethylene Glycol 3350 (MIRALAX PO) Take by mouth.  . pravastatin (PRAVACHOL) 20 MG tablet Take 20 mg by mouth 3 (three) times daily.  . [DISCONTINUED] amLODipine-benazepril (LOTREL) 5-10 MG capsule Take 1 capsule by mouth 2 (two) times daily.  . [DISCONTINUED] atenolol (TENORMIN) 25 MG tablet Take by mouth daily.     Allergies:   Crestor [rosuvastatin]; Erythromycin; Lansoprazole; Penicillins; Potassium-containing compounds; Pravastatin; Robaxin [methocarbamol]; Sulfonamide derivatives; and Trental [pentoxifylline er]   Social History   Socioeconomic History  . Marital status: Widowed    Spouse name: None  . Number of children: None  . Years of education: None  . Highest education level: None  Social Needs  . Financial resource strain: None  . Food insecurity - worry: None  . Food insecurity - inability: None  . Transportation needs - medical: None  . Transportation needs - non-medical: None  Occupational History  . None  Tobacco Use  . Smoking status: Former Smoker    Packs/day: 1.00    Years: 38.00    Pack years: 38.00    Types: Cigarettes    Last attempt to quit: 10/28/1997    Years since quitting: 19.9  . Smokeless tobacco: Never Used  Substance and Sexual Activity  . Alcohol use: No  . Drug use: No  . Sexual activity: No  Other Topics Concern  . 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     Family History: The patient's family history includes Arthritis in her father; Breast cancer in her cousin and paternal aunt; Cancer in  her maternal grandfather; Cervical cancer in her sister; Diabetes in her sister; Heart disease in her sister; Hyperlipidemia in her father, mother, and sister; Hypertension in her father, mother, and paternal grandmother; Pancreatic cancer in her maternal aunt. ROS:   Please see the history of present illness.     All other systems reviewed and are negative.  EKGs/Labs/Other Studies Reviewed:    The following studies were reviewed today:   EKG:  EKG is  ordered today.  The ekg ordered today demonstrates    Recent Labs: 09/07/2017: TSH 2.47 09/21/2017: ALT 18 10/10/2017: BUN 21; Creatinine, Ser 0.84; Potassium 3.9; Sodium 140  Recent Lipid Panel    Component Value  Date/Time   CHOL 170 09/07/2017 0942   TRIG 93.0 09/07/2017 0942   HDL 51.90 09/07/2017 0942   CHOLHDL 3 09/07/2017 0942   VLDL 18.6 09/07/2017 0942   LDLCALC 100 (H) 09/07/2017 0942    Physical Exam:    VS:  BP 128/82   Pulse 62   Ht 5\' 2"  (1.575 m)   Wt 246 lb 6.4 oz (111.8 kg)   SpO2 97%   BMI 45.07 kg/m     Wt Readings from Last 3 Encounters:  10/16/17 246 lb 6.4 oz (111.8 kg)  10/10/17 248 lb (112.5 kg)  09/24/17 248 lb 6 oz (112.7 kg)     GEN:   Obese , middle aged female  HEENT: Normal NECK: No JVD; No carotid bruits LYMPHATICS: No lymphadenopathy CARDIAC:  RR , normal S1S 2  RESPIRATORY:  Clear to auscultation without rales, wheezing or rhonchi  ABDOMEN: Soft, non-tender, non-distended MUSCULOSKELETAL:  No edema; No deformity  SKIN: Warm and dry NEUROLOGIC:  Alert and oriented x 3 PSYCHIATRIC:  Normal affect   ASSESSMENT:    1. Essential hypertension    PLAN:    In order of problems listed above:  1. Sinus bradycardia: Patient took atenolol 25 mg today and her heart rate is 62.  I think that she will tolerate atenolol 25 mg a day.  We may need to substitute carvedilol for atenolol during 1 of her next visits.  2.  Essential hypertension: She is on multiple medications and has multiple  intolerances.  She has had some leg swelling that I think is due to the high-dose amlodipine.  She also complains of pain and burning when she urinates.  I am not sure which medicine is causing this but it could be the benazepril or perhaps the HCTZ.  She is been on HCTZ for quite some time. We will discontinue the Lotrel. We will decrease her amlodipine to 5 mg a day.  We will add potassium chloride 10 mg a day to her medical regimen.     It is clear that she eats a very high salt diet.  She eats out at least 3 times a week and eat saltine crackers periodically through the day.  She also has been eating lots of pretzels.  She recently has decreased her pretzel intake.  She will work on her salt intake.  We will see her back for nurse visit in 2-3 weeks for blood pressure check as  well as a basic metabolic profile.   Medication Adjustments/Labs and Tests Ordered: Current medicines are reviewed at length with the patient today.  Concerns regarding medicines are outlined above.  Orders Placed This Encounter  Procedures  . Basic Metabolic Panel (BMET)   Meds ordered this encounter  Medications  . amLODipine (NORVASC) 5 MG tablet    Sig: Take 1 tablet (5 mg total) by mouth daily.    Dispense:  180 tablet    Refill:  3  . potassium chloride (K-DUR) 10 MEQ tablet    Sig: Take 1 tablet (10 mEq total) by mouth daily.    Dispense:  90 tablet    Refill:  3  . atenolol (TENORMIN) 25 MG tablet    Sig: Take 1 tablet (25 mg total) by mouth daily.    Dispense:  90 tablet    Refill:  3    Signed, Mertie Moores, MD  10/16/2017 2:10 PM    [facility]

## 2017-10-16 NOTE — Patient Instructions (Addendum)
Medication Instructions:  Your physician has recommended you make the following change in your medication:  STOP Lotrel START Amlodipine 5 mg once daily START Kdur 10 meq once daily INCREASE Atenolol to 25 mg once daily   Labwork: Your physician recommends that you return for lab work in: 2-3 weeks for basic metabolic panel on same day as your nurse visit   Testing/Procedures: None Ordered   Follow-Up: Your physician recommends that you return for a follow-up appointment on Wednesday January 23 at 2:00 pm for Nurse Visit/BP check with Sharyn Lull, RN  Your physician recommends that you return for a follow-up appointment in: 6-8 weeks with Dr. Acie Fredrickson on Wed. February 27 at 10:40 am    If you need a refill on your cardiac medications before your next appointment, please call your pharmacy.   Thank you for choosing [facility]! Christen Bame, RN [phone]

## 2017-10-24 ENCOUNTER — Ambulatory Visit: Payer: Self-pay | Admitting: Family Medicine

## 2017-10-31 ENCOUNTER — Other Ambulatory Visit: Payer: BC Managed Care – PPO | Admitting: *Deleted

## 2017-10-31 ENCOUNTER — Ambulatory Visit (INDEPENDENT_AMBULATORY_CARE_PROVIDER_SITE_OTHER): Payer: BC Managed Care – PPO | Admitting: Nurse Practitioner

## 2017-10-31 VITALS — BP 140/76 | HR 60 | Ht 62.0 in | Wt 243.5 lb

## 2017-10-31 DIAGNOSIS — I10 Essential (primary) hypertension: Secondary | ICD-10-CM

## 2017-10-31 NOTE — Progress Notes (Signed)
1) Reason for visit: BP check; recent medication change at last ov on 10/16/17  Name of MD requesting visit: Dr. Acie Fredrickson  H&P: Patient presents in NAD without complaints. She states BP   ROS related to problem: BP today 140/76 mmHg, pulse 60 bpm.      Patient denies complaints, states she has had high BP on a          few occasions and takes an additional atenolol 25 mg. I      advised her not to do this frequently due to her low pulse rate. She states she has taken this additional dose approximately 4 times for BP > 200/90. She believes she might have had a sinus infection during this time.  She states she is aware to monitor for bradycardia and/or dizziness or hypotension  Assessment and plan per MD: Continue current medications and keep f/u visit on 2/27

## 2017-10-31 NOTE — Patient Instructions (Signed)
Medication Instructions:  Your physician recommends that you continue on your current medications as directed. Please refer to the Current Medication list given to you today.   Labwork: Done Today   Testing/Procedures: None Ordered   Follow-Up: Your physician recommends that you keep your follow-up appointment on February 27 with Dr. Acie Fredrickson   If you need a refill on your cardiac medications before your next appointment, please call your pharmacy.   Thank you for choosing [facility]! Christen Bame, RN [phone]

## 2017-11-01 ENCOUNTER — Telehealth: Payer: Self-pay | Admitting: Nurse Practitioner

## 2017-11-01 LAB — BASIC METABOLIC PANEL
BUN/Creatinine Ratio: 18 (ref 12–28)
BUN: 17 mg/dL (ref 8–27)
CALCIUM: 10.3 mg/dL (ref 8.7–10.3)
CHLORIDE: 99 mmol/L (ref 96–106)
CO2: 27 mmol/L (ref 20–29)
Creatinine, Ser: 0.96 mg/dL (ref 0.57–1.00)
GFR calc non Af Amer: 63 mL/min/{1.73_m2} (ref >59)
GFR, EST AFRICAN AMERICAN: 72 mL/min/{1.73_m2} (ref >59)
GLUCOSE: 103 mg/dL — AB (ref 65–99)
POTASSIUM: 3.7 mmol/L (ref 3.5–5.2)
Sodium: 142 mmol/L (ref 134–144)

## 2017-11-01 MED ORDER — POTASSIUM CHLORIDE ER 10 MEQ PO TBCR: 20.0000 meq | EXTENDED_RELEASE_TABLET | Freq: Every day | ORAL | 3 refills | Status: DC

## 2017-11-01 NOTE — Telephone Encounter (Signed)
-----   Message from Thayer Headings, MD sent at 11/01/2017  3:52 PM EST ----- Glucose is mildly elevated.     potassium remains slightly low. Let us increase potassium chloride to 20 mEq a day and recheck in 1 month.  Otherwise the basic metabolic profile is stable.

## 2017-11-01 NOTE — Telephone Encounter (Signed)
Reviewed results and plan of care with patient who verbalized understanding. She agrees to increase Kdur to 20 meq daily. She has an appointment with Dr. Acie Fredrickson on 2/27 and is aware bmet will be rechecked at that time. She thanked me for the call.

## 2017-11-07 ENCOUNTER — Ambulatory Visit: Payer: Self-pay | Admitting: Family Medicine

## 2017-11-28 ENCOUNTER — Encounter: Payer: Self-pay | Admitting: Cardiovascular Disease

## 2017-12-05 ENCOUNTER — Encounter: Payer: Self-pay | Admitting: Cardiovascular Disease

## 2017-12-05 ENCOUNTER — Ambulatory Visit (INDEPENDENT_AMBULATORY_CARE_PROVIDER_SITE_OTHER): Payer: BC Managed Care – PPO | Admitting: Cardiovascular Disease

## 2017-12-05 VITALS — BP 136/80 | HR 59 | Ht 62.0 in | Wt 244.1 lb

## 2017-12-05 DIAGNOSIS — I10 Essential (primary) hypertension: Secondary | ICD-10-CM | POA: Diagnosis not present

## 2017-12-05 LAB — BASIC METABOLIC PANEL
BUN/Creatinine Ratio: 20 (ref 12–28)
BUN: 20 mg/dL (ref 8–27)
CALCIUM: 10.1 mg/dL (ref 8.7–10.3)
CO2: 24 mmol/L (ref 20–29)
Chloride: 101 mmol/L (ref 96–106)
Creatinine, Ser: 0.98 mg/dL (ref 0.57–1.00)
GFR calc Af Amer: 71 mL/min/{1.73_m2} (ref >59)
GFR calc non Af Amer: 61 mL/min/{1.73_m2} (ref >59)
GLUCOSE: 104 mg/dL — AB (ref 65–99)
POTASSIUM: 4.2 mmol/L (ref 3.5–5.2)
SODIUM: 143 mmol/L (ref 134–144)

## 2017-12-05 NOTE — Patient Instructions (Signed)
Medication Instructions:  Your physician recommends that you continue on your current medications as directed. Please refer to the Current Medication list given to you today.   Labwork: TODAY - basic metabolic panel   Testing/Procedures: None Ordered   Follow-Up: Your physician recommends that you schedule a follow-up appointment in: as needed with Dr. Acie Fredrickson   If you need a refill on your cardiac medications before your next appointment, please call your pharmacy.   Thank you for choosing [facility]! Christen Bame, RN [phone]

## 2017-12-05 NOTE — Progress Notes (Signed)
Cardiology Office Note:    Date:  12/05/2017   ID:  [name], [name] [birth year], MRN [id number]  PCP:  Herring, Megan G, MD  Cardiologist:  Megan Moores, MD    Referring MD: Herring, Megan G, MD   Chief Complaint  Patient presents with  . Hypertension   Problem list 1.  Sinus bradycardia 2.  Essential hypertension  10/16/17    [name] is a 67 y.o. female with a hx of  HTN. She has been on Atenolol - dose has been gradually decreased due to sinus bradycardia .  She was started on Lotrel ( amlodipine - benazepril  10-20 mg a day )  The Lotrel has caused her legs to swell and has pain in her bladder when she urinates.   Has also developed chills since she has been taking the Lotrel .   Has never had any syncope.   Has occasional "swimmy headedness"  and orthostasis .   Family hx of MI and strokes in her family   She eats fast foods and prepared foods 3-4 times a week. Walks every other day  - 1/4 mile a day   She fell on Dec. 11 ( was pushing her mother up a wheelchair ramp and fell in the ice )   December 05, 2017: [name] was seen today for follow-up of her high blood pressure.  Her blood pressure has been much better only. Is eating better,  Is sweating lots.    Was told to drink Gatorade several times a week.  Wt is 244 .   Has lost 9 lbs   Has not working out regularly  Has had some ortho  issues  Is eating out much less.    Past Medical History:  Diagnosis Date  . Abdominal pain, epigastric 12/28/2008   Centricity Description: ABDOMINAL PAIN, EPIGASTRIC Qualifier: Diagnosis of  By: [name], [name]   Centricity Description: ABDOMINAL PAIN-EPIGASTRIC Qualifier: Diagnosis of  By: [name], [name]   . Allergic rhinitis 09/16/2013  . Allergy   . Anemia   . Anxiety   . Anxiety disorder, unspecified 07/06/2017  . Arthritis   . Blood in stool   . BMI 45.0-49.9, adult ([place]) 07/26/2016  . Class 3 obesity with serious  comorbidity and body mass index (BMI) of 45.0 to 49.9 in adult 03/07/2017  . DDD (degenerative disc disease), lumbar   . DIVERTICULOSIS-COLON 12/28/2008   Qualifier: Diagnosis of  By: [name], [name]   . GERD 12/28/2008   Qualifier: Diagnosis of  By: [name], [name]   . GERD (gastroesophageal reflux disease)   . Headache   . Hyperglycemia 03/17/2014  . Hypertension   . Hypokalemia 07/26/2016  . IBS (irritable bowel syndrome)-C-D 07/26/2016  . Obesity   . Other and unspecified hyperlipidemia 03/17/2014  . PERSONAL HX COLONIC POLYPS 12/28/2008   Qualifier: Diagnosis of  By: [name], [name]   . Reflux esophagitis 12/28/2008   Qualifier: Diagnosis of  By: Julaine Hua CMA (AAMA), [name]    . S/P lumbar spinal fusion 07/02/2012    Past Surgical History:  Procedure Laterality Date  . ABDOMINAL HYSTERECTOMY    . COLONOSCOPY  2014  . RADIOLOGY WITH ANESTHESIA N/A 02/25/2015   Procedure: MRI OF LUMBAR SPINE;  Surgeon: Medication Radiologist, MD;  Location: [place];  Service: Radiology;  Laterality: N/A;  DR. MAX COHEN/MRI  . SPINE SURGERY    . TONSILLECTOMY AND ADENOIDECTOMY  Current Medications: Current Meds  Medication Sig  . amLODipine (NORVASC) 5 MG tablet Take 1 tablet (5 mg total) by mouth daily.  . Aspirin-Caffeine 845-65 MG PACK Take 1-2 packets by mouth daily as needed (headache).  Marland Kitchen atenolol (TENORMIN) 25 MG tablet Take 1 tablet (25 mg total) by mouth daily.  Marland Kitchen azelastine (ASTELIN) 0.1 % nasal spray Place 2 sprays into both nostrils 2 (two) times daily. Use in each nostril as directed  . calcium carbonate (TUMS EX) 750 MG chewable tablet Chew 2 tablets by mouth daily as needed for heartburn.  . cyclobenzaprine (FLEXERIL) 10 MG tablet Take 2.5 mg by mouth daily as needed (pain level 8 or greater).   Marland Kitchen dimenhyDRINATE (DRAMAMINE) 50 MG tablet Take 50 mg by mouth every 8 (eight) hours as needed for nausea. Reported on 01/12/2016  . diphenhydrAMINE (BENADRYL) 25 MG tablet Take  25 mg by mouth daily as needed.  . fexofenadine (ALLEGRA) 60 MG tablet Take 60 mg by mouth daily.  . fluticasone (FLONASE) 50 MCG/ACT nasal spray USE TWO SPRAY (S) IN EACH NOSTRIL ONCE DAILY.  Marland Kitchen guaiFENesin (MUCINEX) 600 MG 12 hr tablet Take 300 mg by mouth daily.  . hydrochlorothiazide (HYDRODIURIL) 25 MG tablet TAKE ONE TABLET BY MOUTH ONCE DAILY  . HYDROcodone-acetaminophen (NORCO/VICODIN) 5-325 MG tablet Take 0.5 tablets by mouth daily as needed (pain level 8 or greater).  . hydrocortisone 2.5 % ointment APPLY TWICE A DAY  . LORazepam (ATIVAN) 0.5 MG tablet Take 1 tablet (0.5 mg total) by mouth daily as needed for anxiety.  . Multiple Vitamins-Minerals (MULTI-VITAMIN GUMMIES PO) Take by mouth as needed.  Marland Kitchen omeprazole (PRILOSEC) 40 MG capsule Take 40 mg by mouth daily.  . Polyethylene Glycol 3350 (MIRALAX PO) Take by mouth.  . potassium chloride (K-DUR) 10 MEQ tablet Take 2 tablets (20 mEq total) by mouth daily.  . pravastatin (PRAVACHOL) 20 MG tablet Take 20 mg by mouth 3 (three) times daily.     Allergies:   Crestor [rosuvastatin]; Erythromycin; Lansoprazole; Penicillins; Potassium-containing compounds; Pravastatin; Robaxin [methocarbamol]; Sulfonamide derivatives; and Trental [pentoxifylline er]   Social History   Socioeconomic History  . Marital status: Widowed    Spouse name: None  . Number of children: None  . Years of education: None  . Highest education level: None  Social Needs  . Financial resource strain: None  . Food insecurity - worry: None  . Food insecurity - inability: None  . Transportation needs - medical: None  . Transportation needs - non-medical: None  Occupational History  . None  Tobacco Use  . Smoking status: Former Smoker    Packs/day: 1.00    Years: 38.00    Pack years: 38.00    Types: Cigarettes    Last attempt to quit: 10/28/1997    Years since quitting: 20.1  . Smokeless tobacco: Never Used  Substance and Sexual Activity  . Alcohol use: No  .  Drug use: No  . Sexual activity: No  Other Topics Concern  . 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     Family History: The patient's family history includes Arthritis in her father; Breast cancer in her cousin and paternal aunt; Cancer in her maternal grandfather; Cervical cancer in her sister; Diabetes in her sister; Heart disease in her sister; Hyperlipidemia in her father, mother, and sister; Hypertension in her father, mother, and paternal grandmother; Pancreatic cancer in her maternal aunt.  ROS:   As per current hx.  Otherwise negatve    EKGs/Labs/Other Studies Reviewed:    The following studies were reviewed today:   EKG   Recent Labs: 09/07/2017: TSH 2.47 09/21/2017: ALT 18 10/31/2017: BUN 17; Creatinine, Ser 0.96; Potassium 3.7; Sodium 142  Recent Lipid Panel    Component Value Date/Time   CHOL 170 09/07/2017 0942   TRIG 93.0 09/07/2017 0942   HDL 51.90 09/07/2017 0942   CHOLHDL 3 09/07/2017 0942   VLDL 18.6 09/07/2017 0942   LDLCALC 100 (H) 09/07/2017 0942   Physical Exam: Blood pressure 136/80, pulse (!) 59, height 5\' 2"  (1.575 m), weight 244 lb 1.9 oz (110.7 kg), SpO2 95 %.  GEN:  Well nourished, well developed in no acute distress HEENT: Normal NECK: No JVD; No carotid bruits LYMPHATICS: No lymphadenopathy CARDIAC: RR, no murmurs, rubs, gallops RESPIRATORY:  Clear to auscultation without rales, wheezing or rhonchi  ABDOMEN: Soft, non-tender, non-distended MUSCULOSKELETAL:  No edema; No deformity  SKIN: Warm and dry NEUROLOGIC:  Alert and oriented x 3   ASSESSMENT:    1. Essential hypertension    PLAN:    1. Sinus bradycardia:  Stable .  Continue atenolol  .  2.  Essential hypertension:   BP is better.  She is lost about 9 pounds since I last saw her.  She is eating out a lot less. He is been drinking Gatorade once or twice a week at the recommendation of a previous doctor.  I advised her to stop drinking  Gatorade as it has way too much sugar.  She does not need the extra salt.  We added HCTZ and potassium.  Her potassium level  was  still a little low and we increased the K-Dur to 10 BID r.  We will check that level today.  Blood pressure is well controlled.  Will have her follow-up with her general medical doctor and we will see her on an as-needed basis.  Follow up PRN     Medication Adjustments/Labs and Tests Ordered: Current medicines are reviewed at length with the patient today.  Concerns regarding medicines are outlined above.  Orders Placed This Encounter  Procedures  . Basic Metabolic Panel (BMET)   No orders of the defined types were placed in this encounter.   Signed, Megan Moores, MD  12/05/2017 11:19 AM    [place]

## 2018-01-10 NOTE — Progress Notes (Signed)
HPI:   [name] is a 67 y.o. female, who is here today for 3 months follow up.   She was last seen on 10/10/17.  Since her last OV she has followed with Dr Acie Fredrickson for HTN.Last seen on 12/05/17. Home BP readings: "Good."  Bradycardia: She is checking HR at home, and average 80s in the low 60s. Currently she is on atenolol 25 mg daily and HCTZ 25 mg daily.  HypoK+, currently on K-DUR 10 meq bid, dose was adjusted a few weeks ago.   Lab Results  Component Value Date   CREATININE 0.98 12/05/2017   BUN 20 12/05/2017   NA 143 12/05/2017   K 4.2 12/05/2017   CL 101 12/05/2017   CO2 24 12/05/2017   Anxiety: Currently she is on Lorazepam 0.5 mg daily prn. Problem is exacerbated by her mother medical problems.   She is tolerating lorazepam well, it helps her with an acute anxiety.  She denies depressed mood or suicidal thoughts.   She has lost weight since her last visit. She has decreased salt intake, she is not eating out as often as she did, she is cooking more. She is working 1 to twice per week. She also attributes weight loss to the fact that she has not been on on for the past couple months.   She fell again 5 days ago when she was trying to prevent her mother from falling. She felt on left side and aggravated hip pain.  pain is improving, aggravated by movement, prolonged standing, prolonged walking. She is currently following with orthopedist.   Review of Systems  Constitutional: Negative for activity change, appetite change, fatigue and fever.  HENT: Negative for mouth sores, nosebleeds and trouble swallowing.   Eyes: Negative for redness and visual disturbance.  Respiratory: Negative for cough, shortness of breath and wheezing.   Cardiovascular: Negative for chest pain, palpitations and leg swelling.  Gastrointestinal: Negative for abdominal pain, nausea and vomiting.       Negative for changes in bowel habits.  Genitourinary: Negative for  decreased urine volume, dysuria and hematuria.  Musculoskeletal: Positive for arthralgias and back pain.  Skin: Negative for rash.  Neurological: Negative for syncope, weakness and headaches.  Psychiatric/Behavioral: Negative for confusion. The patient is nervous/anxious.     Current Outpatient Medications on File Prior to Visit  Medication Sig Dispense Refill  . amLODipine (NORVASC) 5 MG tablet Take 1 tablet (5 mg total) by mouth daily. 180 tablet 3  . Aspirin-Caffeine 845-65 MG PACK Take 1-2 packets by mouth daily as needed (headache).    Marland Kitchen atenolol (TENORMIN) 25 MG tablet Take 1 tablet (25 mg total) by mouth daily. 90 tablet 3  . azelastine (ASTELIN) 0.1 % nasal spray Place 2 sprays into both nostrils 2 (two) times daily. Use in each nostril as directed 30 mL 11  . calcium carbonate (TUMS EX) 750 MG chewable tablet Chew 2 tablets by mouth daily as needed for heartburn.    . cyclobenzaprine (FLEXERIL) 10 MG tablet Take 2.5 mg by mouth daily as needed (pain level 8 or greater).     Marland Kitchen dimenhyDRINATE (DRAMAMINE) 50 MG tablet Take 50 mg by mouth every 8 (eight) hours as needed for nausea. Reported on 01/12/2016    . diphenhydrAMINE (BENADRYL) 25 MG tablet Take 25 mg by mouth daily as needed.    . fexofenadine (ALLEGRA) 60 MG tablet Take 60 mg by mouth daily.    . fluticasone (FLONASE) 50  MCG/ACT nasal spray USE TWO SPRAY (S) IN EACH NOSTRIL ONCE DAILY. 48 g 3  . guaiFENesin (MUCINEX) 600 MG 12 hr tablet Take 300 mg by mouth daily.    . hydrochlorothiazide (HYDRODIURIL) 25 MG tablet TAKE ONE TABLET BY MOUTH ONCE DAILY 90 tablet 1  . HYDROcodone-acetaminophen (NORCO/VICODIN) 5-325 MG tablet Take 0.5 tablets by mouth daily as needed (pain level 8 or greater). 30 tablet 0  . hydrocortisone 2.5 % ointment APPLY TWICE A DAY 60 g 11  . LORazepam (ATIVAN) 0.5 MG tablet Take 1 tablet (0.5 mg total) by mouth daily as needed for anxiety. 20 tablet 1  . Multiple Vitamins-Minerals (MULTI-VITAMIN GUMMIES PO)  Take by mouth as needed.    Marland Kitchen omeprazole (PRILOSEC) 40 MG capsule Take 40 mg by mouth daily.    . Polyethylene Glycol 3350 (MIRALAX PO) Take by mouth.    . potassium chloride (K-DUR) 10 MEQ tablet Take 2 tablets (20 mEq total) by mouth daily. 180 tablet 3  . pravastatin (PRAVACHOL) 20 MG tablet Take 20 mg by mouth 3 (three) times daily.     No current facility-administered medications on file prior to visit.      Past Medical History:  Diagnosis Date  . Abdominal pain, epigastric 12/28/2008   Centricity Description: ABDOMINAL PAIN, EPIGASTRIC Qualifier: Diagnosis of  By: [name], [name]   Centricity Description: ABDOMINAL PAIN-EPIGASTRIC Qualifier: Diagnosis of  By: [name], [name]   . Allergic rhinitis 09/16/2013  . Allergy   . Anemia   . Anxiety   . Anxiety disorder, unspecified 07/06/2017  . Arthritis   . Blood in stool   . BMI 45.0-49.9, adult ([place]) 07/26/2016  . Class 3 obesity with serious comorbidity and body mass index (BMI) of 45.0 to 49.9 in adult 03/07/2017  . DDD (degenerative disc disease), lumbar   . DIVERTICULOSIS-COLON 12/28/2008   Qualifier: Diagnosis of  By: [name], [name]   . GERD 12/28/2008   Qualifier: Diagnosis of  By: [name], [name]   . GERD (gastroesophageal reflux disease)   . Headache   . Hyperglycemia 03/17/2014  . Hypertension   . Hypokalemia 07/26/2016  . IBS (irritable bowel syndrome)-C-D 07/26/2016  . Obesity   . Other and unspecified hyperlipidemia 03/17/2014  . PERSONAL HX COLONIC POLYPS 12/28/2008   Qualifier: Diagnosis of  By: [name], [name]   . Reflux esophagitis 12/28/2008   Qualifier: Diagnosis of  By: Julaine Hua CMA (AAMA), [name]    . S/P lumbar spinal fusion 07/02/2012   Allergies  Allergen Reactions  . Crestor [Rosuvastatin]   . Erythromycin     rash  . Lansoprazole   . Penicillins     rash  . Potassium-Containing Compounds Other (See Comments)    Stomach upset   . Pravastatin   . Robaxin  [Methocarbamol]     Messed up stomach  . Sulfonamide Derivatives     rash  . Trental [Pentoxifylline Er]     Bad headache    Social History   Socioeconomic History  . Marital status: Widowed    Spouse name: Not on file  . Number of children: Not on file  . Years of education: Not on file  . Highest education level: Not on file  Occupational History  . Not on file  Social Needs  . Financial resource strain: Not on file  . Food insecurity:    Worry: Not on file    Inability: Not on file  . Transportation needs:  Medical: Not on file    Non-medical: Not on file  Tobacco Use  . Smoking status: Former Smoker    Packs/day: 1.00    Years: 38.00    Pack years: 38.00    Types: Cigarettes    Last attempt to quit: 10/28/1997    Years since quitting: 20.2  . Smokeless tobacco: Never Used  Substance and Sexual Activity  . Alcohol use: No  . Drug use: No  . Sexual activity: Never  Lifestyle  . Physical activity:    Days per week: Not on file    Minutes per session: Not on file  . Stress: Not on file  Relationships  . Social connections:    Talks on phone: Not on file    Gets together: Not on file    Attends religious service: Not on file    Active member of club or organization: Not on file    Attends meetings of clubs or organizations: Not on file    Relationship status: Not on file  Other Topics Concern  . 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    Vitals:   01/11/18 0836  BP: 130/84  Pulse: 69  Resp: 12  Temp: 97.9 F (36.6 C)  SpO2: 98%   Body mass index is 43.99 kg/m.  Wt Readings from Last 3 Encounters:  01/11/18 240 lb 8 oz (109.1 kg)  12/05/17 244 lb 1.9 oz (110.7 kg)  10/31/17 243 lb 8 oz (110.5 kg)    Physical Exam  Nursing note and vitals reviewed. Constitutional: She is oriented to person, place, and time. She appears well-developed. No distress.  HENT:  Head: Normocephalic and atraumatic.    Mouth/Throat: Oropharynx is clear and moist and mucous membranes are normal.  Eyes: Pupils are equal, round, and reactive to light. Conjunctivae are normal.  Cardiovascular: Normal rate and regular rhythm.  Occasional extrasystoles (X 1) are present.  No murmur heard. Pulses:      Dorsalis pedis pulses are 2+ on the right side, and 2+ on the left side.  By my count 60/min  Respiratory: Effort normal and breath sounds normal. No respiratory distress.  GI: Soft. She exhibits no mass. There is no hepatomegaly. There is no tenderness.  Musculoskeletal: She exhibits no edema.  Lymphadenopathy:    She has no cervical adenopathy.  Neurological: She is alert and oriented to person, place, and time. She has normal strength. Coordination normal.  Antalgic gait, not assisted.  Skin: Skin is warm. No erythema.  Psychiatric: She has a normal mood and affect.  Well groomed, good eye contact.     ASSESSMENT AND PLAN:   Ms. Charlsey Moragne was seen today for 3 months follow-up.  Orders Placed This Encounter  Procedures  . Basic metabolic panel   Lab Results  Component Value Date   CREATININE 0.87 01/11/2018   BUN 18 01/11/2018   NA 142 01/11/2018   K 3.6 01/11/2018   CL 102 01/11/2018   CO2 30 01/11/2018    Essential hypertension Adequately controlled. No changes in current management. DASH-low salt diet to continue. Eye exam current. F/U in 6 months, before if needed.    Anxiety disorder, unspecified Stable. No changes in Ativan 0.5 mg daily as needed. It seems to be aggravated by her caregiver role, she would benefit from having a aid to take care of her mom once per week. Some side effects discussed.  F/U in 4 monthds  Bradycardia, sinus We will continue monitoring. Instructed to check HR at home. Since she has not tolerated other antihypertensive meds,we will continue Atenolol. Instructed about warning signs. F/U in 4 months.  Hypokalemia  Some side  effects of HCTZ discussed. Continue current KDUR supplementation. Further recommendation will be given according to lab results.     -Ms. Delainy Mcelhiney was advised to return sooner than planned today if new concerns arise.       Kaulana Brindle G. [name], MD  [facility]. [place] office.

## 2018-01-11 ENCOUNTER — Encounter: Payer: Self-pay | Admitting: Family Medicine

## 2018-01-11 ENCOUNTER — Ambulatory Visit (INDEPENDENT_AMBULATORY_CARE_PROVIDER_SITE_OTHER): Payer: BC Managed Care – PPO | Admitting: Family Medicine

## 2018-01-11 VITALS — BP 130/84 | HR 69 | Temp 97.9°F | Resp 12 | Ht 62.0 in | Wt 240.5 lb

## 2018-01-11 DIAGNOSIS — Z6841 Body Mass Index (BMI) 40.0 and over, adult: Secondary | ICD-10-CM

## 2018-01-11 DIAGNOSIS — E876 Hypokalemia: Secondary | ICD-10-CM | POA: Diagnosis not present

## 2018-01-11 DIAGNOSIS — R001 Bradycardia, unspecified: Secondary | ICD-10-CM | POA: Diagnosis not present

## 2018-01-11 DIAGNOSIS — I10 Essential (primary) hypertension: Secondary | ICD-10-CM

## 2018-01-11 DIAGNOSIS — F419 Anxiety disorder, unspecified: Secondary | ICD-10-CM

## 2018-01-11 LAB — BASIC METABOLIC PANEL
BUN: 18 mg/dL (ref 6–23)
CALCIUM: 10.1 mg/dL (ref 8.4–10.5)
CO2: 30 mEq/L (ref 19–32)
Chloride: 102 mEq/L (ref 96–112)
Creatinine, Ser: 0.87 mg/dL (ref 0.40–1.20)
GFR: 84.18 mL/min (ref >60.00)
GLUCOSE: 106 mg/dL — AB (ref 70–99)
Potassium: 3.6 mEq/L (ref 3.5–5.1)
Sodium: 142 mEq/L (ref 135–145)

## 2018-01-11 NOTE — Assessment & Plan Note (Addendum)
Adequately controlled. No changes in current management. DASH-low salt diet to continue. Eye exam current. F/U in 6 months, before if needed.

## 2018-01-11 NOTE — Assessment & Plan Note (Signed)
We will continue monitoring. Instructed to check HR at home. Since she has not tolerated other antihypertensive meds,we will continue Atenolol. Instructed about warning signs. F/U in 4 months.

## 2018-01-11 NOTE — Patient Instructions (Signed)
A few things to remember from today's visit:   BMI 45.0-49.9, adult ([place])  Essential hypertension - Plan: Basic metabolic panel  Anxiety disorder, unspecified type  Bradycardia, sinus  No changes today. Fall precautions.  Please be sure medication list is accurate. If a new problem present, please set up appointment sooner than planned today.

## 2018-01-11 NOTE — Assessment & Plan Note (Addendum)
Stable. No changes in Ativan 0.5 mg daily as needed. It seems to be aggravated by her caregiver role, she would benefit from having a aid to take care of her mom once per week. Some side effects discussed.  F/U in 4 monthds

## 2018-01-17 ENCOUNTER — Ambulatory Visit (INDEPENDENT_AMBULATORY_CARE_PROVIDER_SITE_OTHER): Payer: BC Managed Care – PPO | Admitting: Family Medicine

## 2018-01-17 ENCOUNTER — Encounter: Payer: Self-pay | Admitting: Family Medicine

## 2018-01-17 VITALS — BP 124/70 | HR 56 | Temp 97.6°F | Wt 240.0 lb

## 2018-01-17 DIAGNOSIS — B86 Scabies: Secondary | ICD-10-CM | POA: Diagnosis not present

## 2018-01-17 MED ORDER — HYDROXYZINE HCL 25 MG PO TABS: 25.0000 mg | ORAL_TABLET | Freq: Two times a day (BID) | ORAL | 0 refills | Status: DC | PRN

## 2018-01-17 MED ORDER — PERMETHRIN 5 % EX CREA: TOPICAL_CREAM | CUTANEOUS | 1 refills | Status: DC

## 2018-01-17 NOTE — Progress Notes (Signed)
Subjective:    Patient ID: [name], female    DOB: [birth year], 67 y.o.   MRN: [id number]  No chief complaint on file.   HPI Patient was seen today for acute concern.  Pt endorses pruritic rash on forearms days.  Pt states she has tried bleach and rubbing alcohol on her arms which temporarily relieved the itching.  Pt denies recent sick contacts, but does say he takes care of her elderly mother at home.  Pt recalls a few days ago while cleaning her mother after a BM, some of the feces got on pt's b/l forearms.  Pt feels like she is spreading the rash every time she scratches.  Pt's thinks her mother may have similar lesions on her LEs.  Past Medical History:  Diagnosis Date  . Abdominal pain, epigastric 12/28/2008   Centricity Description: ABDOMINAL PAIN, EPIGASTRIC Qualifier: Diagnosis of  By: [name], [name]   Centricity Description: ABDOMINAL PAIN-EPIGASTRIC Qualifier: Diagnosis of  By: [name], [name]   . Allergic rhinitis 09/16/2013  . Allergy   . Anemia   . Anxiety   . Anxiety disorder, unspecified 07/06/2017  . Arthritis   . Blood in stool   . BMI 45.0-49.9, adult ([place]) 07/26/2016  . Class 3 obesity with serious comorbidity and body mass index (BMI) of 45.0 to 49.9 in adult 03/07/2017  . DDD (degenerative disc disease), lumbar   . DIVERTICULOSIS-COLON 12/28/2008   Qualifier: Diagnosis of  By: [name], [name]   . GERD 12/28/2008   Qualifier: Diagnosis of  By: [name], [name]   . GERD (gastroesophageal reflux disease)   . Headache   . Hyperglycemia 03/17/2014  . Hypertension   . Hypokalemia 07/26/2016  . IBS (irritable bowel syndrome)-C-D 07/26/2016  . Obesity   . Other and unspecified hyperlipidemia 03/17/2014  . PERSONAL HX COLONIC POLYPS 12/28/2008   Qualifier: Diagnosis of  By: [name], [name]   . Reflux esophagitis 12/28/2008   Qualifier: Diagnosis of  By: Julaine Hua CMA (AAMA), [name]    . S/P lumbar spinal fusion 07/02/2012     Allergies  Allergen Reactions  . Crestor [Rosuvastatin]   . Erythromycin     rash  . Lansoprazole   . Penicillins     rash  . Potassium-Containing Compounds Other (See Comments)    Stomach upset   . Pravastatin   . Robaxin [Methocarbamol]     Messed up stomach  . Sulfonamide Derivatives     rash  . Trental [Pentoxifylline Er]     Bad headache    ROS General: Denies fever, chills, night sweats, changes in weight, changes in appetite HEENT: Denies headaches, ear pain, changes in vision, rhinorrhea, sore throat CV: Denies CP, palpitations, SOB, orthopnea Pulm: Denies SOB, cough, wheezing GI: Denies abdominal pain, nausea, vomiting, diarrhea, constipation GU: Denies dysuria, hematuria, frequency, vaginal discharge Msk: Denies muscle cramps, joint pains Neuro: Denies weakness, numbness, tingling Skin: Denies bruising  + pruritic rash on b/l forearms Psych: Denies depression, anxiety, hallucinations     Objective:    Blood pressure 124/70, pulse (!) 56, temperature 97.6 F (36.4 C), weight 240 lb (108.9 kg), SpO2 93 %.   Gen. Pleasant, well-nourished, in no distress, normal affect   HEENT: [place]/AT, face symmetric, no scleral icterus, PERRLA, nares patent without drainage Lungs: no accessory muscle use, CTAB, no wheezes or rales Cardiovascular: RRR, no m/r/g, no peripheral edema Neuro:  A&Ox3, CN II-XII intact, normal gait Skin:  Warm, dry.  A few erythematous plaques with serpiginous lesions on bilateral forearms.  A few of the lesions have eschar and excoriations 2/2 scratching.  No lesions in web spaces of fingers bilaterally.  Wt Readings from Last 3 Encounters:  01/11/18 240 lb 8 oz (109.1 kg)  12/05/17 244 lb 1.9 oz (110.7 kg)  10/31/17 243 lb 8 oz (110.5 kg)    Lab Results  Component Value Date   WBC 4.0 02/17/2016   HGB 14.2 02/17/2016   HCT 42.4 02/17/2016   PLT 228 02/17/2016   GLUCOSE 106 (H) 01/11/2018   CHOL 170 09/07/2017   TRIG 93.0 09/07/2017    HDL 51.90 09/07/2017   LDLCALC 100 (H) 09/07/2017   ALT 18 09/21/2017   AST 21 09/21/2017   NA 142 01/11/2018   K 3.6 01/11/2018   CL 102 01/11/2018   CREATININE 0.87 01/11/2018   BUN 18 01/11/2018   CO2 30 01/11/2018   TSH 2.47 09/07/2017   HGBA1C 6.7 (H) 09/07/2017   MICROALBUR 0.7 01/25/2017    Assessment/Plan:  Scabies  -Patient given handout -Patient advised to wash all bedding and clothing - Plan: permethrin (ELIMITE) 5 % cream, hydrOXYzine (ATARAX/VISTARIL) 25 MG tablet  Follow-up PRN  Grier Mitts, MD

## 2018-01-17 NOTE — Patient Instructions (Signed)
Scabies, Adult Scabies is a skin condition that happens when very small insects get under the skin (infestation). This causes a rash and severe itchiness. Scabies can spread from person to person (is contagious). If you get scabies, it is common for others in your household to get scabies too. With proper treatment, symptoms usually go away in 2-4 weeks. Scabies usually does not cause lasting problems. What are the causes? This condition is caused by mites (Sarcoptes scabiei, or human itch mites) that can only be seen with a microscope. The mites get into the top layer of skin and lay eggs. Scabies can spread from person to person through:  Close contact with a person who has scabies.  Contact with infested items, such as towels, bedding, or clothing.  What increases the risk? This condition is more likely to develop in:  People who live in nursing homes and other extended-care facilities.  People who have sexual contact with a partner who has scabies.  Young children who attend child care facilities.  People who care for others who are at increased risk for scabies.  What are the signs or symptoms? Symptoms of this condition may include:  Severe itchiness. This is often worse at night.  A rash that includes tiny red bumps or blisters. The rash commonly occurs on the wrist, elbow, armpit, fingers, waist, groin, or buttocks. Bumps may form a line (burrow) in some areas.  Skin irritation. This can include scaly patches or sores.  How is this diagnosed? This condition is diagnosed with a physical exam. Your health care provider will look closely at your skin. In some cases, your health care provider may take a sample of your affected skin (skin scraping) and have it examined under a microscope. How is this treated? This condition may be treated with:  Medicated cream or lotion that kills the mites. This is spread on the entire body and left on for several hours. Usually, one treatment  with medicated cream or lotion is enough to kill all of the mites. In severe cases, the treatment may be repeated.  Medicated cream that relieves itching.  Medicines that help to relieve itching.  Medicines that kill the mites. This treatment is rarely used.  Follow these instructions at home:  Medicines  Take or apply over-the-counter and prescription medicines as told by your health care provider.  Apply medicated cream or lotion as told by your health care provider.  Do not wash off the medicated cream or lotion until the necessary amount of time has passed. Skin Care  Avoid scratching your affected skin.  Keep your fingernails closely trimmed to reduce injury from scratching.  Take cool baths or apply cool washcloths to help reduce itching. General instructions  Clean all items that you recently had contact with, including bedding, clothing, and furniture. Do this on the same day that your treatment starts. ? Use hot water when you wash items. ? Place unwashable items into closed, airtight plastic bags for at least 3 days. The mites cannot live for more than 3 days away from human skin. ? Vacuum furniture and mattresses that you use.  Make sure that other people who may have been infested are examined by a health care provider. These include members of your household and anyone who may have had contact with infested items.  Keep all follow-up visits as told by your health care provider. This is important. Contact a health care provider if:  You have itching that does not go away   after 4 weeks of treatment.  You continue to develop new bumps or burrows.  You have redness, swelling, or pain in your rash area after treatment.  You have fluid, blood, or pus coming from your rash. This information is not intended to replace advice given to you by your health care provider. Make sure you discuss any questions you have with your health care provider. Document Released:  06/16/2015 Document Revised: 03/02/2016 Document Reviewed: 04/27/2015 Elsevier Interactive Patient Education  2018 [employer]  

## 2018-01-22 ENCOUNTER — Encounter: Payer: Self-pay | Admitting: Family Medicine

## 2018-01-22 ENCOUNTER — Ambulatory Visit (INDEPENDENT_AMBULATORY_CARE_PROVIDER_SITE_OTHER): Payer: BC Managed Care – PPO | Admitting: Family Medicine

## 2018-01-22 VITALS — BP 126/70 | HR 63 | Temp 98.0°F | Resp 12 | Ht 62.0 in | Wt 240.1 lb

## 2018-01-22 DIAGNOSIS — L259 Unspecified contact dermatitis, unspecified cause: Secondary | ICD-10-CM

## 2018-01-22 DIAGNOSIS — F419 Anxiety disorder, unspecified: Secondary | ICD-10-CM

## 2018-01-22 MED ORDER — TRIAMCINOLONE ACETONIDE 0.1 % EX CREA: 1.0000 "application " | TOPICAL_CREAM | Freq: Two times a day (BID) | CUTANEOUS | 0 refills | Status: AC

## 2018-01-22 NOTE — Progress Notes (Signed)
ACUTE VISIT   HPI:  Chief Complaint  Patient presents with  . Urticaria    was out in the yard messing with bushes and trees. Was seen on Thursday by Dr. Volanda Napoleon    [name] is a 67 y.o. female, who is here today complaining of persistent pruritic rash on forearms, which she attributes to allergy to plants she was handling when working on her yard.  She was seen recently for similar problem, 01/17/18, Dx and treated for scabies. She completed treatment.  She brought all plants she was exposed. She noted rash last week after she was cutting some leaves outdoors.She had been doing yard work for the past 2 days. First noted rash on right forearm and later on left forearm.  No new medication, detergent, soap, or body product. No known insect bite. No sick contact. Hx of eczema and reporting similar rash in the past.  Yesterday she had an episode of feeling her throat "tight" and "hives" om skin. This aggravated anxety,she is not sure if some of her symptoms were related to anxiety or rash. OTC medication for this problem: Benadryl.  She is applying Mupirocin oint on forearm lesions.   No oral lesions/edema,cough, wheezing, dyspnea, abdominal pain, nausea, or vomiting.    Review of Systems  Constitutional: Negative for appetite change, chills, fatigue and fever.  HENT: Negative for congestion, mouth sores, sneezing, sore throat, trouble swallowing and voice change.   Eyes: Negative for discharge and redness.  Respiratory: Negative for cough, shortness of breath, wheezing and stridor.   Cardiovascular: Negative for chest pain, palpitations and leg swelling.  Gastrointestinal: Negative for abdominal pain, diarrhea, nausea and vomiting.  Musculoskeletal: Positive for arthralgias and back pain. Negative for gait problem, joint swelling and myalgias.       Hx of OA and back pain.  Skin: Positive for rash.  Allergic/Immunologic: Positive for environmental  allergies.  Neurological: Negative for weakness and headaches.  Psychiatric/Behavioral: Negative for confusion. The patient is nervous/anxious.       Current Outpatient Medications on File Prior to Visit  Medication Sig Dispense Refill  . Aspirin-Caffeine 845-65 MG PACK Take 1-2 packets by mouth daily as needed (headache).    Marland Kitchen atenolol (TENORMIN) 25 MG tablet Take 1 tablet (25 mg total) by mouth daily. 90 tablet 3  . azelastine (ASTELIN) 0.1 % nasal spray Place 2 sprays into both nostrils 2 (two) times daily. Use in each nostril as directed 30 mL 11  . calcium carbonate (TUMS EX) 750 MG chewable tablet Chew 2 tablets by mouth daily as needed for heartburn.    . cyclobenzaprine (FLEXERIL) 10 MG tablet Take 2.5 mg by mouth daily as needed (pain level 8 or greater).     Marland Kitchen dimenhyDRINATE (DRAMAMINE) 50 MG tablet Take 50 mg by mouth every 8 (eight) hours as needed for nausea. Reported on 01/12/2016    . diphenhydrAMINE (BENADRYL) 25 MG tablet Take 25 mg by mouth daily as needed.    . fexofenadine (ALLEGRA) 60 MG tablet Take 60 mg by mouth daily.    . fluticasone (FLONASE) 50 MCG/ACT nasal spray USE TWO SPRAY (S) IN EACH NOSTRIL ONCE DAILY. 48 g 3  . guaiFENesin (MUCINEX) 600 MG 12 hr tablet Take 300 mg by mouth daily.    . hydrochlorothiazide (HYDRODIURIL) 25 MG tablet TAKE ONE TABLET BY MOUTH ONCE DAILY 90 tablet 1  . HYDROcodone-acetaminophen (NORCO/VICODIN) 5-325 MG tablet Take 0.5 tablets by mouth daily as needed (pain  level 8 or greater). 30 tablet 0  . hydrocortisone 2.5 % ointment APPLY TWICE A DAY 60 g 11  . hydrOXYzine (ATARAX/VISTARIL) 25 MG tablet Take 1 tablet (25 mg total) by mouth 2 (two) times daily as needed for itching. 20 tablet 0  . LORazepam (ATIVAN) 0.5 MG tablet Take 1 tablet (0.5 mg total) by mouth daily as needed for anxiety. 20 tablet 1  . Multiple Vitamins-Minerals (MULTI-VITAMIN GUMMIES PO) Take by mouth as needed.    Marland Kitchen omeprazole (PRILOSEC) 40 MG capsule Take 40 mg by  mouth daily.    . permethrin (ELIMITE) 5 % cream Apply cream from neck to feet, leave on 8-14 hours then wash with soap and water.  Repeat in 2 weeks if needed. 60 g 1  . Polyethylene Glycol 3350 (MIRALAX PO) Take by mouth.    . potassium chloride (K-DUR) 10 MEQ tablet Take 2 tablets (20 mEq total) by mouth daily. 180 tablet 3  . pravastatin (PRAVACHOL) 20 MG tablet Take 20 mg by mouth 3 (three) times daily.    Marland Kitchen amLODipine (NORVASC) 5 MG tablet Take 1 tablet (5 mg total) by mouth daily. 180 tablet 3   No current facility-administered medications on file prior to visit.      Past Medical History:  Diagnosis Date  . Abdominal pain, epigastric 12/28/2008   Centricity Description: ABDOMINAL PAIN, EPIGASTRIC Qualifier: Diagnosis of  By: [name], [name]   Centricity Description: ABDOMINAL PAIN-EPIGASTRIC Qualifier: Diagnosis of  By: [name], [name]   . Allergic rhinitis 09/16/2013  . Allergy   . Anemia   . Anxiety   . Anxiety disorder, unspecified 07/06/2017  . Arthritis   . Blood in stool   . BMI 45.0-49.9, adult ([place]) 07/26/2016  . Class 3 obesity with serious comorbidity and body mass index (BMI) of 45.0 to 49.9 in adult 03/07/2017  . DDD (degenerative disc disease), lumbar   . DIVERTICULOSIS-COLON 12/28/2008   Qualifier: Diagnosis of  By: [name], [name]   . GERD 12/28/2008   Qualifier: Diagnosis of  By: [name], [name]   . GERD (gastroesophageal reflux disease)   . Headache   . Hyperglycemia 03/17/2014  . Hypertension   . Hypokalemia 07/26/2016  . IBS (irritable bowel syndrome)-C-D 07/26/2016  . Obesity   . Other and unspecified hyperlipidemia 03/17/2014  . PERSONAL HX COLONIC POLYPS 12/28/2008   Qualifier: Diagnosis of  By: [name], [name]   . Reflux esophagitis 12/28/2008   Qualifier: Diagnosis of  By: Julaine Hua CMA (AAMA), [name]    . S/P lumbar spinal fusion 07/02/2012   Allergies  Allergen Reactions  . Crestor [Rosuvastatin]   . Erythromycin       rash  . Lansoprazole   . Penicillins     rash  . Potassium-Containing Compounds Other (See Comments)    Stomach upset   . Pravastatin   . Robaxin [Methocarbamol]     Messed up stomach  . Sulfonamide Derivatives     rash  . Trental [Pentoxifylline Er]     Bad headache    Social History   Socioeconomic History  . Marital status: Widowed    Spouse name: Not on file  . Number of children: Not on file  . Years of education: Not on file  . Highest education level: Not on file  Occupational History  . Not on file  Social Needs  . Financial resource strain: Not on file  . Food insecurity:    Worry:  Not on file    Inability: Not on file  . Transportation needs:    Medical: Not on file    Non-medical: Not on file  Tobacco Use  . Smoking status: Former Smoker    Packs/day: 1.00    Years: 38.00    Pack years: 38.00    Types: Cigarettes    Last attempt to quit: 10/28/1997    Years since quitting: 20.2  . Smokeless tobacco: Never Used  Substance and Sexual Activity  . Alcohol use: No  . Drug use: No  . Sexual activity: Never  Lifestyle  . Physical activity:    Days per week: Not on file    Minutes per session: Not on file  . Stress: Not on file  Relationships  . Social connections:    Talks on phone: Not on file    Gets together: Not on file    Attends religious service: Not on file    Active member of club or organization: Not on file    Attends meetings of clubs or organizations: Not on file    Relationship status: Not on file  Other Topics Concern  . 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    Vitals:   01/22/18 0946  BP: 126/70  Pulse: 63  Resp: 12  Temp: 98 F (36.7 C)  SpO2: 98%   Body mass index is 43.92 kg/m.    Physical Exam  Nursing note and vitals reviewed. Constitutional: She is oriented to person, place, and time. She appears well-developed. No distress.  HENT:  Head: Normocephalic and  atraumatic.  Mouth/Throat: Oropharynx is clear and moist and mucous membranes are normal.  Eyes: Conjunctivae are normal.  Cardiovascular: Normal rate and regular rhythm.  No murmur heard. Respiratory: Effort normal and breath sounds normal. No respiratory distress.  Musculoskeletal: She exhibits no edema.  Lymphadenopathy:    She has no cervical adenopathy.  Neurological: She is alert and oriented to person, place, and time. She has normal strength. Gait normal.  Skin: Skin is warm. Rash noted. Rash is not vesicular.     Scattered erythematous papular lesions on forearms with superficial excoriations/crust. Lesions are not tender. I do not appreciate urticaria.  Psychiatric: Her mood appears anxious.  Well groomed, good eye contact.      ASSESSMENT AND PLAN:  [name] was seen today for rash.  Diagnoses and all orders for this visit:  Contact dermatitis, unspecified contact dermatitis type, unspecified trigger  She is not interested in Depo Medrol today. Topical steroid recommended bid for 14 days. OTC Pepcid 20 mg bid and/or Zyrtec 10 mg bid for 7-10 days. Educated about the importance of prevention: Long sleeves and big hat when working outdoors. Monitor for signs of complications or worsening symptoms. F/U as needed.  -     triamcinolone cream (KENALOG) 0.1 %; Apply 1 application topically 2 (two) times daily for 14 days.  Anxiety disorder, unspecified type  Today I do not appreciate hives or oral edema.  ? Anxiety reactiion. No changes in Ativan.      Brendaliz Kuk G. [name], MD  [facility]. [place] office.

## 2018-01-22 NOTE — Patient Instructions (Addendum)
A few things to remember from today's visit:   Contact dermatitis, unspecified contact dermatitis type, unspecified trigger - Plan: triamcinolone cream (KENALOG) 0.1 %  They maintain his prevention, long sleeves and big hat when working on your yard. You can take over-the-counter Pepcid 20 mg twice daily for 7-10 days and/or Zyrtec 10 mg twice daily for 7-10 days. Caution with Benadryl.  Please be sure medication list is accurate. If a new problem present, please set up appointment sooner than planned today.

## 2018-02-01 ENCOUNTER — Ambulatory Visit (INDEPENDENT_AMBULATORY_CARE_PROVIDER_SITE_OTHER): Payer: BC Managed Care – PPO

## 2018-02-01 VITALS — BP 136/80 | HR 60 | Ht 62.0 in | Wt 239.0 lb

## 2018-02-01 DIAGNOSIS — E119 Type 2 diabetes mellitus without complications: Secondary | ICD-10-CM | POA: Diagnosis not present

## 2018-02-01 DIAGNOSIS — Z0000 Encounter for general adult medical examination without abnormal findings: Secondary | ICD-10-CM

## 2018-02-01 LAB — MICROALBUMIN / CREATININE URINE RATIO
CREATININE, U: 112.6 mg/dL
Microalb Creat Ratio: 0.6 mg/g (ref 0.0–30.0)

## 2018-02-01 NOTE — Patient Instructions (Addendum)
[name] , Thank you for taking time to come for your Medicare Wellness Visit. I appreciate your ongoing commitment to your health goals. Please review the following plan we discussed and let me know if I can assist you in the future.   Please make an AWV apt for next year  Go to the lab for ur specimen  [name] will attempt to get your eye exam report   Will try to complete AD; Given copy  Referred to [facility] for questions Pinch offers free advance directive forms, as well as assistance in completing the forms themselves. For assistance, contact the Spiritual Care Department at [phone], or the Clinical Social Work Department at [phone].  Shingrix is a vaccine for the prevention of Shingles in Adults 50 and older.  If you are on Medicare, the shingrix is covered under your Part D plan, so you will take both of the vaccines in the series at your pharmacy. Please check with your benefits regarding applicable copays or out of pocket expenses.  The Shingrix is given in 2 vaccines approx 8 weeks apart. You must receive the 2nd dose prior to 6 months from receipt of the first. Please have the pharmacist print out you Immunization  dates for our office records    [employer] [facility]; Ask for [facility] Can help with resources and has a Memory cafe Address: [address] [place], [place] [address]  Hours:  [place] Born soon ? 8:30AM Phone: [phone]  Derrel [email or web address] is a great resource   Please discuss PSV 23 with Dr. [name] since you do not feel you had this   The Centers for Disease Control are now recommending 2 pneumonia vaccinations after 58. The first is the Prevnar 13. This helps to boost your immunity to community acquired pneumonia as well as some protection from bacterial pneumonia  The 2nd is the pneumovax 23, which offers more broad protection!  Please consider taking these as this is your best protection against pneumonia.      These are the goals we  discussed: Goals    . Patient Stated     Plan a trip and travel when you can.        This is a list of the screening recommended for you and due dates:  Health Maintenance  Topic Date Due  . Eye exam for diabetics  07/26/1961  . Complete foot exam   01/25/2018  . Urine Protein Check  01/25/2018  . Hemoglobin A1C  03/07/2018  . Tetanus Vaccine  04/08/2018  . Flu Shot  05/09/2018  . Colon Cancer Screening  11/11/2018  . Mammogram  08/09/2019  . Pap Smear  04/27/2020  .  Hepatitis C: One time screening is recommended by Center for Disease Control  (CDC) for  adults born from [birth year] through 1965.   Completed  . HIV Screening  Completed    Health Maintenance, Female Adopting a healthy lifestyle and getting preventive care can go a long way to promote health and wellness. Talk with your health care provider about what schedule of regular examinations is right for you. This is a good chance for you to check in with your provider about disease prevention and staying healthy. In between checkups, there are plenty of things you can do on your own. Experts have done a lot of research about which lifestyle changes and preventive measures are most likely to keep you healthy. Ask your health care provider for more information. Weight and diet Eat a healthy  diet  Be sure to include plenty of vegetables, fruits, low-fat dairy products, and lean protein.  Do not eat a lot of foods high in solid fats, added sugars, or salt.  Get regular exercise. This is one of the most important things you can do for your health. ? Most adults should exercise for at least 150 minutes each week. The exercise should increase your heart rate and make you sweat (moderate-intensity exercise). ? Most adults should also do strengthening exercises at least twice a week. This is in addition to the moderate-intensity exercise.  Maintain a healthy weight  Body mass index (BMI) is a measurement that can be used to identify  possible weight problems. It estimates body fat based on height and weight. Your health care provider can help determine your BMI and help you achieve or maintain a healthy weight.  For females 19 years of age and older: ? A BMI below 18.5 is considered underweight. ? A BMI of 18.5 to 24.9 is normal. ? A BMI of 25 to 29.9 is considered overweight. ? A BMI of 30 and above is considered obese.  Watch levels of cholesterol and blood lipids  You should start having your blood tested for lipids and cholesterol at 65 years of age, then have this test every 5 years.  You may need to have your cholesterol levels checked more often if: ? Your lipid or cholesterol levels are high. ? You are older than 65 years of age. ? You are at high risk for heart disease.  Cancer screening Lung Cancer  Lung cancer screening is recommended for adults 48-80 years old who are at high risk for lung cancer because of a history of smoking.  A yearly low-dose CT scan of the lungs is recommended for people who: ? Currently smoke. ? Have quit within the past 15 years. ? Have at least a 30-pack-year history of smoking. A pack year is smoking an average of one pack of cigarettes a day for 1 year.  Yearly screening should continue until it has been 15 years since you quit.  Yearly screening should stop if you develop a health problem that would prevent you from having lung cancer treatment.  Breast Cancer  Practice breast self-awareness. This means understanding how your breasts normally appear and feel.  It also means doing regular breast self-exams. Let your health care provider know about any changes, no matter how small.  If you are in your 20s or 30s, you should have a clinical breast exam (CBE) by a health care provider every 1-3 years as part of a regular health exam.  If you are 36 or older, have a CBE every year. Also consider having a breast X-ray (mammogram) every year.  If you have a family history  of breast cancer, talk to your health care provider about genetic screening.  If you are at high risk for breast cancer, talk to your health care provider about having an MRI and a mammogram every year.  Breast cancer gene (BRCA) assessment is recommended for women who have family members with BRCA-related cancers. BRCA-related cancers include: ? Breast. ? Ovarian. ? Tubal. ? Peritoneal cancers.  Results of the assessment will determine the need for genetic counseling and BRCA1 and BRCA2 testing.  Cervical Cancer Your health care provider may recommend that you be screened regularly for cancer of the pelvic organs (ovaries, uterus, and vagina). This screening involves a pelvic examination, including checking for microscopic changes to the surface of your  cervix (Pap test). You may be encouraged to have this screening done every 3 years, beginning at age 43.  For women ages 17-65, health care providers may recommend pelvic exams and Pap testing every 3 years, or they may recommend the Pap and pelvic exam, combined with testing for human papilloma virus (HPV), every 5 years. Some types of HPV increase your risk of cervical cancer. Testing for HPV may also be done on women of any age with unclear Pap test results.  Other health care providers may not recommend any screening for nonpregnant women who are considered low risk for pelvic cancer and who do not have symptoms. Ask your health care provider if a screening pelvic exam is right for you.  If you have had past treatment for cervical cancer or a condition that could lead to cancer, you need Pap tests and screening for cancer for at least 20 years after your treatment. If Pap tests have been discontinued, your risk factors (such as having a new sexual partner) need to be reassessed to determine if screening should resume. Some women have medical problems that increase the chance of getting cervical cancer. In these cases, your health care provider  may recommend more frequent screening and Pap tests.  Colorectal Cancer  This type of cancer can be detected and often prevented.  Routine colorectal cancer screening usually begins at 65 years of age and continues through 65 years of age.  Your health care provider may recommend screening at an earlier age if you have risk factors for colon cancer.  Your health care provider may also recommend using home test kits to check for hidden blood in the stool.  A small camera at the end of a tube can be used to examine your colon directly (sigmoidoscopy or colonoscopy). This is done to check for the earliest forms of colorectal cancer.  Routine screening usually begins at age 52.  Direct examination of the colon should be repeated every 5-10 years through 65 years of age. However, you may need to be screened more often if early forms of precancerous polyps or small growths are found.  Skin Cancer  Check your skin from head to toe regularly.  Tell your health care provider about any new moles or changes in moles, especially if there is a change in a mole's shape or color.  Also tell your health care provider if you have a mole that is larger than the size of a pencil eraser.  Always use sunscreen. Apply sunscreen liberally and repeatedly throughout the day.  Protect yourself by wearing long sleeves, pants, a wide-brimmed hat, and sunglasses whenever you are outside.  Heart disease, diabetes, and high blood pressure  High blood pressure causes heart disease and increases the risk of stroke. High blood pressure is more likely to develop in: ? People who have blood pressure in the high end of the normal range (130-139/85-89 mm Hg). ? People who are overweight or obese. ? People who are African American.  If you are 8-50 years of age, have your blood pressure checked every 3-5 years. If you are 30 years of age or older, have your blood pressure checked every year. You should have your  blood pressure measured twice-once when you are at a hospital or clinic, and once when you are not at a hospital or clinic. Record the average of the two measurements. To check your blood pressure when you are not at a hospital or clinic, you can use: ? An  automated blood pressure machine at a pharmacy. ? A home blood pressure monitor.  If you are between 67 years and 29 years old, ask your health care provider if you should take aspirin to prevent strokes.  Have regular diabetes screenings. This involves taking a blood sample to check your fasting blood sugar level. ? If you are at a normal weight and have a low risk for diabetes, have this test once every three years after 65 years of age. ? If you are overweight and have a high risk for diabetes, consider being tested at a younger age or more often. Preventing infection Hepatitis B  If you have a higher risk for hepatitis B, you should be screened for this virus. You are considered at high risk for hepatitis B if: ? You were born in a country where hepatitis B is common. Ask your health care provider which countries are considered high risk. ? Your parents were born in a high-risk country, and you have not been immunized against hepatitis B (hepatitis B vaccine). ? You have HIV or AIDS. ? You use needles to inject street drugs. ? You live with someone who has hepatitis B. ? You have had sex with someone who has hepatitis B. ? You get hemodialysis treatment. ? You take certain medicines for conditions, including cancer, organ transplantation, and autoimmune conditions.  Hepatitis C  Blood testing is recommended for: ? Everyone born from [birth year] through 1965. ? Anyone with known risk factors for hepatitis C.  Sexually transmitted infections (STIs)  You should be screened for sexually transmitted infections (STIs) including gonorrhea and chlamydia if: ? You are sexually active and are younger than 65 years of age. ? You are older than 65  years of age and your health care provider tells you that you are at risk for this type of infection. ? Your sexual activity has changed since you were last screened and you are at an increased risk for chlamydia or gonorrhea. Ask your health care provider if you are at risk.  If you do not have HIV, but are at risk, it may be recommended that you take a prescription medicine daily to prevent HIV infection. This is called pre-exposure prophylaxis (PrEP). You are considered at risk if: ? You are sexually active and do not regularly use condoms or know the HIV status of your partner(s). ? You take drugs by injection. ? You are sexually active with a partner who has HIV.  Talk with your health care provider about whether you are at high risk of being infected with HIV. If you choose to begin PrEP, you should first be tested for HIV. You should then be tested every 3 months for as long as you are taking PrEP. Pregnancy  If you are premenopausal and you may become pregnant, ask your health care provider about preconception counseling.  If you may become pregnant, take 400 to 800 micrograms (mcg) of folic acid every day.  If you want to prevent pregnancy, talk to your health care provider about birth control (contraception). Osteoporosis and menopause  Osteoporosis is a disease in which the bones lose minerals and strength with aging. This can result in serious bone fractures. Your risk for osteoporosis can be identified using a bone density scan.  If you are 53 years of age or older, or if you are at risk for osteoporosis and fractures, ask your health care provider if you should be screened.  Ask your health care provider whether you should  take a calcium or vitamin D supplement to lower your risk for osteoporosis.  Menopause may have certain physical symptoms and risks.  Hormone replacement therapy may reduce some of these symptoms and risks. Talk to your health care provider about whether  hormone replacement therapy is right for you. Follow these instructions at home:  Schedule regular health, dental, and eye exams.  Stay current with your immunizations.  Do not use any tobacco products including cigarettes, chewing tobacco, or electronic cigarettes.  If you are pregnant, do not drink alcohol.  If you are breastfeeding, limit how much and how often you drink alcohol.  Limit alcohol intake to no more than 1 drink per day for nonpregnant women. One drink equals 12 ounces of beer, 5 ounces of wine, or 1 ounces of hard liquor.  Do not use street drugs.  Do not share needles.  Ask your health care provider for help if you need support or information about quitting drugs.  Tell your health care provider if you often feel depressed.  Tell your health care provider if you have ever been abused or do not feel safe at home. This information is not intended to replace advice given to you by your health care provider. Make sure you discuss any questions you have with your health care provider. Document Released: 04/10/2011 Document Revised: 03/02/2016 Document Reviewed: 06/29/2015 Elsevier Interactive Patient Education  2018 [employer].   Prevention of falls: Remove rugs or any tripping hazards in the home Use Non slip mats in bathtubs and showers Placing grab bars next to the toilet and or shower Placing handrails on both sides of the stair way Adding extra lighting in the home.   Personal safety issues reviewed:  1. Consider starting a community watch program per [facility] 2.  Changes batteries is smoke detector and/or carbon monoxide detector  3.  If you have firearms; keep them in a safe place 4.  Wear protection when in the sun; Always wear sunscreen or a hat; It is good to have your doctor check your skin annually or review any new areas of concern 5. Driving safety; Keep in the right lane; stay 3 car lengths behind the car in front of you on the  highway; look 3 times prior to pulling out; carry your cell phone everywhere you go!        Community Occupational psychologist of Services Cost  A Matter of Balance Class locations vary. Call [place] [facility] A[facility] for more information.  http://dawson-may.[email or web address]/ [phone] 8-Session program addressing the fear of falling and increasing activity levels of older adults Free to minimal cost  A.C.T. By [employer] [address], [place], [place] [address].  BetaBlues.dk [phone]  Personal training, gym, classes including Silver Sneakers* and ACTion for Aging Adults Fee-based  A.H.O.Y. (Add Health to [place]) Airs on Time [employer] 13, M-F at [place]: [employer],  [place] [facility] [place],  [place], [place] [facility], [address] [facility], [place], [place], [address]  [place] Location: Sharrell Ku. [employer] [place] [employer]      [phone]  [phone]  [phone]  [phone]  [phone]  [phone]  [phone]  [phone]  [phone]  [phone]    [phone] A total-body conditioning class for adults 55 and older; designed to increase muscular strength, endurance, range of movement, flexibility, balance, agility and coordination Free  [facility] [place], [place] [address] [place]      [address]      [phone]      Pilate's class for individualsreturning to exercise after an injury, before or after surgery or for individuals with complex  musculoskeletal issues; designed to improve strength, balance , flexibility      $15/class  [place] [address] [place], [place] [address] [email or web address] [place] classes for beginners to advanced [place], [place] [address] [email or web address] [place] Chair Exercises Free, ages 9 and older; Ages 18-59 fee based  Marvia Pickles, [facility] [address] [place], [place] [address] [email or web address] .Beverlee Nims [phone]  A.H.O.Y. Tai Chi Fee-based Donation based or free  [place] Class locations vary.  Call or email Angela Burke or view website for more information. [email or web address] [email or web address] [phone] Ongoing classes at local YMCAs and gyms Fee-based  Silver Sneakers A.C.T. By [place] [employer]: [employer] [place] [phone]  [phone] Classes designed for older adults who want to improve their strength, flexibility, balance and endurance.   Silver sneakers is covered by some insurance plans and includes a fitness center membership at participating locations. Find out more by calling [phone] or visiting [email or web address] Covered by some insurance plans  [facility] [phone] A.H.O.Y., fitness room, personal training, fitness classes for injury prevention, strength, balance, flexibility, water  fitness classes Ages 55+: $41 for 6 months; Ages 54-54: $35 for 6 months  Tai Chi for Everybody [facility] [address] [place], [place] [address] [email or web address] .Patsi Sears [phone] Tai Chi classes for beginners to advanced; geared for seniors Donation Based      [employer] ([place] Others Participate in Exercise     Loyal Gambler. Rosana Hoes, PhD, [place] [email or web address] .[email or web address] [place]     [phone]     A comprehensive fitness program for adults.  The program paris senior-level undergraduates Kinesiology students with adults who desire to learn how to exercise safely.  Includes a structural exercise class focusing on functional fitnesss     $100/semester in fall and spring; $75 in summer (no trainers)    *Silver Sneakers is covered by some Personal assistant and includes a  Radio producer at participating locations.  Find out more by calling [phone] or visiting [email or web address]  For additional health and human services resources for senior adults, please contact SeniorLine at [phone] in [place] and [place] at [phone] in all other areas.  Preventing Type 2 Diabetes Mellitus Type 2 diabetes (type 2 diabetes mellitus) is a long-term (chronic) disease that affects blood sugar (glucose) levels. Normally, a hormone called insulin allows glucose to enter cells in the body. The cells use glucose for energy. In type 2 diabetes, one or both of these problems may be  present:  The body does not make enough insulin.  The body does not respond properly to insulin that it makes (insulin resistance).  Insulin resistance or lack of insulin causes excess glucose to build up in the blood instead of going into cells. As a result, high blood glucose (hyperglycemia) develops, which can cause many complications. Being overweight or obese and having an inactive (sedentary) lifestyle can increase your risk for diabetes. Type 2 diabetes can be delayed or  prevented by making certain nutrition and lifestyle changes. What nutrition changes can be made?  Eat healthy meals and snacks regularly. Keep a healthy snack with you for when you get hungry between meals, such as fruit or a handful of nuts.  Eat lean meats and proteins that are low in saturated fats, such as chicken, fish, egg whites, and beans. Avoid processed meats.  Eat plenty of fruits and vegetables and plenty of grains that have not been processed (whole grains). It is recommended that you eat: ? 1?2 cups of fruit every day. ? 2?3 cups of vegetables every day. ? 6?8 oz of whole grains every day, such as oats, whole wheat, bulgur, brown rice, quinoa, and millet.  Eat low-fat dairy products, such as milk, yogurt, and cheese.  Eat foods that contain healthy fats, such as nuts, avocado, olive oil, and canola oil.  Drink water throughout the day. Avoid drinks that contain added sugar, such as soda or sweet tea.  Follow instructions from your health care provider about specific eating or drinking restrictions.  Control how much food you eat at a time (portion size). ? Check food labels to find out the serving sizes of foods. ? Use a kitchen scale to weigh amounts of foods.  Saute or steam food instead of frying it. Cook with water or broth instead of oils or butter.  Limit your intake of: ? Salt (sodium). Have no more than 1 tsp (2,400 mg) of sodium a day. If you have heart disease or high blood pressure, have less than ? tsp (1,500 mg) of sodium a day. ? Saturated fat. This is fat that is solid at room temperature, such as butter or fat on meat. What lifestyle changes can be made?  Activity  Do moderate-intensity physical activity for at least 30 minutes on at least 5 days of the week, or as much as told by your health care provider.  Ask your health care provider what activities are safe for you. A mix of physical activities may be best, such as walking, swimming, cycling,  and strength training.  Try to add physical activity into your day. For example: ? Park in spots that are farther away than usual, so that you walk more. For example, park in a far corner of the parking lot when you go to the office or the grocery store. ? Take a walk during your lunch break. ? Use stairs instead of elevators or escalators. Weight Loss  Lose weight as directed. Your health care provider can determine how much weight loss is best for you and can help you lose weight safely.  If you are overweight or obese, you may be instructed to lose at least 5?7 % of your body weight. Alcohol and Tobacco   Limit alcohol intake to no more than 1 drink a day for nonpregnant women and 2 drinks a day for men. One drink equals 12 oz of beer, 5 oz of wine, or 1 oz of hard liquor.  Do not use any tobacco  products, such as cigarettes, chewing tobacco, and e-cigarettes. If you need help quitting, ask your health care provider. Work With [place] Provider  Have your blood glucose tested regularly, as told by your health care provider.  Discuss your risk factors and how you can reduce your risk for diabetes.  Get screening tests as told by your health care provider. You may have screening tests regularly, especially if you have certain risk factors for type 2 diabetes.  Make an appointment with a diet and nutrition specialist (registered dietitian). A registered dietitian can help you make a healthy eating plan and can help you understand portion sizes and food labels. Why are these changes important?  It is possible to prevent or delay type 2 diabetes and related health problems by making lifestyle and nutrition changes.  It can be difficult to recognize signs of type 2 diabetes. The best way to avoid possible damage to your body is to take actions to prevent the disease before you develop symptoms. What can happen if changes are not made?  Your blood glucose levels may keep  increasing. Having high blood glucose for a long time is dangerous. Too much glucose in your blood can damage your blood vessels, heart, kidneys, nerves, and eyes.  You may develop prediabetes or type 2 diabetes. Type 2 diabetes can lead to many chronic health problems and complications, such as: ? Heart disease. ? Stroke. ? Blindness. ? Kidney disease. ? Depression. ? Poor circulation in the feet and legs, which could lead to surgical removal (amputation) in severe cases. Where to find support:  Ask your health care provider to recommend a registered dietitian, diabetes educator, or weight loss program.  Look for local or online weight loss groups.  Join a gym, fitness club, or outdoor activity group, such as a walking club. Where to find more information: To learn more about diabetes and diabetes prevention, visit:  American Diabetes Association (ADA): www.diabetes.[employer] of Diabetes and Digestive and Kidney Diseases: FindSpin.nl  To learn more about healthy eating, visit:  The U.S. Department of Agriculture Scientist, research (physical sciences)), Choose My Plate: http://wiley-williams.com/  Office of Disease Prevention and Health Promotion (ODPHP), Dietary Guidelines: SurferLive.at  Summary  You can reduce your risk for type 2 diabetes by increasing your physical activity, eating healthy foods, and losing weight as directed.  Talk with your health care provider about your risk for type 2 diabetes. Ask about any blood tests or screening tests that you need to have. This information is not intended to replace advice given to you by your health care provider. Make sure you discuss any questions you have with your health care provider. Document Released: 01/17/2016 Document Revised: 03/02/2016 Document Reviewed: 11/16/2015 Elsevier Interactive Patient Education  2018 [employer].   Diabetes and Foot Care Diabetes may cause you to  have problems because of poor blood supply (circulation) to your feet and legs. This may cause the skin on your feet to become thinner, break easier, and heal more slowly. Your skin may become dry, and the skin may peel and crack. You may also have nerve damage in your legs and feet causing decreased feeling in them. You may not notice minor injuries to your feet that could lead to infections or more serious problems. Taking care of your feet is one of the most important things you can do for yourself. Follow these instructions at home:  Wear shoes at all times, even in the house. Do not go barefoot. Bare feet  are easily injured.  Check your feet daily for blisters, cuts, and redness. If you cannot see the bottom of your feet, use a mirror or ask someone for help.  Wash your feet with warm water (do not use hot water) and mild soap. Then pat your feet and the areas between your toes until they are completely dry. Do not soak your feet as this can dry your skin.  Apply a moisturizing lotion or petroleum jelly (that does not contain alcohol and is unscented) to the skin on your feet and to dry, brittle toenails. Do not apply lotion between your toes.  Trim your toenails straight across. Do not dig under them or around the cuticle. File the edges of your nails with an emery board or nail file.  Do not cut corns or calluses or try to remove them with medicine.  Wear clean socks or stockings every day. Make sure they are not too tight. Do not wear knee-high stockings since they may decrease blood flow to your legs.  Wear shoes that fit properly and have enough cushioning. To break in new shoes, wear them for just a few hours a day. This prevents you from injuring your feet. Always look in your shoes before you put them on to be sure there are no objects inside.  Do not cross your legs. This may decrease the blood flow to your feet.  If you find a minor scrape, cut, or break in the skin on your feet,  keep it and the skin around it clean and dry. These areas may be cleansed with mild soap and water. Do not cleanse the area with peroxide, alcohol, or iodine.  When you remove an adhesive bandage, be sure not to damage the skin around it.  If you have a wound, look at it several times a day to make sure it is healing.  Do not use heating pads or hot water bottles. They may burn your skin. If you have lost feeling in your feet or legs, you may not know it is happening until it is too late.  Make sure your health care provider performs a complete foot exam at least annually or more often if you have foot problems. Report any cuts, sores, or bruises to your health care provider immediately. Contact a health care provider if:  You have an injury that is not healing.  You have cuts or breaks in the skin.  You have an ingrown nail.  You notice redness on your legs or feet.  You feel burning or tingling in your legs or feet.  You have pain or cramps in your legs and feet.  Your legs or feet are numb.  Your feet always feel cold. Get help right away if:  There is increasing redness, swelling, or pain in or around a wound.  There is a red line that goes up your leg.  Pus is coming from a wound.  You develop a fever or as directed by your health care provider.  You notice a bad smell coming from an ulcer or wound. This information is not intended to replace advice given to you by your health care provider. Make sure you discuss any questions you have with your health care provider. Document Released: 09/22/2000 Document Revised: 03/02/2016 Document Reviewed: 03/04/2013 Elsevier Interactive Patient Education  2017 [place] Directive Advance directives are legal documents that let you make choices ahead of time about your health care and medical treatment in  case you become unable to communicate for yourself. Advance directives are a way for you to communicate your  wishes to family, friends, and health care providers. This can help convey your decisions about end-of-life care if you become unable to communicate. Discussing and writing advance directives should happen over time rather than all at once. Advance directives can be changed depending on your situation and what you want, even after you have signed the advance directives. If you do not have an advance directive, some states assign family decision makers to act on your behalf based on how closely you are related to them. Each state has its own laws regarding advance directives. You may want to check with your health care provider, attorney, or state representative about the laws in your state. There are different types of advance directives, such as:  Medical power of attorney.  Living will.  Do not resuscitate (DNR) or do not attempt resuscitation (DNAR) order.  Health care proxy and medical power of attorney A health care proxy, also called a health care agent, is a person who is appointed to make medical decisions for you in cases in which you are unable to make the decisions yourself. Generally, people choose someone they know well and trust to represent their preferences. Make sure to ask this person for an agreement to act as your proxy. A proxy may have to exercise judgment in the event of a medical decision for which your wishes are not known. A medical power of attorney is a legal document that names your health care proxy. Depending on the laws in your state, after the document is written, it may also need to be:  Signed.  Notarized.  Dated.  Copied.  Witnessed.  Incorporated into your medical record.  You may also want to appoint someone to manage your financial affairs in a situation in which you are unable to do so. This is called a durable power of attorney for finances. It is a separate legal document from the durable power of attorney for health care. You may choose the same  person or someone different from your health care proxy to act as your agent in financial matters. If you do not appoint a proxy, or if there is a concern that the proxy is not acting in your best interests, a court-appointed guardian may be designated to act on your behalf. Living will A living will is a set of instructions documenting your wishes about medical care when you cannot express them yourself. Health care providers should keep a copy of your living will in your medical record. You may want to give a copy to family members or friends. To alert caregivers in case of an emergency, you can place a card in your wallet to let them know that you have a living will and where they can find it. A living will is used if you become:  Terminally ill.  Incapacitated.  Unable to communicate or make decisions.  Items to consider in your living will include:  The use or non-use of life-sustaining equipment, such as dialysis machines and breathing machines (ventilators).  A DNR or DNAR order, which is the instruction not to use cardiopulmonary resuscitation (CPR) if breathing or heartbeat stops.  The use or non-use of tube feeding.  Withholding of food and fluids.  Comfort (palliative) care when the goal becomes comfort rather than a cure.  Organ and tissue donation.  A living will does not give instructions for distributing your money  and property if you should pass away. It is recommended that you seek the advice of a lawyer when writing a will. Decisions about taxes, beneficiaries, and asset distribution will be legally binding. This process can relieve your family and friends of any concerns surrounding disputes or questions that may come up about the distribution of your assets. DNR or DNAR A DNR or DNAR order is a request not to have CPR in the event that your heart stops beating or you stop breathing. If a DNR or DNAR order has not been made and shared, a health care provider will try to  help any patient whose heart has stopped or who has stopped breathing. If you plan to have surgery, talk with your health care provider about how your DNR or DNAR order will be followed if problems occur. Summary  Advance directives are the legal documents that allow you to make choices ahead of time about your health care and medical treatment in case you become unable to communicate for yourself.  The process of discussing and writing advance directives should happen over time. You can change the advance directives, even after you have signed them.  Advance directives include DNR or DNAR orders, living wills, and designating an agent as your medical power of attorney. This information is not intended to replace advice given to you by your health care provider. Make sure you discuss any questions you have with your health care provider. Document Released: 01/02/2008 Document Revised: 08/14/2016 Document Reviewed: 08/14/2016 Elsevier Interactive Patient Education  2017 [employer].

## 2018-02-01 NOTE — Progress Notes (Signed)
Subjective:   [name] is a 67 y.o. female who presents for Medicare Annual (Subsequent) preventive examination.  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 who is coming in for AWV in May Married 3 times; first spouse died 2nd spouse died  80rd spouse got cancer; and he died as well    17-Jan-2006 d/a due to back injury   Diet Has gone from 11 to 40 and she has been off steroids for 6 months BMI 43 Chol/hdl 3;  A1c 6.7   She is losing weight off the steroids  Breakfast; skips sometimes- or an egg; piece of toast, 2 slices of bacon baked Hx of Acid reflux  Has had a dietician prior and monitors her diet as tolerated Buys vegetables in [place]   Exercise  Takes care of mother 23 with dementia   Tobacco use x 38 pack years   Health Maintenance Due  Topic Date Due  . OPHTHALMOLOGY EXAM  07/26/1963  . FOOT EXAM  01/25/2018  . URINE MICROALBUMIN  01/25/2018   Had an eye exam recently; Dr. Gershon Crane  No Retinopathy;   Checks feet; files her nails; feet crack  Colonoscopy 11/2013;  Mammogram 07/2017   Cardiac Risk Factors include: advanced age (>70men, >64 women);diabetes mellitus;dyslipidemia;family history of premature cardiovascular disease;hypertension;obesity (BMI >30kg/m2)   sleep goes to bed late 2 or 3 am and then gets up early Mother gets up between 10:30 and 12 noon.      Objective:     Vitals: BP 136/80   Pulse 60   Ht 5\' 2"  (1.575 m)   Wt 239 lb (108.4 kg)   SpO2 97%   BMI 43.71 kg/m   Body mass index is 43.71 kg/m.  Advanced Directives 02/01/2018 04/27/2017 02/22/2015  Does Patient Have a Medical Advance Directive? No No No  Would patient like information on creating a medical advance directive? - - No - patient declined information    Tobacco Social History   Tobacco Use  Smoking Status Former Smoker  . Packs/day: 1.00  . Years: 38.00  . Pack years: 38.00  . Types: Cigarettes  . Last  attempt to quit: 10/28/1997  . Years since quitting: 20.2  Smokeless Tobacco Never Used     Counseling given: Yes   Clinical Intake:     Past Medical History:  Diagnosis Date  . Abdominal pain, epigastric 12/28/2008   Centricity Description: ABDOMINAL PAIN, EPIGASTRIC Qualifier: Diagnosis of  By: [name], [name]   Centricity Description: ABDOMINAL PAIN-EPIGASTRIC Qualifier: Diagnosis of  By: [name], [name]   . Allergic rhinitis 09/16/2013  . Allergy   . Anemia   . Anxiety   . Anxiety disorder, unspecified 07/06/2017  . Arthritis   . Blood in stool   . BMI 45.0-49.9, adult ([facility]) 07/26/2016  . Class 3 obesity with serious comorbidity and body mass index (BMI) of 45.0 to 49.9 in adult 03/07/2017  . DDD (degenerative disc disease), lumbar   . DIVERTICULOSIS-COLON 12/28/2008   Qualifier: Diagnosis of  By: [name], [name]   . GERD 12/28/2008   Qualifier: Diagnosis of  By: [name], [name]   . GERD (gastroesophageal reflux disease)   . Headache   . Hyperglycemia 03/17/2014  . Hypertension   . Hypokalemia 07/26/2016  . IBS (irritable bowel syndrome)-C-D 07/26/2016  . Obesity   . Other and unspecified hyperlipidemia 03/17/2014  . PERSONAL HX COLONIC POLYPS 12/28/2008  Qualifier: Diagnosis of  By: [name], [name]   . Reflux esophagitis 12/28/2008   Qualifier: Diagnosis of  By: Julaine Hua CMA (AAMA), [name]    . S/P lumbar spinal fusion 07/02/2012   Past Surgical History:  Procedure Laterality Date  . ABDOMINAL HYSTERECTOMY    . COLONOSCOPY  2014  . RADIOLOGY WITH ANESTHESIA N/A 02/25/2015   Procedure: MRI OF LUMBAR SPINE;  Surgeon: Medication Radiologist, MD;  Location: [facility];  Service: Radiology;  Laterality: N/A;  DR. MAX COHEN/MRI  . SPINE SURGERY    . TONSILLECTOMY AND ADENOIDECTOMY     Family History  Problem Relation Age of Onset  . Hypertension Mother   . Hyperlipidemia Mother   . Hyperlipidemia Father   . Hypertension Father   . Arthritis  Father   . Hyperlipidemia Sister   . Cervical cancer Sister   . Diabetes Sister   . Heart disease Sister   . Cancer Maternal Grandfather   . Hypertension Paternal Grandmother   . Pancreatic cancer Maternal Aunt   . Breast cancer Paternal Aunt   . Breast cancer Cousin    Social History   Socioeconomic History  . Marital status: Widowed    Spouse name: Not on file  . Number of children: Not on file  . Years of education: Not on file  . Highest education level: Not on file  Occupational History  . Not on file  Social Needs  . Financial resource strain: Not on file  . Food insecurity:    Worry: Not on file    Inability: Not on file  . Transportation needs:    Medical: Not on file    Non-medical: Not on file  Tobacco Use  . Smoking status: Former Smoker    Packs/day: 1.00    Years: 38.00    Pack years: 38.00    Types: Cigarettes    Last attempt to quit: 10/28/1997    Years since quitting: 20.2  . Smokeless tobacco: Never Used  Substance and Sexual Activity  . Alcohol use: No  . Drug use: No  . Sexual activity: Never  Lifestyle  . Physical activity:    Days per week: Not on file    Minutes per session: Not on file  . Stress: Not on file  Relationships  . Social connections:    Talks on phone: Not on file    Gets together: Not on file    Attends religious service: Not on file    Active member of club or organization: Not on file    Attends meetings of clubs or organizations: Not on file    Relationship status: Not on file  Other Topics Concern  . 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    Outpatient Encounter Medications as of 02/01/2018  Medication Sig  . Aspirin-Caffeine 845-65 MG PACK Take 1-2 packets by mouth daily as needed (headache).  Marland Kitchen atenolol (TENORMIN) 25 MG tablet Take 1 tablet (25 mg total) by mouth daily.  Marland Kitchen azelastine (ASTELIN) 0.1 % nasal spray Place 2 sprays into both nostrils 2 (two) times daily. Use  in each nostril as directed  . calcium carbonate (TUMS EX) 750 MG chewable tablet Chew 2 tablets by mouth daily as needed for heartburn.  . cyclobenzaprine (FLEXERIL) 10 MG tablet Take 2.5 mg by mouth daily as needed (pain level 8 or greater).   Marland Kitchen dimenhyDRINATE (DRAMAMINE) 50 MG tablet Take 50 mg by mouth every 8 (  eight) hours as needed for nausea. Reported on 01/12/2016  . diphenhydrAMINE (BENADRYL) 25 MG tablet Take 25 mg by mouth daily as needed.  . fexofenadine (ALLEGRA) 60 MG tablet Take 60 mg by mouth daily.  . fluticasone (FLONASE) 50 MCG/ACT nasal spray USE TWO SPRAY (S) IN EACH NOSTRIL ONCE DAILY.  Marland Kitchen guaiFENesin (MUCINEX) 600 MG 12 hr tablet Take 300 mg by mouth daily.  . hydrochlorothiazide (HYDRODIURIL) 25 MG tablet TAKE ONE TABLET BY MOUTH ONCE DAILY  . HYDROcodone-acetaminophen (NORCO/VICODIN) 5-325 MG tablet Take 0.5 tablets by mouth daily as needed (pain level 8 or greater).  . hydrocortisone 2.5 % ointment APPLY TWICE A DAY  . hydrOXYzine (ATARAX/VISTARIL) 25 MG tablet Take 1 tablet (25 mg total) by mouth 2 (two) times daily as needed for itching.  Marland Kitchen LORazepam (ATIVAN) 0.5 MG tablet Take 1 tablet (0.5 mg total) by mouth daily as needed for anxiety.  . Multiple Vitamins-Minerals (MULTI-VITAMIN GUMMIES PO) Take by mouth as needed.  Marland Kitchen omeprazole (PRILOSEC) 40 MG capsule Take 40 mg by mouth daily.  . permethrin (ELIMITE) 5 % cream Apply cream from neck to feet, leave on 8-14 hours then wash with soap and water.  Repeat in 2 weeks if needed.  . Polyethylene Glycol 3350 (MIRALAX PO) Take by mouth.  . potassium chloride (K-DUR) 10 MEQ tablet Take 2 tablets (20 mEq total) by mouth daily.  . pravastatin (PRAVACHOL) 20 MG tablet Take 20 mg by mouth 3 (three) times daily.  Marland Kitchen triamcinolone cream (KENALOG) 0.1 % Apply 1 application topically 2 (two) times daily for 14 days.  Marland Kitchen amLODipine (NORVASC) 5 MG tablet Take 1 tablet (5 mg total) by mouth daily.   No facility-administered encounter  medications on file as of 02/01/2018.     Activities of Daily Living In your present state of health, do you have any difficulty performing the following activities: 02/01/2018 04/27/2017  Hearing? N N  Vision? N N  Difficulty concentrating or making decisions? N N  Walking or climbing stairs? Y Y  Dressing or bathing? N N  Doing errands, shopping? N N  Preparing Food and eating ? N -  Using the Toilet? N -  In the past six months, have you accidently leaked urine? N -  Do you have problems with loss of bowel control? Y -  Comment ibs -  Managing your Medications? N -  Managing your Finances? N -  Housekeeping or managing your Housekeeping? N -  Some recent data might be hidden    Patient Care Team: [name], [name], MD as PCP - General (Family Medicine) Nahser, Wonda Cheng, MD as PCP - Cardiology (Cardiology)    Assessment:   This is a routine wellness examination for Justise.  Exercise Activities and Dietary recommendations Current Exercise Habits: Home exercise routine, Type of exercise: walking, Intensity: Mild, Exercise limited by: orthopedic condition(s)  Goals    . Patient Stated     Plan a trip and travel when you can.        Fall Risk Fall Risk  02/01/2018 04/27/2017 03/14/2016 02/17/2016 03/23/2015  Falls in the past year? Yes No No Yes Yes  Comment - - - - fell in ice  Number falls in past yr: 2 or more - - 2 or more -  Injury with Fall? No - - - -  Follow up Education provided - - - -     Depression Screen PHQ 2/9 Scores 02/01/2018 04/27/2017 03/14/2016 02/17/2016  PHQ - 2 Score  0 0 0 0    Denies depression but has anxiety  Cognitive Function Ad8 score reviewed for issues:  Issues making decisions:  Less interest in hobbies / activities:  Repeats questions, stories (family complaining):  Trouble using ordinary gadgets (microwave, computer, phone):  Forgets the month or year:   Mismanaging finances:   Remembering appts:  Daily problems with thinking  and/or memory: Ad8 score is=0          Immunization History  Administered Date(s) Administered  . Influenza Split 07/02/2012, 07/22/2013  . Influenza,inj,Quad PF,6+ Mos 06/16/2014, 07/26/2016, 07/06/2017  . Pneumococcal Polysaccharide-23 04/27/2017  . Tdap 04/08/2008     Screening Tests Health Maintenance  Topic Date Due  . OPHTHALMOLOGY EXAM  07/26/1961  . FOOT EXAM  01/25/2018  . URINE MICROALBUMIN  01/25/2018  . HEMOGLOBIN A1C  03/07/2018  . TETANUS/TDAP  04/08/2018  . INFLUENZA VACCINE  05/09/2018  . COLONOSCOPY  11/11/2018  . MAMMOGRAM  08/09/2019  . PAP SMEAR  04/27/2020  . Hepatitis C Screening  Completed  . HIV Screening  Completed       Plan:      PCP Notes   Health Maintenance Postpone shingrix due to steroids per her MD  Foot exam WNL Micro-albumin to get today   Educated regarding prediabetes and diabetes, numbers; metabolic changes etc  Sent education home on foot care as well   Colonoscopy 11/2013; we have 5 years and she was told 7 years  Mammogram 07/2017 - annual   [name] to request Eye exam from [facility]    Abnormal Screens  BMI but is losing weight  Given chair exercise she can do at home by TV   Referrals none   Patient concerns; Getting better and taking are of herself   Nurse Concerns; As noted  Next PCP apt 05/13/2018   I have personally reviewed and noted the following in the patient's chart:   . Medical and social history . Use of alcohol, tobacco or illicit drugs  . Current medications and supplements . Functional ability and status . Nutritional status . Physical activity . Advanced directives . List of other physicians . Hospitalizations, surgeries, and ER visits in previous 12 months . Vitals . Screenings to include cognitive, depression, and falls . Referrals and appointments  In addition, I have reviewed and discussed with patient certain preventive protocols, quality metrics, and best practice  recommendations. A written personalized care plan for preventive services as well as general preventive health recommendations were provided to patient.     Wynetta Fines, RN  02/01/2018

## 2018-02-04 NOTE — Progress Notes (Signed)
I have reviewed documentation from this visit and I agree with recommendations given.  [name] G. Aldan Camey, MD  [facility]. [place] office.   

## 2018-02-08 ENCOUNTER — Telehealth: Payer: Self-pay

## 2018-02-08 NOTE — Telephone Encounter (Signed)
fup with [name] for eye exam  Sh

## 2018-02-15 NOTE — Telephone Encounter (Signed)
Call to [facility] eye and will fax over [name] last eye exam from August 2018. [name] h

## 2018-02-22 ENCOUNTER — Encounter: Payer: Self-pay | Admitting: Family Medicine

## 2018-02-22 NOTE — Telephone Encounter (Signed)
Completed task regarding fup for eye report Therapist, art

## 2018-03-22 ENCOUNTER — Other Ambulatory Visit: Payer: Self-pay | Admitting: Family Medicine

## 2018-03-22 ENCOUNTER — Telehealth: Payer: Self-pay | Admitting: Family Medicine

## 2018-03-22 NOTE — Telephone Encounter (Signed)
Copied from [place] [id number]. Topic: Quick Communication - Rx Refill/Question >> Mar 22, 2018  3:11 PM Scherrie Gerlach wrote: Medication: hydrochlorothiazide (HYDRODIURIL) 25 MG tablet  Pt states she has called pharmacy all week and had not heard anything  [place] ([place] [place] [facility] [place] [phone] (Phone) [phone] (Fax)

## 2018-03-22 NOTE — Telephone Encounter (Signed)
Medication filled on 03/22/18

## 2018-03-25 ENCOUNTER — Encounter: Payer: Self-pay | Admitting: Family Medicine

## 2018-03-25 ENCOUNTER — Ambulatory Visit (INDEPENDENT_AMBULATORY_CARE_PROVIDER_SITE_OTHER): Payer: BC Managed Care – PPO | Admitting: Family Medicine

## 2018-03-25 VITALS — BP 126/84 | HR 60 | Temp 97.8°F | Resp 12 | Ht 62.0 in | Wt 242.0 lb

## 2018-03-25 DIAGNOSIS — M25571 Pain in right ankle and joints of right foot: Secondary | ICD-10-CM

## 2018-03-25 DIAGNOSIS — R197 Diarrhea, unspecified: Secondary | ICD-10-CM

## 2018-03-25 DIAGNOSIS — M25572 Pain in left ankle and joints of left foot: Secondary | ICD-10-CM | POA: Diagnosis not present

## 2018-03-25 DIAGNOSIS — R110 Nausea: Secondary | ICD-10-CM

## 2018-03-25 MED ORDER — ONDANSETRON HCL 4 MG PO TABS: 4.0000 mg | ORAL_TABLET | Freq: Three times a day (TID) | ORAL | 0 refills | Status: AC | PRN

## 2018-03-25 NOTE — Progress Notes (Signed)
ACUTE VISIT   HPI:  Chief Complaint  Patient presents with  . Foot Pain    bilateral  . Diarrhea    having BM when sneezing, possible stomach virus    [name] is a 67 y.o. female, who is here today complaining of feet pain,toes in left and ankle in right.. This problem is not new. States that she keeps "injuring", twisting right ankle.  History of chronic low back pain with radiculopathy and OA.  She has not noted local edema or erythema. She is currently on hydrocodone-acetaminophen 5-325 mg 4 times daily as needed.  She has not tried other OTC medication.  "Stomach virus." She is also complaining of intermittent episodes of diarrhea.  3 weeks ago was the fist episode,recovered.  She has been exposed to people with "GI bugs" She started with diarrhea again 6 days ago.  She is having about 1-2 stools daily, usually defer stool in the morning is loose.  Stool incontinence.when sneezing.   No urinary symptoms. She has mild nausea,no abdominal pain on vomiting.  Some bloating sensation. She has not identified exacerbating or alleviating factors.  No fever,chills,or worsening arthralgias/myalgia. Problem is irritating her hemorrhoids, she has not noted rectal bleeding.  She has tried [employer] helps with nausea. Imodium x 2, caused constipation.   History of IBS, alternate between constipation and diarrhea. She is currently on Miralax.  Colonoscopy in 11/2013.   Review of Systems  Constitutional: Positive for fatigue. Negative for appetite change, chills and fever.  HENT: Negative for rhinorrhea and sore throat.   Respiratory: Negative for shortness of breath and wheezing.   Cardiovascular: Negative for chest pain, palpitations and leg swelling.  Gastrointestinal: Positive for diarrhea. Negative for nausea and vomiting.  Genitourinary: Negative for decreased urine volume and hematuria.  Musculoskeletal: Positive for  arthralgias and back pain. Negative for gait problem and joint swelling.  Skin: Negative for rash and wound.  Neurological: Negative for syncope, weakness and headaches.  Psychiatric/Behavioral: Negative for confusion. The patient is nervous/anxious.       Current Outpatient Medications on File Prior to Visit  Medication Sig Dispense Refill  . Aspirin-Caffeine 845-65 MG PACK Take 1-2 packets by mouth daily as needed (headache).    Marland Kitchen atenolol (TENORMIN) 25 MG tablet Take 1 tablet (25 mg total) by mouth daily. 90 tablet 3  . azelastine (ASTELIN) 0.1 % nasal spray Place 2 sprays into both nostrils 2 (two) times daily. Use in each nostril as directed 30 mL 11  . calcium carbonate (TUMS EX) 750 MG chewable tablet Chew 2 tablets by mouth daily as needed for heartburn.    . cyclobenzaprine (FLEXERIL) 10 MG tablet Take 2.5 mg by mouth daily as needed (pain level 8 or greater).     Marland Kitchen dimenhyDRINATE (DRAMAMINE) 50 MG tablet Take 50 mg by mouth every 8 (eight) hours as needed for nausea. Reported on 01/12/2016    . diphenhydrAMINE (BENADRYL) 25 MG tablet Take 25 mg by mouth daily as needed.    . fexofenadine (ALLEGRA) 60 MG tablet Take 60 mg by mouth daily.    . fluticasone (FLONASE) 50 MCG/ACT nasal spray USE TWO SPRAY (S) IN EACH NOSTRIL ONCE DAILY. 48 g 3  . guaiFENesin (MUCINEX) 600 MG 12 hr tablet Take 300 mg by mouth daily.    . hydrochlorothiazide (HYDRODIURIL) 25 MG tablet TAKE 1 TABLET BY MOUTH ONCE DAILY 90 tablet 1  . HYDROcodone-acetaminophen (NORCO/VICODIN) 5-325 MG tablet Take 0.5 tablets  by mouth daily as needed (pain level 8 or greater). 30 tablet 0  . hydrocortisone 2.5 % ointment APPLY TWICE A DAY 60 g 11  . hydrOXYzine (ATARAX/VISTARIL) 25 MG tablet Take 1 tablet (25 mg total) by mouth 2 (two) times daily as needed for itching. 20 tablet 0  . LORazepam (ATIVAN) 0.5 MG tablet Take 1 tablet (0.5 mg total) by mouth daily as needed for anxiety. 20 tablet 1  . Multiple Vitamins-Minerals  (MULTI-VITAMIN GUMMIES PO) Take by mouth as needed.    Marland Kitchen omeprazole (PRILOSEC) 40 MG capsule Take 40 mg by mouth daily.    . permethrin (ELIMITE) 5 % cream Apply cream from neck to feet, leave on 8-14 hours then wash with soap and water.  Repeat in 2 weeks if needed. 60 g 1  . Polyethylene Glycol 3350 (MIRALAX PO) Take by mouth.    . potassium chloride (K-DUR) 10 MEQ tablet Take 2 tablets (20 mEq total) by mouth daily. 180 tablet 3  . pravastatin (PRAVACHOL) 20 MG tablet Take 20 mg by mouth 3 (three) times daily.    . predniSONE (STERAPRED UNI-PAK 21 TAB) 5 MG (21) TBPK tablet See admin instructions.  0  . amLODipine (NORVASC) 5 MG tablet Take 1 tablet (5 mg total) by mouth daily. 180 tablet 3   No current facility-administered medications on file prior to visit.      Past Medical History:  Diagnosis Date  . Abdominal pain, epigastric 12/28/2008   Centricity Description: ABDOMINAL PAIN, EPIGASTRIC Qualifier: Diagnosis of  By: [name], [name]   Centricity Description: ABDOMINAL PAIN-EPIGASTRIC Qualifier: Diagnosis of  By: [name], [name]   . Allergic rhinitis 09/16/2013  . Allergy   . Anemia   . Anxiety   . Anxiety disorder, unspecified 07/06/2017  . Arthritis   . Blood in stool   . BMI 45.0-49.9, adult ([place]) 07/26/2016  . Class 3 obesity with serious comorbidity and body mass index (BMI) of 45.0 to 49.9 in adult 03/07/2017  . DDD (degenerative disc disease), lumbar   . DIVERTICULOSIS-COLON 12/28/2008   Qualifier: Diagnosis of  By: [name], [name]   . GERD 12/28/2008   Qualifier: Diagnosis of  By: [name], [name]   . GERD (gastroesophageal reflux disease)   . Headache   . Hyperglycemia 03/17/2014  . Hypertension   . Hypokalemia 07/26/2016  . IBS (irritable bowel syndrome)-C-D 07/26/2016  . Obesity   . Other and unspecified hyperlipidemia 03/17/2014  . PERSONAL HX COLONIC POLYPS 12/28/2008   Qualifier: Diagnosis of  By: [name], [name]   . Reflux  esophagitis 12/28/2008   Qualifier: Diagnosis of  By: Julaine Hua CMA (AAMA), [name]    . S/P lumbar spinal fusion 07/02/2012   Allergies  Allergen Reactions  . Crestor [Rosuvastatin]   . Erythromycin     rash  . Lansoprazole   . Penicillins     rash  . Potassium-Containing Compounds Other (See Comments)    Stomach upset   . Pravastatin   . Robaxin [Methocarbamol]     Messed up stomach  . Sulfonamide Derivatives     rash  . Trental [Pentoxifylline Er]     Bad headache    Social History   Socioeconomic History  . Marital status: Widowed    Spouse name: Not on file  . Number of children: Not on file  . Years of education: Not on file  . Highest education level: Not on file  Occupational History  .  Not on file  Social Needs  . Financial resource strain: Not on file  . Food insecurity:    Worry: Not on file    Inability: Not on file  . Transportation needs:    Medical: Not on file    Non-medical: Not on file  Tobacco Use  . Smoking status: Former Smoker    Packs/day: 1.00    Years: 38.00    Pack years: 38.00    Types: Cigarettes    Last attempt to quit: 10/28/1997    Years since quitting: 20.4  . Smokeless tobacco: Never Used  Substance and Sexual Activity  . Alcohol use: No  . Drug use: No  . Sexual activity: Never  Lifestyle  . Physical activity:    Days per week: Not on file    Minutes per session: Not on file  . Stress: Not on file  Relationships  . Social connections:    Talks on phone: Not on file    Gets together: Not on file    Attends religious service: Not on file    Active member of club or organization: Not on file    Attends meetings of clubs or organizations: Not on file    Relationship status: Not on file  Other Topics Concern  . 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    Vitals:   03/25/18 0743  BP: 126/84  Pulse: 60  Resp: 12  Temp: 97.8 F (36.6 C)  SpO2: 98%   Body mass index is 44.26  kg/m.   Physical Exam  Nursing note and vitals reviewed. Constitutional: She is oriented to person, place, and time. She appears well-developed. She does not appear ill. No distress.  HENT:  Head: Normocephalic and atraumatic.  Eyes: Pupils are equal, round, and reactive to light. Conjunctivae are normal.  Cardiovascular: Normal rate and regular rhythm.  No murmur heard. Pulses:      Dorsalis pedis pulses are 2+ on the right side, and 2+ on the left side.  Respiratory: Effort normal and breath sounds normal. No respiratory distress.  GI: Soft. Bowel sounds are normal. She exhibits no mass. There is no hepatomegaly. There is tenderness in the right lower quadrant. There is no rigidity, no rebound and no guarding.  Musculoskeletal: She exhibits edema (Trace pitting LE edema bilateral).       Right ankle: She exhibits normal range of motion, no swelling, no deformity and normal pulse. Tenderness. Lateral malleolus tenderness found.       Left foot: There is normal range of motion, no tenderness and no bony tenderness.  Tenderness upon palpation on with range of motion of lateral malleolus. No bony tenderness. No edema or erythema. Right foot: No significant deformity, no limitation of range of motion, no tenderness upon palpation.  No signs of synovitis.  Lymphadenopathy:    She has no cervical adenopathy.  Neurological: She is alert and oriented to person, place, and time. She has normal strength. Coordination normal.  Skin: Skin is warm. No rash noted. No erythema.  Psychiatric: Her speech is normal. Her mood appears anxious.  Well groomed, good eye contact.     ASSESSMENT AND PLAN:   [name] was seen today for foot pain and diarrhea.  Diagnoses and all orders for this visit:  Nausea without vomiting  Symptomatic treatment with Zofran recommended for now. Small frequent sips of clear fluids. Follow-up as needed.  -     ondansetron (ZOFRAN)  4 MG tablet; Take 1 tablet  (4 mg total) by mouth every 8 (eight) hours as needed for up to 5 days for nausea or vomiting.  Diarrhea, unspecified type  It seems like she is been exposed to GI viral illnesses. This could also be related to her IBS history. Recommend stopping MiraLAX. We discussed some side effects of Imodium, no recommended because it causes constipation. Adequate hydration, clear fluids. Instructed about warning signs. Follow-up as needed.  Right ankle pain, unspecified chronicity  ?  Ankle sprain. Because no history of direct trauma and no bony tenderness I do not think imaging is needed today. For now I recommend OTC IcyHot with lidocaine and ABC range of motion exercises. Also recommend wearing shoes with ankle support or/and OTC ankle brace as needed. Follow-up as needed. Arthralgia of foot, left  Most likely related to OA. Examination today otherwise negative. Recommend wearing comfortable shoes. Follow-up with podiatrist if problem is persistent.      Return if symptoms worsen or fail to improve.     Hernandez Losasso G. [name], MD  [facility]. [place] office.

## 2018-03-25 NOTE — Patient Instructions (Signed)
A few things to remember from today's visit:   Nausea without vomiting  Diarrhea, unspecified type  Right ankle pain, unspecified chronicity  Arthralgia of foot, left  ABC PT right ankle. Toes pain ? Osteoarthritis.  Stop Miralax.  Adequate fiber intake and fluid intake.     Please be sure medication list is accurate. If a new problem present, please set up appointment sooner than planned today.

## 2018-04-12 ENCOUNTER — Other Ambulatory Visit: Payer: Self-pay | Admitting: Family Medicine

## 2018-05-13 ENCOUNTER — Ambulatory Visit: Payer: Self-pay | Admitting: Family Medicine

## 2018-05-14 NOTE — Progress Notes (Signed)
[name] is a 67 y.o.female, who is here today to follow some of her chronic health problems. Hypertension: Currently she is on amlodipine 5 mg, HCTZ 25 mg, and atenolol 25 mg daily.  She is taking medications as instructed, no side effects reported.  She has not noted unusual headache, visual changes, exertional chest pain, dyspnea,  focal weakness, or edema.    Lab Results  Component Value Date   CREATININE 0.87 01/11/2018   BUN 18 01/11/2018   NA 142 01/11/2018   K 3.6 01/11/2018   CL 102 01/11/2018   CO2 30 01/11/2018    HypoK+: She is on KClo 20 meq daily, recently dose was adjusted.  LE edema, bilateral, chronic and mildly worse recently.She attributes this to hot weather, worse at the end of the day and better in the morning.It seems to be getting better. No erythema or rash.   Diabetes Mellitus II:  Currently on nonpharmacologic treatment.  Checking BS's : Not checking.  Negative for polydipsia, polyuria, or polyphagia. No feet numbness, tingling, or burning.  Lab Results  Component Value Date   HGBA1C 6.7 (H) 09/07/2017   Lab Results  Component Value Date   MICROALBUR <0.7 02/01/2018   Lower extremitiy pain mainly when lying on her back at night. L>R. Some pain also with prolonged walking, > 15 min. She wonders if this is related to her veins. She has Hx of chronic back pain, s/p lumbar spine fusion.  No numbness or tingling. No saddle anesthesia or changes in urine/bowel continence.  She is also concerned because she noted "knot" on left thigh, tender at night, "pinching" pain sensation,8/10. She is applying topical asper cream.   She is also concerned about urine looking like "chocolate", brown or green urine,mainly in the morning. No changes in stool color. Negative for dysuria,increased frequency,or gross hematuria.  Lower abdominal pressure sensation after urination. Hx of IBS No changes in bowel habits.    Review  of Systems  Constitutional: Negative for activity change, appetite change, fatigue and fever.  HENT: Negative for mouth sores, nosebleeds and trouble swallowing.   Eyes: Negative for redness and visual disturbance.  Respiratory: Negative for cough, shortness of breath and wheezing.   Cardiovascular: Positive for leg swelling. Negative for chest pain and palpitations.  Gastrointestinal: Negative for abdominal pain, nausea and vomiting.       Negative for changes in bowel habits.  Endocrine: Negative for polydipsia, polyphagia and polyuria.  Genitourinary: Negative for decreased urine volume, difficulty urinating, dysuria and hematuria.  Musculoskeletal: Positive for back pain and myalgias. Negative for gait problem.  Skin: Negative for color change, rash and wound.  Allergic/Immunologic: Positive for environmental allergies.  Neurological: Negative for syncope, weakness and headaches.  Psychiatric/Behavioral: Positive for sleep disturbance. Negative for confusion. The patient is nervous/anxious.      Current Outpatient Medications on File Prior to Visit  Medication Sig Dispense Refill  . Aspirin-Caffeine 845-65 MG PACK Take 1-2 packets by mouth daily as needed (headache).    Marland Kitchen atenolol (TENORMIN) 25 MG tablet Take 1 tablet (25 mg total) by mouth daily. 90 tablet 3  . azelastine (ASTELIN) 0.1 % nasal spray Place 2 sprays into both nostrils 2 (two) times daily. Use in each nostril as directed 30 mL 11  . calcium carbonate (TUMS EX) 750 MG chewable tablet Chew 2 tablets by mouth daily as needed for heartburn.    . cyclobenzaprine (FLEXERIL) 10 MG tablet Take 2.5 mg by mouth daily  as needed (pain level 8 or greater).     Marland Kitchen dimenhyDRINATE (DRAMAMINE) 50 MG tablet Take 50 mg by mouth every 8 (eight) hours as needed for nausea. Reported on 01/12/2016    . diphenhydrAMINE (BENADRYL) 25 MG tablet Take 25 mg by mouth daily as needed.    . fexofenadine (ALLEGRA) 60 MG tablet Take 60 mg by mouth daily.     . fluticasone (FLONASE) 50 MCG/ACT nasal spray USE TWO SPRAY (S) IN EACH NOSTRIL ONCE DAILY. 48 g 3  . guaiFENesin (MUCINEX) 600 MG 12 hr tablet Take 300 mg by mouth daily.    . hydrochlorothiazide (HYDRODIURIL) 25 MG tablet TAKE 1 TABLET BY MOUTH ONCE DAILY 90 tablet 1  . HYDROcodone-acetaminophen (NORCO/VICODIN) 5-325 MG tablet Take 0.5 tablets by mouth daily as needed (pain level 8 or greater). 30 tablet 0  . hydrocortisone 2.5 % ointment APPLY TWICE A DAY 60 g 11  . LORazepam (ATIVAN) 0.5 MG tablet Take 1 tablet (0.5 mg total) by mouth daily as needed for anxiety. 20 tablet 1  . Multiple Vitamins-Minerals (MULTI-VITAMIN GUMMIES PO) Take by mouth as needed.    Marland Kitchen omeprazole (PRILOSEC) 40 MG capsule Take 40 mg by mouth daily.    . permethrin (ELIMITE) 5 % cream Apply cream from neck to feet, leave on 8-14 hours then wash with soap and water.  Repeat in 2 weeks if needed. 60 g 1  . Polyethylene Glycol 3350 (MIRALAX PO) Take by mouth.    . potassium chloride (K-DUR) 10 MEQ tablet Take 2 tablets (20 mEq total) by mouth daily. 180 tablet 3  . pravastatin (PRAVACHOL) 20 MG tablet TAKE 1 TABLET BY MOUTH ONCE DAILY 90 tablet 3  . predniSONE (STERAPRED UNI-PAK 21 TAB) 5 MG (21) TBPK tablet See admin instructions.  0  . amLODipine (NORVASC) 5 MG tablet Take 1 tablet (5 mg total) by mouth daily. 180 tablet 3   No current facility-administered medications on file prior to visit.      Past Medical History:  Diagnosis Date  . Abdominal pain, epigastric 12/28/2008   Centricity Description: ABDOMINAL PAIN, EPIGASTRIC Qualifier: Diagnosis of  By: [name], [name]   Centricity Description: ABDOMINAL PAIN-EPIGASTRIC Qualifier: Diagnosis of  By: [name], [name]   . Allergic rhinitis 09/16/2013  . Allergy   . Anemia   . Anxiety   . Anxiety disorder, unspecified 07/06/2017  . Arthritis   . Blood in stool   . BMI 45.0-49.9, adult ([place]) 07/26/2016  . Class 3 obesity with serious comorbidity  and body mass index (BMI) of 45.0 to 49.9 in adult 03/07/2017  . DDD (degenerative disc disease), lumbar   . DIVERTICULOSIS-COLON 12/28/2008   Qualifier: Diagnosis of  By: [name], [name]   . GERD 12/28/2008   Qualifier: Diagnosis of  By: [name], [name]   . GERD (gastroesophageal reflux disease)   . Headache   . Hyperglycemia 03/17/2014  . Hypertension   . Hypokalemia 07/26/2016  . IBS (irritable bowel syndrome)-C-D 07/26/2016  . Obesity   . Other and unspecified hyperlipidemia 03/17/2014  . PERSONAL HX COLONIC POLYPS 12/28/2008   Qualifier: Diagnosis of  By: [name], [name]   . Reflux esophagitis 12/28/2008   Qualifier: Diagnosis of  By: Julaine Hua CMA (AAMA), [name]    . S/P lumbar spinal fusion 07/02/2012    Past Surgical History:  Procedure Laterality Date  . ABDOMINAL HYSTERECTOMY    . COLONOSCOPY  2014  . laser surgery to  remove scar     not sure of the year; after hyst  . RADIOLOGY WITH ANESTHESIA N/A 02/25/2015   Procedure: MRI OF LUMBAR SPINE;  Surgeon: Medication Radiologist, MD;  Location: [facility];  Service: Radiology;  Laterality: N/A;  DR. MAX COHEN/MRI  . SPINE SURGERY    . TONSILLECTOMY AND ADENOIDECTOMY      Allergies  Allergen Reactions  . Crestor [Rosuvastatin]   . Erythromycin     rash  . Lansoprazole   . Penicillins     rash  . Potassium-Containing Compounds Other (See Comments)    Stomach upset   . Pravastatin   . Robaxin [Methocarbamol]     Messed up stomach  . Sulfonamide Derivatives     rash  . Trental [Pentoxifylline Er]     Bad headache    Social History   Socioeconomic History  . Marital status: Widowed    Spouse name: Not on file  . Number of children: Not on file  . Years of education: Not on file  . Highest education level: Not on file  Occupational History  . Not on file  Social Needs  . Financial resource strain: Not on file  . Food insecurity:    Worry: Not on file    Inability: Not on file  . Transportation  needs:    Medical: Not on file    Non-medical: Not on file  Tobacco Use  . Smoking status: Former Smoker    Packs/day: 1.00    Years: 38.00    Pack years: 38.00    Types: Cigarettes    Last attempt to quit: 10/28/1997    Years since quitting: 20.5  . Smokeless tobacco: Never Used  Substance and Sexual Activity  . Alcohol use: No  . Drug use: No  . Sexual activity: Never  Lifestyle  . Physical activity:    Days per week: Not on file    Minutes per session: Not on file  . Stress: Not on file  Relationships  . Social connections:    Talks on phone: Not on file    Gets together: Not on file    Attends religious service: Not on file    Active member of club or organization: Not on file    Attends meetings of clubs or organizations: Not on file    Relationship status: Not on file  Other Topics Concern  . 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    Vitals:   05/15/18 0744  BP: 130/82  Pulse: 60  Resp: 12  Temp: 97.9 F (36.6 C)  SpO2: 97%   Body mass index is 44.26 kg/m.    Physical Exam  Nursing note and vitals reviewed. Constitutional: She is oriented to person, place, and time. She appears well-developed. No distress.  HENT:  Head: Normocephalic and atraumatic.  Mouth/Throat: Oropharynx is clear and moist and mucous membranes are normal.  Eyes: Pupils are equal, round, and reactive to light. Conjunctivae are normal.  Cardiovascular: Normal rate. An irregular rhythm present.  No murmur heard. Pulses:      Dorsalis pedis pulses are 2+ on the right side, and 2+ on the left side.  Varicose veins LE, bilateral.  No ulcer or tortuous veins appreciated.  Respiratory: Effort normal and breath sounds normal. No respiratory distress.  GI: Soft. She exhibits no mass. There is no hepatomegaly. There is no tenderness. There is no CVA tenderness.  Musculoskeletal: She exhibits no edema.  Lumbar back: She exhibits no tenderness  and no bony tenderness.       Legs: Lymphadenopathy:    She has no cervical adenopathy.  Neurological: She is alert and oriented to person, place, and time. She has normal strength. No cranial nerve deficit. Gait normal.  Skin: Skin is warm. No rash noted. No erythema.  Inner upper aspect of left thigh with 2-3 mm defined lesion,not tender and no erythema. Superficial and mildly raise. See MS graphic.  Psychiatric: Her mood appears anxious.  Well groomed, good eye contact.    ASSESSMENT AND PLAN:   [name] was seen today for follow-up.  Orders Placed This Encounter  Procedures  . Basic metabolic panel  . Urinalysis with Culture Reflex  . REFLEXIVE URINE CULTURE  . Ambulatory referral to Vascular Surgery  . POCT glycosylated hemoglobin (Hb A1C)     Lab Results  Component Value Date   HGBA1C 5.9 (A) 05/15/2018   Controlled type 2 diabetes mellitus without complication, without long-term current use of insulin ([place])  HgA1C at goal. Continue non pharmacologic treatment. Regular exercise as tolerated and healthy diet with avoidance of added sugar food intake is an important part of treatment and recommended. Annual eye exam, periodic dental and foot care recommended. F/U in 5-6 months  -     POCT glycosylated hemoglobin (Hb A1C)  Suprapubic abdominal pain  Examination negative today. It could be related to IBS. Instructed about warning signs.  -     Basic metabolic panel -     Urinalysis with Culture Reflex  Essential hypertension  Well controlled. Continue low salt diet. No changes in current management. F/U in 6 months.  Symptomatic spider varicose vein -     Ambulatory referral to Vascular Surgery  Irritable bowel syndrome with constipation  Adequate fiber and fluid intake. Milalax daily prn.  Brown-colored urine  Urine dipstick negative. No other urinary symptom. ? Dehydration.  Further recommendations will be given according to lab  results.  -     Urinalysis with Culture Reflex -     REFLEXIVE URINE CULTURE   Pain in both lower extremities Varicose vein could be causing pain with ambulation but denies pain could be related to lumbar degenerative changes,?  Radicular pain. Recommend continue to follow with orthopedist.  Hypokalemia  No changes in current management, will follow labs done today and will give further recommendations accordingly.   40 min face to face OV. > 50% was dedicated to discussion of differential Dx, prognosis, treatment options, and some side effects of medications.She has several concerns , she needs a lot of reassurance.     Decoda Van G. [name], MD  [facility]. [place] office.

## 2018-05-15 ENCOUNTER — Ambulatory Visit (INDEPENDENT_AMBULATORY_CARE_PROVIDER_SITE_OTHER): Payer: BC Managed Care – PPO | Admitting: Family Medicine

## 2018-05-15 ENCOUNTER — Encounter: Payer: Self-pay | Admitting: Family Medicine

## 2018-05-15 VITALS — BP 130/82 | HR 60 | Temp 97.9°F | Resp 12 | Ht 62.0 in | Wt 242.0 lb

## 2018-05-15 DIAGNOSIS — E876 Hypokalemia: Secondary | ICD-10-CM

## 2018-05-15 DIAGNOSIS — M79604 Pain in right leg: Secondary | ICD-10-CM

## 2018-05-15 DIAGNOSIS — R82998 Other abnormal findings in urine: Secondary | ICD-10-CM | POA: Diagnosis not present

## 2018-05-15 DIAGNOSIS — I10 Essential (primary) hypertension: Secondary | ICD-10-CM

## 2018-05-15 DIAGNOSIS — M79605 Pain in left leg: Secondary | ICD-10-CM | POA: Diagnosis not present

## 2018-05-15 DIAGNOSIS — R102 Pelvic and perineal pain: Secondary | ICD-10-CM

## 2018-05-15 DIAGNOSIS — K581 Irritable bowel syndrome with constipation: Secondary | ICD-10-CM

## 2018-05-15 DIAGNOSIS — I83899 Varicose veins of unspecified lower extremities with other complications: Secondary | ICD-10-CM | POA: Insufficient documentation

## 2018-05-15 DIAGNOSIS — E119 Type 2 diabetes mellitus without complications: Secondary | ICD-10-CM

## 2018-05-15 LAB — BASIC METABOLIC PANEL
BUN: 16 mg/dL (ref 6–23)
CHLORIDE: 100 meq/L (ref 96–112)
CO2: 33 meq/L — AB (ref 19–32)
Calcium: 10.2 mg/dL (ref 8.4–10.5)
Creatinine, Ser: 0.91 mg/dL (ref 0.40–1.20)
GFR: 79.84 mL/min (ref >60.00)
GLUCOSE: 119 mg/dL — AB (ref 70–99)
POTASSIUM: 3.8 meq/L (ref 3.5–5.1)
Sodium: 142 mEq/L (ref 135–145)

## 2018-05-15 LAB — POCT GLYCOSYLATED HEMOGLOBIN (HGB A1C): Hemoglobin A1C: 5.9 % — AB (ref 4.0–5.6)

## 2018-05-15 NOTE — Assessment & Plan Note (Signed)
HgA1C at goal. Continue nonpharmacologic treatment Regular exercise and healthy diet with avoidance of added sugar food intake is an important part of treatment and recommended. Annual eye exam and foot care recommended. F/U in 6 months

## 2018-05-15 NOTE — Assessment & Plan Note (Addendum)
This problem could be contributing to her lower abdominal pain. Continue adequate fiber and fluid intake.

## 2018-05-15 NOTE — Patient Instructions (Addendum)
A few things to remember from today's visit:   Controlled type 2 diabetes mellitus without complication, without long-term current use of insulin ([place]) - Plan: POCT glycosylated hemoglobin (Hb A1C)  Suprapubic abdominal pain - Plan: Basic metabolic panel, Urinalysis with Culture Reflex  Essential hypertension  Symptomatic spider varicose vein - Plan: Ambulatory referral to Vascular Surgery   Please be sure medication list is accurate. If a new problem present, please set up appointment sooner than planned today.

## 2018-05-15 NOTE — Assessment & Plan Note (Signed)
Adequately controlled. No changes in current management. Continue low salt diet. Eye exam recommended annually. F/U in 6 months, before if needed.  

## 2018-05-16 LAB — URINALYSIS W MICROSCOPIC + REFLEX CULTURE
Bacteria, UA: NONE SEEN /HPF
Bilirubin Urine: NEGATIVE
GLUCOSE, UA: NEGATIVE
HGB URINE DIPSTICK: NEGATIVE
HYALINE CAST: NONE SEEN /LPF
Ketones, ur: NEGATIVE
Leukocyte Esterase: NEGATIVE
Nitrites, Initial: NEGATIVE
PROTEIN: NEGATIVE
RBC / HPF: NONE SEEN /HPF (ref 0–2)
Specific Gravity, Urine: 1.02 (ref 1.001–1.03)
WBC UA: NONE SEEN /HPF (ref 0–5)
pH: 6.5 (ref 5.0–8.0)

## 2018-05-16 LAB — NO CULTURE INDICATED

## 2018-05-19 ENCOUNTER — Encounter: Payer: Self-pay | Admitting: Family Medicine

## 2018-05-22 ENCOUNTER — Encounter: Payer: Self-pay | Admitting: Vascular Surgery

## 2018-05-22 ENCOUNTER — Other Ambulatory Visit: Payer: Self-pay

## 2018-05-22 ENCOUNTER — Ambulatory Visit (INDEPENDENT_AMBULATORY_CARE_PROVIDER_SITE_OTHER): Payer: BC Managed Care – PPO | Admitting: Vascular Surgery

## 2018-05-22 VITALS — BP 142/88 | HR 63 | Temp 97.8°F | Resp 18 | Ht 61.75 in | Wt 237.6 lb

## 2018-05-22 DIAGNOSIS — I83813 Varicose veins of bilateral lower extremities with pain: Secondary | ICD-10-CM

## 2018-05-22 NOTE — Progress Notes (Signed)
Referring Physician: Dr Betty [name]  Patient name: [name] MRN: [id number] DOB: Dec 06, 1950 Sex: female  REASON FOR CONSULT: Pain and swelling from varicose veins left greater than right  HPI: [name] is a 67 y.o. female, with several year history of pain primarily behind her left knee but some behind the right and extending up into the hamstring area which comes randomly occur at nighttime or during the day.  She also has problems with leg swelling.  She has worn some calf length compression stockings in the past but this did not really relieve her symptoms.  She occasionally takes diuretics to try to decrease her swelling this helps but then when she drinks more water the symptoms return.  She denies any prior history of DVT.  She has no family history of varicose veins.  She has had previous back surgery and she states that the pain that she had in her back and legs improved after this.  Other medical problems include obesity, hypertension, currently stable.  Past Medical History:  Diagnosis Date  . Abdominal pain, epigastric 12/28/2008   Centricity Description: ABDOMINAL PAIN, EPIGASTRIC Qualifier: Diagnosis of  By: [name], [name]   Centricity Description: ABDOMINAL PAIN-EPIGASTRIC Qualifier: Diagnosis of  By: [name], [name]   . Allergic rhinitis 09/16/2013  . Allergy   . Anemia   . Anxiety   . Anxiety disorder, unspecified 07/06/2017  . Arthritis   . Blood in stool   . BMI 45.0-49.9, adult ([place]) 07/26/2016  . Class 3 obesity with serious comorbidity and body mass index (BMI) of 45.0 to 49.9 in adult 03/07/2017  . DDD (degenerative disc disease), lumbar   . DIVERTICULOSIS-COLON 12/28/2008   Qualifier: Diagnosis of  By: [name], [name]   . GERD 12/28/2008   Qualifier: Diagnosis of  By: [name], [name]   . GERD (gastroesophageal reflux disease)   . Headache   . Hyperglycemia 03/17/2014  . Hypertension   . Hypokalemia  07/26/2016  . IBS (irritable bowel syndrome)-C-D 07/26/2016  . Obesity   . Other and unspecified hyperlipidemia 03/17/2014  . PERSONAL HX COLONIC POLYPS 12/28/2008   Qualifier: Diagnosis of  By: [name], [name]   . Reflux esophagitis 12/28/2008   Qualifier: Diagnosis of  By: Julaine Hua CMA (AAMA), [name]    . S/P lumbar spinal fusion 07/02/2012   Past Surgical History:  Procedure Laterality Date  . ABDOMINAL HYSTERECTOMY    . COLONOSCOPY  2014  . laser surgery to remove scar     not sure of the year; after hyst  . RADIOLOGY WITH ANESTHESIA N/A 02/25/2015   Procedure: MRI OF LUMBAR SPINE;  Surgeon: Medication Radiologist, MD;  Location: [facility];  Service: Radiology;  Laterality: N/A;  DR. MAX COHEN/MRI  . SPINE SURGERY    . TONSILLECTOMY AND ADENOIDECTOMY      Family History  Problem Relation Age of Onset  . Hypertension Mother   . Hyperlipidemia Mother   . Hyperlipidemia Father   . Hypertension Father   . Arthritis Father   . Hyperlipidemia Sister   . Cervical cancer Sister   . Diabetes Sister   . Heart disease Sister   . Cancer Maternal Grandfather   . Hypertension Paternal Grandmother   . Pancreatic cancer Maternal Aunt   . Breast cancer Paternal Aunt   . Breast cancer Cousin     SOCIAL HISTORY: Social History   Socioeconomic History  . Marital status: Widowed  Spouse name: Not on file  . Number of children: Not on file  . Years of education: Not on file  . Highest education level: Not on file  Occupational History  . Not on file  Social Needs  . Financial resource strain: Not on file  . Food insecurity:    Worry: Not on file    Inability: Not on file  . Transportation needs:    Medical: Not on file    Non-medical: Not on file  Tobacco Use  . Smoking status: Former Smoker    Packs/day: 1.00    Years: 38.00    Pack years: 38.00    Types: Cigarettes    Last attempt to quit: 10/28/1997    Years since quitting: 20.5  . Smokeless tobacco: Never Used    Substance and Sexual Activity  . Alcohol use: No  . Drug use: No  . Sexual activity: Never  Lifestyle  . Physical activity:    Days per week: Not on file    Minutes per session: Not on file  . Stress: Not on file  Relationships  . Social connections:    Talks on phone: Not on file    Gets together: Not on file    Attends religious service: Not on file    Active member of club or organization: Not on file    Attends meetings of clubs or organizations: Not on file    Relationship status: Not on file  . Intimate partner violence:    Fear of current or ex partner: Not on file    Emotionally abused: Not on file    Physically abused: Not on file    Forced sexual activity: Not on file  Other Topics Concern  . 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    Allergies  Allergen Reactions  . Crestor [Rosuvastatin]   . Erythromycin     rash  . Lansoprazole   . Penicillins     rash  . Potassium-Containing Compounds Other (See Comments)    Stomach upset   . Pravastatin   . Robaxin [Methocarbamol]     Messed up stomach  . Sulfonamide Derivatives     rash  . Trental [Pentoxifylline Er]     Bad headache    Current Outpatient Medications  Medication Sig Dispense Refill  . Aspirin-Caffeine 845-65 MG PACK Take 1-2 packets by mouth daily as needed (headache).    Marland Kitchen atenolol (TENORMIN) 25 MG tablet Take 1 tablet (25 mg total) by mouth daily. 90 tablet 3  . azelastine (ASTELIN) 0.1 % nasal spray Place 2 sprays into both nostrils 2 (two) times daily. Use in each nostril as directed 30 mL 11  . calcium carbonate (TUMS EX) 750 MG chewable tablet Chew 2 tablets by mouth daily as needed for heartburn.    . cyclobenzaprine (FLEXERIL) 10 MG tablet Take 2.5 mg by mouth daily as needed (pain level 8 or greater).     Marland Kitchen dimenhyDRINATE (DRAMAMINE) 50 MG tablet Take 50 mg by mouth every 8 (eight) hours as needed for nausea. Reported on 01/12/2016    .  diphenhydrAMINE (BENADRYL) 25 MG tablet Take 25 mg by mouth daily as needed.    . fexofenadine (ALLEGRA) 60 MG tablet Take 60 mg by mouth daily.    . fluticasone (FLONASE) 50 MCG/ACT nasal spray USE TWO SPRAY (S) IN EACH NOSTRIL ONCE DAILY. 48 g 3  . guaiFENesin (MUCINEX) 600 MG 12 hr tablet  Take 300 mg by mouth daily.    . hydrochlorothiazide (HYDRODIURIL) 25 MG tablet TAKE 1 TABLET BY MOUTH ONCE DAILY 90 tablet 1  . HYDROcodone-acetaminophen (NORCO/VICODIN) 5-325 MG tablet Take 0.5 tablets by mouth daily as needed (pain level 8 or greater). 30 tablet 0  . hydrocortisone 2.5 % ointment APPLY TWICE A DAY 60 g 11  . LORazepam (ATIVAN) 0.5 MG tablet Take 1 tablet (0.5 mg total) by mouth daily as needed for anxiety. 20 tablet 1  . Multiple Vitamins-Minerals (MULTI-VITAMIN GUMMIES PO) Take by mouth as needed.    Marland Kitchen omeprazole (PRILOSEC) 40 MG capsule Take 40 mg by mouth daily.    . permethrin (ELIMITE) 5 % cream Apply cream from neck to feet, leave on 8-14 hours then wash with soap and water.  Repeat in 2 weeks if needed. 60 g 1  . Polyethylene Glycol 3350 (MIRALAX PO) Take by mouth.    . potassium chloride (K-DUR) 10 MEQ tablet Take 2 tablets (20 mEq total) by mouth daily. 180 tablet 3  . pravastatin (PRAVACHOL) 20 MG tablet TAKE 1 TABLET BY MOUTH ONCE DAILY 90 tablet 3  . predniSONE (STERAPRED UNI-PAK 21 TAB) 5 MG (21) TBPK tablet See admin instructions.  0  . amLODipine (NORVASC) 5 MG tablet Take 1 tablet (5 mg total) by mouth daily. 180 tablet 3   No current facility-administered medications for this visit.     ROS:   General:  No weight loss, Fever, chills  HEENT: No recent headaches, no nasal bleeding, no visual changes, no sore throat  Neurologic: No dizziness, blackouts, seizures. No recent symptoms of stroke or mini- stroke. No recent episodes of slurred speech, or temporary blindness.  Cardiac: No recent episodes of chest pain/pressure, no shortness of breath at rest.  No shortness  of breath with exertion.  Denies history of atrial fibrillation or irregular heartbeat  Vascular: No history of rest pain in feet.  No history of claudication.  No history of non-healing ulcer, No history of DVT   Pulmonary: No home oxygen, no productive cough, no hemoptysis,  No asthma or wheezing  Musculoskeletal:  [X]  Arthritis, [X]  Low back pain,  [ ]  Joint pain  Hematologic:No history of hypercoagulable state.  No history of easy bleeding.  No history of anemia  Gastrointestinal: No hematochezia or melena,  No gastroesophageal reflux, no trouble swallowing  Urinary: [ ]  chronic Kidney disease, [ ]  on HD - [ ]  MWF or [ ]  TTHS, [ ]  Burning with urination, [ ]  Frequent urination, [ ]  Difficulty urinating;   Skin: No rashes  Psychological: No history of anxiety,  No history of depression   Physical Examination  Vitals:   05/22/18 1608  BP: (!) 142/88  Pulse: 63  Resp: 18  Temp: 97.8 F (36.6 C)  TempSrc: Oral  SpO2: 97%  Weight: 237 lb 9.6 oz (107.8 kg)  Height: 5' 1.75" (1.568 m)    Body mass index is 43.81 kg/m.  General:  Alert and oriented, no acute distress HEENT: Normal Neck: No bruit or JVD Pulmonary: Clear to auscultation bilaterally Cardiac: Regular Rate and Rhythm without murmur Abdomen: Soft, non-tender, non-distended, no mass, Skin: No rash, scattered 1 to 2 mm reticular type varicosities left lateral posterior knee and right lateral thigh just above the knee no obvious palpable varicosities trace pretibial edema no ulceration Extremity Pulses:  2+ radial, brachial, femoral, dorsalis pedis, posterior tibial pulses bilaterally Musculoskeletal: No deformity trace pretibial edema  Neurologic: Upper and lower extremity  motor 5/5 and symmetric   ASSESSMENT: Bilateral leg pain with swelling left greater than right with some reticular and spider type varicosities in both thighs.  Difficult to know whether or not this is still related to her previous back issues  versus related to a vein problem.  She does not have any arterial component.   PLAN: Patient was given bilateral thigh-high 20 to 30 mm compression stockings today.  She will wear these during the daytime daily.  She will return in a few weeks time for a venous reflux exam.  If she has significant reflux we could consider an intervention at that point.   Ruta Hinds, MD [facility] Office: [phone] Pager: [phone]

## 2018-05-31 ENCOUNTER — Other Ambulatory Visit: Payer: Self-pay

## 2018-05-31 DIAGNOSIS — I83813 Varicose veins of bilateral lower extremities with pain: Secondary | ICD-10-CM

## 2018-06-05 NOTE — Progress Notes (Unsigned)
No diabetic retinopathy Eye exam scanned to epic Wynetta Fines RN

## 2018-06-19 ENCOUNTER — Ambulatory Visit (INDEPENDENT_AMBULATORY_CARE_PROVIDER_SITE_OTHER): Payer: BC Managed Care – PPO | Admitting: Vascular Surgery

## 2018-06-19 ENCOUNTER — Encounter: Payer: Self-pay | Admitting: Vascular Surgery

## 2018-06-19 ENCOUNTER — Ambulatory Visit (HOSPITAL_COMMUNITY)
Admission: RE | Admit: 2018-06-19 | Discharge: 2018-06-19 | Disposition: A | Discharge status: Home / Self Care | Payer: BC Managed Care – PPO | Source: Ambulatory Visit | Attending: Vascular Surgery | Admitting: Vascular Surgery

## 2018-06-19 ENCOUNTER — Other Ambulatory Visit: Payer: Self-pay

## 2018-06-19 VITALS — BP 118/75 | HR 96 | Temp 97.0°F | Resp 18 | Ht 60.18 in | Wt 239.9 lb

## 2018-06-19 DIAGNOSIS — I872 Venous insufficiency (chronic) (peripheral): Secondary | ICD-10-CM | POA: Diagnosis not present

## 2018-06-19 DIAGNOSIS — I83813 Varicose veins of bilateral lower extremities with pain: Secondary | ICD-10-CM | POA: Diagnosis not present

## 2018-06-19 NOTE — Progress Notes (Signed)
Patient is a 67 year old female who returns today for further follow-up.  She was previously seen on August 14.  She was complaining primarily of pain behind her left knee and behind the right knee extending up into the posterior hamstring area which randomly occur sometimes at night or during the day.  She occasionally has some leg swelling and has been wearing thigh-high lower extremity compression stockings but has had some trouble fitting them.  She has had prior back surgery and states that the pain that she had in her back and legs improved this for a period of time but it has now returned.  Review of systems: She denies chest pain or shortness of breath.  Physical exam:  Vitals:   06/19/18 1535  BP: 118/75  Pulse: 96  Resp: 18  Temp: (!) 97 F (36.1 C)  TempSrc: Oral  SpO2: 96%  Weight: 239 lb 14.4 oz (108.8 kg)  Height: 5' 0.18" (1.529 m)    Extremities: Scattered small varicosities and spider type varicosities in the thigh and knee area no large palpable varicosities primarily left lateral posterior knee and right lateral thigh just above the knee  Vascular: 2+ dorsalis pedis pulses bilaterally  Data: Patient had a venous duplex exam today which showed no evidence of DVT.  She did have mild reflux in the proximal thigh greater saphenous and at the saphenofemoral junction on the left side.  However vein diameters only about 2-1/2 mm on the right and only about 3 mm in the left  Assessment: Mild superficial venous reflux with scattered spider and reticular type varicosities.  I do not believe that this is the etiology of the patient's current symptoms.  Vein diameter is quite small despite that reflux and I did not believe that she is a candidate currently for laser ablation.  Plan: The patient will follow-up on an as-needed basis if her symptoms worsen over time.  She will continue to wear her lower extremity compression stockings.  She has follow-up scheduled in the near future  with her orthopedic doctor regarding her back issues.

## 2018-06-21 DIAGNOSIS — H2513 Age-related nuclear cataract, bilateral: Secondary | ICD-10-CM | POA: Diagnosis not present

## 2018-06-21 DIAGNOSIS — E119 Type 2 diabetes mellitus without complications: Secondary | ICD-10-CM | POA: Diagnosis not present

## 2018-06-21 LAB — HM DIABETES EYE EXAM

## 2018-06-24 ENCOUNTER — Encounter: Payer: Self-pay | Admitting: Family Medicine

## 2018-07-09 ENCOUNTER — Encounter: Payer: Self-pay | Admitting: Family Medicine

## 2018-07-09 ENCOUNTER — Ambulatory Visit (INDEPENDENT_AMBULATORY_CARE_PROVIDER_SITE_OTHER): Payer: BC Managed Care – PPO

## 2018-07-09 DIAGNOSIS — Z23 Encounter for immunization: Secondary | ICD-10-CM | POA: Diagnosis not present

## 2018-08-02 ENCOUNTER — Encounter: Payer: Self-pay | Admitting: Family Medicine

## 2018-08-02 ENCOUNTER — Encounter: Payer: BC Managed Care – PPO | Admitting: Family Medicine

## 2018-08-09 LAB — HM MAMMOGRAPHY

## 2018-08-13 ENCOUNTER — Encounter: Payer: Self-pay | Admitting: Family Medicine

## 2018-09-10 ENCOUNTER — Encounter: Payer: Self-pay | Admitting: Gastroenterology

## 2018-09-10 ENCOUNTER — Ambulatory Visit (INDEPENDENT_AMBULATORY_CARE_PROVIDER_SITE_OTHER): Payer: Medicare Other | Admitting: Gastroenterology

## 2018-09-10 VITALS — BP 124/70 | HR 60 | Ht 62.0 in | Wt 239.5 lb

## 2018-09-10 DIAGNOSIS — K921 Melena: Secondary | ICD-10-CM | POA: Diagnosis not present

## 2018-09-10 DIAGNOSIS — K582 Mixed irritable bowel syndrome: Secondary | ICD-10-CM

## 2018-09-10 DIAGNOSIS — K2100 Gastro-esophageal reflux disease with esophagitis, without bleeding: Secondary | ICD-10-CM

## 2018-09-10 DIAGNOSIS — Z86010 Personal history of colonic polyps: Secondary | ICD-10-CM

## 2018-09-10 MED ORDER — NA SULFATE-K SULFATE-MG SULF 17.5-3.13-1.6 GM/177ML PO SOLN: 1.0000 | Freq: Once | ORAL | 0 refills | Status: AC

## 2018-09-10 MED ORDER — OMEPRAZOLE 40 MG PO CPDR: 40.0000 mg | DELAYED_RELEASE_CAPSULE | Freq: Every day | ORAL | 11 refills | Status: DC

## 2018-09-10 NOTE — Progress Notes (Signed)
History of Present Illness: This is a 67 year old female referred by [name], [name], MD for the evaluation of hematochezia, reflux symptoms, bloating alternating diarrhea and constipation.  She relates worsening problems with alternating diarrhea and constipation over the past several months.  Daily MiraLAX has helped somewhat however she feels she has incomplete evacuation even when taking MiraLAX.  She stops MiraLAX when she enters a phase of diarrhea.  For the past month she has noted small amounts of bright red blood per rectum with bowel movements and wiping.  Reflux symptoms are frequently bothersome despite maintaining omeprazole 40 mg daily.    Allergies  Allergen Reactions  . Crestor [Rosuvastatin]   . Erythromycin     rash  . Lansoprazole   . Penicillins     rash  . Potassium-Containing Compounds Other (See Comments)    Stomach upset   . Pravastatin   . Robaxin [Methocarbamol]     Messed up stomach  . Sulfonamide Derivatives     rash  . Trental [Pentoxifylline Er]     Bad headache   Outpatient Medications Prior to Visit  Medication Sig Dispense Refill  . amLODipine (NORVASC) 5 MG tablet Take 1 tablet (5 mg total) by mouth daily. 180 tablet 3  . Aspirin-Caffeine 845-65 MG PACK Take 1-2 packets by mouth daily as needed (headache).    Marland Kitchen atenolol (TENORMIN) 25 MG tablet Take 1 tablet (25 mg total) by mouth daily. 90 tablet 3  . azelastine (ASTELIN) 0.1 % nasal spray Place 2 sprays into both nostrils 2 (two) times daily. Use in each nostril as directed 30 mL 11  . calcium carbonate (TUMS EX) 750 MG chewable tablet Chew 2 tablets by mouth daily as needed for heartburn.    . cyclobenzaprine (FLEXERIL) 10 MG tablet Take 2.5 mg by mouth daily as needed (pain level 8 or greater).     Marland Kitchen diclofenac sodium (VOLTAREN) 1 % GEL APPLY (4G) BY TOPICAL ROUTE 4 TIMES EVERY DAY TO THE AFFECTED AREA(S) (WC PA)  1  . diphenhydrAMINE (BENADRYL) 25 MG tablet Take 25 mg by mouth daily as  needed.    . fexofenadine (ALLEGRA) 60 MG tablet Take 60 mg by mouth daily.    . fluticasone (FLONASE) 50 MCG/ACT nasal spray USE TWO SPRAY (S) IN EACH NOSTRIL ONCE DAILY. 48 g 3  . guaiFENesin (MUCINEX) 600 MG 12 hr tablet Take 300 mg by mouth as needed.    . hydrochlorothiazide (HYDRODIURIL) 25 MG tablet TAKE 1 TABLET BY MOUTH ONCE DAILY 90 tablet 1  . HYDROcodone-acetaminophen (NORCO/VICODIN) 5-325 MG tablet Take 0.5 tablets by mouth daily as needed (pain level 8 or greater). 30 tablet 0  . hydrocortisone 2.5 % ointment APPLY TWICE A DAY 60 g 11  . LORazepam (ATIVAN) 0.5 MG tablet Take 1 tablet (0.5 mg total) by mouth daily as needed for anxiety. 20 tablet 1  . Multiple Vitamins-Minerals (MULTI-VITAMIN GUMMIES PO) Take by mouth as needed.    Marland Kitchen omeprazole (PRILOSEC) 40 MG capsule Take 40 mg by mouth daily.    . permethrin (ELIMITE) 5 % cream Apply cream from neck to feet, leave on 8-14 hours then wash with soap and water.  Repeat in 2 weeks if needed. 60 g 1  . Polyethylene Glycol 3350 (MIRALAX PO) Take by mouth.    . potassium chloride (K-DUR) 10 MEQ tablet Take 2 tablets (20 mEq total) by mouth daily. 180 tablet 3  . pravastatin (PRAVACHOL) 20 MG tablet TAKE  1 TABLET BY MOUTH ONCE DAILY 90 tablet 3  . predniSONE (STERAPRED UNI-PAK 21 TAB) 5 MG (21) TBPK tablet See admin instructions.  0   No facility-administered medications prior to visit.    Past Medical History:  Diagnosis Date  . Abdominal pain, epigastric 12/28/2008   Centricity Description: ABDOMINAL PAIN, EPIGASTRIC Qualifier: Diagnosis of  By: [name], [name]   Centricity Description: ABDOMINAL PAIN-EPIGASTRIC Qualifier: Diagnosis of  By: [name], [name]   . Allergic rhinitis 09/16/2013  . Allergy   . Anemia   . Anxiety   . Anxiety disorder, unspecified 07/06/2017  . Arthritis   . Blood in stool   . BMI 45.0-49.9, adult ([place]) 07/26/2016  . Class 3 obesity with serious comorbidity and body mass index (BMI) of  45.0 to 49.9 in adult 03/07/2017  . DDD (degenerative disc disease), lumbar   . DIVERTICULOSIS-COLON 12/28/2008   Qualifier: Diagnosis of  By: [name], [name]   . GERD 12/28/2008   Qualifier: Diagnosis of  By: [name], [name]   . GERD (gastroesophageal reflux disease)   . Headache   . Hyperglycemia 03/17/2014  . Hypertension   . Hypokalemia 07/26/2016  . IBS (irritable bowel syndrome)-C-D 07/26/2016  . Obesity   . Other and unspecified hyperlipidemia 03/17/2014  . PERSONAL HX COLONIC POLYPS 12/28/2008   Qualifier: Diagnosis of  By: [name], [name]   . Reflux esophagitis 12/28/2008   Qualifier: Diagnosis of  By: Julaine Hua CMA (AAMA), [name]    . S/P lumbar spinal fusion 07/02/2012  . Tubulovillous adenoma of colon 2008   Past Surgical History:  Procedure Laterality Date  . ABDOMINAL HYSTERECTOMY    . COLONOSCOPY  2014  . laser surgery to remove scar     not sure of the year; after hyst  . RADIOLOGY WITH ANESTHESIA N/A 02/25/2015   Procedure: MRI OF LUMBAR SPINE;  Surgeon: Medication Radiologist, MD;  Location: [facility];  Service: Radiology;  Laterality: N/A;  DR. MAX COHEN/MRI  . SPINE SURGERY    . TONSILLECTOMY AND ADENOIDECTOMY     Social History   Socioeconomic History  . Marital status: Widowed    Spouse name: Not on file  . Number of children: Not on file  . Years of education: Not on file  . Highest education level: Not on file  Occupational History  . Not on file  Social Needs  . Financial resource strain: Not on file  . Food insecurity:    Worry: Not on file    Inability: Not on file  . Transportation needs:    Medical: Not on file    Non-medical: Not on file  Tobacco Use  . Smoking status: Former Smoker    Packs/day: 1.00    Years: 38.00    Pack years: 38.00    Types: Cigarettes    Last attempt to quit: 10/28/1997    Years since quitting: 20.8  . Smokeless tobacco: Never Used  Substance and Sexual Activity  . Alcohol use: No  . Drug use: No  .  Sexual activity: Never  Lifestyle  . Physical activity:    Days per week: Not on file    Minutes per session: Not on file  . Stress: Not on file  Relationships  . Social connections:    Talks on phone: Not on file    Gets together: Not on file    Attends religious service: Not on file    Active member of club or  organization: Not on file    Attends meetings of clubs or organizations: Not on file    Relationship status: Not on file  Other Topics Concern  . 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   Family History  Problem Relation Age of Onset  . Hypertension Mother   . Hyperlipidemia Mother   . Hyperlipidemia Father   . Hypertension Father   . Arthritis Father   . Hyperlipidemia Sister   . Cervical cancer Sister   . Diabetes Sister   . Heart disease Sister   . Cancer Maternal Grandfather   . Hypertension Paternal Grandmother   . Pancreatic cancer Maternal Aunt   . Breast cancer Paternal Aunt   . Breast cancer Cousin        Review of Systems: Pertinent positive and negative review of systems were noted in the above HPI section. All other review of systems were otherwise negative.    Physical Exam: General: Well developed, well nourished, obese, no acute distress Head: Normocephalic and atraumatic Eyes:  sclerae anicteric, EOMI Ears: Normal auditory acuity Mouth: No deformity or lesions Neck: Supple, no masses or thyromegaly Lungs: Clear throughout to auscultation Heart: Regular rate and rhythm; no murmurs, rubs or bruits Abdomen: Soft, non tender and non distended. No masses, hepatosplenomegaly or hernias noted. Normal Bowel sounds Rectal: External hemorrhoidal tags there is a linear firm area on the right midline which could have been a hemorrhoid.  It was mildly tender.  No clear evidence of a fissure. Musculoskeletal: Symmetrical with no gross deformities  Skin: No lesions on visible extremities Pulses:  Normal pulses  noted Extremities: No clubbing, cyanosis, edema or deformities noted Neurological: Alert oriented x 4, grossly nonfocal Cervical Nodes:  No significant cervical adenopathy Inguinal Nodes: No significant inguinal adenopathy Psychological:  Alert and cooperative. Normal mood and affect   Assessment and Recommendations:  1.  IBS, severe diverticulosis, hematochezia, alternating constipation and diarrhea.  Suspected IBS and hemorrhoids.  Begin Preparation H suppositories daily for 1 week and then daily as needed.  Continue Preparation H cream externally twice daily as needed. Stay on a high-fiber diet and increase daily water intake.  Begin Benefiber once daily.  Continue MiraLAX daily and hold for diarrhea.  Schedule colonoscopy. The risks (including bleeding, perforation, infection, missed lesions, medication reactions and possible hospitalization or surgery if complications occur), benefits, and alternatives to colonoscopy with possible biopsy and possible polypectomy were discussed with the patient and they consent to proceed.   2.  Personal history of adenomatous colon polyps due for surveillance colonoscopy. Colonoscopy above.   3.  GERD. Follow antireflux measures. Increase omeprazole 40 mg po bid.     cc: [name], [name], [place],  [phone]

## 2018-09-10 NOTE — Patient Instructions (Signed)
You can use preperation H suppositories daily for hemorrhoid symptoms.   Take over the counter Benefiber and Miralax daily.   Increase your omeprazole to 40 mg twice daily. A new prescription has been sent to your pharmacy.   Patient advised to avoid spicy, acidic, citrus, chocolate, mints, fruit and fruit juices.  Limit the intake of caffeine, alcohol and Soda.  Don't exercise too soon after eating.  Don't lie down within 3-4 hours of eating.  Elevate the head of your bed.  You have been scheduled for a colonoscopy. Please follow written instructions given to you at your visit today.  Please pick up your prep supplies at the pharmacy within the next 1-3 days. If you use inhalers (even only as needed), please bring them with you on the day of your procedure. Your physician has requested that you go to www.startemmi.com and enter the access code given to you at your visit today. This web site gives a general overview about your procedure. However, you should still follow specific instructions given to you by our office regarding your preparation for the procedure.  Thank you for choosing me and [facility].  Pricilla Riffle. Dagoberto Ligas., MD., [facility]  High-Fiber Diet Fiber, also called dietary fiber, is a type of carbohydrate found in fruits, vegetables, whole grains, and beans. A high-fiber diet can have many health benefits. Your health care provider may recommend a high-fiber diet to help:  Prevent constipation. Fiber can make your bowel movements more regular.  Lower your cholesterol.  Relieve hemorrhoids, uncomplicated diverticulosis, or irritable bowel syndrome.  Prevent overeating as part of a weight-loss plan.  Prevent heart disease, type 2 diabetes, and certain cancers.  What is my plan? The recommended daily intake of fiber includes:  38 grams for men under age 81.  35 grams for men over age 22.  71 grams for women under age 2.  13 grams for women over age  21.  You can get the recommended daily intake of dietary fiber by eating a variety of fruits, vegetables, grains, and beans. Your health care provider may also recommend a fiber supplement if it is not possible to get enough fiber through your diet. What do I need to know about a high-fiber diet?  Fiber supplements have not been widely studied for their effectiveness, so it is better to get fiber through food sources.  Always check the fiber content on thenutrition facts label of any prepackaged food. Look for foods that contain at least 5 grams of fiber per serving.  Ask your dietitian if you have questions about specific foods that are related to your condition, especially if those foods are not listed in the following section.  Increase your daily fiber consumption gradually. Increasing your intake of dietary fiber too quickly may cause bloating, cramping, or gas.  Drink plenty of water. Water helps you to digest fiber. What foods can I eat? Grains Whole-grain breads. Multigrain cereal. Oats and oatmeal. Brown rice. Barley. Bulgur wheat. [place]. Bran muffins. Popcorn. Rye wafer crackers. Vegetables Sweet potatoes. Spinach. Kale. Artichokes. Cabbage. Broccoli. Green peas. Carrots. Squash. Fruits Berries. Pears. Apples. Oranges. Avocados. Prunes and raisins. Dried figs. Meats and Other Protein Sources Navy, kidney, pinto, and soy beans. Split peas. Lentils. Nuts and seeds. Dairy Fiber-fortified yogurt. Beverages Fiber-fortified soy milk. Fiber-fortified orange juice. Other Fiber bars. The items listed above may not be a complete list of recommended foods or beverages. Contact your dietitian for more options. What foods are not recommended? Grains White  bread. Pasta made with refined flour. White rice. Vegetables Fried potatoes. Canned vegetables. Well-cooked vegetables. Fruits Fruit juice. Cooked, strained fruit. Meats and Other Protein Sources Fatty cuts of meat. Fried  Sales executive or fried fish. Dairy Milk. Yogurt. Cream cheese. Sour cream. Beverages Soft drinks. Other Cakes and pastries. Butter and oils. The items listed above may not be a complete list of foods and beverages to avoid. Contact your dietitian for more information. What are some tips for including high-fiber foods in my diet?  Eat a wide variety of high-fiber foods.  Make sure that half of all grains consumed each day are whole grains.  Replace breads and cereals made from refined flour or white flour with whole-grain breads and cereals.  Replace white rice with brown rice, bulgur wheat, or millet.  Start the day with a breakfast that is high in fiber, such as a cereal that contains at least 5 grams of fiber per serving.  Use beans in place of meat in soups, salads, or pasta.  Eat high-fiber snacks, such as berries, raw vegetables, nuts, or popcorn. This information is not intended to replace advice given to you by your health care provider. Make sure you discuss any questions you have with your health care provider. Document Released: 09/25/2005 Document Revised: 03/02/2016 Document Reviewed: 03/10/2014 Elsevier Interactive Patient Education  [employer].

## 2018-09-18 ENCOUNTER — Other Ambulatory Visit: Payer: Self-pay | Admitting: Family Medicine

## 2018-10-08 ENCOUNTER — Encounter: Payer: Self-pay | Admitting: Gastroenterology

## 2018-10-16 ENCOUNTER — Other Ambulatory Visit (INDEPENDENT_AMBULATORY_CARE_PROVIDER_SITE_OTHER): Payer: Medicare Other

## 2018-10-16 ENCOUNTER — Encounter: Payer: Self-pay | Admitting: Gastroenterology

## 2018-10-16 ENCOUNTER — Telehealth: Payer: Self-pay

## 2018-10-16 ENCOUNTER — Other Ambulatory Visit: Payer: Self-pay

## 2018-10-16 ENCOUNTER — Ambulatory Visit (AMBULATORY_SURGERY_CENTER): Payer: Medicare Other | Admitting: Gastroenterology

## 2018-10-16 VITALS — BP 126/66 | HR 57 | Temp 97.8°F | Resp 13

## 2018-10-16 DIAGNOSIS — R1031 Right lower quadrant pain: Secondary | ICD-10-CM | POA: Diagnosis not present

## 2018-10-16 DIAGNOSIS — K640 First degree hemorrhoids: Secondary | ICD-10-CM

## 2018-10-16 DIAGNOSIS — K6289 Other specified diseases of anus and rectum: Secondary | ICD-10-CM

## 2018-10-16 DIAGNOSIS — K921 Melena: Secondary | ICD-10-CM

## 2018-10-16 DIAGNOSIS — K5730 Diverticulosis of large intestine without perforation or abscess without bleeding: Secondary | ICD-10-CM

## 2018-10-16 DIAGNOSIS — Z86010 Personal history of colonic polyps: Secondary | ICD-10-CM

## 2018-10-16 LAB — CREATININE, SERUM: Creatinine, Ser: 0.86 mg/dL (ref 0.40–1.20)

## 2018-10-16 LAB — BUN: BUN: 10 mg/dL (ref 6–23)

## 2018-10-16 MED ORDER — DICYCLOMINE HCL 10 MG PO CAPS: 10.0000 mg | ORAL_CAPSULE | Freq: Three times a day (TID) | ORAL | 11 refills | Status: DC | PRN

## 2018-10-16 MED ORDER — SODIUM CHLORIDE 0.9 % IV SOLN: 500.0000 mL | Freq: Once | INTRAVENOUS | Status: DC

## 2018-10-16 NOTE — Op Note (Addendum)
[facility] Patient Name: [name] Procedure Date: 10/16/2018 1:39 PM MRN: [id number] Endoscopist: Ladene Artist , MD Age: 68 Referring MD:  Date of Birth: 07-22-1951 Gender: Female Account #: [id number] Procedure:                Colonoscopy Indications:              Hematochezia Medicines:                Monitored Anesthesia Care Procedure:                Pre-Anesthesia Assessment:                           - Prior to the procedure, a History and Physical                            was performed, and patient medications and                            allergies were reviewed. The patient's tolerance of                            previous anesthesia was also reviewed. The risks                            and benefits of the procedure and the sedation                            options and risks were discussed with the patient.                            All questions were answered, and informed consent                            was obtained. Prior Anticoagulants: The patient has                            taken no previous anticoagulant or antiplatelet                            agents. ASA Grade Assessment: II - A patient with                            mild systemic disease. After reviewing the risks                            and benefits, the patient was deemed in                            satisfactory condition to undergo the procedure.                           After obtaining informed consent, the colonoscope  was passed under direct vision. Throughout the                            procedure, the patient's blood pressure, pulse, and                            oxygen saturations were monitored continuously. The                            Colonoscope was introduced through the anus and                            advanced to the the cecum, identified by                            appendiceal orifice and ileocecal valve. The                    ileocecal valve, appendiceal orifice, and rectum                            were photographed. The quality of the bowel                            preparation was good. The colonoscopy was performed                            without difficulty. The patient tolerated the                            procedure well. Scope In: 2:00:11 PM Scope Out: 2:14:11 PM Scope Withdrawal Time: 0 hours 10 minutes 11 seconds  Total Procedure Duration: 0 hours 14 minutes 0 seconds  Findings:                 Hemorrhoids were found on perianal exam.                           Multiple medium-mouthed diverticula were found in                            the entire colon. There was no evidence of                            diverticular bleeding.                           Anal papilla(e) were hypertrophied.                           Internal hemorrhoids were found during                            retroflexion. The hemorrhoids were small and Grade                            I (  internal hemorrhoids that do not prolapse).                           The exam was otherwise without abnormality on                            direct and retroflexion views. Complications:            No immediate complications. Estimated blood loss:                            None. Estimated Blood Loss:     Estimated blood loss: none. Impression:               - Hemorrhoids found on perianal exam.                           - Severe diverticulosis in the entire examined                            colon.                           - Anal papilla(e) were hypertrophied.                           - Internal hemorrhoids.                           - No specimens collected. Recommendation:           - Repeat colonoscopy in 5 years for surveillance.                           - Patient has a contact number available for                            emergencies. The signs and symptoms of potential                            delayed  complications were discussed with the                            patient. Return to normal activities tomorrow.                            Written discharge instructions were provided to the                            patient.                           - High fiber diet.                           - Continue present medications.                           -  Preparation H supp daily and Preparation H cream                            daily as needed for hemorrhoid symptoms.                           - Schedule abdominal/pelvic CT for RLQ abdominal                            pain.                           - Bentyl (dicyclomine) 10 mg PO TID prn abdominal                            pain/bloating, 1 year of refills. Ladene Artist, MD 10/16/2018 2:22:17 PM This report has been signed electronically.

## 2018-10-16 NOTE — Telephone Encounter (Signed)
[name] in [facility] called and asked for orders to be placed for BUN, Creatinine.   Per Colon procedure report pt needs abd/pelvis CT with Contrast for RLQ abd. Pain. Pt has been given contrast per Anne Ng in [facility]. I spoke with both of them.

## 2018-10-16 NOTE — Progress Notes (Signed)
Pt's states no medical or surgical changes since previsit or office visit. 

## 2018-10-16 NOTE — Patient Instructions (Signed)
Continue present medications. Preparation H supp daily and Preparation H cream daily as needed for hemorrhoid symptoms. Abdominal/Pelvic CT for RLQ abdominal pain. High fiber diet. Bentyl 10 mg by mouth TID as needed for abdominal pain/ bloating. Please read handouts provided.      YOU HAD AN ENDOSCOPIC PROCEDURE TODAY AT [facility]:   Refer to the procedure report that was given to you for any specific questions about what was found during the examination.  If the procedure report does not answer your questions, please call your gastroenterologist to clarify.  If you requested that your care partner not be given the details of your procedure findings, then the procedure report has been included in a sealed envelope for you to review at your convenience later.  YOU SHOULD EXPECT: Some feelings of bloating in the abdomen. Passage of more gas than usual.  Walking can help get rid of the air that was put into your GI tract during the procedure and reduce the bloating. If you had a lower endoscopy (such as a colonoscopy or flexible sigmoidoscopy) you may notice spotting of blood in your stool or on the toilet paper. If you underwent a bowel prep for your procedure, you may not have a normal bowel movement for a few days.  Please Note:  You might notice some irritation and congestion in your nose or some drainage.  This is from the oxygen used during your procedure.  There is no need for concern and it should clear up in a day or so.  SYMPTOMS TO REPORT IMMEDIATELY:   Following lower endoscopy (colonoscopy or flexible sigmoidoscopy):  Excessive amounts of blood in the stool  Significant tenderness or worsening of abdominal pains  Swelling of the abdomen that is new, acute  Fever of 100F or higher    For urgent or emergent issues, a gastroenterologist can be reached at any hour by calling [phone].   DIET:  We do recommend a small meal at first, but then you may  proceed to your regular diet.  Drink plenty of fluids but you should avoid alcoholic beverages for 24 hours.  ACTIVITY:  You should plan to take it easy for the rest of today and you should NOT DRIVE or use heavy machinery until tomorrow (because of the sedation medicines used during the test).    FOLLOW UP: Our staff will call the number listed on your records the next business day following your procedure to check on you and address any questions or concerns that you may have regarding the information given to you following your procedure. If we do not reach you, we will leave a message.  However, if you are feeling well and you are not experiencing any problems, there is no need to return our call.  We will assume that you have returned to your regular daily activities without incident.  If any biopsies were taken you will be contacted by phone or by letter within the next 1-3 weeks.  Please call us at [phone] if you have not heard about the biopsies in 3 weeks.    SIGNATURES/CONFIDENTIALITY: You and/or your care partner have signed paperwork which will be entered into your electronic medical record.  These signatures attest to the fact that that the information above on your After Visit Summary has been reviewed and is understood.  Full responsibility of the confidentiality of this discharge information lies with you and/or your care-partner.

## 2018-10-16 NOTE — Progress Notes (Signed)
A/ox3, pleased with MAC, report to RN 

## 2018-10-17 ENCOUNTER — Telehealth: Payer: Self-pay

## 2018-10-17 NOTE — Telephone Encounter (Signed)
Follow up Call-  Call back number 10/16/2018  Post procedure Call Back phone  # [phone]  Permission to leave phone message Yes  Some recent data might be hidden     Patient questions:  Do you have a fever, pain , or abdominal swelling? No. Pain Score  0 *  Have you tolerated food without any problems? Yes.    Have you been able to return to your normal activities? Yes.    Do you have any questions about your discharge instructions: Diet   No. Medications  No. Follow up visit  No.  Do you have questions or concerns about your Care? No.  Actions: * If pain score is 4 or above: No action needed, pain <4.

## 2018-10-17 NOTE — Telephone Encounter (Signed)
NO ANSWER, MESSAGE LEFT FOR PATIENT. 

## 2018-10-22 ENCOUNTER — Other Ambulatory Visit: Payer: Self-pay

## 2018-10-22 DIAGNOSIS — R1031 Right lower quadrant pain: Secondary | ICD-10-CM

## 2018-10-23 NOTE — Progress Notes (Signed)
Called and spoke with patient-patient informed of instructions for CT abd/pelvis via phone call; Patient verbalized understanding of information/instructions; Patient was advised to call back if questions/concerns arise;  A letter with these instructions will also be mailed to the patient for record of being sent to the patient and to also ensure compliance with instructions;

## 2018-11-02 ENCOUNTER — Other Ambulatory Visit: Payer: Self-pay | Admitting: Cardiovascular Disease

## 2018-11-04 ENCOUNTER — Telehealth: Payer: Self-pay | Admitting: *Deleted

## 2018-11-04 NOTE — Telephone Encounter (Signed)
Copied from [place] [id number]. Topic: Quick Communication - Rx Refill/Question >> Nov 04, 2018  2:11 PM Carolyn Stare wrote: atenolol (TENORMIN) 25 MG tablet    PT SAID HEART DOCTOR HAS TURNED HER A LOOSE SO THE MED NEED TO BE FILLED BY DR [name]    Has the patient contacted their pharmacy yes   Preferred Pharmacy    [facility] [address]   Agent: Please be advised that RX refills may take up to 3 business days. We ask that you follow-up with your pharmacy.

## 2018-11-04 NOTE — Telephone Encounter (Signed)
Is it okay to refill?

## 2018-11-05 ENCOUNTER — Other Ambulatory Visit: Payer: Self-pay | Admitting: Family Medicine

## 2018-11-05 MED ORDER — ATENOLOL 25 MG PO TABS: 25.0000 mg | ORAL_TABLET | Freq: Every day | ORAL | 0 refills | Status: DC

## 2018-11-05 NOTE — Telephone Encounter (Signed)
Pt got a 30 day supply yesterday from cardiology but was advised Dr. [name] will need to follow unless she feels pt needs to go back to cardiology.

## 2018-11-05 NOTE — Telephone Encounter (Signed)
Prescription for atenolol 25 mg to continue once daily was sent to her pharmacy. Thanks, BJ

## 2018-11-06 NOTE — Telephone Encounter (Signed)
Noted. No further assistance needed.

## 2018-11-08 ENCOUNTER — Ambulatory Visit (INDEPENDENT_AMBULATORY_CARE_PROVIDER_SITE_OTHER)
Admission: RE | Admit: 2018-11-08 | Discharge: 2018-11-08 | Disposition: A | Discharge status: Home / Self Care | Payer: Medicare Other | Source: Ambulatory Visit | Attending: Gastroenterology | Admitting: Gastroenterology

## 2018-11-08 ENCOUNTER — Encounter (INDEPENDENT_AMBULATORY_CARE_PROVIDER_SITE_OTHER): Payer: Self-pay

## 2018-11-08 DIAGNOSIS — R1031 Right lower quadrant pain: Secondary | ICD-10-CM

## 2018-11-08 MED ORDER — IOPAMIDOL (ISOVUE-300) INJECTION 61%
100.0000 mL | Freq: Once | INTRAVENOUS | Status: AC | PRN
  Administered 2018-11-08: 100 mL via INTRAVENOUS

## 2018-11-15 ENCOUNTER — Ambulatory Visit: Payer: Medicare Other | Admitting: Family Medicine

## 2018-11-15 VITALS — BP 122/78 | HR 71 | Temp 97.9°F | Resp 12 | Ht 62.0 in | Wt 246.2 lb

## 2018-11-15 DIAGNOSIS — E785 Hyperlipidemia, unspecified: Secondary | ICD-10-CM

## 2018-11-15 DIAGNOSIS — E876 Hypokalemia: Secondary | ICD-10-CM

## 2018-11-15 DIAGNOSIS — F419 Anxiety disorder, unspecified: Secondary | ICD-10-CM | POA: Diagnosis not present

## 2018-11-15 DIAGNOSIS — E1169 Type 2 diabetes mellitus with other specified complication: Secondary | ICD-10-CM

## 2018-11-15 DIAGNOSIS — I10 Essential (primary) hypertension: Secondary | ICD-10-CM | POA: Diagnosis not present

## 2018-11-15 LAB — COMPREHENSIVE METABOLIC PANEL
ALT: 16 U/L (ref 0–35)
AST: 21 U/L (ref 0–37)
Albumin: 4.2 g/dL (ref 3.5–5.2)
Alkaline Phosphatase: 78 U/L (ref 39–117)
BUN: 20 mg/dL (ref 6–23)
CO2: 29 meq/L (ref 19–32)
Calcium: 9.8 mg/dL (ref 8.4–10.5)
Chloride: 102 mEq/L (ref 96–112)
Creatinine, Ser: 0.9 mg/dL (ref 0.40–1.20)
GFR: 75.96 mL/min (ref >60.00)
Glucose, Bld: 103 mg/dL — ABNORMAL HIGH (ref 70–99)
Potassium: 4 mEq/L (ref 3.5–5.1)
Sodium: 142 mEq/L (ref 135–145)
Total Bilirubin: 0.2 mg/dL (ref 0.2–1.2)
Total Protein: 6.6 g/dL (ref 6.0–8.3)

## 2018-11-15 LAB — LIPID PANEL
Cholesterol: 153 mg/dL (ref 0–200)
HDL: 43.7 mg/dL (ref >39.00)
LDL Cholesterol: 91 mg/dL (ref 0–99)
NonHDL: 109.43
Total CHOL/HDL Ratio: 4
Triglycerides: 90 mg/dL (ref 0.0–149.0)
VLDL: 18 mg/dL (ref 0.0–40.0)

## 2018-11-15 LAB — HEMOGLOBIN A1C: Hgb A1c MFr Bld: 6.6 % — ABNORMAL HIGH (ref 4.6–6.5)

## 2018-11-15 NOTE — Progress Notes (Signed)
HPI:   [name] is a 68 y.o. female, who is here today for 6 months follow up.   She was last seen on 05/15/18. Since her last OV she has seen GI. Developed "gas pain,nasuea,vomiting after receiving contrast for abd CT. She has recovered.    HLD: Currently she is on Pravastatin 20 mg daily. She has not been consistent with low fat diet.  Lab Results  Component Value Date   CHOL 170 09/07/2017   HDL 51.90 09/07/2017   LDLCALC 100 (H) 09/07/2017   TRIG 93.0 09/07/2017   CHOLHDL 3 09/07/2017     She has Hx of lower back pain with radiculopathy and received steroid treatments a few times per year.   Hypertension: Denies severe/frequent headache, visual changes, chest pain, dyspnea, claudication, focal weakness, or worsening edema. She is on Atenolol 25 mg daily,Amlodipine 5 mg, and HCTZ 25 mg daily. Home BP's "good." She is not following low salt diet. Canned chicken noodle soup daily.  Lab Results  Component Value Date   CREATININE 0.86 10/16/2018   BUN 10 10/16/2018   NA 142 05/15/2018   K 3.8 05/15/2018   CL 100 05/15/2018   CO2 33 (H) 05/15/2018  Hypokalemia, currently on K-Dur 10 mEq daily.  Diabetes Mellitus II:  Currently on non pharmacologic treatment.. Not checking BS's.  She denies abdominal pain, nausea, vomiting, polydipsia, polyuria, or polyphagia. Negative for feet numbness or burning.  Lab Results  Component Value Date   HGBA1C 5.9 (A) 05/15/2018   Lab Results  Component Value Date   MICROALBUR <0.7 02/01/2018   Anxiety, she is on Lorazepam 0.5 mg daily as needed.P Intermittent panic attacks,palpitations and fear.  Problem exacerbated by mother's health conditions,when she has MRI. + Stress.  Denies depressed mood or suicidal thoughts.  She has not been consistent with following a healthful diet, she has noticed some weight gain. She attributes weight gain to prednisone taper she receives periodically to treat  radicular lumbar pain.LE's numbness,intermittently.    Review of Systems  Constitutional: Negative for activity change, appetite change, fatigue and fever.  HENT: Negative for mouth sores, nosebleeds and trouble swallowing.   Eyes: Negative for redness and visual disturbance.  Respiratory: Negative for cough, shortness of breath and wheezing.   Cardiovascular: Negative for chest pain, palpitations and leg swelling.  Gastrointestinal: Negative for abdominal pain, nausea and vomiting.       Negative for changes in bowel habits.  Endocrine: Negative for polydipsia, polyphagia and polyuria.  Genitourinary: Negative for decreased urine volume and hematuria.  Musculoskeletal: Positive for arthralgias and back pain. Negative for gait problem.  Skin: Negative for rash and wound.  Neurological: Negative for syncope, weakness and headaches.  Psychiatric/Behavioral: Negative for confusion and suicidal ideas. The patient is nervous/anxious.      Current Outpatient Medications on File Prior to Visit  Medication Sig Dispense Refill  . amLODipine (NORVASC) 5 MG tablet Take 1 tablet (5 mg total) by mouth daily. 180 tablet 3  . Aspirin-Caffeine 845-65 MG PACK Take 1-2 packets by mouth daily as needed (headache).    Marland Kitchen atenolol (TENORMIN) 25 MG tablet Take 1 tablet (25 mg total) by mouth daily. 90 tablet 0  . azelastine (ASTELIN) 0.1 % nasal spray Place 2 sprays into both nostrils 2 (two) times daily. Use in each nostril as directed (Patient not taking: Reported on 10/16/2018) 30 mL 11  . calcium carbonate (TUMS EX) 750 MG chewable tablet Chew 2 tablets by  mouth daily as needed for heartburn.    . cyclobenzaprine (FLEXERIL) 10 MG tablet Take 2.5 mg by mouth daily as needed (pain level 8 or greater).     Marland Kitchen diclofenac sodium (VOLTAREN) 1 % GEL APPLY (4G) BY TOPICAL ROUTE 4 TIMES EVERY DAY TO THE AFFECTED AREA(S) (WC PA)  1  . dicyclomine (BENTYL) 10 MG capsule Take 1 capsule (10 mg total) by mouth 3 (three)  times daily as needed for spasms. 90 capsule 11  . diphenhydrAMINE (BENADRYL) 25 MG tablet Take 25 mg by mouth daily as needed.    . fexofenadine (ALLEGRA) 60 MG tablet Take 60 mg by mouth daily.    . fluticasone (FLONASE) 50 MCG/ACT nasal spray USE TWO SPRAY (S) IN EACH NOSTRIL ONCE DAILY. (Patient not taking: Reported on 10/16/2018) 48 g 3  . guaiFENesin (MUCINEX) 600 MG 12 hr tablet Take 300 mg by mouth as needed.    . hydrochlorothiazide (HYDRODIURIL) 25 MG tablet TAKE 1 TABLET BY MOUTH ONCE DAILY 90 tablet 1  . HYDROcodone-acetaminophen (NORCO/VICODIN) 5-325 MG tablet Take 0.5 tablets by mouth daily as needed (pain level 8 or greater). 30 tablet 0  . hydrocortisone 2.5 % ointment APPLY TWICE A DAY (Patient not taking: Reported on 10/16/2018) 60 g 11  . LORazepam (ATIVAN) 0.5 MG tablet Take 1 tablet (0.5 mg total) by mouth daily as needed for anxiety. (Patient not taking: Reported on 10/16/2018) 20 tablet 1  . Multiple Vitamins-Minerals (MULTI-VITAMIN GUMMIES PO) Take by mouth as needed.    Marland Kitchen omeprazole (PRILOSEC) 40 MG capsule Take 1 capsule (40 mg total) by mouth daily. 60 capsule 11  . permethrin (ELIMITE) 5 % cream Apply cream from neck to feet, leave on 8-14 hours then wash with soap and water.  Repeat in 2 weeks if needed. (Patient not taking: Reported on 10/16/2018) 60 g 1  . Polyethylene Glycol 3350 (MIRALAX PO) Take by mouth.    . potassium chloride (K-DUR) 10 MEQ tablet Take 2 tablets (20 mEq total) by mouth daily. 180 tablet 3  . pravastatin (PRAVACHOL) 20 MG tablet TAKE 1 TABLET BY MOUTH ONCE DAILY 90 tablet 3   No current facility-administered medications on file prior to visit.      Past Medical History:  Diagnosis Date  . Abdominal pain, epigastric 12/28/2008   Centricity Description: ABDOMINAL PAIN, EPIGASTRIC Qualifier: Diagnosis of  By: [name], [name]   Centricity Description: ABDOMINAL PAIN-EPIGASTRIC Qualifier: Diagnosis of  By: [name], [name]   . Allergic  rhinitis 09/16/2013  . Allergy   . Anemia   . Anxiety   . Anxiety disorder, unspecified 07/06/2017  . Arthritis   . Blood in stool   . BMI 45.0-49.9, adult ([place]) 07/26/2016  . Class 3 obesity with serious comorbidity and body mass index (BMI) of 45.0 to 49.9 in adult 03/07/2017  . DDD (degenerative disc disease), lumbar   . DIVERTICULOSIS-COLON 12/28/2008   Qualifier: Diagnosis of  By: [name], [name]   . GERD 12/28/2008   Qualifier: Diagnosis of  By: [name], [name]   . GERD (gastroesophageal reflux disease)   . Headache   . Hyperglycemia 03/17/2014  . Hypertension   . Hypokalemia 07/26/2016  . IBS (irritable bowel syndrome)-C-D 07/26/2016  . Obesity   . Other and unspecified hyperlipidemia 03/17/2014  . PERSONAL HX COLONIC POLYPS 12/28/2008   Qualifier: Diagnosis of  By: [name], [name]   . Reflux esophagitis 12/28/2008   Qualifier: Diagnosis of  By: Julaine Hua CMA ([employer]), [name]    . S/P lumbar spinal fusion 07/02/2012  . Tubulovillous adenoma of colon 2008   Allergies  Allergen Reactions  . Crestor [Rosuvastatin]   . Erythromycin     rash  . Lansoprazole   . Penicillins     rash  . Potassium-Containing Compounds Other (See Comments)    Stomach upset, can take pills, but not the packets   . Robaxin [Methocarbamol]     Messed up stomach  . Sulfonamide Derivatives     rash  . [place]al [Pentoxifylline Er]     Bad headache    Social History   Socioeconomic History  . Marital status: Widowed    Spouse name: Not on file  . Number of children: Not on file  . Years of education: Not on file  . Highest education level: Not on file  Occupational History  . Not on file  Social Needs  . Financial resource strain: Not on file  . Food insecurity:    Worry: Not on file    Inability: Not on file  . Transportation needs:    Medical: Not on file    Non-medical: Not on file  Tobacco Use  . Smoking status: Former Smoker    Packs/day: 1.00    Years: 38.00     Pack years: 38.00    Types: Cigarettes    Last attempt to quit: 10/28/1997    Years since quitting: 21.0  . Smokeless tobacco: Never Used  Substance and Sexual Activity  . Alcohol use: No  . Drug use: No  . Sexual activity: Never  Lifestyle  . Physical activity:    Days per week: Not on file    Minutes per session: Not on file  . Stress: Not on file  Relationships  . Social connections:    Talks on phone: Not on file    Gets together: Not on file    Attends religious service: Not on file    Active member of club or organization: Not on file    Attends meetings of clubs or organizations: Not on file    Relationship status: Not on file  Other Topics Concern  . 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    Vitals:   11/15/18 0739  BP: 122/78  Pulse: 71  Resp: 12  Temp: 97.9 F (36.6 C)  SpO2: 97%   Body mass index is 45.03 kg/m.   Wt Readings from Last 3 Encounters:  11/15/18 246 lb 3.2 oz (111.7 kg)  09/10/18 239 lb 8 oz (108.6 kg)  08/02/18 239 lb 8 oz (108.6 kg)    Physical Exam  Nursing note and vitals reviewed. Constitutional: She is oriented to person, place, and time. She appears well-developed. No distress.  HENT:  Head: Normocephalic and atraumatic.  Mouth/Throat: Oropharynx is clear and moist and mucous membranes are normal.  Eyes: Pupils are equal, round, and reactive to light. Conjunctivae are normal.  Cardiovascular: Normal rate and regular rhythm.  No murmur heard. Pulses:      Dorsalis pedis pulses are 2+ on the right side and 2+ on the left side.  Respiratory: Effort normal and breath sounds normal. No respiratory distress.  GI: Soft. She exhibits no mass. There is no hepatomegaly. There is no abdominal tenderness.  Musculoskeletal:        General: Edema (Trace pitting edema LE, bilateral) present.     Comments: Antalgic gait.  Lymphadenopathy:  She has no cervical adenopathy.  Neurological: She is  alert and oriented to person, place, and time. She has normal strength. No cranial nerve deficit.  Skin: Skin is warm. No rash noted. No erythema.  Psychiatric: Her mood appears anxious.  Well groomed, good eye contact.     ASSESSMENT AND PLAN:   [name] was seen today for 6 months follow-up.  Orders Placed This Encounter  Procedures  . Hemoglobin A1c  . Comprehensive metabolic panel  . Lipid panel   Lab Results  Component Value Date   CHOL 153 11/15/2018   HDL 43.70 11/15/2018   LDLCALC 91 11/15/2018   TRIG 90.0 11/15/2018   CHOLHDL 4 11/15/2018   Lab Results  Component Value Date   CREATININE 0.90 11/15/2018   BUN 20 11/15/2018   NA 142 11/15/2018   K 4.0 11/15/2018   CL 102 11/15/2018   CO2 29 11/15/2018   Lab Results  Component Value Date   ALT 16 11/15/2018   AST 21 11/15/2018   ALKPHOS 78 11/15/2018   BILITOT 0.2 11/15/2018   Lab Results  Component Value Date   HGBA1C 6.6 (H) 11/15/2018    1. Type 2 diabetes mellitus with other specified complication, without long-term current use of insulin ([place]) HgA1C has been at goal. Continue non pharmacologic treatment for now. Regular exercise and healthy diet with avoidance of added sugar food intake is an important part of treatment and recommended. Annual eye exam, periodic dental and foot care recommended. F/U in 5-6 months   2. Essential hypertension Adequately controlled. No changes in current management. DASH-low salt diet recommended. Eye exam recommended annually. F/U in 6 months, before if needed.   3. Hypokalemia No changes in current management, will follow labs done today and will give further recommendations accordingly.  4. Hyperlipidemia associated with type 2 diabetes mellitus ([place]) No changes in Pravastatin 20 mg. Further recommendations will be given according to FLP results.  5. Anxiety disorder, unspecified type Stable. No changes in Lorazepam,side effects  discussed.    Return in about 6 months (around 05/16/2019) for f/u and routine.       Jourdyn Ferrin G. [name], MD  [facility]. [place] office.

## 2018-11-15 NOTE — Patient Instructions (Signed)
A few things to remember from today's visit:   Essential hypertension - Plan: Comprehensive metabolic panel  Hypokalemia - Plan: Comprehensive metabolic panel  Anxiety disorder, unspecified type  Type 2 diabetes mellitus with other specified complication, without long-term current use of insulin ([facility]) - Plan: Hemoglobin A1c, Comprehensive metabolic panel  Hyperlipidemia associated with type 2 diabetes mellitus ([place]) - Plan: Lipid panel   Please be sure medication list is accurate. If a new problem present, please set up appointment sooner than planned today.

## 2018-11-17 ENCOUNTER — Encounter: Payer: Self-pay | Admitting: Family Medicine

## 2018-11-25 ENCOUNTER — Ambulatory Visit: Payer: Self-pay | Admitting: Cardiovascular Disease

## 2018-12-04 ENCOUNTER — Telehealth: Payer: Self-pay | Admitting: Family Medicine

## 2018-12-04 NOTE — Telephone Encounter (Signed)
Copied from [place] [id number] [id number]. Topic: Quick Communication - See Telephone Encounter >> Dec 04, 2018 11:20 AM Bea Graff, NT wrote: CRM for notification. See Telephone encounter for: 12/04/18. Pt is needing the atenolol (TENORMIN) 25 MG tablet resent to the pharmacy so that she can have this refilled. Pt states she could not pick up the one on 11/05/2018 because Dr. Lenna Gilford had refilled for 30 days and they stated she would have to wait until that rx was gone. Pt is now finished with that rx. [facility] ([address]), [place] [phone] (Phone) [phone] (Fax)

## 2018-12-04 NOTE — Telephone Encounter (Signed)
Call to pharmacy- they are filling the Rx now.

## 2019-01-06 ENCOUNTER — Other Ambulatory Visit: Payer: Self-pay | Admitting: Cardiovascular Disease

## 2019-02-07 ENCOUNTER — Ambulatory Visit: Payer: Self-pay

## 2019-02-11 ENCOUNTER — Other Ambulatory Visit: Payer: Self-pay | Admitting: Family Medicine

## 2019-02-11 ENCOUNTER — Ambulatory Visit: Payer: Medicare Other

## 2019-02-11 ENCOUNTER — Other Ambulatory Visit: Payer: Self-pay | Admitting: Cardiovascular Disease

## 2019-03-18 ENCOUNTER — Other Ambulatory Visit: Payer: Self-pay | Admitting: Family Medicine

## 2019-04-14 ENCOUNTER — Ambulatory Visit: Payer: Medicare Other

## 2019-04-20 ENCOUNTER — Other Ambulatory Visit: Payer: Self-pay | Admitting: Cardiovascular Disease

## 2019-05-16 ENCOUNTER — Ambulatory Visit: Payer: Medicare Other | Admitting: Family Medicine

## 2019-05-16 ENCOUNTER — Encounter: Payer: Self-pay | Admitting: Family Medicine

## 2019-05-16 VITALS — BP 130/84 | HR 74 | Temp 97.5°F | Resp 12 | Ht 62.0 in | Wt 243.5 lb

## 2019-05-16 DIAGNOSIS — E1169 Type 2 diabetes mellitus with other specified complication: Secondary | ICD-10-CM | POA: Diagnosis not present

## 2019-05-16 DIAGNOSIS — I10 Essential (primary) hypertension: Secondary | ICD-10-CM | POA: Diagnosis not present

## 2019-05-16 DIAGNOSIS — K649 Unspecified hemorrhoids: Secondary | ICD-10-CM | POA: Diagnosis not present

## 2019-05-16 DIAGNOSIS — F419 Anxiety disorder, unspecified: Secondary | ICD-10-CM | POA: Diagnosis not present

## 2019-05-16 DIAGNOSIS — E876 Hypokalemia: Secondary | ICD-10-CM

## 2019-05-16 DIAGNOSIS — Z23 Encounter for immunization: Secondary | ICD-10-CM | POA: Diagnosis not present

## 2019-05-16 DIAGNOSIS — Z9109 Other allergy status, other than to drugs and biological substances: Secondary | ICD-10-CM

## 2019-05-16 DIAGNOSIS — Z780 Asymptomatic menopausal state: Secondary | ICD-10-CM

## 2019-05-16 DIAGNOSIS — Z0000 Encounter for general adult medical examination without abnormal findings: Secondary | ICD-10-CM

## 2019-05-16 DIAGNOSIS — L237 Allergic contact dermatitis due to plants, except food: Secondary | ICD-10-CM

## 2019-05-16 LAB — MICROALBUMIN / CREATININE URINE RATIO
Creatinine,U: 80.2 mg/dL
Microalb Creat Ratio: 0.9 mg/g (ref 0.0–30.0)
Microalb, Ur: <0.7 mg/dL (ref 0.0–1.9)

## 2019-05-16 LAB — CBC
HCT: 40.2 % (ref 36.0–46.0)
Hemoglobin: 13.4 g/dL (ref 12.0–15.0)
MCHC: 33.3 g/dL (ref 30.0–36.0)
MCV: 87.1 fl (ref 78.0–100.0)
Platelets: 227 10*3/uL (ref 150.0–400.0)
RBC: 4.61 Mil/uL (ref 3.87–5.11)
RDW: 15.8 % — ABNORMAL HIGH (ref 11.5–15.5)
WBC: 5.1 10*3/uL (ref 4.0–10.5)

## 2019-05-16 LAB — BASIC METABOLIC PANEL
BUN: 22 mg/dL (ref 6–23)
CO2: 30 mEq/L (ref 19–32)
Calcium: 10.1 mg/dL (ref 8.4–10.5)
Chloride: 102 mEq/L (ref 96–112)
Creatinine, Ser: 0.87 mg/dL (ref 0.40–1.20)
GFR: 78.87 mL/min (ref >60.00)
Glucose, Bld: 120 mg/dL — ABNORMAL HIGH (ref 70–99)
Potassium: 3.7 mEq/L (ref 3.5–5.1)
Sodium: 142 mEq/L (ref 135–145)

## 2019-05-16 LAB — HEMOGLOBIN A1C: Hgb A1c MFr Bld: 6.6 % — ABNORMAL HIGH (ref 4.6–6.5)

## 2019-05-16 MED ORDER — TRIAMCINOLONE ACETONIDE 0.1 % EX CREA: 1.0000 "application " | TOPICAL_CREAM | Freq: Two times a day (BID) | CUTANEOUS | 1 refills | Status: DC

## 2019-05-16 NOTE — Progress Notes (Addendum)
HPI:   [name] is a 68 y.o. female, who is here today for chronic disease management and requesting AWV.  Last OV on 11/15/18. Since her last OV she has followed with pain management,states that she received 2 steroid shots on each side of her back.  HTN,she is on HCTZ 25 mg daily,Atenolol 25 mg daily, and Amlodipine 5 mg daily. Home BP's 120's/70's Denies severe/frequent headache, visual changes, chest pain, dyspnea, palpitation, claudication, focal weakness, or edema. HypoK+,she is on K-DUR 10 meq 2 tabs daily. She has not noted cramps.  Component     Latest Ref Rng & Units 11/15/2018  Sodium     135 - 145 mEq/L 142  Potassium     3.5 - 5.1 mEq/L 4.0  Chloride     96 - 112 mEq/L 102  CO2     19 - 32 mEq/L 29  Glucose     70 - 99 mg/dL 103 (H)  BUN     6 - 23 mg/dL 20  Creatinine     0.40 - 1.20 mg/dL 0.90  Total Bilirubin     0.2 - 1.2 mg/dL 0.2  Alkaline Phosphatase     39 - 117 U/L 78  AST     0 - 37 U/L 21  ALT     0 - 35 U/L 16  Total Protein     6.0 - 8.3 g/dL 6.6  Albumin     3.5 - 5.2 g/dL 4.2  Calcium     8.4 - 10.5 mg/dL 9.8  GFR     >60.00 mL/min 75.96    DM II,she is on non pharmacologic treatment. Dx on 2018 (HgA1C 6.7) She is not checking BS's. It is difficult for her to follow dietary recommendations because dietary restrictions due to food intolerance,some aggravate IBS.  Last HgA1C on 11/15/18 was 6.6.  Denies unusual (hx of IBS) abdominal pain, nausea,vomiting, polydipsia,polyuria, or polyphagia. HLD, she is on Pravastatin 20 mg daily.  Anxiety,she takes Lorazepam 0.5 mg daily as needed. Denies depressed mood or suicidal thoughts. Problem exacerbated by mother's chronic health issues.  She needs refills on Astelin nasal spray,she takes med daily for allergic rhinitis. She is also on Flonase nasal spray  And Allegra.  Last AWV on 02/01/18. She lives with her mother. She has been married 3 times. No children.   She does not exercise regularly die to chronic back pain.  Former smoker. Independent ADL's and IADL's. No falls in the past year and denies depression symptoms.  She does not take Ca++ or Vit D supplementation, afraid of aggravating GI symptoms.  Functional Status Survey: Is the patient deaf or have difficulty hearing?: No Does the patient have difficulty seeing, even when wearing glasses/contacts?: No Does the patient have difficulty concentrating, remembering, or making decisions?: No Does the patient have difficulty walking or climbing stairs?: No Does the patient have difficulty dressing or bathing?: No Does the patient have difficulty doing errands alone such as visiting a doctor's office or shopping?: No  Fall Risk  05/16/2019 02/01/2018 04/27/2017 03/14/2016 02/17/2016  Falls in the past year? 0 Yes No No Yes  Comment - - - - -  Number falls in past yr: 0 2 or more - - 2 or more  Injury with Fall? 0 No - - -  Risk for fall due to : Orthopedic patient - - - -  Follow up Education provided Education provided - - -   Providers  she sees regularly: Eye care provider: Dr [name] [facility],Dr [name]. GERD, Omeprazole increased from 40 mg daily to bid. Pain management, Dr [name], Dr [name].  Depression screen [facility] 2/9 05/16/2019  Decreased Interest 0  Down, Depressed, Hopeless 0  PHQ - 2 Score 0    Mini-Cog - 05/16/19 2323    Normal clock drawing test?  yes    How many words correct?  3        Hearing Screening   125Hz  250Hz  500Hz  1000Hz  2000Hz  3000Hz  4000Hz  6000Hz  8000Hz   Right ear:           Left ear:             Visual Acuity Screening   Right eye Left eye Both eyes  Without correction: 20/40 20/50 20/30   With correction:       She is her mother's caregiver.  After visit,she requested CBC done while she was in the lab. Apparently she has had rectal bleeding with defecation,noted on tissue.  Hx of hemorrhoids. She follows with GI. She has seen Dr Samuella Herring similar  problem.  Pruritic rash that started after yard work,she thinks she got in contact with poison ivy. She has applied OTC Benadryl. + Pruritus. Slowly getting better. She denies associated fever,chills,oral lesions,cough,wheezing,or stridor.  Review of Systems  Constitutional: Negative for activity change, appetite change and unexpected weight change.  HENT: Negative for nosebleeds and sore throat.   Eyes: Negative for pain and redness.  Respiratory: Negative for cough and wheezing.   Gastrointestinal: Positive for anal bleeding. Negative for nausea and vomiting.  Endocrine: Negative for cold intolerance and heat intolerance.  Genitourinary: Negative for decreased urine volume and dysuria.  Musculoskeletal: Positive for arthralgias and back pain. Negative for gait problem.  Skin: Negative for wound.  Allergic/Immunologic: Positive for environmental allergies.  Neurological: Negative for syncope and facial asymmetry.  Psychiatric/Behavioral: Negative for confusion. The patient is nervous/anxious.   Rest see pertinent positives and negatives per HPI.   Current Outpatient Medications on File Prior to Visit  Medication Sig Dispense Refill  . amLODipine (NORVASC) 5 MG tablet     . Aspirin-Caffeine 845-65 MG PACK Take 1-2 packets by mouth daily as needed (headache).    Marland Kitchen atenolol (TENORMIN) 25 MG tablet Take 1 tablet by mouth once daily 90 tablet 0  . azelastine (ASTELIN) 0.1 % nasal spray Place 2 sprays into both nostrils 2 (two) times daily. Use in each nostril as directed 30 mL 11  . calcium carbonate (TUMS EX) 750 MG chewable tablet Chew 2 tablets by mouth daily as needed for heartburn.    . cyclobenzaprine (FLEXERIL) 10 MG tablet Take 2.5 mg by mouth daily as needed (pain level 8 or greater).     Marland Kitchen diclofenac sodium (VOLTAREN) 1 % GEL APPLY (4G) BY TOPICAL ROUTE 4 TIMES EVERY DAY TO THE AFFECTED AREA(S) (WC PA)  1  . dicyclomine (BENTYL) 10 MG capsule Take 1 capsule (10 mg total) by  mouth 3 (three) times daily as needed for spasms. 90 capsule 11  . diphenhydrAMINE (BENADRYL) 25 MG tablet Take 25 mg by mouth daily as needed.    . fexofenadine (ALLEGRA) 60 MG tablet Take 60 mg by mouth daily.    . fluticasone (FLONASE) 50 MCG/ACT nasal spray Use 2 spray(s) in each nostril once daily 48 g 0  . guaiFENesin (MUCINEX) 600 MG 12 hr tablet Take 300 mg by mouth as needed.    . hydrochlorothiazide (HYDRODIURIL) 25 MG tablet Take 1  tablet by mouth once daily 90 tablet 0  . HYDROcodone-acetaminophen (NORCO/VICODIN) 5-325 MG tablet Take 0.5 tablets by mouth daily as needed (pain level 8 or greater). 30 tablet 0  . hydrocortisone 2.5 % ointment APPLY TWICE A DAY 60 g 11  . LORazepam (ATIVAN) 0.5 MG tablet Take 1 tablet (0.5 mg total) by mouth daily as needed for anxiety. 20 tablet 1  . Multiple Vitamins-Minerals (MULTI-VITAMIN GUMMIES PO) Take by mouth as needed.    Marland Kitchen omeprazole (PRILOSEC) 40 MG capsule Take 1 capsule (40 mg total) by mouth daily. (Patient taking differently: Take 80 mg by mouth daily. ) 60 capsule 11  . Polyethylene Glycol 3350 (MIRALAX PO) Take by mouth.    . potassium chloride (K-DUR) 10 MEQ tablet Take 2 tablets by mouth once daily 180 tablet 0  . pravastatin (PRAVACHOL) 20 MG tablet TAKE 1 TABLET BY MOUTH ONCE DAILY 90 tablet 3   No current facility-administered medications on file prior to visit.      Past Medical History:  Diagnosis Date  . Abdominal pain, epigastric 12/28/2008   Centricity Description: ABDOMINAL PAIN, EPIGASTRIC Qualifier: Diagnosis of  By: [name], [name]   Centricity Description: ABDOMINAL PAIN-EPIGASTRIC Qualifier: Diagnosis of  By: [name], [name]   . Allergic rhinitis 09/16/2013  . Allergy   . Anemia   . Anxiety   . Anxiety disorder, unspecified 07/06/2017  . Arthritis   . Blood in stool   . BMI 45.0-49.9, adult ([place]) 07/26/2016  . Class 3 obesity with serious comorbidity and body mass index (BMI) of 45.0 to 49.9 in  adult 03/07/2017  . DDD (degenerative disc disease), lumbar   . DIVERTICULOSIS-COLON 12/28/2008   Qualifier: Diagnosis of  By: [name], [name]   . GERD 12/28/2008   Qualifier: Diagnosis of  By: [name], [name]   . GERD (gastroesophageal reflux disease)   . Headache   . Hyperglycemia 03/17/2014  . Hypertension   . Hypokalemia 07/26/2016  . IBS (irritable bowel syndrome)-C-D 07/26/2016  . Obesity   . Other and unspecified hyperlipidemia 03/17/2014  . PERSONAL HX COLONIC POLYPS 12/28/2008   Qualifier: Diagnosis of  By: [name], [name]   . Reflux esophagitis 12/28/2008   Qualifier: Diagnosis of  By: Julaine Hua CMA (AAMA), [name]    . S/P lumbar spinal fusion 07/02/2012  . Tubulovillous adenoma of colon 2008   Allergies  Allergen Reactions  . Crestor [Rosuvastatin]   . Erythromycin     rash  . Lansoprazole   . Penicillins     rash  . Potassium-Containing Compounds Other (See Comments)    Stomach upset, can take pills, but not the packets   . Robaxin [Methocarbamol]     Messed up stomach  . Sulfonamide Derivatives     rash  . Trental [Pentoxifylline Er]     Bad headache    Social History   Socioeconomic History  . Marital status: Widowed    Spouse name: Not on file  . Number of children: Not on file  . Years of education: Not on file  . Highest education level: Not on file  Occupational History  . Not on file  Social Needs  . Financial resource strain: Not on file  . Food insecurity    Worry: Not on file    Inability: Not on file  . Transportation needs    Medical: Not on file    Non-medical: Not on file  Tobacco Use  . Smoking status:  Former Smoker    Packs/day: 1.00    Years: 38.00    Pack years: 38.00    Types: Cigarettes    Quit date: 10/28/1997    Years since quitting: 21.5  . Smokeless tobacco: Never Used  Substance and Sexual Activity  . Alcohol use: No  . Drug use: No  . Sexual activity: Never  Lifestyle  . Physical activity    Days per  week: Not on file    Minutes per session: Not on file  . Stress: Not on file  Relationships  . Social Herbalist on phone: Not on file    Gets together: Not on file    Attends religious service: Not on file    Active member of club or organization: Not on file    Attends meetings of clubs or organizations: Not on file    Relationship status: Not on file  Other Topics Concern  . 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    Vitals:   05/16/19 0755  BP: 130/84  Pulse: 74  Resp: 12  Temp: (!) 97.5 F (36.4 C)  SpO2: 97%   Body mass index is 44.54 kg/m.  Wt Readings from Last 3 Encounters:  05/16/19 243 lb 8 oz (110.5 kg)  11/15/18 246 lb 3.2 oz (111.7 kg)  09/10/18 239 lb 8 oz (108.6 kg)    Physical Exam  Nursing note and vitals reviewed. Constitutional: She is oriented to person, place, and time. She appears well-developed. No distress.  HENT:  Head: Normocephalic and atraumatic.  Mouth/Throat: Oropharynx is clear and moist and mucous membranes are normal.  Eyes: Pupils are equal, round, and reactive to light. Conjunctivae are normal.  Cardiovascular: Normal rate and regular rhythm.  No murmur heard. Pulses:      Dorsalis pedis pulses are 2+ on the right side and 2+ on the left side.  Respiratory: Effort normal and breath sounds normal. No respiratory distress.  GI: Soft. She exhibits no mass. There is no hepatomegaly. There is no abdominal tenderness.  Musculoskeletal:        General: Edema (Trace pitting LE edema,bilateral.) present.  Lymphadenopathy:    She has no cervical adenopathy.  Neurological: She is alert and oriented to person, place, and time. She has normal strength. No cranial nerve deficit. Gait normal.  Skin: Skin is warm. Rash noted. No erythema.  Psychiatric: She has a normal mood and affect.  Well groomed, good eye contact.   Diabetic Foot Exam - Simple   Simple Foot Form Diabetic Foot exam  was performed with the following findings: Yes 05/16/2019  2:23 PM  Visual Inspection No deformities, no ulcerations, no other skin breakdown bilaterally: Yes Sensation Testing Intact to touch and monofilament testing bilaterally: Yes Pulse Check Posterior Tibialis and Dorsalis pulse intact bilaterally: Yes Comments     ASSESSMENT AND Herring:  [name] was seen today for follow-up and rash.  Diagnoses and all orders for this visit:  Orders Placed This Encounter  Procedures  . Pneumococcal conjugate vaccine 13-valent IM  . Basic metabolic panel  . Microalbumin / creatinine urine ratio  . Hemoglobin A1c  . Fructosamine  . CBC   Lab Results  Component Value Date   WBC 5.1 05/16/2019   HGB 13.4 05/16/2019   HCT 40.2 05/16/2019   MCV 87.1 05/16/2019   PLT 227.0 05/16/2019   Lab Results  Component Value Date   HGBA1C 6.6 (H) 05/16/2019  Lab Results  Component Value Date   MICROALBUR <0.7 05/16/2019    Lab Results  Component Value Date   CREATININE 0.87 05/16/2019   BUN 22 05/16/2019   NA 142 05/16/2019   K 3.7 05/16/2019   CL 102 05/16/2019   CO2 30 05/16/2019    Essential hypertension Adequately controlled. No changes in current management. Low salt diet to continue. Eye exam up to date. F/U in 6 months, before if needed.  -     Basic metabolic panel  Anxiety disorder, unspecified type Stable. No changes in Lorazepam,continue 0.5 mg daily prn.  Hypokalemia No changes in current management, will follow BMP done today and will give further recommendations accordingly.  -     Basic metabolic panel  Type 2 diabetes mellitus with other specified complication, without long-term current use of insulin ([place]) HgA1C was at goal. Continue non pharmacologic treatment. Regular exercise as tolerated and healthy diet with avoidance of added sugar food intake is an important part of treatment and recommended. Annual eye exam, periodic dental and foot care  recommended. F/U in 5-6 months  -     Basic metabolic panel -     Microalbumin / creatinine urine ratio -     Hemoglobin A1c -     Fructosamine  Medicare annual wellness visit, subsequent We discussed the importance of staying active, physically and mentally, as well as the benefits of a healthy/balance diet. Low impact exercise that involve stretching and strengthing are ideal. Vaccines updated today. We discussed preventive screening for the next 5-10 years, summery of recommendations given in AVS:  Colonoscopy due in 2025 Mammogram in 07/2019. DEXA ordered, to be done with mammogram.  Advance directives and end of life discussed, she does not have a health power of attorney. Strongly recommend having an end life coversation. Web site where she can find more information in this regard on AVS.    Contact dermatitis due to poison ivy Improving. Recommend topical steroid bid as needed for up to 14 days.  -     triamcinolone cream (KENALOG) 0.1 %; Apply 1 application topically 2 (two) times daily.  Need for vaccination against Streptococcus pneumoniae using pneumococcal conjugate vaccine 13 -     Pneumococcal conjugate vaccine 13-valent IM  Bleeding hemorrhoids This seems to be a chronic problem. She was evaluated by Dr [name] in 09/2018.  IBS constipation and diarrhea can also aggravate problem.  -     CBC  Environmental allergies Problem is well controlled. No changes in current management.    Return in about 6 months (around 11/16/2019) for F/U.    -[name] was advised to return sooner than planned today if new concerns arise.       Gwenette Wellons G. [name], MD  [facility]. [place] office.

## 2019-05-16 NOTE — Patient Instructions (Signed)
  [name] , Thank you for taking time to come for your Medicare Wellness Visit. I appreciate your ongoing commitment to your health goals. Please review the following plan we discussed and let me know if I can assist you in the future.   These are the goals we discussed: Goals    . Patient Stated     Plan a trip and travel when you can.        This is a list of the screening recommended for you and due dates:  Health Maintenance  Topic Date Due  . DEXA scan (bone density measurement)  07/25/2018  . Pneumonia vaccines (1 of 2 - PCV13) 07/25/2018  . Complete foot exam   02/02/2019  . Urine Protein Check  02/02/2019  . Flu Shot  05/10/2019  . Hemoglobin A1C  05/16/2019  . Tetanus Vaccine  05/16/2019*  . Eye exam for diabetics  06/22/2019  . Pap Smear  04/27/2020  . Mammogram  08/09/2020  . Colon Cancer Screening  10/17/2023  .  Hepatitis C: One time screening is recommended by Center for Disease Control  (CDC) for  adults born from [birth year] through 1963.   Completed  . HIV Screening  Completed  *Topic was postponed. The date shown is not the original due date.     A few tips:  -As we age balance is not as good as it was, so there is a higher risks for falls. Please remove small rugs and furniture that is "in your way" and could increase the risk of falls. Stretching exercises may help with fall prevention: Yoga and Tai Chi are some examples. Low impact exercise is better, so you are not very achy the next day.  -Sun screen and avoidance of direct sun light recommended. Caution with dehydration, if working outdoors be sure to drink enough fluids.  - Some medications are not safe as we age, increases the risk of side effects and can potentially interact with other medication you are also taken;  including some of over the counter medications. Be sure to let me know when you start a new medication even if it is a dietary/vitamin supplement.   -Healthy diet low in red meet/animal fat  and sugar + regular physical activity is recommended.       Screening schedule for the next 5-10 years:  Colonoscopy 10/2023 Glaucoma screening/eye exam every 1-2 years. Mammogram for breast cancer screening annually.  Flu vaccine annually. Fall prevention   Advance directives:  Please see a lawyer and/or go to this website to help you with advanced directives and designating a health care power of attorney so that your wishes will be followed should you become too ill to make your own medical decisions.  [email or web address]

## 2019-05-18 ENCOUNTER — Other Ambulatory Visit: Payer: Self-pay | Admitting: Family Medicine

## 2019-05-18 MED ORDER — ATENOLOL 25 MG PO TABS: 25.0000 mg | ORAL_TABLET | Freq: Every day | ORAL | 2 refills | Status: DC

## 2019-05-18 MED ORDER — AZELASTINE HCL 0.1 % NA SOLN: 2.0000 | Freq: Two times a day (BID) | NASAL | 11 refills | Status: DC

## 2019-05-18 MED ORDER — AMLODIPINE BESYLATE 5 MG PO TABS: 5.0000 mg | ORAL_TABLET | Freq: Every day | ORAL | 2 refills | Status: DC

## 2019-05-22 LAB — FRUCTOSAMINE: Fructosamine: 226 umol/L (ref 205–285)

## 2019-05-26 ENCOUNTER — Other Ambulatory Visit: Payer: Self-pay | Admitting: Family Medicine

## 2019-05-26 DIAGNOSIS — Z1231 Encounter for screening mammogram for malignant neoplasm of breast: Secondary | ICD-10-CM

## 2019-06-19 ENCOUNTER — Other Ambulatory Visit: Payer: Self-pay | Admitting: Family Medicine

## 2019-07-04 ENCOUNTER — Ambulatory Visit: Payer: Medicare Other

## 2019-07-16 ENCOUNTER — Other Ambulatory Visit: Payer: Self-pay | Admitting: *Deleted

## 2019-07-16 MED ORDER — POTASSIUM CHLORIDE ER 10 MEQ PO TBCR: 20.0000 meq | EXTENDED_RELEASE_TABLET | Freq: Every day | ORAL | 0 refills | Status: DC

## 2019-08-15 ENCOUNTER — Ambulatory Visit: Payer: Medicare Other

## 2019-08-15 ENCOUNTER — Telehealth: Payer: Self-pay | Admitting: *Deleted

## 2019-08-15 ENCOUNTER — Other Ambulatory Visit: Payer: Medicare Other

## 2019-08-15 ENCOUNTER — Encounter: Payer: Self-pay | Admitting: Family Medicine

## 2019-08-15 NOTE — Telephone Encounter (Signed)
Please advise. I do not see orders for bone density test.

## 2019-08-15 NOTE — Telephone Encounter (Signed)
Copied from [place] [id number]. Topic: General - Inquiry >> Aug 15, 2019  8:48 AM Sheran Luz wrote: Patient requesting call back from CMA to discuss bone density scan. She states she was under the impression that she was to have that at [facility] when she was getting mammogram.

## 2019-08-18 NOTE — Telephone Encounter (Signed)
Reviewing records it seems like mammogram and DEXA have been order on 05/26/2019 and 05/18/2019 respectively. Thanks, BJ

## 2019-08-22 NOTE — Telephone Encounter (Signed)
Pt is calling back and would like results of her bone density test

## 2019-08-22 NOTE — Telephone Encounter (Signed)
Spoke with patient informed patient that I do not see results. Informed patient that once PCP reviews results we will give her a call. Per patient Test was done last Friday 08/15/2019.

## 2019-08-26 NOTE — Telephone Encounter (Signed)
DEXA is within normal limits. Thanks, BJ

## 2019-08-27 NOTE — Telephone Encounter (Signed)
ATC, Unable to leave voicemail. Please relay results below.  CRM created.

## 2019-09-18 ENCOUNTER — Other Ambulatory Visit: Payer: Self-pay | Admitting: Family Medicine

## 2019-09-21 ENCOUNTER — Other Ambulatory Visit: Payer: Self-pay | Admitting: Family Medicine

## 2019-10-07 ENCOUNTER — Ambulatory Visit: Payer: Self-pay

## 2019-10-07 NOTE — Telephone Encounter (Signed)
Pt. Reports last Friday she was transferring her Mom and pulled her left hamstring. Pain is worse - keeps her up at night, hurts when she goes up steps. Feels a dime-sized knot as well Reports she has varicose veins, "and maybe that's what the pain is coming from." Warm transfer to [facility] in the practice for a visit. Reason for Disposition . [1] MODERATE pain (e.g., interferes with normal activities, limping) AND [2] present > 3 days  Answer Assessment - Initial Assessment Questions 1. ONSET: "When did the pain start?"          Started Friday 2. LOCATION: "Where is the pain located?"      Left leg - back of thigh 3. PAIN: "How bad is the pain?"    (Scale 1-10; or mild, moderate, severe)   -  MILD (1-3): doesn't interfere with normal activities    -  MODERATE (4-7): interferes with normal activities (e.g., work or school) or awakens from sleep, limping    -  SEVERE (8-10): excruciating pain, unable to do any normal activities, unable to walk     Hurts to go up steps -  7-8 4. WORK OR EXERCISE: "Has there been any recent work or exercise that involved this part of the body?"      Yes 5. CAUSE: "What do you think is causing the leg pain?"     Pulling on her Mom for transfer 6. OTHER SYMPTOMS: "Do you have any other symptoms?" (e.g., chest pain, back pain, breathing difficulty, swelling, rash, fever, numbness, weakness)     Has a knot on back of thigh  -  Dime size 7. PREGNANCY: "Is there any chance you are pregnant?" "When was your last menstrual period?"     No  Protocols used: LEG PAIN-A-AH

## 2019-10-07 NOTE — Telephone Encounter (Signed)
Sending as [name]. Patient has an appointment tomorrow at 11:30am.

## 2019-10-08 ENCOUNTER — Ambulatory Visit: Payer: Medicare Other | Admitting: Family Medicine

## 2019-10-08 ENCOUNTER — Other Ambulatory Visit: Payer: Self-pay

## 2019-10-08 ENCOUNTER — Encounter: Payer: Self-pay | Admitting: Family Medicine

## 2019-10-08 VITALS — BP 110/68 | HR 60 | Temp 97.2°F | Ht 62.0 in | Wt 242.9 lb

## 2019-10-08 DIAGNOSIS — S76312A Strain of muscle, fascia and tendon of the posterior muscle group at thigh level, left thigh, initial encounter: Secondary | ICD-10-CM | POA: Diagnosis not present

## 2019-10-08 NOTE — Progress Notes (Signed)
Subjective:     Patient ID: [name], female   DOB: May 10, 1951, 68 y.o.   MRN: [id number]  HPI Ms. Krolick is seen with left hamstring pain.  She states that this first occurred Christmas Eve.  She was helping to lift her mother into wheelchair and was in awkward position reaching forward to lift her with her left hamstring stretched out.  She felt a pulling sensation.  She had some pain with ambulation proximal hamstring since then.  No definite weakness.  She has been unable to visualize the area but not aware of any bruising or obvious swelling.  She has some chronic low back pain which is unchanged.  This pain is different.  She just got some Aspercreme and has just started applying  Past Medical History:  Diagnosis Date  . Abdominal pain, epigastric 12/28/2008   Centricity Description: ABDOMINAL PAIN, EPIGASTRIC Qualifier: Diagnosis of  By: [name], [name]   Centricity Description: ABDOMINAL PAIN-EPIGASTRIC Qualifier: Diagnosis of  By: [name], [name]   . Allergic rhinitis 09/16/2013  . Allergy   . Anemia   . Anxiety   . Anxiety disorder, unspecified 07/06/2017  . Arthritis   . Blood in stool   . BMI 45.0-49.9, adult ([place]) 07/26/2016  . Class 3 obesity with serious comorbidity and body mass index (BMI) of 45.0 to 49.9 in adult 03/07/2017  . DDD (degenerative disc disease), lumbar   . DIVERTICULOSIS-COLON 12/28/2008   Qualifier: Diagnosis of  By: [name], [name]   . GERD 12/28/2008   Qualifier: Diagnosis of  By: [name], [name]   . GERD (gastroesophageal reflux disease)   . Headache   . Hyperglycemia 03/17/2014  . Hypertension   . Hypokalemia 07/26/2016  . IBS (irritable bowel syndrome)-C-D 07/26/2016  . Obesity   . Other and unspecified hyperlipidemia 03/17/2014  . PERSONAL HX COLONIC POLYPS 12/28/2008   Qualifier: Diagnosis of  By: [name], [name]   . Reflux esophagitis 12/28/2008   Qualifier: Diagnosis of  By: Julaine Hua CMA (AAMA),  [name]    . S/P lumbar spinal fusion 07/02/2012  . Tubulovillous adenoma of colon 2008   Past Surgical History:  Procedure Laterality Date  . ABDOMINAL HYSTERECTOMY    . COLONOSCOPY  2014  . laser surgery to remove scar     not sure of the year; after hyst  . RADIOLOGY WITH ANESTHESIA N/A 02/25/2015   Procedure: MRI OF LUMBAR SPINE;  Surgeon: Medication Radiologist, MD;  Location: [facility];  Service: Radiology;  Laterality: N/A;  DR. MAX COHEN/MRI  . SPINE SURGERY    . TONSILLECTOMY AND ADENOIDECTOMY      reports that she quit smoking about 21 years ago. Her smoking use included cigarettes. She has a 38.00 pack-year smoking history. She has never used smokeless tobacco. She reports that she does not drink alcohol or use drugs. family history includes Arthritis in her father; Breast cancer in her cousin and paternal aunt; Cancer in her maternal grandfather; Cervical cancer in her sister; Diabetes in her sister; Heart disease in her sister; Hyperlipidemia in her father, mother, and sister; Hypertension in her father, mother, and paternal grandmother; Pancreatic cancer in her maternal aunt. Allergies  Allergen Reactions  . Crestor [Rosuvastatin]   . Erythromycin     rash  . Lansoprazole   . Penicillins     rash  . Potassium-Containing Compounds Other (See Comments)    Stomach upset, can take pills, but not the packets   .  Robaxin [Methocarbamol]     Messed up stomach  . Sulfonamide Derivatives     rash  . Trental [Pentoxifylline Er]     Bad headache     Review of Systems  Constitutional: Negative for chills and fever.  Respiratory: Negative for cough and shortness of breath.   Cardiovascular: Negative for chest pain.  Gastrointestinal: Negative for abdominal pain.  Musculoskeletal: Positive for back pain.  Neurological: Negative for weakness and numbness.       Objective:   Physical Exam Vitals reviewed.  Constitutional:      Appearance: Normal appearance.  Cardiovascular:      Rate and Rhythm: Normal rate and regular rhythm.  Musculoskeletal:     Comments: Left hip reveals good range of motion.  No lateral tenderness.  She does have pain with knee flexion against resistance and hip flexion.  She has some tenderness over the proximal hamstring tendon.  No visible ecchymosis or swelling.  No warmth.  No hematoma.  Neurological:     Mental Status: She is alert.        Assessment:     Acute left hamstring strain    Plan:     -Recommend conservative treatment at this point with heat and gentle stretches.  She will try some topicals such as Aspercreme.  She needs to avoid nonsteroidals because of some chronic kidney issues -She is aware this may take several weeks to heal.  Eulas Post MD [facility]

## 2019-10-08 NOTE — Patient Instructions (Signed)
Hamstring Strain  A hamstring strain happens when the muscles in the back of the thighs (hamstring muscles) are overstretched or torn. The hamstring muscles are used in straightening the hips, bending the knees, and pulling back the legs. This injury is often called a pulled hamstring muscle. The tissue that connects the muscle to a bone (tendon) may also be affected. The severity of a hamstring strain may be rated in degrees or grades. First-degree (or grade 1) strains have the least amount of muscle tearing and pain. Second-degree and third-degree (grade 2 and 3) strains have increasingly more tearing and pain. What are the causes? This condition is caused by a sudden, violent force being placed on the hamstring muscles, stretching them too far. This often happens during activities that involve running, jumping, kicking, or weight lifting. What increases the risk? Hamstring strains are especially common in athletes. The following factors may also make you more likely to develop this condition:  Having low strength, endurance, or flexibility of the hamstring muscles.  Doing high-impact physical activity or sports.  Having poor physical fitness.  Having a previous leg injury.  Having tired (fatigued) muscles. What are the signs or symptoms? Symptoms of this condition include:  Pain in the back of the thigh.  Swelling.  Bruising.  Muscle spasms.  Trouble moving the affected muscle because of pain. For severe strains, you may feel popping or snapping in the back of your thigh when the injury occurs. How is this diagnosed? This condition is diagnosed based on your symptoms, your medical history, and a physical exam. How is this treated? Treatment for this condition usually involves:  Protecting, resting, icing, applying compression, and elevating the injured area (PRICE therapy).  Medicines. Your health care provider may recommend medicines to help reduce pain or  inflammation.  Doing exercises to regain strength and flexibility in the muscles. Your health care provider will tell you when it is okay to begin exercising. Follow these instructions at home: PRICE therapy Use PRICE therapy to promote muscle healing during the first 2-3 days after your injury, or as told by your health care provider.  Protect the muscle from being injured again.  Rest your injury. This usually involves limiting your normal activities and not using the injured hamstring muscle. Talk with your health care provider about how you should limit your activities.  Apply ice to the injured area: ? Put ice in a plastic bag. ? Place a towel between your skin and the bag. ? Leave the ice on for 20 minutes, 2-3 times a day. After the third day, switch to applying heat as told.  Put pressure (compression) on your injured hamstring by wrapping it with an elastic bandage. Be careful not to wrap it too tightly. That may interfere with blood circulation or may increase swelling.  Raise (elevate) your injured hamstring above the level of your heart as often as possible. When you are lying down, you can do this by putting a pillow under your thigh.  Activity  Begin exercising or stretching only as told by your health care provider.  Do not return to full activity level until your health care provider approves.  To help prevent muscle strains in the future, always warm up before exercising and stretch afterward. General instructions  Take over-the-counter and prescription medicines only as told by your health care provider.  If directed, apply heat to the affected area as often as told by your health care provider. Use the heat source that your   health care provider recommends, such as a moist heat pack or a heating pad. ? Place a towel between your skin and the heat source. ? Leave the heat on for 20-30 minutes. ? Remove the heat if your skin turns bright red. This is especially  important if you are unable to feel pain, heat, or cold. You may have a greater risk of getting burned.  Keep all follow-up visits as told by your health care provider. This is important. Contact a health care provider if you have:  Increasing pain or swelling in the injured area.  Numbness, tingling, or a significant loss of strength in the injured area. Get help right away if:  Your foot or your toes become cold or turn blue. Summary  A hamstring strain happens when the muscles in the back of the thighs (hamstring muscles) are overstretched or torn.  This injury can be caused by a sudden, violent force being placed on the hamstring muscles, causing them to stretch too far.  Symptoms include pain, swelling, and muscle spasms in the injured area.  Treatment includes what is called PRICE therapy: protecting, resting, icing, applying compression, and elevating the injured area. This information is not intended to replace advice given to you by your health care provider. Make sure you discuss any questions you have with your health care provider. Document Released: 06/20/2001 Document Revised: 09/07/2017 Document Reviewed: 08/23/2017 Elsevier Patient Education  2020 [employer].

## 2019-10-16 ENCOUNTER — Ambulatory Visit (INDEPENDENT_AMBULATORY_CARE_PROVIDER_SITE_OTHER): Payer: Medicare PPO | Admitting: Gastroenterology

## 2019-10-16 ENCOUNTER — Encounter: Payer: Self-pay | Admitting: Gastroenterology

## 2019-10-16 ENCOUNTER — Other Ambulatory Visit: Payer: Self-pay | Admitting: Family Medicine

## 2019-10-16 VITALS — BP 128/80 | HR 68 | Temp 98.1°F | Ht 61.0 in | Wt 243.0 lb

## 2019-10-16 DIAGNOSIS — K219 Gastro-esophageal reflux disease without esophagitis: Secondary | ICD-10-CM | POA: Diagnosis not present

## 2019-10-16 DIAGNOSIS — K921 Melena: Secondary | ICD-10-CM | POA: Diagnosis not present

## 2019-10-16 DIAGNOSIS — K582 Mixed irritable bowel syndrome: Secondary | ICD-10-CM | POA: Diagnosis not present

## 2019-10-16 MED ORDER — HYDROCORTISONE 2.5 % EX OINT: TOPICAL_OINTMENT | CUTANEOUS | 11 refills | Status: DC

## 2019-10-16 NOTE — Progress Notes (Signed)
    History of Present Illness: This is a 69 year old female with alternating diarrhea and constipation and hemorrhoids. Frequent rectal bleeding and prolapse with bowel movements requiring manual reduction since August. Denies weight loss, abdominal pain, change in stool caliber, melena, nausea, vomiting, dysphagia, reflux symptoms, chest pain.  Colonoscopy 10/2018: - Hemorrhoids found on perianal exam. - Severe diverticulosis in the entire examined colon. - Anal papilla(e) were hypertrophied. - Internal hemorrhoids.  Current Medications, Allergies, Past Medical History, Past Surgical History, Family History and Social History were reviewed in [employer] record.   Physical Exam: General: Well developed, well nourished, no acute distress Head: Normocephalic and atraumatic Eyes:  sclerae anicteric, EOMI Ears: Normal auditory acuity Mouth: No deformity or lesions Lungs: Clear throughout to auscultation Heart: Regular rate and rhythm; no murmurs, rubs or bruits Abdomen: Soft, non tender and non distended. No masses, hepatosplenomegaly or hernias noted. Normal Bowel sounds Rectal: prolapsing hemorrhoid vs anal papillae vs other lesion, external tags, mild anal canal tenderness, trace heme positive stool.  Musculoskeletal: Symmetrical with no gross deformities  Pulses:  Normal pulses noted Extremities: No clubbing, cyanosis, edema or deformities noted Neurological: Alert oriented x 4, grossly nonfocal Psychological:  Alert and cooperative. Normal mood and affect   Assessment and Recommendations:  1. IBS, alternating pattern.  Severe diverticulosis.  Continue Metamucil daily. Trial of a low FODMAP diet. At least 6 glasses of water daily. Miralax prn.   2. Prolapsing hemorrhoids vs anal papillae vs other lesion. Prep H cream bid prn. HC 2.5% cream bid prn. Rectal care instructions. Colorectal surgeon referral for further mgmt.    3.  GERD, well controlled.   Continue following antireflux measures.  Continue omeprazole 40 mg twice daily.

## 2019-10-16 NOTE — Patient Instructions (Addendum)
If you are age 67 or older, your body mass index should be between 23-30. Your Body mass index is 45.91 kg/m. If this is out of the aforementioned range listed, please consider follow up with your Primary Care Provider.  If you are age 12 or younger, your body mass index should be between 19-25. Your Body mass index is 45.91 kg/m. If this is out of the aformentioned range listed, please consider follow up with your Primary Care Provider.   Please purchase the following medications over the counter and take as directed:  Please use Preparation H cream twice daily.  We have sent the following medications to your pharmacy for you to pick up at your convenience:  Hydrocortisone 2.5 %  cream twice daily  You have been scheduled for an appointment with ____________ at [facility]. Your appointment is on ________ at _____. Please arrive at ________ for registration. Make certain to bring a list of current medications, including any over the counter medications or vitamins. Also bring your co-pay if you have one as well as your insurance cards. [facility] is located at [address], [address]. Should you need to reschedule your appointment, please contact them at [phone].  You have been given a low FODMAP diet to follow.

## 2019-10-20 ENCOUNTER — Ambulatory Visit: Payer: Medicare PPO | Attending: Internal Medicine

## 2019-10-20 DIAGNOSIS — Z20822 Contact with and (suspected) exposure to covid-19: Secondary | ICD-10-CM | POA: Diagnosis not present

## 2019-10-23 LAB — NOVEL CORONAVIRUS, NAA: SARS-CoV-2, NAA: NOT DETECTED

## 2019-10-31 ENCOUNTER — Ambulatory Visit: Payer: Medicare PPO | Attending: Internal Medicine

## 2019-10-31 DIAGNOSIS — Z23 Encounter for immunization: Secondary | ICD-10-CM

## 2019-10-31 NOTE — Progress Notes (Signed)
   Covid-19 Vaccination Clinic  Name:  [name]    MRN: [id number] DOB: 04/23/1951  10/31/2019  [name] was observed post Covid-19 immunization for 15 minutes without incidence. She was provided with Vaccine Information Sheet and instruction to access the V-Safe system.   [name] was instructed to call 911 with any severe reactions post vaccine: Marland Kitchen Difficulty breathing  . Swelling of your face and throat  . A fast heartbeat  . A bad rash all over your body  . Dizziness and weakness    Immunizations Administered    Name Date Dose VIS Date Route   Pfizer COVID-19 Vaccine 10/31/2019  3:32 PM 0.3 mL 09/19/2019 Intramuscular   Manufacturer: [place]   Lot: [id number]   [place]: [id number]

## 2019-11-14 ENCOUNTER — Telehealth: Payer: Self-pay

## 2019-11-14 NOTE — Telephone Encounter (Signed)
Patient is scheduled to see Dr. Dema Severin at [place] on 11/24/19 at 11:00am. Patient is aware.

## 2019-11-21 ENCOUNTER — Ambulatory Visit: Payer: Medicare PPO | Attending: Internal Medicine

## 2019-11-21 DIAGNOSIS — Z23 Encounter for immunization: Secondary | ICD-10-CM | POA: Insufficient documentation

## 2019-11-21 NOTE — Progress Notes (Signed)
   Covid-19 Vaccination Clinic  Name:  [name]    MRN: [id number] DOB: [birth year]  11/21/2019  [name] was observed post Covid-19 immunization for 15 minutes without incidence. She was provided with Vaccine Information Sheet and instruction to access the V-Safe system.   [name] was instructed to call 911 with any severe reactions post vaccine: Marland Kitchen Difficulty breathing  . Swelling of your face and throat  . A fast heartbeat  . A bad rash all over your body  . Dizziness and weakness    Immunizations Administered    Name Date Dose VIS Date Route   Pfizer COVID-19 Vaccine 11/21/2019  1:47 PM 0.3 mL 09/19/2019 Intramuscular   Manufacturer: [place]   Lot: [id number]   [place]: [id number]

## 2019-11-24 DIAGNOSIS — K648 Other hemorrhoids: Secondary | ICD-10-CM | POA: Diagnosis not present

## 2019-12-24 ENCOUNTER — Other Ambulatory Visit: Payer: Self-pay | Admitting: Family Medicine

## 2020-01-22 DIAGNOSIS — K649 Unspecified hemorrhoids: Secondary | ICD-10-CM | POA: Diagnosis not present

## 2020-01-26 ENCOUNTER — Other Ambulatory Visit: Payer: Self-pay

## 2020-01-27 ENCOUNTER — Encounter: Payer: Self-pay | Admitting: Family Medicine

## 2020-01-27 ENCOUNTER — Ambulatory Visit (INDEPENDENT_AMBULATORY_CARE_PROVIDER_SITE_OTHER): Payer: Medicare PPO | Admitting: Family Medicine

## 2020-01-27 VITALS — BP 124/80 | HR 60 | Temp 97.6°F | Resp 16 | Ht 61.0 in | Wt 242.2 lb

## 2020-01-27 DIAGNOSIS — F419 Anxiety disorder, unspecified: Secondary | ICD-10-CM | POA: Diagnosis not present

## 2020-01-27 DIAGNOSIS — E785 Hyperlipidemia, unspecified: Secondary | ICD-10-CM

## 2020-01-27 DIAGNOSIS — K582 Mixed irritable bowel syndrome: Secondary | ICD-10-CM | POA: Diagnosis not present

## 2020-01-27 DIAGNOSIS — I10 Essential (primary) hypertension: Secondary | ICD-10-CM

## 2020-01-27 DIAGNOSIS — E1169 Type 2 diabetes mellitus with other specified complication: Secondary | ICD-10-CM

## 2020-01-27 DIAGNOSIS — Z0000 Encounter for general adult medical examination without abnormal findings: Secondary | ICD-10-CM | POA: Diagnosis not present

## 2020-01-27 DIAGNOSIS — Z6841 Body Mass Index (BMI) 40.0 and over, adult: Secondary | ICD-10-CM | POA: Diagnosis not present

## 2020-01-27 DIAGNOSIS — E876 Hypokalemia: Secondary | ICD-10-CM

## 2020-01-27 LAB — COMPREHENSIVE METABOLIC PANEL
ALT: 16 U/L (ref 0–35)
AST: 18 U/L (ref 0–37)
Albumin: 4.4 g/dL (ref 3.5–5.2)
Alkaline Phosphatase: 73 U/L (ref 39–117)
BUN: 22 mg/dL (ref 6–23)
CO2: 30 mEq/L (ref 19–32)
Calcium: 10.3 mg/dL (ref 8.4–10.5)
Chloride: 101 mEq/L (ref 96–112)
Creatinine, Ser: 0.89 mg/dL (ref 0.40–1.20)
GFR: 76.66 mL/min (ref >60.00)
Glucose, Bld: 104 mg/dL — ABNORMAL HIGH (ref 70–99)
Potassium: 4.3 mEq/L (ref 3.5–5.1)
Sodium: 140 mEq/L (ref 135–145)
Total Bilirubin: 0.2 mg/dL (ref 0.2–1.2)
Total Protein: 6.8 g/dL (ref 6.0–8.3)

## 2020-01-27 LAB — LIPID PANEL
Cholesterol: 167 mg/dL (ref 0–200)
HDL: 55.6 mg/dL (ref >39.00)
LDL Cholesterol: 91 mg/dL (ref 0–99)
NonHDL: 111.67
Total CHOL/HDL Ratio: 3
Triglycerides: 105 mg/dL (ref 0.0–149.0)
VLDL: 21 mg/dL (ref 0.0–40.0)

## 2020-01-27 LAB — HEMOGLOBIN A1C: Hgb A1c MFr Bld: 6.5 % (ref 4.6–6.5)

## 2020-01-27 MED ORDER — DICYCLOMINE HCL 10 MG PO CAPS: 10.0000 mg | ORAL_CAPSULE | Freq: Three times a day (TID) | ORAL | 1 refills | Status: DC

## 2020-01-27 MED ORDER — PRAVASTATIN SODIUM 20 MG PO TABS: ORAL_TABLET | ORAL | 3 refills | Status: DC

## 2020-01-27 MED ORDER — SERTRALINE HCL 25 MG PO TABS: 25.0000 mg | ORAL_TABLET | Freq: Every day | ORAL | 2 refills | Status: DC

## 2020-01-27 MED ORDER — ATENOLOL 25 MG PO TABS: 25.0000 mg | ORAL_TABLET | Freq: Every day | ORAL | 2 refills | Status: DC

## 2020-01-27 MED ORDER — AMLODIPINE BESYLATE 5 MG PO TABS: 5.0000 mg | ORAL_TABLET | Freq: Every day | ORAL | 2 refills | Status: DC

## 2020-01-27 MED ORDER — HYDROCHLOROTHIAZIDE 25 MG PO TABS: 25.0000 mg | ORAL_TABLET | Freq: Every day | ORAL | 2 refills | Status: DC

## 2020-01-27 MED ORDER — POTASSIUM CHLORIDE ER 10 MEQ PO TBCR: 20.0000 meq | EXTENDED_RELEASE_TABLET | Freq: Every day | ORAL | 2 refills | Status: DC

## 2020-01-27 NOTE — Progress Notes (Signed)
HPI:   [name] is a 69 y.o. female, who is here today for her routine physical.  Last CPE: 04/27/2017.  Regular exercise 3 or more time per week: She has not been consistent due to pain. Following a healthy diet: Yes,plenty of greens,vegetable. She lives with her mother.  Chronic medical problems: DM II,chronic pain, anxiety,obesity,HLD,and HTN among some.  Pap smear 04/27/17. S/P hysterectomy. Hx of abnormal pap smears: Negative.  Immunization History  Administered Date(s) Administered  . Influenza Split 07/02/2012, 07/22/2013  . Influenza,inj,Quad PF,6+ Mos 06/16/2014, 07/26/2016, 07/06/2017, 07/09/2018  . PFIZER SARS-COV-2 Vaccination 10/31/2019, 11/21/2019  . Pneumococcal Conjugate-13 05/16/2019  . Pneumococcal Polysaccharide-23 04/27/2017  . Tdap 04/08/2008, 01/07/2018   Mammogram: 08/15/19, Bi-rads 1 Colonoscopy: 10/16/18, 5 years f/u was recommended. DEXA: 08/15/19  Hep C screening: Completed.  She has some concerns today and chronic disease management.  Diarrhea She would like to try something for diarrhea,Bentyl. Alternating diarrhea and constipation. She takes Miralax 3 times per week. She does not feel like this is aggravating diarrhea. This is a chronic problem. It is aggravated by certain foods,greens. She can have 4-5 stools in a day.  She has not noted blood in stool or melena. Abdominal cramps alleviated by defecation.  HypoK+: She is on KLO 10 meq bid.  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 Problem getting worse. Negative for depression. She is on Lorazepam 0.5 mg 1/2 tab at bedtime, occasionally she takes another 1/2 during the day if needed.   DM II: Dx'ed in 2018. She is on non pharmacologic treatment.  Lab Results  Component Value Date   HGBA1C 6.6 (H) 05/16/2019   Lab Results  Component Value Date   MICROALBUR <0.7 05/16/2019   MICROALBUR <0.7 02/01/2018   She got 2 steroid injections in the past 3 months, this  affects BS readings. BS's low 100's. Negative for polydipsia,polyuria, or polyphagia.  HOO:[id number] 20 mg 3 times per week because muscle aches.  Lab Results  Component Value Date   CHOL 153 11/15/2018   HDL 43.70 11/15/2018   LDLCALC 91 11/15/2018   TRIG 90.0 11/15/2018   CHOLHDL 4 11/15/2018   HTN: BP readings 120's/70's. Negative for severe/frequent headache, chest pain, dyspnea, palpitation,focal weakness, or worsening edema.  Component     Latest Ref Rng & Units 05/16/2019  Sodium     135 - 145 mEq/L 142  Potassium     3.5 - 5.1 mEq/L 3.7  Chloride     96 - 112 mEq/L 102  CO2     19 - 32 mEq/L 30  Glucose     70 - 99 mg/dL 120 (H)  BUN     6 - 23 mg/dL 22  Creatinine     0.40 - 1.20 mg/dL 0.87  Albumin     3.5 - 5.2 g/dL   Calcium     8.4 - 10.5 mg/dL 10.1    Review of Systems  Constitutional: Positive for fatigue. Negative for activity change, appetite change and fever.  HENT: Negative for mouth sores, nosebleeds and sore throat.   Eyes: Negative for redness and visual disturbance.  Respiratory: Negative for cough and wheezing.   Cardiovascular: Negative for leg swelling.  Gastrointestinal: Negative for abdominal distention, nausea and vomiting.       Negative for changes in bowel habits.  Endocrine: Negative for cold intolerance and heat intolerance.  Genitourinary: Negative for decreased urine volume, dysuria and hematuria.  Musculoskeletal: Positive for back pain. Negative for gait problem.  Skin: Negative for rash and wound.  Allergic/Immunologic: Positive for environmental allergies.  Neurological: Negative for syncope, facial asymmetry and weakness.  Psychiatric/Behavioral: Positive for sleep disturbance. Negative for confusion. The patient is nervous/anxious.   All other systems reviewed and are negative.  Current Outpatient Medications on File Prior to Visit  Medication Sig Dispense Refill  . Aspirin-Caffeine 845-65 MG PACK Take 1-2 packets by  mouth daily as needed (headache).    Marland Kitchen azelastine (ASTELIN) 0.1 % nasal spray Place 2 sprays into both nostrils 2 (two) times daily. Use in each nostril as directed 30 mL 11  . calcium carbonate (TUMS EX) 750 MG chewable tablet Chew 2 tablets by mouth daily as needed for heartburn.    . cetirizine (ZYRTEC) 10 MG tablet Take 10 mg by mouth daily. Alternating with Allegra    . cyclobenzaprine (FLEXERIL) 10 MG tablet Take 2.5 mg by mouth daily as needed (pain level 8 or greater).     Marland Kitchen diclofenac sodium (VOLTAREN) 1 % GEL APPLY (4G) BY TOPICAL ROUTE 4 TIMES EVERY DAY TO THE AFFECTED AREA(S) (WC PA)  1  . diphenhydrAMINE (BENADRYL) 25 MG tablet Take 25 mg by mouth daily as needed.    . fexofenadine (ALLEGRA) 60 MG tablet Take 60 mg by mouth daily.    . fluticasone (FLONASE) 50 MCG/ACT nasal spray Use 2 spray(s) in each nostril once daily 48 g 0  . guaiFENesin (MUCINEX) 600 MG 12 hr tablet Take 300 mg by mouth as needed.    Marland Kitchen HYDROcodone-acetaminophen (NORCO/VICODIN) 5-325 MG tablet Take 0.5 tablets by mouth daily as needed (pain level 8 or greater). 30 tablet 0  . hydrocortisone 2.5 % ointment APPLY TWICE A DAY 60 g 11  . Menthol-Camphor (ICY HOT ARTHRITIS PAIN RELIEF EX) Apply 1 application topically as needed.    . Multiple Vitamins-Minerals (MULTI-VITAMIN GUMMIES PO) Take by mouth as needed.    Marland Kitchen omeprazole (PRILOSEC) 40 MG capsule Take 1 capsule (40 mg total) by mouth daily. (Patient taking differently: Take 80 mg by mouth daily. ) 60 capsule 11  . Polyethylene Glycol 3350 (MIRALAX PO) Take by mouth.    . triamcinolone cream (KENALOG) 0.1 % Apply 1 application topically 2 (two) times daily. 30 g 1  . trolamine salicylate (ASPERCREME) 10 % cream Apply 1 application topically as needed for muscle pain.     No current facility-administered medications on file prior to visit.    Past Medical History:  Diagnosis Date  . Abdominal pain, epigastric 12/28/2008   Centricity Description: ABDOMINAL  PAIN, EPIGASTRIC Qualifier: Diagnosis of  By: [name], [name]   Centricity Description: ABDOMINAL PAIN-EPIGASTRIC Qualifier: Diagnosis of  By: [name], [name]   . Allergic rhinitis 09/16/2013  . Allergy   . Anemia   . Anxiety   . Anxiety disorder, unspecified 07/06/2017  . Arthritis   . Blood in stool   . BMI 45.0-49.9, adult ([place]) 07/26/2016  . Class 3 obesity with serious comorbidity and body mass index (BMI) of 45.0 to 49.9 in adult 03/07/2017  . DDD (degenerative disc disease), lumbar   . DIVERTICULOSIS-COLON 12/28/2008   Qualifier: Diagnosis of  By: [name], [name]   . GERD 12/28/2008   Qualifier: Diagnosis of  By: [name], [name]   . GERD (gastroesophageal reflux disease)   . Headache   . Hyperglycemia 03/17/2014  . Hypertension   . Hypokalemia 07/26/2016  . IBS (irritable bowel syndrome)-C-D 07/26/2016  . Obesity   . Other  and unspecified hyperlipidemia 03/17/2014  . PERSONAL HX COLONIC POLYPS 12/28/2008   Qualifier: Diagnosis of  By: [name], [name]   . Reflux esophagitis 12/28/2008   Qualifier: Diagnosis of  By: Julaine Hua CMA (AAMA), [name]    . S/P lumbar spinal fusion 07/02/2012  . Tubulovillous adenoma of colon 2008    Past Surgical History:  Procedure Laterality Date  . ABDOMINAL HYSTERECTOMY    . COLONOSCOPY  2014  . laser surgery to remove scar     not sure of the year; after hyst  . RADIOLOGY WITH ANESTHESIA N/A 02/25/2015   Procedure: MRI OF LUMBAR SPINE;  Surgeon: Medication Radiologist, MD;  Location: [facility];  Service: Radiology;  Laterality: N/A;  DR. MAX COHEN/MRI  . SPINE SURGERY    . TONSILLECTOMY AND ADENOIDECTOMY      Allergies  Allergen Reactions  . Crestor [Rosuvastatin]   . Erythromycin     rash  . Lansoprazole   . Penicillins     rash  . Potassium-Containing Compounds Other (See Comments)    Stomach upset, can take pills, but not the packets   . Robaxin [Methocarbamol]     Messed up stomach  . Sulfonamide  Derivatives     rash  . Trental [Pentoxifylline Er]     Bad headache    Family History  Problem Relation Age of Onset  . Hypertension Mother   . Hyperlipidemia Mother   . Hyperlipidemia Father   . Hypertension Father   . Arthritis Father   . Hyperlipidemia Sister   . Cervical cancer Sister   . Diabetes Sister   . Heart disease Sister   . Cancer Maternal Grandfather   . Hypertension Paternal Grandmother   . Pancreatic cancer Maternal Aunt   . Breast cancer Paternal Aunt   . Breast cancer Cousin     Social History   Socioeconomic History  . Marital status: Widowed    Spouse name: Not on file  . Number of children: Not on file  . Years of education: Not on file  . Highest education level: Not on file  Occupational History  . Not on file  Tobacco Use  . Smoking status: Former Smoker    Packs/day: 1.00    Years: 38.00    Pack years: 38.00    Types: Cigarettes    Quit date: 10/28/1997    Years since quitting: 22.2  . Smokeless tobacco: Never Used  Substance and Sexual Activity  . Alcohol use: No  . Drug use: No  . Sexual activity: Never  Other Topics Concern  . 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   Social Determinants of Health   Financial Resource Strain:   . Difficulty of Paying Living Expenses:   Food Insecurity:   . Worried About Charity fundraiser in the Last Year:   . Arboriculturist in the Last Year:   Transportation Needs:   . Film/video editor (Medical):   Marland Kitchen Lack of Transportation (Non-Medical):   Physical Activity:   . Days of Exercise per Week:   . Minutes of Exercise per Session:   Stress:   . Feeling of Stress :   Social Connections:   . Frequency of Communication with Friends and Family:   . Frequency of Social Gatherings with Friends and Family:   . Attends Religious Services:   . Active Member of Clubs or Organizations:   . Attends Archivist  Meetings:   Marland Kitchen Marital Status:      Vitals:   01/27/20 0905  BP: 124/80  Pulse: 60  Resp: 16  Temp: 97.6 F (36.4 C)  SpO2: 97%   Body mass index is 45.77 kg/m.  Wt Readings from Last 3 Encounters:  01/27/20 242 lb 4 oz (109.9 kg)  10/16/19 243 lb (110.2 kg)  10/08/19 242 lb 14.4 oz (110.2 kg)    Physical Exam  Nursing note and vitals reviewed. Constitutional: She is oriented to person, place, and time. She appears well-developed. No distress.  HENT:  Head: Normocephalic and atraumatic.  Right Ear: Hearing, tympanic membrane, external ear and ear canal normal.  Left Ear: Hearing, tympanic membrane, external ear and ear canal normal.  Mouth/Throat: Uvula is midline, oropharynx is clear and moist and mucous membranes are normal.  Eyes: Pupils are equal, round, and reactive to light. Conjunctivae and EOM are normal.  Neck: No tracheal deviation present.  Cardiovascular: Normal rate and regular rhythm.  Occasional extrasystoles (X 1-2) are present.  No murmur heard. Pulses:      Dorsalis pedis pulses are 2+ on the right side and 2+ on the left side.  Respiratory: Effort normal and breath sounds normal. No respiratory distress. No breast swelling or tenderness.  GI: Soft. She exhibits no mass. There is no hepatomegaly. There is no abdominal tenderness.  Genitourinary:    Genitourinary Comments: Breast: No masses,skin changes,or nipple discharge.   Musculoskeletal:        General: Edema (Trace pitting LE edema, bilateral.) present.     Comments: No major deformity or signs of synovitis appreciated.  Lymphadenopathy:    She has no cervical adenopathy.    She has no axillary adenopathy.  Neurological: She is alert and oriented to person, place, and time. She has normal strength. No cranial nerve deficit. Coordination and gait normal.  Reflex Scores:      Bicep reflexes are 2+ on the right side and 2+ on the left side.      Patellar reflexes are 2+ on the right side and 2+ on the left side. Skin: Skin is  warm. No rash noted. No erythema.  Psychiatric: She has a normal mood and affect.  Well groomed, good eye contact.   ASSESSMENT AND PLAN:  [name] was here today annual physical examination and chronic disease management.  Orders Placed This Encounter  Procedures  . Lipid panel  . Hemoglobin A1c  . Comprehensive metabolic panel    Lab Results  Component Value Date   CHOL 167 01/27/2020   HDL 55.60 01/27/2020   LDLCALC 91 01/27/2020   TRIG 105.0 01/27/2020   CHOLHDL 3 01/27/2020   Lab Results  Component Value Date   HGBA1C 6.5 01/27/2020   Lab Results  Component Value Date   CREATININE 0.89 01/27/2020   BUN 22 01/27/2020   NA 140 01/27/2020   K 4.3 01/27/2020   CL 101 01/27/2020   CO2 30 01/27/2020    Lab Results  Component Value Date   ALT 16 01/27/2020   AST 18 01/27/2020   ALKPHOS 73 01/27/2020   BILITOT 0.2 01/27/2020    Routine general medical examination at a health care facility We discussed the importance of regular physical activity and healthy diet for prevention of chronic illness and/or complications. Preventive guidelines reviewed. Vaccination up to date.  Ca++ and vit D supplementation to continue. Next CPE in a year.  Type 2 diabetes mellitus with other specified complication, without  long-term current use of insulin ([place]) HgA1C has been at goal. Continue non pharmacologic treatment. Regular exercise and healthy diet with avoidance of added sugar food intake is an important part of treatment and recommended. Annual eye exam, periodic dental and foot care recommended. F/U in 5-6 months  Essential hypertension BP adequately controlled. Continue low salt diet and monitoring BP home. Eye exam current.  -     hydrochlorothiazide (HYDRODIURIL) 25 MG tablet; Take 1 tablet (25 mg total) by mouth daily. -     amLODipine (NORVASC) 5 MG tablet; Take 1 tablet (5 mg total) by mouth daily. -     atenolol (TENORMIN) 25 MG tablet;  Take 1 tablet (25 mg total) by mouth daily.  Anxiety disorder, unspecified type Getting worse. She agrees with trying low dose SSRI. Sertraline side effects discussed. No changes in Lorazepam dose.  Instructed to let me know in 8 weeks if medication is helping or if any side effects. Warning signs discussed.  -     sertraline (ZOLOFT) 25 MG tablet; Take 1 tablet (25 mg total) by mouth daily.  Hyperlipidemia associated with type 2 diabetes mellitus (HCC) No changes in current management, further recommendations according to FLP results.  -     pravastatin (PRAVACHOL) 20 MG tablet; Take by mouth twice a week  Morbid obesity with BMI of 45.0-49.9, adult ([place]) We discussed benefits of wt loss as well as adverse effects of obesity. Consistency with healthy diet and physical activity recommended. Low impact exercise.  Irritable bowel syndrome with both constipation and diarrhea Dietary recommendations discussed. Side effects of Bentyl reviewed.  -     dicyclomine (BENTYL) 10 MG capsule; Take 1 capsule (10 mg total) by mouth 3 (three) times daily before meals.  Hypokalemia Continue current management.  -     potassium chloride (KLOR-CON) 10 MEQ tablet; Take 2 tablets (20 mEq total) by mouth daily.   Return in about 6 months (around 07/28/2020).   Azhar Yogi G. [name], MD  [facility]. [place] office.   A few things to remember from today's visit:   Essential hypertension - Plan: Comprehensive metabolic panel, amLODipine (NORVASC) 5 MG tablet, atenolol (TENORMIN) 25 MG tablet  Type 2 diabetes mellitus with other specified complication, without long-term current use of insulin ([place]), Chronic - Plan: Hemoglobin A1c, Comprehensive metabolic panel  Anxiety disorder, unspecified type  Hyperlipidemia associated with type 2 diabetes mellitus ([place]) - Plan: Lipid panel  Morbid obesity with BMI of 45.0-49.9, adult (HCC)  Irritable bowel syndrome with both constipation and  diarrhea  Bentyl 10 mg added today. No changes in the rest.   Please be sure medication list is accurate. If a new problem present, please set up appointment sooner than planned today.   A few tips:  -As we age balance is not as good as it was, so there is a higher risks for falls. Please remove small rugs and furniture that is "in your way" and could increase the risk of falls. Stretching exercises may help with fall prevention: Yoga and Tai Chi are some examples. Low impact exercise is better, so you are not very achy the next day.  -Sun screen and avoidance of direct sun light recommended. Caution with dehydration, if working outdoors be sure to drink enough fluids.  - Some medications are not safe as we age, increases the risk of side effects and can potentially interact with other medication you are also taken;  including some of over the counter medications. Be sure  to let me know when you start a new medication even if it is a dietary/vitamin supplement.   -Healthy diet low in red meet/animal fat and sugar + regular physical activity is recommended.

## 2020-01-27 NOTE — Patient Instructions (Addendum)
A few things to remember from today's visit:   Essential hypertension - Plan: Comprehensive metabolic panel, amLODipine (NORVASC) 5 MG tablet, atenolol (TENORMIN) 25 MG tablet  Type 2 diabetes mellitus with other specified complication, without long-term current use of insulin ([place]), Chronic - Plan: Hemoglobin A1c, Comprehensive metabolic panel  Anxiety disorder, unspecified type  Hyperlipidemia associated with type 2 diabetes mellitus ([place]) - Plan: Lipid panel  Morbid obesity with BMI of 45.0-49.9, adult (HCC)  Irritable bowel syndrome with both constipation and diarrhea  Bentyl 10 mg added today. No changes in the rest.   Please be sure medication list is accurate. If a new problem present, please set up appointment sooner than planned today.   A few tips:  -As we age balance is not as good as it was, so there is a higher risks for falls. Please remove small rugs and furniture that is "in your way" and could increase the risk of falls. Stretching exercises may help with fall prevention: Yoga and Tai Chi are some examples. Low impact exercise is better, so you are not very achy the next day.  -Sun screen and avoidance of direct sun light recommended. Caution with dehydration, if working outdoors be sure to drink enough fluids.  - Some medications are not safe as we age, increases the risk of side effects and can potentially interact with other medication you are also taken;  including some of over the counter medications. Be sure to let me know when you start a new medication even if it is a dietary/vitamin supplement.   -Healthy diet low in red meet/animal fat and sugar + regular physical activity is recommended.

## 2020-01-29 ENCOUNTER — Encounter: Payer: Self-pay | Admitting: Family Medicine

## 2020-01-29 MED ORDER — LORAZEPAM 0.5 MG PO TABS: 0.2500 mg | ORAL_TABLET | Freq: Every day | ORAL | 1 refills | Status: DC | PRN

## 2020-01-30 ENCOUNTER — Telehealth: Payer: Self-pay | Admitting: Family Medicine

## 2020-01-30 NOTE — Telephone Encounter (Signed)
Rx was sent after her last visit, 01/29/20. Thanks, BJ

## 2020-01-30 NOTE — Telephone Encounter (Signed)
Noted  

## 2020-01-30 NOTE — Telephone Encounter (Signed)
Message Routed to PCP for review and approval. 

## 2020-01-30 NOTE — Telephone Encounter (Signed)
Pt call and stated that she can't take the new medicine that was prescribe for her. Pt stated she want dr. [name] to sent a refill on  LORazepam (ATIVAN) 0.5 MG tablet sent to  [facility] [place] Phone:  [phone]  Fax:  [phone]

## 2020-02-06 ENCOUNTER — Telehealth: Payer: Self-pay | Admitting: Family Medicine

## 2020-02-06 ENCOUNTER — Encounter: Payer: Self-pay | Admitting: Family Medicine

## 2020-02-06 ENCOUNTER — Encounter: Payer: Self-pay | Admitting: *Deleted

## 2020-02-06 NOTE — Telephone Encounter (Signed)
Patient notified through my chart.

## 2020-02-06 NOTE — Telephone Encounter (Signed)
Potassium was in normal range last time it was checked. If she thinks potassium supplementation is aggravating cramps, she can discontinue and we can recheck potassium in 3 to 4 weeks. Thanks, BJ

## 2020-02-06 NOTE — Telephone Encounter (Signed)
Please advise 

## 2020-02-06 NOTE — Telephone Encounter (Signed)
Pt is having a lot of leg cramps and can hardly walk at times. Pt is not sure if she needs to stop taking her potassium pills or if she needs to be seen in the office? Thanks

## 2020-03-10 ENCOUNTER — Other Ambulatory Visit: Payer: Self-pay

## 2020-03-10 ENCOUNTER — Telehealth (INDEPENDENT_AMBULATORY_CARE_PROVIDER_SITE_OTHER): Payer: Medicare PPO | Admitting: Family Medicine

## 2020-03-10 DIAGNOSIS — J019 Acute sinusitis, unspecified: Secondary | ICD-10-CM | POA: Diagnosis not present

## 2020-03-10 MED ORDER — DOXYCYCLINE HYCLATE 100 MG PO CAPS: 100.0000 mg | ORAL_CAPSULE | Freq: Two times a day (BID) | ORAL | 0 refills | Status: DC

## 2020-03-10 NOTE — Progress Notes (Signed)
Patient ID: [name], female   DOB: April 02, 1951, 69 y.o.   MRN: [id number]  This visit type was conducted due to national recommendations for restrictions regarding the COVID-19 pandemic in an effort to limit this patient's exposure and mitigate transmission in our community.   Virtual Visit via Telephone Note  I connected with [name] on 03/10/20 at  8:15 AM EDT by telephone and verified that I am speaking with the correct person using two identifiers.   I discussed the limitations, risks, security and privacy concerns of performing an evaluation and management service by telephone and the availability of in person appointments. I also discussed with the patient that there may be a patient responsible charge related to this service. The patient expressed understanding and agreed to proceed.  Location patient: home Location provider: work or home office Participants present for the call: patient, provider Patient did not have a visit in the prior 7 days to address this/these issue(s).   History of Present Illness:  [name] is seen today via virtual phone follow-up complaining of over 1 week history of "sinus infection".  She states she gets this about once a year.  She has had some facial pain frontal and maxillary area with some bloody and thick yellow drainage for over a week and symptoms are progressive.  She has had some intermittent headaches.  No fever.  No cough.  Frequent postnasal drip symptoms.  Denies any seasonal allergies this time of year.  She has had her Covid vaccinations.  She has some increased fatigue.  She is allergic to penicillin and sulfa.  Past Medical History:  Diagnosis Date  . Abdominal pain, epigastric 12/28/2008   Centricity Description: ABDOMINAL PAIN, EPIGASTRIC Qualifier: Diagnosis of  By: [name], [name]   Centricity Description: ABDOMINAL PAIN-EPIGASTRIC Qualifier: Diagnosis of  By: [name], [name]   . Allergic  rhinitis 09/16/2013  . Allergy   . Anemia   . Anxiety   . Anxiety disorder, unspecified 07/06/2017  . Arthritis   . Blood in stool   . BMI 45.0-49.9, adult ([place]) 07/26/2016  . Class 3 obesity with serious comorbidity and body mass index (BMI) of 45.0 to 49.9 in adult 03/07/2017  . DDD (degenerative disc disease), lumbar   . DIVERTICULOSIS-COLON 12/28/2008   Qualifier: Diagnosis of  By: [name], [name]   . GERD 12/28/2008   Qualifier: Diagnosis of  By: [name], [name]   . GERD (gastroesophageal reflux disease)   . Headache   . Hyperglycemia 03/17/2014  . Hypertension   . Hypokalemia 07/26/2016  . IBS (irritable bowel syndrome)-C-D 07/26/2016  . Obesity   . Other and unspecified hyperlipidemia 03/17/2014  . PERSONAL HX COLONIC POLYPS 12/28/2008   Qualifier: Diagnosis of  By: [name], [name]   . Reflux esophagitis 12/28/2008   Qualifier: Diagnosis of  By: Julaine Hua CMA (AAMA), [name]    . S/P lumbar spinal fusion 07/02/2012  . Tubulovillous adenoma of colon 2008   Past Surgical History:  Procedure Laterality Date  . ABDOMINAL HYSTERECTOMY    . COLONOSCOPY  2014  . laser surgery to remove scar     not sure of the year; after hyst  . RADIOLOGY WITH ANESTHESIA N/A 02/25/2015   Procedure: MRI OF LUMBAR SPINE;  Surgeon: Medication Radiologist, MD;  Location: [facility];  Service: Radiology;  Laterality: N/A;  DR. MAX COHEN/MRI  . SPINE SURGERY    . TONSILLECTOMY AND ADENOIDECTOMY  reports that she quit smoking about 22 years ago. Her smoking use included cigarettes. She has a 38.00 pack-year smoking history. She has never used smokeless tobacco. She reports that she does not drink alcohol or use drugs. family history includes Arthritis in her father; Breast cancer in her cousin and paternal aunt; Cancer in her maternal grandfather; Cervical cancer in her sister; Diabetes in her sister; Heart disease in her sister; Hyperlipidemia in her father, mother, and sister; Hypertension in  her father, mother, and paternal grandmother; Pancreatic cancer in her maternal aunt. Allergies  Allergen Reactions  . Crestor [Rosuvastatin]   . Erythromycin     rash  . Lansoprazole   . Penicillins     rash  . Potassium-Containing Compounds Other (See Comments)    Stomach upset, can take pills, but not the packets   . Robaxin [Methocarbamol]     Messed up stomach  . Sulfonamide Derivatives     rash  . Trental [Pentoxifylline Er]     Bad headache      Observations/Objective: Patient sounds cheerful and well on the phone. I do not appreciate any SOB. Speech and thought processing are grossly intact. Patient reported vitals:  Assessment and Plan:  Probable acute sinusitis.  She has been fully Covid vaccinated and states that these are symptoms consistent with previous sinusitis.  No recent sick contacts  -Doxycycline 100 mg twice daily for 10 days -Follow-up promptly for any fever or any persistent or worsening symptoms  Follow Up Instructions:  -Follow-up with Dr. [name] if not improved in 1 week   [id number] [id number] [id number] 11-20 [id number] 21-30 I did not refer this patient for an OV in the next 24 hours for this/these issue(s).  I discussed the assessment and treatment plan with the patient. The patient was provided an opportunity to ask questions and all were answered. The patient agreed with the plan and demonstrated an understanding of the instructions.   The patient was advised to call back or seek an in-person evaluation if the symptoms worsen or if the condition fails to improve as anticipated.  I provided 13 minutes of non-face-to-face time during this encounter.   Carolann Littler, MD

## 2020-03-15 ENCOUNTER — Telehealth: Payer: Self-pay | Admitting: *Deleted

## 2020-03-15 NOTE — Telephone Encounter (Signed)
Hopefully she was told to stop abx. Some of these are common side effects of Doxycycline and other antibiotics. Chest pain and palpitations are not common side effects of doxycycline.  If she is still having symptoms, please arrange follow-up appointment. Thanks, BJ

## 2020-03-15 NOTE — Telephone Encounter (Signed)
Patient called Nurse Triage on 03/12/2020. Patient reports she started Doxycycline abx for a sinus infection but it is causing bad headaches pain 8/10, heart palpitations (no chest pain), feels like passing out, diarrhea and nausea. Patient reports sinus pain has improved

## 2020-03-16 NOTE — Telephone Encounter (Signed)
Returned call to patient and she stated that she did stop taking medication due to side effects. Patient stated that she is feeling better, but needed to schedule a follow-up appointment with her PCP because she saw another provider while she was out. Patient scheduled for 03/19/2020.

## 2020-03-19 ENCOUNTER — Ambulatory Visit (INDEPENDENT_AMBULATORY_CARE_PROVIDER_SITE_OTHER): Payer: Medicare PPO | Admitting: Family Medicine

## 2020-03-19 ENCOUNTER — Encounter: Payer: Self-pay | Admitting: Family Medicine

## 2020-03-19 ENCOUNTER — Other Ambulatory Visit: Payer: Self-pay

## 2020-03-19 VITALS — BP 126/74 | HR 66 | Temp 97.1°F | Resp 16 | Ht 61.0 in | Wt 246.4 lb

## 2020-03-19 DIAGNOSIS — N898 Other specified noninflammatory disorders of vagina: Secondary | ICD-10-CM | POA: Diagnosis not present

## 2020-03-19 DIAGNOSIS — J309 Allergic rhinitis, unspecified: Secondary | ICD-10-CM | POA: Diagnosis not present

## 2020-03-19 DIAGNOSIS — R42 Dizziness and giddiness: Secondary | ICD-10-CM | POA: Diagnosis not present

## 2020-03-19 DIAGNOSIS — B373 Candidiasis of vulva and vagina: Secondary | ICD-10-CM | POA: Diagnosis not present

## 2020-03-19 DIAGNOSIS — B3731 Acute candidiasis of vulva and vagina: Secondary | ICD-10-CM

## 2020-03-19 MED ORDER — TERCONAZOLE 0.4 % VA CREA: 1.0000 | TOPICAL_CREAM | Freq: Every day | VAGINAL | 0 refills | Status: AC

## 2020-03-19 MED ORDER — FLUCONAZOLE 150 MG PO TABS: 150.0000 mg | ORAL_TABLET | ORAL | 0 refills | Status: AC

## 2020-03-19 MED ORDER — MONTELUKAST SODIUM 10 MG PO TABS: 10.0000 mg | ORAL_TABLET | Freq: Every day | ORAL | 1 refills | Status: DC

## 2020-03-19 NOTE — Progress Notes (Signed)
Chief Complaint  Patient presents with  . Medication Management    follow-up to discuss side effects from medication   HPI: [name] is a 69 y.o. female with hx of chronic pain,anxiety,and HTN here today c/o side effect when taking Doxycycline. She was seen on 03/10/20, when she was c/o sinus symptoms,Dx'ed with sinusitis and abx treatment started. She called on 03/15/20 c/o palpitations,chest discomfort,nausea,dizziness,and diarrhea. Symptoms resolved 3 days after stopping Doxycycline. Still feeling some lightheadedness in the morning when she gets up, improves as she walks for a few minutes.  She is still having some nasal congestion and episodic epistaxis. Allergic rhinitis:Currently she is on Flonase nasal spray, Astelin nasal spray, and cetirizine 10 mg daily. Negative for fever, chills, unusual fatigue, anosmia,ageusia, or sore throat.  Also c/o because urine was "light" while taking abx. Negative for fever,chills,dysuria,gross hematuria,or increased in urine frequency.  For the past 2 days she has noted vaginal pruritus and whitish discharge. She uses Monistat but did not help. Negative for vaginal bleeding and pelvic pain.  Review of Systems  Constitutional: Negative for activity change, appetite change and fatigue.  HENT: Positive for postnasal drip. Negative for mouth sores and sinus pain.   Eyes: Negative for redness and visual disturbance.  Respiratory: Negative for cough and wheezing.   Cardiovascular: Positive for leg swelling (no more than usual).  Gastrointestinal: Negative for abdominal pain, nausea and vomiting.  Skin: Negative for pallor and rash.  Neurological: Negative for syncope, weakness and headaches.  Psychiatric/Behavioral: The patient is nervous/anxious.   Rest see pertinent positives and negatives per HPI.  Current Outpatient Medications on File Prior to Visit  Medication Sig Dispense Refill  . amLODipine (NORVASC) 5 MG tablet Take  1 tablet (5 mg total) by mouth daily. 90 tablet 2  . Aspirin-Caffeine 845-65 MG PACK Take 1-2 packets by mouth daily as needed (headache).    Marland Kitchen atenolol (TENORMIN) 25 MG tablet Take 1 tablet (25 mg total) by mouth daily. 90 tablet 2  . azelastine (ASTELIN) 0.1 % nasal spray Place 2 sprays into both nostrils 2 (two) times daily. Use in each nostril as directed 30 mL 11  . calcium carbonate (TUMS EX) 750 MG chewable tablet Chew 2 tablets by mouth daily as needed for heartburn.    . cetirizine (ZYRTEC) 10 MG tablet Take 10 mg by mouth daily. Alternating with Allegra    . cyclobenzaprine (FLEXERIL) 10 MG tablet Take 2.5 mg by mouth daily as needed (pain level 8 or greater).     Marland Kitchen diclofenac sodium (VOLTAREN) 1 % GEL APPLY (4G) BY TOPICAL ROUTE 4 TIMES EVERY DAY TO THE AFFECTED AREA(S) (WC PA)  1  . dicyclomine (BENTYL) 10 MG capsule Take 1 capsule (10 mg total) by mouth 3 (three) times daily before meals. 90 capsule 1  . diphenhydrAMINE (BENADRYL) 25 MG tablet Take 25 mg by mouth daily as needed.    . fexofenadine (ALLEGRA) 60 MG tablet Take 60 mg by mouth daily.    . fluticasone (FLONASE) 50 MCG/ACT nasal spray Use 2 spray(s) in each nostril once daily 48 g 0  . guaiFENesin (MUCINEX) 600 MG 12 hr tablet Take 300 mg by mouth as needed.    . hydrochlorothiazide (HYDRODIURIL) 25 MG tablet Take 1 tablet (25 mg total) by mouth daily. 90 tablet 2  . HYDROcodone-acetaminophen (NORCO/VICODIN) 5-325 MG tablet Take 0.5 tablets by mouth daily as needed (pain level 8 or greater). 30 tablet 0  . hydrocortisone 2.5 %  ointment APPLY TWICE A DAY 60 g 11  . LORazepam (ATIVAN) 0.5 MG tablet Take 0.5-1 tablets (0.25-0.5 mg total) by mouth daily as needed for anxiety. 20 tablet 1  . Menthol-Camphor (ICY HOT ARTHRITIS PAIN RELIEF EX) Apply 1 application topically as needed.    . Multiple Vitamins-Minerals (MULTI-VITAMIN GUMMIES PO) Take by mouth as needed.    Marland Kitchen omeprazole (PRILOSEC) 40 MG capsule Take 1 capsule (40 mg  total) by mouth daily. (Patient taking differently: Take 80 mg by mouth daily. ) 60 capsule 11  . Polyethylene Glycol 3350 (MIRALAX PO) Take by mouth.    . potassium chloride (KLOR-CON) 10 MEQ tablet Take 2 tablets (20 mEq total) by mouth daily. 180 tablet 2  . pravastatin (PRAVACHOL) 20 MG tablet Take by mouth twice a week 27 tablet 3  . sertraline (ZOLOFT) 25 MG tablet Take 1 tablet (25 mg total) by mouth daily. 30 tablet 2  . triamcinolone cream (KENALOG) 0.1 % Apply 1 application topically 2 (two) times daily. 30 g 1  . trolamine salicylate (ASPERCREME) 10 % cream Apply 1 application topically as needed for muscle pain.    Marland Kitchen doxycycline (VIBRAMYCIN) 100 MG capsule Take 1 capsule (100 mg total) by mouth 2 (two) times daily. (Patient not taking: Reported on 03/19/2020) 20 capsule 0   No current facility-administered medications on file prior to visit.   Past Medical History:  Diagnosis Date  . Abdominal pain, epigastric 12/28/2008   Centricity Description: ABDOMINAL PAIN, EPIGASTRIC Qualifier: Diagnosis of  By: Julaine Hua CMA Megan Medina), [name]   Centricity Description: ABDOMINAL PAIN-EPIGASTRIC Qualifier: Diagnosis of  By: [name], [name]   . Allergic rhinitis 09/16/2013  . Allergy   . Anemia   . Anxiety   . Anxiety disorder, unspecified 07/06/2017  . Arthritis   . Blood in stool   . BMI 45.0-49.9, adult ([place]) 07/26/2016  . Class 3 obesity with serious comorbidity and body mass index (BMI) of 45.0 to 49.9 in adult 03/07/2017  . DDD (degenerative disc disease), lumbar   . DIVERTICULOSIS-COLON 12/28/2008   Qualifier: Diagnosis of  By: [name], [name]   . GERD 12/28/2008   Qualifier: Diagnosis of  By: [name], [name]   . GERD (gastroesophageal reflux disease)   . Headache   . Hyperglycemia 03/17/2014  . Hypertension   . Hypokalemia 07/26/2016  . IBS (irritable bowel syndrome)-C-D 07/26/2016  . Obesity   . Other and unspecified hyperlipidemia 03/17/2014  . PERSONAL HX COLONIC  POLYPS 12/28/2008   Qualifier: Diagnosis of  By: [name], [name]   . Reflux esophagitis 12/28/2008   Qualifier: Diagnosis of  By: Julaine Hua CMA (AAMA), [name]    . S/P lumbar spinal fusion 07/02/2012  . Tubulovillous adenoma of colon 2008   Allergies  Allergen Reactions  . Crestor [Rosuvastatin]   . Erythromycin     rash  . Lansoprazole   . Penicillins     rash  . Potassium-Containing Compounds Other (See Comments)    Stomach upset, can take pills, but not the packets   . Robaxin [Methocarbamol]     Messed up stomach  . Sulfonamide Derivatives     rash  . Trental [Pentoxifylline Er]     Bad headache    Social History   Socioeconomic History  . Marital status: Widowed    Spouse name: Not on file  . Number of children: Not on file  . Years of education: Not on file  . Highest education level: Not  on file  Occupational History  . Not on file  Tobacco Use  . Smoking status: Former Smoker    Packs/day: 1.00    Years: 38.00    Pack years: 38.00    Types: Cigarettes    Quit date: 10/28/1997    Years since quitting: 22.4  . Smokeless tobacco: Never Used  Vaping Use  . Vaping Use: Never used  Substance and Sexual Activity  . Alcohol use: No  . Drug use: No  . Sexual activity: Never  Other Topics Concern  . 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   Social Determinants of Health   Financial Resource Strain:   . Difficulty of Paying Living Expenses:   Food Insecurity:   . Worried About Charity fundraiser in the Last Year:   . Arboriculturist in the Last Year:   Transportation Needs:   . Film/video editor (Medical):   Marland Kitchen Lack of Transportation (Non-Medical):   Physical Activity:   . Days of Exercise per Week:   . Minutes of Exercise per Session:   Stress:   . Feeling of Stress :   Social Connections:   . Frequency of Communication with Friends and Family:   . Frequency of Social Gatherings with Friends and  Family:   . Attends Religious Services:   . Active Member of Clubs or Organizations:   . Attends Archivist Meetings:   Marland Kitchen Marital Status:     Vitals:   03/19/20 1105  BP: 126/74  Pulse: 66  Resp: 16  Temp: (!) 97.1 F (36.2 C)  SpO2: 97%   Body mass index is 46.55 kg/m.  Physical Exam  Nursing note and vitals reviewed. Constitutional: She is oriented to person, place, and time. She appears well-developed. No distress.  HENT:  Head: Normocephalic and atraumatic.  Nose: Right sinus exhibits no maxillary sinus tenderness and no frontal sinus tenderness. Left sinus exhibits no maxillary sinus tenderness and no frontal sinus tenderness. No epistaxis in the right nostril. No epistaxis in the left nostril.  Eyes: Pupils are equal, round, and reactive to light. Conjunctivae are normal.  Cardiovascular: Normal rate and regular rhythm.  No murmur heard. Respiratory: Effort normal and breath sounds normal. No respiratory distress.  GI: Soft. She exhibits no mass. There is no hepatomegaly. There is no abdominal tenderness.  Musculoskeletal:     Lumbar back: No tenderness or bony tenderness.     Right lower leg: Edema present.     Left lower leg: Edema present.     Comments: 1+ pitting LE edema,bilateral   Lymphadenopathy:    She has no cervical adenopathy.  Neurological: She is alert and oriented to person, place, and time. No cranial nerve deficit. Gait normal.  Skin: Skin is warm. No rash noted. No erythema.  Pitting 1+ LE edema.   Psychiatric: Her mood appears anxious.  Well groomed, good eye contact.    ASSESSMENT AND PLAN:  Ms.Jasime was seen today for medication management.  Diagnoses and all orders for this visit:  Vaginal itching We discussed possible causes. No risk factors for STD's.  Vulvovaginal candidiasis Most likely caused by recent abx intake. Recommend Diflucan x 2 + topical. F/U as needed.  -     fluconazole (DIFLUCAN) 150 MG tablet; Take  1 tablet (150 mg total) by mouth once a week for 2 doses. -     terconazole (TERAZOL 7) 0.4 % vaginal cream;  Place 1 applicator vaginally at bedtime for 7 days.  Lightheadedness Improved. Fall precautions discussed. Instructed about warning signs.  Allergic rhinitis, unspecified seasonality, unspecified trigger Flonase side effects discussed,can aggravate nose bleed. Continue Astelin nasal spray and Zyrtec 10 mg daily. Nasal irrigations with saline as needed. Singulair 10 mg added today.  -     montelukast (SINGULAIR) 10 MG tablet; Take 1 tablet (10 mg total) by mouth at bedtime.   Return in about 4 weeks (around 04/16/2020) for allergic rhinitis.   Miyu Fenderson G. [name], MD  [facility]. [place] office.  Discharge Instructions   None    No changes on your chronic medications. Adequate hydration. When lightheaded sit for a few seconds until gone, fall prevention.   If you need refills please call your pharmacy. Do not use My Chart to request refills or for acute issues that need immediate attention.    Please be sure medication list is accurate. If a new problem present, please set up appointment sooner than planned today

## 2020-03-19 NOTE — Patient Instructions (Signed)
A few things to remember from today's visit:   Vaginal itching - Plan: POCT urinalysis dipstick  Vulvovaginal candidiasis - Plan: fluconazole (DIFLUCAN) 150 MG tablet, terconazole (TERAZOL 7) 0.4 % vaginal cream  Lightheadedness  No changes on your chronic medications. Adequate hydration. When lightheaded sit for a few seconds until gone, fall prevention.   If you need refills please call your pharmacy. Do not use My Chart to request refills or for acute issues that need immediate attention.    Please be sure medication list is accurate. If a new problem present, please set up appointment sooner than planned today.

## 2020-03-21 ENCOUNTER — Encounter: Payer: Self-pay | Admitting: Family Medicine

## 2020-03-23 ENCOUNTER — Ambulatory Visit: Payer: Medicare PPO | Admitting: Family Medicine

## 2020-03-25 ENCOUNTER — Other Ambulatory Visit: Payer: Self-pay | Admitting: Family Medicine

## 2020-03-25 DIAGNOSIS — I10 Essential (primary) hypertension: Secondary | ICD-10-CM

## 2020-04-16 ENCOUNTER — Other Ambulatory Visit: Payer: Self-pay

## 2020-04-16 ENCOUNTER — Ambulatory Visit: Payer: Medicare PPO | Admitting: Family Medicine

## 2020-04-16 ENCOUNTER — Encounter: Payer: Self-pay | Admitting: Family Medicine

## 2020-04-16 VITALS — BP 128/80 | HR 100 | Resp 16 | Ht 61.0 in | Wt 248.0 lb

## 2020-04-16 DIAGNOSIS — K649 Unspecified hemorrhoids: Secondary | ICD-10-CM | POA: Diagnosis not present

## 2020-04-16 DIAGNOSIS — R600 Localized edema: Secondary | ICD-10-CM

## 2020-04-16 DIAGNOSIS — I10 Essential (primary) hypertension: Secondary | ICD-10-CM | POA: Diagnosis not present

## 2020-04-16 DIAGNOSIS — I4891 Unspecified atrial fibrillation: Secondary | ICD-10-CM | POA: Diagnosis not present

## 2020-04-16 DIAGNOSIS — K5900 Constipation, unspecified: Secondary | ICD-10-CM

## 2020-04-16 MED ORDER — LINACLOTIDE 145 MCG PO CAPS: 145.0000 ug | ORAL_CAPSULE | Freq: Every day | ORAL | 1 refills | Status: DC

## 2020-04-16 MED ORDER — APIXABAN 5 MG PO TABS: 5.0000 mg | ORAL_TABLET | Freq: Two times a day (BID) | ORAL | 1 refills | Status: DC

## 2020-04-16 NOTE — Patient Instructions (Addendum)
A few things to remember from today's visit:  Lower extremities swelling can be related to varicose veins. Elevation and compression stocking may help.  Low salt diet is very important.  I do not think back pain is caused by kidney problems, I think it is your chronic back pain getting worse. Wt loss will help.  Linzess started today for constipation. Stay home when you start medication,it can cause diarrhea.  EKG done today showed atrial fibrillation. Because your risk of stroke Eliquis started today. Cardiology appt is being arranged.  Atrial Fibrillation  Atrial fibrillation is a type of heartbeat that is irregular or fast. If you have this condition, your heart beats without any order. This makes it hard for your heart to pump blood in a normal way. Atrial fibrillation may come and go, or it may become a long-lasting problem. If this condition is not treated, it can put you at higher risk for stroke, heart failure, and other heart problems. What are the causes? This condition may be caused by diseases that damage the heart. They include:  High blood pressure.  Heart failure.  Heart valve disease.  Heart surgery. Other causes include:  Diabetes.  Thyroid disease.  Being overweight.  Kidney disease. Sometimes the cause is not known. What increases the risk? You are more likely to develop this condition if:  You are older.  You smoke.  You exercise often and very hard.  You have a family history of this condition.  You are a man.  You use drugs.  You drink a lot of alcohol.  You have lung conditions, such as emphysema, pneumonia, or COPD.  You have sleep apnea. What are the signs or symptoms? Common symptoms of this condition include:  A feeling that your heart is beating very fast.  Chest pain or discomfort.  Feeling short of breath.  Suddenly feeling light-headed or weak.  Getting tired easily during activity.  Fainting.  Sweating. In  some cases, there are no symptoms. How is this treated? Treatment for this condition depends on underlying conditions and how you feel when you have atrial fibrillation. They include:  Medicines to: ? Prevent blood clots. ? Treat heart rate or heart rhythm problems.  Using devices, such as a pacemaker, to correct heart rhythm problems.  Doing surgery to remove the part of the heart that sends bad signals.  Closing an area where clots can form in the heart (left atrial appendage). In some cases, your doctor will treat other underlying conditions. Follow these instructions at home: Medicines  Take over-the-counter and prescription medicines only as told by your doctor.  Do not take any new medicines without first talking to your doctor.  If you are taking blood thinners: ? Talk with your doctor before you take any medicines that have aspirin or NSAIDs, such as ibuprofen, in them. ? Take your medicine exactly as told by your doctor. Take it at the same time each day. ? Avoid activities that could hurt or bruise you. Follow instructions about how to prevent falls. ? Wear a bracelet that says you are taking blood thinners. Or, carry a card that lists what medicines you take. Lifestyle      Do not use any products that have nicotine or tobacco in them. These include cigarettes, e-cigarettes, and chewing tobacco. If you need help quitting, ask your doctor.  Eat heart-healthy foods. Talk with your doctor about the right eating plan for you.  Exercise regularly as told by your doctor.  Do not drink alcohol.  Lose weight if you are overweight.  Do not use drugs, including cannabis. General instructions  If you have a condition that causes breathing to stop for a short period of time (apnea), treat it as told by your doctor.  Keep a healthy weight. Do not use diet pills unless your doctor says they are safe for you. Diet pills may make heart problems worse.  Keep all follow-up  visits as told by your doctor. This is important. Contact a doctor if:  You notice a change in the speed, rhythm, or strength of your heartbeat.  You are taking a blood-thinning medicine and you get more bruising.  You get tired more easily when you move or exercise.  You have a sudden change in weight. Get help right away if:   You have pain in your chest or your belly (abdomen).  You have trouble breathing.  You have side effects of blood thinners, such as blood in your vomit, poop (stool), or pee (urine), or bleeding that cannot stop.  You have any signs of a stroke. "BE FAST" is an easy way to remember the main warning signs: ? B - Balance. Signs are dizziness, sudden trouble walking, or loss of balance. ? E - Eyes. Signs are trouble seeing or a change in how you see. ? F - Face. Signs are sudden weakness or loss of feeling in the face, or the face or eyelid drooping on one side. ? A - Arms. Signs are weakness or loss of feeling in an arm. This happens suddenly and usually on one side of the body. ? S - Speech. Signs are sudden trouble speaking, slurred speech, or trouble understanding what people say. ? T - Time. Time to call emergency services. Write down what time symptoms started.  You have other signs of a stroke, such as: ? A sudden, very bad headache with no known cause. ? Feeling like you may vomit (nausea). ? Vomiting. ? A seizure. These symptoms may be an emergency. Do not wait to see if the symptoms will go away. Get medical help right away. Call your local emergency services (911 in the [place] Do not drive yourself to the hospital. Summary  Atrial fibrillation is a type of heartbeat that is irregular or fast.  You are at higher risk of this condition if you smoke, are older, have diabetes, or are overweight.  Follow your doctor's instructions about medicines, diet, exercise, and follow-up visits.  Get help right away if you have signs or symptoms of a  stroke.  Get help right away if you cannot catch your breath, or you have chest pain or discomfort. This information is not intended to replace advice given to you by your health care provider. Make sure you discuss any questions you have with your health care provider. Document Revised: 03/19/2019 Document Reviewed: 03/19/2019 Elsevier Patient Education  [employer].   If you need refills please call your pharmacy. Do not use My Chart to request refills or for acute issues that need immediate attention.    Please be sure medication list is accurate. If a new problem present, please set up appointment sooner than planned today.

## 2020-04-16 NOTE — Progress Notes (Signed)
HPI: [name] is a 69 y.o. female, who is here today for follow up.   She was last seen on 03/19/20. She is concerned about her kidneys. Back pain when she is voiding and suprapubic sensation. Negative for dysuria,increased frequency,gross hematuria, or urgency.  She is adding more salt to her meals because it seems to help with leg cramps. Concerned about LE edema, mainly around ankles. Worse at the end of the day. Negative for decreased urine or foam in urine.  Negative for orthopnea and PND.  HTN: Today her HR is higher than her usual and irregular HR. Palpitations for 1-2 months. She has hx of irregular HR and bradycardia. She is on Atenolol 25 mg daily,Amlodipine 5 mg daily, and HCTZ 25 mg daily.  Negative for severe/frequent headache, visual changes, chest pain, dyspnea, claudication like symptoms, or focal weakness.  Lab Results  Component Value Date   CREATININE 0.89 01/27/2020   BUN 22 01/27/2020   NA 140 01/27/2020   K 4.3 01/27/2020   CL 101 01/27/2020   CO2 30 01/27/2020   More fatigue than usual. She got "some iron pills" and wants to know if she ca start taking supplementation.  Hx of rectal bleed, hemorrhoids. She is having "heavier" bleeding after defecation. Blood is on tissue and in toilet. Last colonoscopy on 10/16/18. Occasional dyschezia, OTC hemorrhoid cream helps. She takes BC's for chronic back pain and on chronic opioid use.  Abdominal pain,no more than usual. No associated nausea or vomiting.  +Hard stools, has to strain. Taking Miralax, which is not helping with constipation.  Lab Results  Component Value Date   WBC 5.1 05/16/2019   HGB 13.4 05/16/2019   HCT 40.2 05/16/2019   MCV 87.1 05/16/2019   PLT 227.0 05/16/2019    Review of Systems  Constitutional: Positive for fatigue. Negative for activity change, appetite change and fever.  HENT: Negative for mouth sores, nosebleeds, sore throat and trouble swallowing.    Eyes: Negative for redness and visual disturbance.  Respiratory: Negative for cough and wheezing.   Endocrine: Negative for cold intolerance and heat intolerance.  Genitourinary: Negative for vaginal bleeding and vaginal discharge.  Musculoskeletal: Positive for arthralgias and back pain.  Allergic/Immunologic: Positive for environmental allergies.  Neurological: Negative for syncope, facial asymmetry and weakness.  Psychiatric/Behavioral: Negative for confusion. The patient is nervous/anxious.   Rest of ROS, see pertinent positives sand negatives in HPI  Current Outpatient Medications on File Prior to Visit  Medication Sig Dispense Refill  . amLODipine (NORVASC) 5 MG tablet Take 1 tablet (5 mg total) by mouth daily. 90 tablet 2  . atenolol (TENORMIN) 25 MG tablet Take 1 tablet (25 mg total) by mouth daily. 90 tablet 2  . azelastine (ASTELIN) 0.1 % nasal spray Place 2 sprays into both nostrils 2 (two) times daily. Use in each nostril as directed 30 mL 11  . calcium carbonate (TUMS EX) 750 MG chewable tablet Chew 2 tablets by mouth daily as needed for heartburn.    . cetirizine (ZYRTEC) 10 MG tablet Take 10 mg by mouth daily. Alternating with Allegra    . cyclobenzaprine (FLEXERIL) 10 MG tablet Take 2.5 mg by mouth daily as needed (pain level 8 or greater).     Marland Kitchen diclofenac sodium (VOLTAREN) 1 % GEL APPLY (4G) BY TOPICAL ROUTE 4 TIMES EVERY DAY TO THE AFFECTED AREA(S) (WC PA)  1  . diphenhydrAMINE (BENADRYL) 25 MG tablet Take 25 mg by mouth daily as needed.    Marland Kitchen  fexofenadine (ALLEGRA) 60 MG tablet Take 60 mg by mouth daily.    . fluticasone (FLONASE) 50 MCG/ACT nasal spray Use 2 spray(s) in each nostril once daily 48 g 0  . guaiFENesin (MUCINEX) 600 MG 12 hr tablet Take 300 mg by mouth as needed.    . hydrochlorothiazide (HYDRODIURIL) 25 MG tablet TAKE 1 TABLET BY MOUTH ONCE DAILY (DUE FOR FOLLOW UP) 90 tablet 0  . HYDROcodone-acetaminophen (NORCO/VICODIN) 5-325 MG tablet Take 0.5 tablets by  mouth daily as needed (pain level 8 or greater). 30 tablet 0  . hydrocortisone 2.5 % ointment APPLY TWICE A DAY 60 g 11  . LORazepam (ATIVAN) 0.5 MG tablet Take 0.5-1 tablets (0.25-0.5 mg total) by mouth daily as needed for anxiety. 20 tablet 1  . Menthol-Camphor (ICY HOT ARTHRITIS PAIN RELIEF EX) Apply 1 application topically as needed.    . montelukast (SINGULAIR) 10 MG tablet Take 1 tablet (10 mg total) by mouth at bedtime. 30 tablet 1  . Multiple Vitamins-Minerals (MULTI-VITAMIN GUMMIES PO) Take by mouth as needed.    Marland Kitchen omeprazole (PRILOSEC) 40 MG capsule Take 1 capsule (40 mg total) by mouth daily. (Patient taking differently: Take 80 mg by mouth daily. ) 60 capsule 11  . Polyethylene Glycol 3350 (MIRALAX PO) Take by mouth.    . potassium chloride (KLOR-CON) 10 MEQ tablet Take 2 tablets (20 mEq total) by mouth daily. 180 tablet 2  . pravastatin (PRAVACHOL) 20 MG tablet Take by mouth twice a week 27 tablet 3  . sertraline (ZOLOFT) 25 MG tablet Take 1 tablet (25 mg total) by mouth daily. 30 tablet 2  . triamcinolone cream (KENALOG) 0.1 % Apply 1 application topically 2 (two) times daily. 30 g 1  . trolamine salicylate (ASPERCREME) 10 % cream Apply 1 application topically as needed for muscle pain.     No current facility-administered medications on file prior to visit.   Past Medical History:  Diagnosis Date  . Abdominal pain, epigastric 12/28/2008   Centricity Description: ABDOMINAL PAIN, EPIGASTRIC Qualifier: Diagnosis of  By: [name], [name]   Centricity Description: ABDOMINAL PAIN-EPIGASTRIC Qualifier: Diagnosis of  By: [name], [name]   . Allergic rhinitis 09/16/2013  . Allergy   . Anemia   . Anxiety   . Anxiety disorder, unspecified 07/06/2017  . Arthritis   . Blood in stool   . BMI 45.0-49.9, adult ([place]) 07/26/2016  . Class 3 obesity with serious comorbidity and body mass index (BMI) of 45.0 to 49.9 in adult 03/07/2017  . DDD (degenerative disc disease), lumbar    . DIVERTICULOSIS-COLON 12/28/2008   Qualifier: Diagnosis of  By: [name], [name]   . GERD 12/28/2008   Qualifier: Diagnosis of  By: [name], [name]   . GERD (gastroesophageal reflux disease)   . Headache   . Hyperglycemia 03/17/2014  . Hypertension   . Hypokalemia 07/26/2016  . IBS (irritable bowel syndrome)-C-D 07/26/2016  . Obesity   . Other and unspecified hyperlipidemia 03/17/2014  . PERSONAL HX COLONIC POLYPS 12/28/2008   Qualifier: Diagnosis of  By: [name], [name]   . Reflux esophagitis 12/28/2008   Qualifier: Diagnosis of  By: Julaine Hua CMA (AAMA), [name]    . S/P lumbar spinal fusion 07/02/2012  . Tubulovillous adenoma of colon 2008   Allergies  Allergen Reactions  . Crestor [Rosuvastatin]   . Erythromycin     rash  . Lansoprazole   . Penicillins     rash  . Potassium-Containing Compounds  Other (See Comments)    Stomach upset, can take pills, but not the packets   . Robaxin [Methocarbamol]     Messed up stomach  . Sulfonamide Derivatives     rash  . Trental [Pentoxifylline Er]     Bad headache    Social History   Socioeconomic History  . Marital status: Widowed    Spouse name: Not on file  . Number of children: Not on file  . Years of education: Not on file  . Highest education level: Not on file  Occupational History  . Not on file  Tobacco Use  . Smoking status: Former Smoker    Packs/day: 1.00    Years: 38.00    Pack years: 38.00    Types: Cigarettes    Quit date: 10/28/1997    Years since quitting: 22.4  . Smokeless tobacco: Never Used  Vaping Use  . Vaping Use: Never used  Substance and Sexual Activity  . Alcohol use: No  . Drug use: No  . Sexual activity: Never  Other Topics Concern  . 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   Social Determinants of Health   Financial Resource Strain:   . Difficulty of Paying Living Expenses:   Food Insecurity:   . Worried About Sales executive in the Last Year:   . Arboriculturist in the Last Year:   Transportation Needs:   . Film/video editor (Medical):   Marland Kitchen Lack of Transportation (Non-Medical):   Physical Activity:   . Days of Exercise per Week:   . Minutes of Exercise per Session:   Stress:   . Feeling of Stress :   Social Connections:   . Frequency of Communication with Friends and Family:   . Frequency of Social Gatherings with Friends and Family:   . Attends Religious Services:   . Active Member of Clubs or Organizations:   . Attends Archivist Meetings:   Marland Kitchen Marital Status:     Vitals:   04/16/20 0849  BP: 128/80  Pulse: 100  Resp: 16  SpO2: 96%   Body mass index is 46.86 kg/m.  Physical Exam Vitals and nursing note reviewed.  Constitutional:      General: She is not in acute distress.    Appearance: She is well-developed.  HENT:     Head: Normocephalic and atraumatic.     Mouth/Throat:     Mouth: Mucous membranes are moist.     Pharynx: Oropharynx is clear.  Eyes:     Conjunctiva/sclera: Conjunctivae normal.     Pupils: Pupils are equal, round, and reactive to light.  Cardiovascular:     Rate and Rhythm: Normal rate. Rhythm regularly irregular.     Pulses:          Dorsalis pedis pulses are 2+ on the right side and 2+ on the left side.     Heart sounds: No murmur heard.   Pulmonary:     Effort: Pulmonary effort is normal. No respiratory distress.     Breath sounds: Normal breath sounds.  Abdominal:     Palpations: Abdomen is soft. There is no hepatomegaly or mass.     Tenderness: There is no abdominal tenderness.  Musculoskeletal:     Right lower leg: 1+ Pitting Edema present.     Left lower leg: 1+ Pitting Edema present.  Lymphadenopathy:     Cervical: No cervical adenopathy.  Skin:  General: Skin is warm.     Findings: No erythema or rash.  Neurological:     General: No focal deficit present.     Mental Status: She is alert and oriented to person, place, and  time.     Cranial Nerves: No cranial nerve deficit.     Gait: Gait normal.  Psychiatric:        Mood and Affect: Affect normal. Mood is anxious.     Comments: Well groomed, good eye contact.    ASSESSMENT AND PLAN:  [name] was seen today for follow-up.  Orders Placed This Encounter  Procedures  . CBC  . Urinalysis, Routine w reflex microscopic  . TSH  . Brain Natriuretic Peptide  . Ambulatory referral to Cardiology  . EKG 12-Lead   Lab Results  Component Value Date   WBC 5.5 04/16/2020   HGB 14.1 04/16/2020   HCT 43.4 04/16/2020   MCV 86.5 04/16/2020   PLT 243 04/16/2020   Lab Results  Component Value Date   TSH 2.12 04/16/2020    Constipation, unspecified constipation type Opioid use is certainly a contributing factor. Adequate fluid and fiber intake. Linzess may help,side effects discussed.  -     linaclotide (LINZESS) 145 MCG CAPS capsule; Take 1 capsule (145 mcg total) by mouth daily before breakfast.  Essential hypertension BP adequately controlled. No changes in current management.  Bleeding hemorrhoids Chronic problems. Monitor for worsening problems after starting Eliquis. Stop BC's powder. Avoid constipation/straining.  Atrial fibrillation, unspecified type ([place]) New problems. We discussed dx,prognosis,and possible complications. CHADS2VASC score 4-6 (?hx of TIA). After discussion of side effects of chronic anticoagulations as well as benefits, she agrees with starting Eliquis. Continue Atenolol 25 mg daily. Appt with cardiologist will be arranged. Clearly instructed about warning signs.  -     apixaban (ELIQUIS) 5 MG TABS tablet; Take 1 tablet (5 mg total) by mouth 2 (two) times daily.  Bilateral lower extremity edema We discussed possible etiologies. Vein disease can be a contributing factors. Compression stocking and LE elevation will help as well as wt loss.   45 min face to face OV. > 50% was dedicated to discussion  of Dx, prognosis, treatment options, and some side effects of medications.    Return in about 4 weeks (around 05/14/2020) for constipation, HTN.    Geoff Dacanay G. [name], MD  [facility]. [place] office.

## 2020-04-19 LAB — BRAIN NATRIURETIC PEPTIDE

## 2020-04-19 LAB — CBC
HCT: 43.4 % (ref 35.0–45.0)
Hemoglobin: 14.1 g/dL (ref 11.7–15.5)
MCH: 28.1 pg (ref 27.0–33.0)
MCHC: 32.5 g/dL (ref 32.0–36.0)
MCV: 86.5 fL (ref 80.0–100.0)
MPV: 10.9 fL (ref 7.5–12.5)
Platelets: 243 10*3/uL (ref 140–400)
RBC: 5.02 10*6/uL (ref 3.80–5.10)
RDW: 14.8 % (ref 11.0–15.0)
WBC: 5.5 10*3/uL (ref 3.8–10.8)

## 2020-04-19 LAB — URINALYSIS, ROUTINE W REFLEX MICROSCOPIC
Bilirubin Urine: NEGATIVE
Glucose, UA: NEGATIVE
Hgb urine dipstick: NEGATIVE
Ketones, ur: NEGATIVE
Leukocytes,Ua: NEGATIVE
Nitrite: NEGATIVE
Protein, ur: NEGATIVE
Specific Gravity, Urine: 1.014 (ref 1.001–1.03)
pH: 7 (ref 5.0–8.0)

## 2020-04-19 LAB — TSH: TSH: 2.12 mIU/L (ref 0.40–4.50)

## 2020-04-20 ENCOUNTER — Other Ambulatory Visit: Payer: Self-pay

## 2020-04-20 DIAGNOSIS — R600 Localized edema: Secondary | ICD-10-CM

## 2020-04-20 DIAGNOSIS — I4891 Unspecified atrial fibrillation: Secondary | ICD-10-CM

## 2020-04-21 ENCOUNTER — Encounter: Payer: Self-pay | Admitting: Cardiovascular Disease

## 2020-04-21 ENCOUNTER — Other Ambulatory Visit: Payer: Self-pay

## 2020-04-21 ENCOUNTER — Ambulatory Visit: Payer: Medicare PPO | Admitting: Cardiovascular Disease

## 2020-04-21 VITALS — BP 114/76 | HR 62 | Ht 61.0 in | Wt 244.9 lb

## 2020-04-21 DIAGNOSIS — I4891 Unspecified atrial fibrillation: Secondary | ICD-10-CM | POA: Diagnosis not present

## 2020-04-21 DIAGNOSIS — I10 Essential (primary) hypertension: Secondary | ICD-10-CM

## 2020-04-21 DIAGNOSIS — R600 Localized edema: Secondary | ICD-10-CM | POA: Diagnosis not present

## 2020-04-21 MED ORDER — APIXABAN 5 MG PO TABS
5.0000 mg | ORAL_TABLET | Freq: Two times a day (BID) | ORAL | 2 refills | Status: DC
Start: 2020-04-21 — End: 2020-10-14

## 2020-04-21 NOTE — Patient Instructions (Addendum)
Medication Instructions:  Your physician has recommended you make the following change in your medication:  1.) change apixaban (Eliquis) 5 mg to ONE TABLET TWICE A DAY  *If you need a refill on your cardiac medications before your next appointment, please call your pharmacy*   Lab Work: none If you have labs (blood work) drawn today and your tests are completely normal, you will receive your results only by: Marland Kitchen MyChart Message (if you have MyChart) OR . A paper copy in the mail If you have any lab test that is abnormal or we need to change your treatment, we will call you to review the results.   Testing/Procedures: Your physician has requested that you have an echocardiogram. Echocardiography is a painless test that uses sound waves to create images of your heart. It provides your doctor with information about the size and shape of your heart and how well your heart's chambers and valves are working. This procedure takes approximately one hour. There are no restrictions for this procedure.  Your physician has recommended that you wear an event monitor. Event monitors are medical devices that record the heart's electrical activity. Doctors most often [place] these monitors to diagnose arrhythmias. Arrhythmias are problems with the speed or rhythm of the heartbeat. The monitor is a small, portable device. You can wear one while you do your normal daily activities. This is usually used to diagnose what is causing palpitations/syncope (passing out).  Follow-Up: At [facility], you and your health needs are our priority.  As part of our continuing mission to provide you with exceptional heart care, we have created designated Provider Care Teams.  These Care Teams include your primary Cardiologist (physician) and Advanced Practice Providers (APPs -  Physician Assistants and Nurse Practitioners) who all work together to provide you with the care you need, when you need it.  Your next appointment:   3  month(s)  The format for your next appointment:   In Person  Provider:   You may see one of the following Advanced Practice Providers on your designated Care Team:    Richardson Dopp, PA-C  Vin Stan[place], [place]    Other Instructions  Heart-Healthy Eating Plan Many factors influence your heart (coronary) health, including eating and exercise habits. Coronary risk increases with abnormal blood fat (lipid) levels. Heart-healthy meal planning includes limiting unhealthy fats, increasing healthy fats, and making other diet and lifestyle changes. What is my plan? Your health care provider may recommend that you:  Limit your fat intake to _________% or less of your total calories each day.  Limit your saturated fat intake to _________% or less of your total calories each day.  Limit the amount of cholesterol in your diet to less than _________ mg per day. What are tips for following this plan? Cooking Cook foods using methods other than frying. Baking, boiling, grilling, and broiling are all good options. Other ways to reduce fat include:  Removing the skin from poultry.  Removing all visible fats from meats.  Steaming vegetables in water or broth. Meal planning   At meals, imagine dividing your plate into fourths: ? Fill one-half of your plate with vegetables and green salads. ? Fill one-fourth of your plate with whole grains. ? Fill one-fourth of your plate with lean protein foods.  Eat 4-5 servings of vegetables per day. One serving equals 1 cup raw or cooked vegetable, or 2 cups raw leafy greens.  Eat 4-5 servings of fruit per day. One serving equals 1  medium whole fruit,  cup dried fruit,  cup fresh, frozen, or canned fruit, or  cup 100% fruit juice.  Eat more foods that contain soluble fiber. Examples include apples, broccoli, carrots, beans, peas, and barley. Aim to get 25-30 g of fiber per day.  Increase your consumption of legumes, nuts, and seeds to 4-5 servings per  week. One serving of dried beans or legumes equals  cup cooked, 1 serving of nuts is  cup, and 1 serving of seeds equals 1 tablespoon. Fats  Choose healthy fats more often. Choose monounsaturated and polyunsaturated fats, such as olive and canola oils, flaxseeds, walnuts, almonds, and seeds.  Eat more omega-3 fats. Choose salmon, mackerel, sardines, tuna, flaxseed oil, and ground flaxseeds. Aim to eat fish at least 2 times each week.  Check food labels carefully to identify foods with trans fats or high amounts of saturated fat.  Limit saturated fats. These are found in animal products, such as meats, butter, and cream. Plant sources of saturated fats include palm oil, palm kernel oil, and coconut oil.  Avoid foods with partially hydrogenated oils in them. These contain trans fats. Examples are stick margarine, some tub margarines, cookies, crackers, and other baked goods.  Avoid fried foods. General information  Eat more home-cooked food and less restaurant, buffet, and fast food.  Limit or avoid alcohol.  Limit foods that are high in starch and sugar.  Lose weight if you are overweight. Losing just 5-10% of your body weight can help your overall health and prevent diseases such as diabetes and heart disease.  Monitor your salt (sodium) intake, especially if you have high blood pressure. Talk with your health care provider about your sodium intake.  Try to incorporate more vegetarian meals weekly. What foods can I eat? Fruits All fresh, canned (in natural juice), or frozen fruits. Vegetables Fresh or frozen vegetables (raw, steamed, roasted, or grilled). Green salads. Grains Most grains. Choose whole wheat and whole grains most of the time. Rice and pasta, including brown rice and pastas made with whole wheat. Meats and other proteins Lean, well-trimmed beef, veal, pork, and lamb. Chicken and [place] without skin. All fish and shellfish. Wild duck, rabbit, pheasant, and venison.  Egg whites or low-cholesterol egg substitutes. Dried beans, peas, lentils, and tofu. Seeds and most nuts. Dairy Low-fat or nonfat cheeses, including ricotta and mozzarella. Skim or 1% milk (liquid, powdered, or evaporated). Buttermilk made with low-fat milk. Nonfat or low-fat yogurt. Fats and oils Non-hydrogenated (trans-free) margarines. Vegetable oils, including soybean, sesame, sunflower, olive, peanut, safflower, corn, canola, and cottonseed. Salad dressings or mayonnaise made with a vegetable oil. Beverages Water (mineral or sparkling). Coffee and tea. Diet carbonated beverages. Sweets and desserts Sherbet, gelatin, and fruit ice. Small amounts of dark chocolate. Limit all sweets and desserts. Seasonings and condiments All seasonings and condiments. The items listed above may not be a complete list of foods and beverages you can eat. Contact a dietitian for more options. What foods are not recommended? Fruits Canned fruit in heavy syrup. Fruit in cream or butter sauce. Fried fruit. Limit coconut. Vegetables Vegetables cooked in cheese, cream, or butter sauce. Fried vegetables. Grains Breads made with saturated or trans fats, oils, or whole milk. Croissants. Sweet rolls. Donuts. High-fat crackers, such as cheese crackers. Meats and other proteins Fatty meats, such as hot dogs, ribs, sausage, bacon, rib-eye roast or steak. High-fat deli meats, such as salami and bologna. Caviar. Domestic duck and goose. Organ meats, such as liver. Dairy Cream, sour  cream, cream cheese, and creamed cottage cheese. Whole milk cheeses. Whole or 2% milk (liquid, evaporated, or condensed). Whole buttermilk. Cream sauce or high-fat cheese sauce. Whole-milk yogurt. Fats and oils Meat fat, or shortening. Cocoa butter, hydrogenated oils, palm oil, coconut oil, palm kernel oil. Solid fats and shortenings, including bacon fat, salt pork, lard, and butter. Nondairy cream substitutes. Salad dressings with cheese or  sour cream. Beverages Regular sodas and any drinks with added sugar. Sweets and desserts Frosting. Pudding. Cookies. Cakes. Pies. Milk chocolate or white chocolate. Buttered syrups. Full-fat ice cream or ice cream drinks. The items listed above may not be a complete list of foods and beverages to avoid. Contact a dietitian for more information. Summary  Heart-healthy meal planning includes limiting unhealthy fats, increasing healthy fats, and making other diet and lifestyle changes.  Lose weight if you are overweight. Losing just 5-10% of your body weight can help your overall health and prevent diseases such as diabetes and heart disease.  Focus on eating a balance of foods, including fruits and vegetables, low-fat or nonfat dairy, lean protein, nuts and legumes, whole grains, and heart-healthy oils and fats. This information is not intended to replace advice given to you by your health care provider. Make sure you discuss any questions you have with your health care provider. Document Revised: 11/02/2017 Document Reviewed: 11/02/2017 Elsevier Patient Education  2020 [employer].

## 2020-04-21 NOTE — Progress Notes (Signed)
Cardiology Office Note:    Date:  04/21/2020   ID:  [name], DOB 1951-07-24, MRN [id number]  PCP:  [name], [name], MD  Cardiologist:  Megan Moores, MD    Referring MD: [name], [name], MD   Chief Complaint  Patient presents with  . Hypertension   Problem list 1.  Sinus bradycardia 2.  Essential hypertension  10/16/17    [name] is a 69 y.o. female with a hx of  HTN. She has been on Atenolol - dose has been gradually decreased due to sinus bradycardia .  She was started on Lotrel ( amlodipine - benazepril  10-20 mg a day )  The Lotrel has caused her legs to swell and has pain in her bladder when she urinates.   Has also developed chills since she has been taking the Lotrel .   Has never had any syncope.   Has occasional "swimmy headedness"  and orthostasis .   Family hx of MI and strokes in her family   She eats fast foods and prepared foods 3-4 times a week. Walks every other day  - 1/4 mile a day   She fell on Dec. 11 ( was pushing her mother up a wheelchair ramp and fell in the ice )   December 05, 2017: [name] was seen today for follow-up of her high blood pressure.  Her blood pressure has been much better only. Is eating better,  Is sweating lots.    Was told to drink Gatorade several times a week.  Wt is 244 .   Has lost 9 lbs   Has not working out regularly  Has had some ortho  issues  Is eating out much less.   April 21, 2020:  Seen back after ~ 2 year absence  Has no energy. Had palpitations last night - lasted a few minutes.  Was in the bed so no CP , no dyspnea Has lost 4 lbs this past week .  Labs from her primary MD were reveiwed and look ok  Takes a teaspoon of salt 1-2 times a week for leg cramps.   She is now on some leg cramp medicine .  Was advised not to eat salt.  She eats fast foods 3 times a week,  [employer], [employer], [employer], cook out  [employer] several times a week Eats bologna, sausages    Past  Medical History:  Diagnosis Date  . Abdominal pain, epigastric 12/28/2008   Centricity Description: ABDOMINAL PAIN, EPIGASTRIC Qualifier: Diagnosis of  By: [name], [name]   Centricity Description: ABDOMINAL PAIN-EPIGASTRIC Qualifier: Diagnosis of  By: [name]   . Allergic rhinitis 09/16/2013  . Allergy   . Anemia   . Anxiety   . Anxiety disorder, unspecified 07/06/2017  . Arthritis   . Blood in stool   . BMI 45.0-49.9, adult ([place]) 07/26/2016  . Class 3 obesity with serious comorbidity and body mass index (BMI) of 45.0 to 49.9 in adult 03/07/2017  . DDD (degenerative disc disease), lumbar   . DIVERTICULOSIS-COLON 12/28/2008   Qualifier: Diagnosis of  By: [name]   . GERD 12/28/2008   Qualifier: Diagnosis of  By: [name]   . GERD (gastroesophageal reflux disease)   . Headache   . Hyperglycemia 03/17/2014  . Hypertension   . Hypokalemia 07/26/2016  . IBS (irritable bowel syndrome)-C-D 07/26/2016  . Obesity   . Other and unspecified hyperlipidemia 03/17/2014  .  PERSONAL HX COLONIC POLYPS 12/28/2008   Qualifier: Diagnosis of  By: [name]   . Reflux esophagitis 12/28/2008   Qualifier: Diagnosis of  By: [name]    . Herring/P lumbar spinal fusion 07/02/2012  . Tubulovillous adenoma of colon 2008    Past Surgical History:  Procedure Laterality Date  . ABDOMINAL HYSTERECTOMY    . COLONOSCOPY  2014  . laser surgery to remove scar     not sure of the year; after hyst  . RADIOLOGY WITH ANESTHESIA N/A 02/25/2015   Procedure: MRI OF LUMBAR SPINE;  Surgeon: Medication Radiologist, MD;  Location: [facility];  Service: Radiology;  Laterality: N/A;  DR. MAX COHEN/MRI  . SPINE SURGERY    . TONSILLECTOMY AND ADENOIDECTOMY      Current Medications: Current Meds  Medication Sig  . amLODipine (NORVASC) 5 MG tablet Take 1 tablet (5 mg total) by mouth daily.  Marland Kitchen atenolol (TENORMIN) 25 MG tablet Take 1 tablet (25 mg total) by  mouth daily.  Marland Kitchen azelastine (ASTELIN) 0.1 % nasal spray Place 2 sprays into both nostrils 2 (two) times daily. Use in each nostril as directed  . calcium carbonate (TUMS EX) 750 MG chewable tablet Chew 2 tablets by mouth daily as needed for heartburn.  . cetirizine (ZYRTEC) 10 MG tablet Take 10 mg by mouth daily. Alternating with Allegra  . cyclobenzaprine (FLEXERIL) 10 MG tablet Take 2.5 mg by mouth daily as needed (pain level 8 or greater).   Marland Kitchen diclofenac sodium (VOLTAREN) 1 % GEL APPLY (4G) BY TOPICAL ROUTE 4 TIMES EVERY DAY TO THE AFFECTED AREA([name] (WC PA)  . diphenhydrAMINE (BENADRYL) 25 MG tablet Take 25 mg by mouth daily as needed.  . fexofenadine (ALLEGRA) 60 MG tablet Take 60 mg by mouth daily.  . fluticasone (FLONASE) 50 MCG/ACT nasal spray Use 2 spray([name] in each nostril once daily  . guaiFENesin (MUCINEX) 600 MG 12 hr tablet Take 300 mg by mouth as needed.  . hydrochlorothiazide (HYDRODIURIL) 25 MG tablet TAKE 1 TABLET BY MOUTH ONCE DAILY (DUE FOR FOLLOW UP)  . HYDROcodone-acetaminophen (NORCO/VICODIN) 5-325 MG tablet Take 0.5 tablets by mouth daily as needed (pain level 8 or greater).  . hydrocortisone 2.5 % ointment APPLY TWICE A DAY  . LORazepam (ATIVAN) 0.5 MG tablet Take 0.5-1 tablets (0.25-0.5 mg total) by mouth daily as needed for anxiety.  . Menthol-Camphor (ICY HOT ARTHRITIS PAIN RELIEF EX) Apply 1 application topically as needed.  . montelukast (SINGULAIR) 10 MG tablet Take 1 tablet (10 mg total) by mouth at bedtime.  . Multiple Vitamins-Minerals (MULTI-VITAMIN GUMMIES PO) Take by mouth as needed.  Marland Kitchen omeprazole (PRILOSEC) 40 MG capsule Take 1 capsule (40 mg total) by mouth daily.  . Polyethylene Glycol 3350 (MIRALAX PO) Take by mouth.  . potassium chloride (KLOR-CON) 10 MEQ tablet Take 2 tablets (20 mEq total) by mouth daily.  . pravastatin (PRAVACHOL) 20 MG tablet Take by mouth twice a week  . trolamine salicylate (ASPERCREME) 10 % cream Apply 1 application topically as  needed for muscle pain.  . [DISCONTINUED] apixaban (ELIQUIS) 5 MG TABS tablet Take 5 mg by mouth daily in the afternoon.     Allergies:   Crestor [rosuvastatin], Erythromycin, Lansoprazole, Penicillins, Potassium-containing compounds, Robaxin [methocarbamol], Sulfonamide derivatives, and Trental [pentoxifylline er]   Social History   Socioeconomic History  . Marital status: Widowed    Spouse name: Not on file  . Number of children: Not on file  . Years of  education: Not on file  . Highest education level: Not on file  Occupational History  . Not on file  Tobacco Use  . Smoking status: Former Smoker    Packs/day: 1.00    Years: 38.00    Pack years: 38.00    Types: Cigarettes    Quit date: 10/28/1997    Years since quitting: 22.4  . Smokeless tobacco: Never Used  Vaping Use  . Vaping Use: Never used  Substance and Sexual Activity  . Alcohol use: No  . Drug use: No  . Sexual activity: Never  Other Topics Concern  . 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   Social Determinants of Health   Financial Resource Strain:   . Difficulty of Paying Living Expenses:   Food Insecurity:   . Worried About Charity fundraiser in the Last Year:   . Arboriculturist in the Last Year:   Transportation Needs:   . Film/video editor (Medical):   Marland Kitchen Lack of Transportation (Non-Medical):   Physical Activity:   . Days of Exercise per Week:   . Minutes of Exercise per Session:   Stress:   . Feeling of Stress :   Social Connections:   . Frequency of Communication with Friends and Family:   . Frequency of Social Gatherings with Friends and Family:   . Attends Religious Services:   . Active Member of Clubs or Organizations:   . Attends Archivist Meetings:   Marland Kitchen Marital Status:      Family History: The patient'Herring family history includes Arthritis in her father; Breast cancer in her cousin and paternal aunt; Cancer in her maternal  grandfather; Cervical cancer in her sister; Diabetes in her sister; Heart disease in her sister; Hyperlipidemia in her father, mother, and sister; Hypertension in her father, mother, and paternal grandmother; Pancreatic cancer in her maternal aunt.  ROS:   As per current hx. Otherwise negatve    EKGs/Labs/Other Studies Reviewed:    The following studies were reviewed today:   EKG   Recent Labs: 01/27/2020: ALT 16; BUN 22; Creatinine, Ser 0.89; Potassium 4.3; Sodium 140 04/16/2020: Brain Natriuretic Peptide CANCELED; Hemoglobin 14.1; Platelets 243; TSH 2.12  Recent Lipid Panel    Component Value Date/Time   CHOL 167 01/27/2020 1007   TRIG 105.0 01/27/2020 1007   HDL 55.60 01/27/2020 1007   CHOLHDL 3 01/27/2020 1007   VLDL 21.0 01/27/2020 1007   LDLCALC 91 01/27/2020 1007   Physical Exam: Blood pressure 114/76, pulse 62, height 5\' 1"  (1.549 m), weight 244 lb 13.9 oz (111.1 kg), SpO2 97 %.  GEN:  Well nourished, well developed in no acute distress HEENT: Normal NECK: No JVD; No carotid bruits LYMPHATICS: No lymphadenopathy CARDIAC: RRR , no murmurs, rubs, gallops RESPIRATORY:  Clear to auscultation without rales, wheezing or rhonchi  ABDOMEN: Soft, non-tender, non-distended MUSCULOSKELETAL:  No edema; No deformity  SKIN: Warm and dry NEUROLOGIC:  Alert and oriented x 3   ASSESSMENT:    1. Atrial fibrillation, unspecified type ([place])   2. Bilateral lower extremity edema    PLAN:    1.  Atrial fib:   She was diagnosed as having atrial fibrillation by her primary medical doctor.  By exam she is in normal sinus rhythm today.  I do not have access to the EKG from several days ago.  For now she will continue with the Eliquis.  We will place a  30 day monitor to evaluate her atrial fib burden .  2.  Essential hypertension:    Blood pressure at present seems to be fairly well controlled.  She still eats a lot of salty foods.  We had a long conversation about reducing the salt in her  diet.  3.  Obesity:   She eats a very poor diet - lots of fast foods and processed foods.  Discussed dietary changes  4.  Fatigue:  Will get an echo.  Her poor diet is also contributing .   To see an APP in 3 months     Medication Adjustments/Labs and Tests Ordered: Current medicines are reviewed at length with the patient today.  Concerns regarding medicines are outlined above.  Orders Placed This Encounter  Procedures  . Pro b natriuretic peptide (BNP)  . CARDIAC EVENT MONITOR  . ECHOCARDIOGRAM COMPLETE   Meds ordered this encounter  Medications  . apixaban (ELIQUIS) 5 MG TABS tablet    Sig: Take 1 tablet (5 mg total) by mouth 2 (two) times daily.    Dispense:  60 tablet    Refill:  2    Signed, Megan Moores, MD  04/21/2020 5:49 PM    [place]

## 2020-04-22 LAB — PRO B NATRIURETIC PEPTIDE: NT-Pro BNP: 134 pg/mL (ref 0–301)

## 2020-04-26 ENCOUNTER — Telehealth: Payer: Self-pay | Admitting: Family Medicine

## 2020-04-26 NOTE — Telephone Encounter (Signed)
Pt stated that she drinks a lot of water and cranberry juice but also sweats a lot and wonders if that could cause her urine to be dark?    Pt can be reached at [phone]

## 2020-04-27 NOTE — Telephone Encounter (Signed)
I spoke with patient. Pt was concerned about dehydration, advised to continue drinking plenty of fluids. Pt verbalized understanding.

## 2020-04-30 ENCOUNTER — Telehealth: Payer: Self-pay | Admitting: Radiology

## 2020-04-30 NOTE — Telephone Encounter (Signed)
Enrolled patient for a 30 [employer] Event monitor to be mailed to patients home.  

## 2020-05-05 ENCOUNTER — Ambulatory Visit (INDEPENDENT_AMBULATORY_CARE_PROVIDER_SITE_OTHER): Payer: Medicare PPO

## 2020-05-05 ENCOUNTER — Telehealth: Payer: Self-pay | Admitting: Internal Medicine

## 2020-05-05 DIAGNOSIS — I4891 Unspecified atrial fibrillation: Secondary | ICD-10-CM

## 2020-05-05 NOTE — Telephone Encounter (Signed)
Called by [employer]:  A flutter rate of 120 Symptomatic with fatigue and palpitations.  Unable to bring up primary strips.  Called patient:  Palpitations had now stopped.  Patient feels fine.  No fatigue or shortness of breath.  This is indicative of how she feels when she is in atrial fibrillation.  Of note, patient sometimes has low blood pressure on current regimen.  Patient is presently taking only one dose of her BID eliquis due to hemmrhoid bleeding.  Discussed recommendations to take full eliquis dosing and that if the feels terrible with return of AFl that there are things we can do to keep her in rhythm.  Discussed indications for urgent care/ED eval.  Patient had no further questions.  Rudean Haskell MD

## 2020-05-06 NOTE — Telephone Encounter (Signed)
She is already on eliquis. Will continue to follow

## 2020-05-06 NOTE — Telephone Encounter (Signed)
Strips received and reviewed by DOD, Dr. Marlou Porch.  Continue monitor.  Will place strips in bin to be scanned in.

## 2020-05-11 ENCOUNTER — Encounter (HOSPITAL_COMMUNITY): Payer: Self-pay | Admitting: Emergency Medicine

## 2020-05-11 ENCOUNTER — Telehealth: Payer: Self-pay | Admitting: Cardiovascular Disease

## 2020-05-11 ENCOUNTER — Other Ambulatory Visit: Payer: Self-pay

## 2020-05-11 ENCOUNTER — Ambulatory Visit (HOSPITAL_BASED_OUTPATIENT_CLINIC_OR_DEPARTMENT_OTHER): Payer: Medicare PPO

## 2020-05-11 ENCOUNTER — Emergency Department (HOSPITAL_COMMUNITY)
Admission: EM | Admit: 2020-05-11 | Discharge: 2020-05-11 | Disposition: A | Discharge status: Home / Self Care | Payer: Medicare PPO | Attending: Emergency Medicine | Admitting: Emergency Medicine

## 2020-05-11 DIAGNOSIS — M7989 Other specified soft tissue disorders: Secondary | ICD-10-CM | POA: Diagnosis not present

## 2020-05-11 DIAGNOSIS — R600 Localized edema: Secondary | ICD-10-CM | POA: Diagnosis not present

## 2020-05-11 DIAGNOSIS — Z5321 Procedure and treatment not carried out due to patient leaving prior to being seen by health care provider: Secondary | ICD-10-CM | POA: Diagnosis not present

## 2020-05-11 DIAGNOSIS — I4891 Unspecified atrial fibrillation: Secondary | ICD-10-CM | POA: Diagnosis not present

## 2020-05-11 DIAGNOSIS — Z7901 Long term (current) use of anticoagulants: Secondary | ICD-10-CM | POA: Insufficient documentation

## 2020-05-11 DIAGNOSIS — M79604 Pain in right leg: Secondary | ICD-10-CM

## 2020-05-11 LAB — ECHOCARDIOGRAM COMPLETE
Area-P 1/2: 4.6 cm2
S' Lateral: 2.8 cm

## 2020-05-11 MED ORDER — PERFLUTREN LIPID MICROSPHERE
1.0000 mL | INTRAVENOUS | Status: AC | PRN
  Administered 2020-05-11: 2 mL via INTRAVENOUS

## 2020-05-11 NOTE — Telephone Encounter (Signed)
Left message for patient to call back  

## 2020-05-11 NOTE — Telephone Encounter (Signed)
I agree with having her go and have this evaluated .

## 2020-05-11 NOTE — Telephone Encounter (Signed)
Called pt to go over her Echo results and she reported to me that she has had right inner thigh swelling for 2 days and it has been very painful. She is not sure if it is warm to touch and says she cannot tell if it is discolored.   I have suggested that she goes to the [facility] this evening to have it looked at ASAP... she is on E;iquis but I advised her that it should be assessed.. 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   I will forward to Dr. Acie Fredrickson for his review.

## 2020-05-11 NOTE — Telephone Encounter (Signed)
Patient asked that I inform Dr. Acie Fredrickson that her right leg has been swollen on the inter thigh for two days.  She would like for someone to contact her so she can further discuss the matter with him or her.

## 2020-05-11 NOTE — ED Notes (Signed)
Patient chose to leave. States she will follow up with PCP in AM.

## 2020-05-11 NOTE — ED Triage Notes (Signed)
Patient sent by PCP for further eval of swelling to RLE for r/o DVT. Denies CP/SOB. Patient currently takes Eliquis for history of afib.

## 2020-05-12 NOTE — Telephone Encounter (Signed)
Follow up  Pt said she went to urgent care yesterday per nurse advised, however, urgent care was closed. She went to [facility] ED waited for 4 hours then they told her that the test she needs closed at 5 pm. 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 She said she still have the same symptoms today and would like to know if Dr. Acie Fredrickson can order the tests she needs and do it to his office instead.  Please advise

## 2020-05-12 NOTE — Addendum Note (Signed)
Addended by: Rodman Key on: 05/12/2020 05:16 PM   Modules accepted: Orders

## 2020-05-12 NOTE — Telephone Encounter (Signed)
Patient is aware that we are working on appointment for [place] of her RLE.  Per [facility].  There are no opening at [facility] but may be at hospital.  Scheduling is closed.  [facility] will call them tomorrow am and then will contact patient.  She is appreciative for information provided.

## 2020-05-12 NOTE — Telephone Encounter (Signed)
Please order a venous duplex of her Right leg.

## 2020-05-13 ENCOUNTER — Ambulatory Visit (HOSPITAL_COMMUNITY)
Admission: RE | Admit: 2020-05-13 | Discharge: 2020-05-13 | Disposition: A | Discharge status: Home / Self Care | Payer: Medicare PPO | Source: Ambulatory Visit | Attending: Cardiovascular Disease | Admitting: Cardiovascular Disease

## 2020-05-13 ENCOUNTER — Telehealth: Payer: Self-pay

## 2020-05-13 ENCOUNTER — Other Ambulatory Visit: Payer: Self-pay

## 2020-05-13 DIAGNOSIS — M79604 Pain in right leg: Secondary | ICD-10-CM | POA: Insufficient documentation

## 2020-05-13 DIAGNOSIS — M7989 Other specified soft tissue disorders: Secondary | ICD-10-CM | POA: Insufficient documentation

## 2020-05-13 NOTE — Telephone Encounter (Signed)
Ultrasound department from hospital called stat results on patient's BLE venous duplex. Patient was negative in BLE for DVT. Will report to DOD, Dr. Burt Knack and send copy to Dr. Acie Fredrickson, ordering doctor.

## 2020-05-13 NOTE — Progress Notes (Signed)
Bilateral lower extremity venous duplex has been completed. Preliminary results can be found in CV Proc through chart review.  Results were given to [facility] with triage.  05/13/20 1:30 PM Carlos Levering RVT

## 2020-05-14 ENCOUNTER — Encounter: Payer: Self-pay | Admitting: Family Medicine

## 2020-05-14 ENCOUNTER — Ambulatory Visit: Payer: Medicare PPO | Admitting: Family Medicine

## 2020-05-14 VITALS — BP 128/80 | HR 70 | Temp 97.9°F | Resp 16 | Ht 61.0 in | Wt 245.1 lb

## 2020-05-14 DIAGNOSIS — R1031 Right lower quadrant pain: Secondary | ICD-10-CM | POA: Diagnosis not present

## 2020-05-14 DIAGNOSIS — Z6841 Body Mass Index (BMI) 40.0 and over, adult: Secondary | ICD-10-CM

## 2020-05-14 DIAGNOSIS — K5900 Constipation, unspecified: Secondary | ICD-10-CM | POA: Diagnosis not present

## 2020-05-14 DIAGNOSIS — M545 Low back pain, unspecified: Secondary | ICD-10-CM

## 2020-05-14 DIAGNOSIS — G8929 Other chronic pain: Secondary | ICD-10-CM

## 2020-05-14 DIAGNOSIS — R600 Localized edema: Secondary | ICD-10-CM

## 2020-05-14 DIAGNOSIS — M79605 Pain in left leg: Secondary | ICD-10-CM

## 2020-05-14 DIAGNOSIS — M79604 Pain in right leg: Secondary | ICD-10-CM

## 2020-05-14 MED ORDER — LINACLOTIDE 72 MCG PO CAPS: 72.0000 ug | ORAL_CAPSULE | Freq: Every day | ORAL | 1 refills | Status: DC

## 2020-05-14 NOTE — Patient Instructions (Addendum)
A few things to remember from today's visit:   Constipation, unspecified constipation type - Plan: linaclotide (LINZESS) 72 MCG capsule  Right leg pain  ? Coming from your back,pinched nerve. ? Hip arthritis, ? Hernia, vein disease.  Vein doctor already said it was veins. Elevate legs above heart level a few times per week.  ? Inguinal hernia, surgeon appt will be arranged.  Today Linzess dose decreased.  Continue working on wt loss. Max calories per day 1800.  If you need refills please call your pharmacy. Do not use My Chart to request refills or for acute issues that need immediate attention.   Groin pain could be a hernia.  Please be sure medication list is accurate. If a new problem present, please set up appointment sooner than planned today.

## 2020-05-14 NOTE — Progress Notes (Signed)
HPI: [name] is a 69 y.o. female, who is here today for follow up.   She was last seen on 04/16/20. Since her last visit she has seen cardiologist.  She is concerned about LE edema and pain, L>R. Edema improves with elevation and it is worse at the end of the day. According to pt,she had a LE [place] to evaluate for DVT, negative (05/13/20),and was recommended to follow with PCP.  Negative for associated CP,dyspnea,orthopnea and PND.  This is a chronic problem. She was evaluated by vascular on 06/19/18, Dr Oneida Alar.  Venous duplex exam showed no evidence of DVT.  She did have mild reflux in the proximal thigh greater saphenous and at the saphenofemoral junction on the left side.   She is not wearing compression stocking because had problem with fitting. It was determine that vein disease was not causing LE pain and she was not a good candidate for laser ablation.  Pain is getting worse overtime. She has not noted skin lesions or erythema. Pain is mainly in thighs and sometimes radiated to ankles.  Hx of lower back pain with radiculopathy. She does not think LE pain is related with her back pain. On chronic opioid therapy for pain. Negative for saddle anesthesia and bowel/bladder dysfunction.  She is also c/o right groin pain. She is not sure of this is related to LE pain. She has been evaluated by GI for RLQ abdominal pain.  She has had pain for a few years. No worsening abdominal pain,N/V,or urinary symptoms. Hx of IBS-C. Linzess helps but she has to take it daily and it causes diarrhea, 3 stools in the morning, also irritating perianal area. Hemorrhoids still bleed occasionally with defecation and "a little" worse with Eliquis but "not bad."  She doe snot exercise regularly because of pain. She is trying to eat healthier, increased fruit intake: 5 pitches in a day and cherries bag in 2-3 days.  Review of Systems  Constitutional: Positive for fatigue. Negative  for activity change, appetite change and fever.  HENT: Negative for mouth sores and nosebleeds.   Eyes: Negative for redness and visual disturbance.  Respiratory: Negative for cough and wheezing.   Genitourinary: Negative for decreased urine volume, dysuria and hematuria.  Musculoskeletal: Positive for arthralgias and back pain.  Allergic/Immunologic: Positive for environmental allergies.  Neurological: Negative for syncope, weakness and headaches.  Psychiatric/Behavioral: Negative for confusion. The patient is nervous/anxious.   Rest of ROS, see pertinent positives sand negatives in HPI  Current Outpatient Medications on File Prior to Visit  Medication Sig Dispense Refill  . amLODipine (NORVASC) 5 MG tablet Take 1 tablet (5 mg total) by mouth daily. 90 tablet 2  . apixaban (ELIQUIS) 5 MG TABS tablet Take 1 tablet (5 mg total) by mouth 2 (two) times daily. 60 tablet 2  . atenolol (TENORMIN) 25 MG tablet Take 1 tablet (25 mg total) by mouth daily. 90 tablet 2  . azelastine (ASTELIN) 0.1 % nasal spray Place 2 sprays into both nostrils 2 (two) times daily. Use in each nostril as directed 30 mL 11  . calcium carbonate (TUMS EX) 750 MG chewable tablet Chew 2 tablets by mouth daily as needed for heartburn.    . cetirizine (ZYRTEC) 10 MG tablet Take 10 mg by mouth daily. Alternating with Allegra    . cyclobenzaprine (FLEXERIL) 10 MG tablet Take 2.5 mg by mouth daily as needed (pain level 8 or greater).     Marland Kitchen diclofenac sodium (VOLTAREN) 1 %  GEL APPLY (4G) BY TOPICAL ROUTE 4 TIMES EVERY DAY TO THE AFFECTED AREA(S) (WC PA)  1  . diphenhydrAMINE (BENADRYL) 25 MG tablet Take 25 mg by mouth daily as needed.    . fexofenadine (ALLEGRA) 60 MG tablet Take 60 mg by mouth daily.    . fluticasone (FLONASE) 50 MCG/ACT nasal spray Use 2 spray(s) in each nostril once daily 48 g 0  . guaiFENesin (MUCINEX) 600 MG 12 hr tablet Take 300 mg by mouth as needed.    . hydrochlorothiazide (HYDRODIURIL) 25 MG tablet TAKE  1 TABLET BY MOUTH ONCE DAILY (DUE FOR FOLLOW UP) 90 tablet 0  . HYDROcodone-acetaminophen (NORCO/VICODIN) 5-325 MG tablet Take 0.5 tablets by mouth daily as needed (pain level 8 or greater). 30 tablet 0  . hydrocortisone 2.5 % ointment APPLY TWICE A DAY 60 g 11  . LORazepam (ATIVAN) 0.5 MG tablet Take 0.5-1 tablets (0.25-0.5 mg total) by mouth daily as needed for anxiety. 20 tablet 1  . Menthol-Camphor (ICY HOT ARTHRITIS PAIN RELIEF EX) Apply 1 application topically as needed.    . montelukast (SINGULAIR) 10 MG tablet Take 1 tablet (10 mg total) by mouth at bedtime. 30 tablet 1  . Multiple Vitamins-Minerals (MULTI-VITAMIN GUMMIES PO) Take by mouth as needed.    Marland Kitchen omeprazole (PRILOSEC) 40 MG capsule Take 1 capsule (40 mg total) by mouth daily. 60 capsule 11  . Polyethylene Glycol 3350 (MIRALAX PO) Take by mouth.    . potassium chloride (KLOR-CON) 10 MEQ tablet Take 2 tablets (20 mEq total) by mouth daily. 180 tablet 2  . pravastatin (PRAVACHOL) 20 MG tablet Take by mouth twice a week 27 tablet 3  . trolamine salicylate (ASPERCREME) 10 % cream Apply 1 application topically as needed for muscle pain.     No current facility-administered medications on file prior to visit.     Past Medical History:  Diagnosis Date  . Abdominal pain, epigastric 12/28/2008   Centricity Description: ABDOMINAL PAIN, EPIGASTRIC Qualifier: Diagnosis of  By: [name], [name]   Centricity Description: ABDOMINAL PAIN-EPIGASTRIC Qualifier: Diagnosis of  By: [name], [name]   . Allergic rhinitis 09/16/2013  . Allergy   . Anemia   . Anxiety   . Anxiety disorder, unspecified 07/06/2017  . Arthritis   . Blood in stool   . BMI 45.0-49.9, adult ([place]) 07/26/2016  . Class 3 obesity with serious comorbidity and body mass index (BMI) of 45.0 to 49.9 in adult 03/07/2017  . DDD (degenerative disc disease), lumbar   . DIVERTICULOSIS-COLON 12/28/2008   Qualifier: Diagnosis of  By: [name], [name]   . GERD  12/28/2008   Qualifier: Diagnosis of  By: [name], [name]   . GERD (gastroesophageal reflux disease)   . Headache   . Hyperglycemia 03/17/2014  . Hypertension   . Hypokalemia 07/26/2016  . IBS (irritable bowel syndrome)-C-D 07/26/2016  . Obesity   . Other and unspecified hyperlipidemia 03/17/2014  . PERSONAL HX COLONIC POLYPS 12/28/2008   Qualifier: Diagnosis of  By: [name], [name]   . Reflux esophagitis 12/28/2008   Qualifier: Diagnosis of  By: Julaine Hua CMA (AAMA), [name]    . S/P lumbar spinal fusion 07/02/2012  . Tubulovillous adenoma of colon 2008   Allergies  Allergen Reactions  . Crestor [Rosuvastatin]   . Erythromycin     rash  . Lansoprazole   . Penicillins     rash  . Potassium-Containing Compounds Other (See Comments)    Stomach upset,  can take pills, but not the packets   . Robaxin [Methocarbamol]     Messed up stomach  . Sulfonamide Derivatives     rash  . Trental [Pentoxifylline Er]     Bad headache    Social History   Socioeconomic History  . Marital status: Widowed    Spouse name: Not on file  . Number of children: Not on file  . Years of education: Not on file  . Highest education level: Not on file  Occupational History  . Not on file  Tobacco Use  . Smoking status: Former Smoker    Packs/day: 1.00    Years: 38.00    Pack years: 38.00    Types: Cigarettes    Quit date: 10/28/1997    Years since quitting: 22.5  . Smokeless tobacco: Never Used  Vaping Use  . Vaping Use: Never used  Substance and Sexual Activity  . Alcohol use: No  . Drug use: No  . Sexual activity: Never  Other Topics Concern  . 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   Social Determinants of Health   Financial Resource Strain:   . Difficulty of Paying Living Expenses:   Food Insecurity:   . Worried About Charity fundraiser in the Last Year:   . Arboriculturist in the Last Year:   Transportation Needs:   . Lexicographer (Medical):   Marland Kitchen Lack of Transportation (Non-Medical):   Physical Activity:   . Days of Exercise per Week:   . Minutes of Exercise per Session:   Stress:   . Feeling of Stress :   Social Connections:   . Frequency of Communication with Friends and Family:   . Frequency of Social Gatherings with Friends and Family:   . Attends Religious Services:   . Active Member of Clubs or Organizations:   . Attends Archivist Meetings:   Marland Kitchen Marital Status:     Vitals:   05/14/20 0849  BP: 128/80  Pulse: 70  Temp: 97.9 F (36.6 C)  SpO2: 92%   Wt Readings from Last 3 Encounters:  05/14/20 245 lb 2 oz (111.2 kg)  05/11/20 245 lb (111.1 kg)  04/21/20 244 lb 13.9 oz (111.1 kg)    Body mass index is 46.32 kg/m.  Physical Exam Vitals and nursing note reviewed.  Constitutional:      General: She is not in acute distress.    Appearance: She is well-developed.  HENT:     Head: Normocephalic and atraumatic.     Mouth/Throat:     Mouth: Mucous membranes are dry.  Eyes:     Conjunctiva/sclera: Conjunctivae normal.  Cardiovascular:     Rate and Rhythm: Normal rate and regular rhythm. Occasional extrasystoles are present.    Pulses:          Dorsalis pedis pulses are 2+ on the right side and 2+ on the left side.     Heart sounds: No murmur heard.      Comments: LE pitting edema, R>L 1-2+. There is no calves pain or erythema. Pulmonary:     Effort: Pulmonary effort is normal. No respiratory distress.     Breath sounds: Normal breath sounds.  Abdominal:     Palpations: Abdomen is soft. There is no hepatomegaly or mass.     Tenderness: There is abdominal tenderness in the right lower quadrant and periumbilical area. There is no guarding or rebound.  Comments: Tenderness with deep palpation of right inguinal area. I felt once a protruded area upon valsalva, elicited pain when pressing down. Examination is difficult due to body habits.  Musculoskeletal:     Lumbar  back: Tenderness present. No bony tenderness.       Back:     Comments: Antalgic gait.  Lymphadenopathy:     Cervical: No cervical adenopathy.  Skin:    General: Skin is warm.     Findings: No erythema or rash.  Neurological:     Mental Status: She is alert and oriented to person, place, and time.     Cranial Nerves: No cranial nerve deficit.     Gait: Gait normal.  Psychiatric:     Comments: Well groomed, good eye contact.   ASSESSMENT AND PLAN:  [name] was seen today for follow-up.  Orders Placed This Encounter  Procedures  . Ambulatory referral to General Surgery    Constipation, unspecified constipation type Linzess 145 mcg caused diarreha, she would like to try lower dose. Linzaess 72 mcg sent to her pharmacy. Adequate fiber and fluid intake.  -     linaclotide (LINZESS) 72 MCG capsule; Take 1 capsule (72 mcg total) by mouth daily before breakfast.  Pain in both lower extremities R>L. Among some. ? Radicular pain, vain disease I recommend discussing problem with her ortho,she may need a lumbar MRI. Continue monitoring. Instructed about warning signs.  Right groin pain ? Inguinal hernia. She had abdominal/pelvic CT on 11/08/18 that showed severe diffuse colonic diverticulosis. She does not want to have anotehr one. She agrees with surgeon consultation.  Others to consider: Hip OA,radicular.  Bilateral lower extremity edema I still think vein disease is a contributing factor. LE elevation helps,so recommend doing so a few time per days and above waist level. For now I do not recommend adding another diuretic, she is already on HCTZ for HTN and on K+ for hypoK+.  Chronic bilateral low back pain, unspecified whether sciatica present Following with ortho. This could be contributing to LE pain.  Morbid obesity with BMI of 45.0-49.9, adult ([place]) We discussed benefits of wt loss as well as adverse effects of obesity. Wt loss will help with back  pain and LE pain/edema. Consistency with healthy diet and physical activity as tolerated recommended.   Return in about 3 months (around 08/14/2020).   Adalee Kathan G. [name], MD  [facility]. [place] office.   ? Coming from your back,pinched nerve. ? Hip arthritis, ? Hernia, vein disease.  Vein doctor already said it was veins. Elevate legs above heart level a few times per week.  ? Inguinal hernia, surgeon appt will be arranged.  Today Linzess dose decreased.  Continue working on wt loss. Max calories per day 1800.  If you need refills please call your pharmacy. Do not use My Chart to request refills or for acute issues that need immediate attention.   Groin pain could be a hernia.  Please be sure medication list is accurate. If a new problem present, please set up appointment sooner than planned today.

## 2020-05-16 ENCOUNTER — Encounter: Payer: Self-pay | Admitting: Family Medicine

## 2020-06-03 ENCOUNTER — Telehealth: Payer: Self-pay | Admitting: Family Medicine

## 2020-06-03 NOTE — Telephone Encounter (Signed)
Pt called to see if she still needs to go to the referral at [facility] [place] [facility] because she has a pinch nerve in the back  Please advise

## 2020-06-04 NOTE — Telephone Encounter (Signed)
Does she still need to go to [facility]?

## 2020-06-04 NOTE — Telephone Encounter (Signed)
Referral to surgeon was placed because question of right inguinal hernia, she has had groin pain. I would like for her to keep appt. Thanks, BJ

## 2020-06-07 NOTE — Telephone Encounter (Signed)
I spoke with patient, she is aware of message below.  

## 2020-06-09 ENCOUNTER — Other Ambulatory Visit: Payer: Self-pay | Admitting: *Deleted

## 2020-06-09 DIAGNOSIS — I4891 Unspecified atrial fibrillation: Secondary | ICD-10-CM

## 2020-06-25 DIAGNOSIS — H5203 Hypermetropia, bilateral: Secondary | ICD-10-CM | POA: Diagnosis not present

## 2020-06-25 DIAGNOSIS — H524 Presbyopia: Secondary | ICD-10-CM | POA: Diagnosis not present

## 2020-06-25 DIAGNOSIS — E1136 Type 2 diabetes mellitus with diabetic cataract: Secondary | ICD-10-CM | POA: Diagnosis not present

## 2020-06-25 DIAGNOSIS — H25813 Combined forms of age-related cataract, bilateral: Secondary | ICD-10-CM | POA: Diagnosis not present

## 2020-06-28 DIAGNOSIS — R1031 Right lower quadrant pain: Secondary | ICD-10-CM | POA: Diagnosis not present

## 2020-07-03 ENCOUNTER — Other Ambulatory Visit: Payer: Self-pay | Admitting: Family Medicine

## 2020-07-03 DIAGNOSIS — I10 Essential (primary) hypertension: Secondary | ICD-10-CM

## 2020-07-05 ENCOUNTER — Telehealth: Payer: Self-pay | Admitting: Family Medicine

## 2020-07-05 NOTE — Telephone Encounter (Signed)
Patient was wanting to know if she is eligible for the booster and when and where can she get the booster.  Please advise

## 2020-07-05 NOTE — Telephone Encounter (Signed)
Patient is scheduled for 10/12 at 8 AM

## 2020-07-05 NOTE — Telephone Encounter (Signed)
Left message for patient to call back and schedule Medicare Annual Wellness Visit (AWV) either virtually or in office.  Last AWV 05/16/2019; please schedule at anytime with [facility] Nurse Health Advisor 2.  This should be a 45 minute visit.

## 2020-07-06 NOTE — Telephone Encounter (Signed)
I spoke with pt. She is aware she can get the booster, it needs to be 8 months from her last vaccine & can be given at the pharmacy. Pt verbalized understanding.

## 2020-07-20 ENCOUNTER — Ambulatory Visit (INDEPENDENT_AMBULATORY_CARE_PROVIDER_SITE_OTHER): Payer: Medicare PPO

## 2020-07-20 ENCOUNTER — Other Ambulatory Visit: Payer: Self-pay

## 2020-07-20 VITALS — Temp 98.2°F | Wt 249.3 lb

## 2020-07-20 DIAGNOSIS — Z0000 Encounter for general adult medical examination without abnormal findings: Secondary | ICD-10-CM

## 2020-07-20 DIAGNOSIS — Z23 Encounter for immunization: Secondary | ICD-10-CM | POA: Diagnosis not present

## 2020-07-20 NOTE — Progress Notes (Addendum)
Subjective:   [name] is a 69 y.o. female who presents for Medicare Annual (Subsequent) preventive examination.  Review of Systems    Cardiac Risk Factors include: advanced age (>32men, >31 women);diabetes mellitus;dyslipidemia;hypertension;obesity (BMI >30kg/m2);sedentary lifestyle     Objective:    Today's Vitals   07/20/20 0808  Temp: 98.2 F (36.8 C)  Weight: 249 lb 4.8 oz (113.1 kg)   Body mass index is 47.1 kg/m.  Advanced Directives 07/20/2020 02/01/2018 04/27/2017 02/22/2015  Does Patient Have a Medical Advance Directive? No No No No  Would patient like information on creating a medical advance directive? No - Patient declined - - No - patient declined information    Current Medications (verified) Outpatient Encounter Medications as of 07/20/2020  Medication Sig   amLODipine (NORVASC) 5 MG tablet Take 1 tablet (5 mg total) by mouth daily.   apixaban (ELIQUIS) 5 MG TABS tablet Take 1 tablet (5 mg total) by mouth 2 (two) times daily.   atenolol (TENORMIN) 25 MG tablet Take 1 tablet (25 mg total) by mouth daily.   azelastine (ASTELIN) 0.1 % nasal spray Place 2 sprays into both nostrils 2 (two) times daily. Use in each nostril as directed   calcium carbonate (TUMS EX) 750 MG chewable tablet Chew 2 tablets by mouth daily as needed for heartburn.   cetirizine (ZYRTEC) 10 MG tablet Take 10 mg by mouth daily. Alternating with Allegra   cyclobenzaprine (FLEXERIL) 10 MG tablet Take 2.5 mg by mouth daily as needed (pain level 8 or greater).    diclofenac sodium (VOLTAREN) 1 % GEL APPLY (4G) BY TOPICAL ROUTE 4 TIMES EVERY DAY TO THE AFFECTED AREA(S) (WC PA)   diphenhydrAMINE (BENADRYL) 25 MG tablet Take 25 mg by mouth daily as needed.   fexofenadine (ALLEGRA) 60 MG tablet Take 60 mg by mouth daily.   hydrochlorothiazide (HYDRODIURIL) 25 MG tablet TAKE 1 TABLET BY MOUTH ONCE DAILY(DUE FOR FOLLOW UP)   HYDROcodone-acetaminophen (NORCO/VICODIN) 5-325 MG  tablet Take 0.5 tablets by mouth daily as needed (pain level 8 or greater).   LORazepam (ATIVAN) 0.5 MG tablet Take 0.5-1 tablets (0.25-0.5 mg total) by mouth daily as needed for anxiety.   montelukast (SINGULAIR) 10 MG tablet Take 1 tablet (10 mg total) by mouth at bedtime.   omeprazole (PRILOSEC) 40 MG capsule Take 1 capsule (40 mg total) by mouth daily.   Polyethylene Glycol 3350 (MIRALAX PO) Take by mouth.   potassium chloride (KLOR-CON) 10 MEQ tablet Take 2 tablets (20 mEq total) by mouth daily.   pravastatin (PRAVACHOL) 20 MG tablet Take by mouth twice a week   trolamine salicylate (ASPERCREME) 10 % cream Apply 1 application topically as needed for muscle pain.   fluticasone (FLONASE) 50 MCG/ACT nasal spray Use 2 spray(s) in each nostril once daily (Patient not taking: Reported on 07/20/2020)   guaiFENesin (MUCINEX) 600 MG 12 hr tablet Take 300 mg by mouth as needed. (Patient not taking: Reported on 07/20/2020)   hydrocortisone 2.5 % ointment APPLY TWICE A DAY (Patient not taking: Reported on 07/20/2020)   linaclotide (LINZESS) 72 MCG capsule Take 1 capsule (72 mcg total) by mouth daily before breakfast. (Patient not taking: Reported on 07/20/2020)   Menthol-Camphor ([place]) Apply 1 application topically as needed. (Patient not taking: Reported on 07/20/2020)   Multiple Vitamins-Minerals (MULTI-VITAMIN GUMMIES PO) Take by mouth as needed. (Patient not taking: Reported on 07/20/2020)   No facility-administered encounter medications on file as of 07/20/2020.  Allergies (verified) Crestor [rosuvastatin], Erythromycin, Lansoprazole, Penicillins, Potassium-containing compounds, Robaxin [methocarbamol], Sulfonamide derivatives, and Trental [pentoxifylline er]   History: Past Medical History:  Diagnosis Date   Abdominal pain, epigastric 12/28/2008   Centricity Description: ABDOMINAL PAIN, EPIGASTRIC Qualifier: Diagnosis of  By: Julaine Hua CMA (AAMA),  [name]   Centricity Description: ABDOMINAL PAIN-EPIGASTRIC Qualifier: Diagnosis of  By: [name], [name]    Allergic rhinitis 09/16/2013   Allergy    Anemia    Anxiety    Anxiety disorder, unspecified 07/06/2017   Arthritis    Blood in stool    BMI 45.0-49.9, adult ([place]) 07/26/2016   Class 3 obesity with serious comorbidity and body mass index (BMI) of 45.0 to 49.9 in adult 03/07/2017   DDD (degenerative disc disease), lumbar    DIVERTICULOSIS-COLON 12/28/2008   Qualifier: Diagnosis of  By: [name], [name]    GERD 12/28/2008   Qualifier: Diagnosis of  By: [name], [name]    GERD (gastroesophageal reflux disease)    Headache    Hyperglycemia 03/17/2014   Hypertension    Hypokalemia 07/26/2016   IBS (irritable bowel syndrome)-C-D 07/26/2016   Obesity    Other and unspecified hyperlipidemia 03/17/2014   PERSONAL HX COLONIC POLYPS 12/28/2008   Qualifier: Diagnosis of  By: [name], [name]    Reflux esophagitis 12/28/2008   Qualifier: Diagnosis of  By: [name], [name]     S/P lumbar spinal fusion 07/02/2012   Tubulovillous adenoma of colon 2008   Past Surgical History:  Procedure Laterality Date   ABDOMINAL HYSTERECTOMY     COLONOSCOPY  2014   laser surgery to remove scar     not sure of the year; after hyst   RADIOLOGY WITH ANESTHESIA N/A 02/25/2015   Procedure: MRI OF LUMBAR SPINE;  Surgeon: Medication Radiologist, MD;  Location: [place];  Service: Radiology;  Laterality: N/A;  DR. Bennie Pierini COHEN/MRI   SPINE SURGERY     TONSILLECTOMY AND ADENOIDECTOMY     Family History  Problem Relation Age of Onset   Hypertension Mother    Hyperlipidemia Mother    Hyperlipidemia Father    Hypertension Father    Arthritis Father    Hyperlipidemia Sister    Cervical cancer Sister    Diabetes Sister    Heart disease Sister    Cancer Maternal Grandfather    Hypertension Paternal Grandmother    Pancreatic cancer Maternal Aunt     Breast cancer Paternal Aunt    Breast cancer Cousin    Social History   Socioeconomic History   Marital status: Widowed    Spouse name: Not on file   Number of children: Not on file   Years of education: Not on file   Highest education level: Not on file  Occupational History   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   Social Determinants of Health   Financial Resource Strain: Low Risk  Difficulty of Paying Living Expenses: Not hard at all  Food Insecurity: No Food Insecurity   Worried About [place] in the Last Year: Never true   Ran Out of Food in the Last Year: Never true  Transportation Needs: No Transportation Needs   Lack of Transportation (Medical): No   Lack of Transportation (Non-Medical): No  Physical Activity: Inactive   Days of Exercise per Week: 0 days   Minutes of Exercise per Session: 0 min  Stress: No Stress Concern Present   Feeling of Stress : Only a little  Social Connections: Moderately Isolated   Frequency of Communication with Friends and Family: More than three times a week   Frequency of Social Gatherings with Friends and Family: Never   Attends Religious Services: 1 to 4 times per year   Active Member of [employer] or Organizations: No   Attends Archivist Meetings: Never   Marital Status: Widowed    Tobacco Counseling Counseling given: Not Answered   Clinical Intake:  Pre-visit preparation completed: Yes  Pain : No/denies pain     BMI -  recorded: 47.1 Nutritional Status: BMI > 30  Obese Nutritional Risks: Nausea/ vomitting/ diarrhea (only with some meds) Diabetes: Yes CBG done?: No Did pt. bring in CBG monitor from home?: No  How often do you need to have someone help you when you read instructions, pamphlets, or other written materials from your doctor or pharmacy?: 1 - Never  Diabetic?Yes  Interpreter Needed?: No  Information entered by :: Charlott Rakes, LPN   Activities of Daily Living In your present state of health, do you have any difficulty performing the following activities: 07/20/2020  Hearing? Y  Comment mild loss  Vision? N  Difficulty concentrating or making decisions? N  Walking or climbing stairs? Y  Dressing or bathing? Y  Comment shoes and knee hi are difficult at times  Doing errands, shopping? N  Preparing Food and eating ? N  Using the Toilet? N  In the past six months, have you accidently leaked urine? Y  Comment urgency at times and pressure since bladder, hysterectomy  Do you have problems with loss of bowel control? Y  Comment leakage at times wear pads  Managing your Medications? N  Managing your Finances? N  Housekeeping or managing your Housekeeping? N  Some recent data might be hidden    Patient Care Team: [name], [name], MD as PCP - General (Family Medicine) Nahser, Wonda Cheng, MD as PCP - Cardiology (Cardiology)  Indicate any recent Medical Services you may have received from other than [facility] providers in the past year (date may be approximate).     Assessment:   This is a routine wellness examination for [name].  Hearing/Vision screen  Hearing Screening   125Hz  250Hz  500Hz  1000Hz  2000Hz  3000Hz  4000Hz  6000Hz  8000Hz   Right ear:           Left ear:           Comments: Pt states a mild loss   Vision Screening Comments: Dr follows up with Dr Delia Heady for annually eye exams  Dietary issues and exercise activities discussed: Current Exercise Habits: The patient does  not participate in regular exercise at present  Goals     Patient Stated     Plan a trip and travel when you can.      Patient Stated     Lose weight      Depression Screen [facility] 2/9 Scores 07/20/2020 05/16/2019 02/01/2018  04/27/2017 03/14/2016 02/17/2016 01/12/2016  PHQ - 2 Score 1 0 0 0 0 0 0    Fall Risk Fall Risk  07/20/2020 05/16/2019 02/01/2018 04/27/2017 03/14/2016  Falls in the past year? 1 0 Yes No No  Comment - - - - -  Number falls in past yr: 1 0 2 or more - -  Injury with Fall? 1 0 No - -  Comment bruised righ thip falling out of bed - - - -  Risk for fall due to : Impaired balance/gait;Impaired mobility Orthopedic patient - - -  Follow up Falls prevention discussed Education provided Education provided - -    Any stairs in or around the home? Yes  If so, are there any without handrails? Yes  Home free of loose throw rugs in walkways, pet beds, electrical cords, etc? Yes  Adequate lighting in your home to reduce risk of falls? Yes   ASSISTIVE DEVICES UTILIZED TO PREVENT FALLS:  Life alert? No  Use of a cane, walker or w/c? No  Grab bars in the bathroom? No  Shower chair or bench in shower? No  Elevated toilet seat or a handicapped toilet? Yes   TIMED UP AND GO:  Was the test performed? Yes .  Length of time to ambulate 10 feet: 10 sec.   Gait slow and steady without use of assistive device  Cognitive Function: MMSE - Mini Mental State Exam 02/01/2018  Not completed: Refused     6CIT Screen 07/20/2020  What Year? 0 points  What month? 0 points  Count back from 20 0 points  Months in reverse 0 points  Repeat phrase 0 points    Immunizations Immunization History  Administered Date(s) Administered   Fluad Quad(high Dose 65+) 07/20/2020   Influenza Split 07/02/2012, 07/22/2013   Influenza,inj,Quad PF,6+ Mos 06/16/2014, 07/26/2016, 07/06/2017, 07/09/2018   PFIZER SARS-COV-2 Vaccination 10/31/2019, 11/21/2019   Pneumococcal Conjugate-13 05/16/2019    Pneumococcal Polysaccharide-23 04/27/2017   Tdap 04/08/2008, 01/07/2018    TDAP status: Up to date Flu Vaccine status: Completed at today's visit Pneumococcal vaccine status: Up to date Covid-19 vaccine status: Completed vaccines  Qualifies for Shingles Vaccine? Yes   Zostavax completed No   Shingrix Completed?: No.    Education has been provided regarding the importance of this vaccine. Patient has been advised to call insurance company to determine out of pocket expense if they have not yet received this vaccine. Advised may also receive vaccine at local pharmacy or Health Dept. Verbalized acceptance and understanding.  Screening Tests Health Maintenance  Topic Date Due   FOOT EXAM  05/15/2020   URINE MICROALBUMIN  05/15/2020   OPHTHALMOLOGY EXAM  07/23/2020   HEMOGLOBIN A1C  07/28/2020   MAMMOGRAM  08/14/2021   PNA vac Low Risk Adult (2 of 2 - PPSV23) 04/27/2022   COLONOSCOPY  10/17/2023   TETANUS/TDAP  01/08/2028   INFLUENZA VACCINE  Completed   DEXA SCAN  Completed   COVID-19 Vaccine  Completed   Hepatitis C Screening  Completed    Health Maintenance  Health Maintenance Due  Topic Date Due   FOOT EXAM  05/15/2020   URINE MICROALBUMIN  05/15/2020    Colorectal cancer screening: Completed 10/16/2018. Repeat every 5 years Mammogram status: Completed 08/15/19. Repeat every year Bone Density status: Completed 08/15/19. Results reflect: Bone density results: NORMAL. Repeat every 2 years.   Additional Screening:  Hepatitis C Screening:  Completed 02/17/16  Vision Screening: Recommended annual ophthalmology exams for early detection of glaucoma and  other disorders of the eye. Is the patient up to date with their annual eye exam?  Yes  Who is the provider or what is the name of the office in which the patient attends annual eye exams? Dr Gershon Crane  Dental Screening: Recommended annual dental exams for proper oral hygiene  Community Resource Referral / Chronic  Care Management: CRR required this visit?  No   CCM required this visit?  No      Plan:     I have personally reviewed and noted the following in the patients chart:    Medical and social history  Use of alcohol, tobacco or illicit drugs   Current medications and supplements  Functional ability and status  Nutritional status  Physical activity  Advanced directives  List of other physicians  Hospitalizations, surgeries, and ER visits in previous 12 months  Vitals  Screenings to include cognitive, depression, and falls  Referrals and appointments  In addition, I have reviewed and discussed with patient certain preventive protocols, quality metrics, and best practice recommendations. A written personalized care plan for preventive services as well as general preventive health recommendations were provided to patient.     Willette Brace, LPN   11/[id number]/[id number]   Nurse Notes: None

## 2020-07-20 NOTE — Patient Instructions (Addendum)
[name] , Thank you for taking time to come for your Medicare Wellness Visit. I appreciate your ongoing commitment to your health goals. Please review the following plan we discussed and let me know if I can assist you in the future.   Screening recommendations/referrals: Colonoscopy: Done 10/16/18 Mammogram: Done 08/15/19 Bone Density: Done 08/15/19 Recommended yearly ophthalmology/optometry visit for glaucoma screening and checkup Recommended yearly dental visit for hygiene and checkup  Vaccinations: Influenza vaccine: Done 07/20/20 Pneumococcal vaccine: Up to date Tdap vaccine: Up to date Shingles vaccine: Shingrix discussed. Please contact your pharmacy for coverage information.    Covid-19:Completed 1/22 & 11/21/19  Advanced directives: Advance directive discussed with you today. Even though you declined this today please call our office should you change your mind and we can give you the proper paperwork for you to fill out.  Conditions/risks identified: Lose weight   Next appointment: Follow up in one year for your annual wellness visit    Preventive Care 65 Years and Older, Female Preventive care refers to lifestyle choices and visits with your health care provider that can promote health and wellness. What does preventive care include?  A yearly physical exam. This is also called an annual well check.  Dental exams once or twice a year.  Routine eye exams. Ask your health care provider how often you should have your eyes checked.  Personal lifestyle choices, including:  Daily care of your teeth and gums.  Regular physical activity.  Eating a healthy diet.  Avoiding tobacco and drug use.  Limiting alcohol use.  Practicing safe sex.  Taking low-dose aspirin every day.  Taking vitamin and mineral supplements as recommended by your health care provider. What happens during an annual well check? The services and screenings done by your health care provider during  your annual well check will depend on your age, overall health, lifestyle risk factors, and family history of disease. Counseling  Your health care provider may ask you questions about your:  Alcohol use.  Tobacco use.  Drug use.  Emotional well-being.  Home and relationship well-being.  Sexual activity.  Eating habits.  History of falls.  Memory and ability to understand (cognition).  Work and work Statistician.  Reproductive health. Screening  You may have the following tests or measurements:  Height, weight, and BMI.  Blood pressure.  Lipid and cholesterol levels. These may be checked every 5 years, or more frequently if you are over 26 years old.  Skin check.  Lung cancer screening. You may have this screening every year starting at age 51 if you have a 30-pack-year history of smoking and currently smoke or have quit within the past 15 years.  Fecal occult blood test (FOBT) of the stool. You may have this test every year starting at age 81.  Flexible sigmoidoscopy or colonoscopy. You may have a sigmoidoscopy every 5 years or a colonoscopy every 10 years starting at age 22.  Hepatitis C blood test.  Hepatitis B blood test.  Sexually transmitted disease (STD) testing.  Diabetes screening. This is done by checking your blood sugar (glucose) after you have not eaten for a while (fasting). You may have this done every 1-3 years.  Bone density scan. This is done to screen for osteoporosis. You may have this done starting at age 14.  Mammogram. This may be done every 1-2 years. Talk to your health care provider about how often you should have regular mammograms. Talk with your health care provider about your test  results, treatment options, and if necessary, the need for more tests. Vaccines  Your health care provider may recommend certain vaccines, such as:  Influenza vaccine. This is recommended every year.  Tetanus, diphtheria, and acellular pertussis (Tdap,  Td) vaccine. You may need a Td booster every 10 years.  Zoster vaccine. You may need this after age 7.  Pneumococcal 13-valent conjugate (PCV13) vaccine. One dose is recommended after age 63.  Pneumococcal polysaccharide (PPSV23) vaccine. One dose is recommended after age 30. Talk to your health care provider about which screenings and vaccines you need and how often you need them. This information is not intended to replace advice given to you by your health care provider. Make sure you discuss any questions you have with your health care provider. Document Released: 10/22/2015 Document Revised: 06/14/2016 Document Reviewed: 07/27/2015 Elsevier Interactive Patient Education  2017 [place] Prevention in the Home Falls can cause injuries. They can happen to people of all ages. There are many things you can do to make your home safe and to help prevent falls. What can I do on the outside of my home?  Regularly fix the edges of walkways and driveways and fix any cracks.  Remove anything that might make you trip as you walk through a door, such as a raised step or threshold.  Trim any bushes or trees on the path to your home.  Use bright outdoor lighting.  Clear any walking paths of anything that might make someone trip, such as rocks or tools.  Regularly check to see if handrails are loose or broken. Make sure that both sides of any steps have handrails.  Any raised decks and porches should have guardrails on the edges.  Have any leaves, snow, or ice cleared regularly.  Use sand or salt on walking paths during winter.  Clean up any spills in your garage right away. This includes oil or grease spills. What can I do in the bathroom?  Use night lights.  Install grab bars by the toilet and in the tub and shower. Do not use towel bars as grab bars.  Use non-skid mats or decals in the tub or shower.  If you need to sit down in the shower, use a plastic, non-slip  stool.  Keep the floor dry. Clean up any water that spills on the floor as soon as it happens.  Remove soap buildup in the tub or shower regularly.  Attach bath mats securely with double-sided non-slip rug tape.  Do not have throw rugs and other things on the floor that can make you trip. What can I do in the bedroom?  Use night lights.  Make sure that you have a light by your bed that is easy to reach.  Do not use any sheets or blankets that are too big for your bed. They should not hang down onto the floor.  Have a firm chair that has side arms. You can use this for support while you get dressed.  Do not have throw rugs and other things on the floor that can make you trip. What can I do in the kitchen?  Clean up any spills right away.  Avoid walking on wet floors.  Keep items that you use a lot in easy-to-reach places.  If you need to reach something above you, use a strong step stool that has a grab bar.  Keep electrical cords out of the way.  Do not use floor polish or wax that makes  floors slippery. If you must use wax, use non-skid floor wax.  Do not have throw rugs and other things on the floor that can make you trip. What can I do with my stairs?  Do not leave any items on the stairs.  Make sure that there are handrails on both sides of the stairs and use them. Fix handrails that are broken or loose. Make sure that handrails are as long as the stairways.  Check any carpeting to make sure that it is firmly attached to the stairs. Fix any carpet that is loose or worn.  Avoid having throw rugs at the top or bottom of the stairs. If you do have throw rugs, attach them to the floor with carpet tape.  Make sure that you have a light switch at the top of the stairs and the bottom of the stairs. If you do not have them, ask someone to add them for you. What else can I do to help prevent falls?  Wear shoes that:  Do not have high heels.  Have rubber bottoms.  Are  comfortable and fit you well.  Are closed at the toe. Do not wear sandals.  If you use a stepladder:  Make sure that it is fully opened. Do not climb a closed stepladder.  Make sure that both sides of the stepladder are locked into place.  Ask someone to hold it for you, if possible.  Clearly mark and make sure that you can see:  Any grab bars or handrails.  First and last steps.  Where the edge of each step is.  Use tools that help you move around (mobility aids) if they are needed. These include:  Canes.  Walkers.  Scooters.  Crutches.  Turn on the lights when you go into a dark area. Replace any light bulbs as soon as they burn out.  Set up your furniture so you have a clear path. Avoid moving your furniture around.  If any of your floors are uneven, fix them.  If there are any pets around you, be aware of where they are.  Review your medicines with your doctor. Some medicines can make you feel dizzy. This can increase your chance of falling. Ask your doctor what other things that you can do to help prevent falls. This information is not intended to replace advice given to you by your health care provider. Make sure you discuss any questions you have with your health care provider. Document Released: 07/22/2009 Document Revised: 03/02/2016 Document Reviewed: 10/30/2014 Elsevier Interactive Patient Education  2017 [employer].

## 2020-07-22 ENCOUNTER — Other Ambulatory Visit: Payer: Self-pay | Admitting: Family Medicine

## 2020-07-22 DIAGNOSIS — B86 Scabies: Secondary | ICD-10-CM

## 2020-07-22 NOTE — Progress Notes (Signed)
Cardiology Office Note:    Date:  07/23/2020   ID:  [name], DOB 03-27-1951, MRN [id number]  PCP:  [name], [name], MD  [facility] Cardiologist:  Mertie Moores, MD  [place] Electrophysiologist:  None   Referring MD: [name], [name], MD   Chief Complaint:  Follow-up (Atrial fibrillation)    Patient Profile:    [name] is a 69 y.o. female with:   Paroxysmal atrial fibrillation   Hypertension   Intol of [place]rel  Anxiety   obesity   GERD   Hypertriglyceridemia   Prior CV studies: Event monitor 06/2020 Frequent episodes of atrial fibrillation/flutter  Echocardiogram 05/11/20 EF 60-65, no RWMA, normal RVSF, trivial MR   History of Present Illness:    [name] was last seen by Dr. Acie Fredrickson in July 2021.  She returns for follow-up.  She notes issues with leg swelling.  This is usually almost resolved in the mornings and gets worse throughout the day.  She is interested in compression stockings.  She has occasional sharp chest pains.  She has had these for years without change.  She has no exertional chest heaviness.  She has some shortness of breath with more moderate activities.  She has not had syncope, orthopnea.  She does note issues with hemorrhoidal bleeding.        Past Medical History:  Diagnosis Date  . Abdominal pain, epigastric 12/28/2008   Centricity Description: ABDOMINAL PAIN, EPIGASTRIC Qualifier: Diagnosis of  By: [name], [name]   Centricity Description: ABDOMINAL PAIN-EPIGASTRIC Qualifier: Diagnosis of  By: [name], [name]   . Allergic rhinitis 09/16/2013  . Allergy   . Anemia   . Anxiety   . Anxiety disorder, unspecified 07/06/2017  . Arthritis   . Blood in stool   . BMI 45.0-49.9, adult ([place]) 07/26/2016  . Class 3 obesity with serious comorbidity and body mass index (BMI) of 45.0 to 49.9 in adult 03/07/2017  . DDD (degenerative disc disease), lumbar   . DIVERTICULOSIS-COLON 12/28/2008    Qualifier: Diagnosis of  By: [name], [name]   . GERD 12/28/2008   Qualifier: Diagnosis of  By: [name], [name]   . GERD (gastroesophageal reflux disease)   . Headache   . Hyperglycemia 03/17/2014  . Hypertension   . Hypokalemia 07/26/2016  . IBS (irritable bowel syndrome)-C-D 07/26/2016  . Obesity   . Other and unspecified hyperlipidemia 03/17/2014  . PERSONAL HX COLONIC POLYPS 12/28/2008   Qualifier: Diagnosis of  By: [name], [name]   . Reflux esophagitis 12/28/2008   Qualifier: Diagnosis of  By: Julaine Hua CMA (AAMA), [name]    . S/P lumbar spinal fusion 07/02/2012  . Tubulovillous adenoma of colon 2008    Current Medications: Current Meds  Medication Sig  . amLODipine (NORVASC) 5 MG tablet Take 1 tablet (5 mg total) by mouth daily.  Marland Kitchen apixaban (ELIQUIS) 5 MG TABS tablet Take 1 tablet (5 mg total) by mouth 2 (two) times daily.  Marland Kitchen atenolol (TENORMIN) 25 MG tablet Take 1 tablet (25 mg total) by mouth daily.  Marland Kitchen azelastine (ASTELIN) 0.1 % nasal spray Place 2 sprays into both nostrils 2 (two) times daily. Use in each nostril as directed  . calcium carbonate (TUMS EX) 750 MG chewable tablet Chew 2 tablets by mouth daily as needed for heartburn.  . cetirizine (ZYRTEC) 10 MG tablet Take 10 mg by mouth daily. Alternating with Allegra  . cyclobenzaprine (FLEXERIL) 10 MG tablet Take 2.5  mg by mouth daily as needed (pain level 8 or greater).   Marland Kitchen diclofenac sodium (VOLTAREN) 1 % GEL APPLY (4G) BY TOPICAL ROUTE 4 TIMES EVERY DAY TO THE AFFECTED AREA(S) (WC PA)  . diphenhydrAMINE (BENADRYL) 25 MG tablet Take 25 mg by mouth daily as needed.  . fexofenadine (ALLEGRA) 60 MG tablet Take 60 mg by mouth daily.  . fluticasone (FLONASE) 50 MCG/ACT nasal spray Use 2 spray(s) in each nostril once daily  . guaiFENesin (MUCINEX) 600 MG 12 hr tablet Take 300 mg by mouth as needed.   . hydrochlorothiazide (HYDRODIURIL) 25 MG tablet TAKE 1 TABLET BY MOUTH ONCE DAILY(DUE FOR FOLLOW UP)  .  HYDROcodone-acetaminophen (NORCO/VICODIN) 5-325 MG tablet Take 0.5 tablets by mouth daily as needed (pain level 8 or greater).  . hydrocortisone 2.5 % ointment APPLY TWICE A DAY  . LORazepam (ATIVAN) 0.5 MG tablet Take 0.5-1 tablets (0.25-0.5 mg total) by mouth daily as needed for anxiety.  . Menthol-Camphor (ICY HOT ARTHRITIS PAIN RELIEF EX) Apply 1 application topically as needed.   . montelukast (SINGULAIR) 10 MG tablet Take 1 tablet (10 mg total) by mouth at bedtime.  . Multiple Vitamins-Minerals (MULTI-VITAMIN GUMMIES PO) Take by mouth as needed.   Marland Kitchen omeprazole (PRILOSEC) 40 MG capsule Take 1 capsule (40 mg total) by mouth daily.  . Polyethylene Glycol 3350 (MIRALAX PO) Take by mouth as needed.   . potassium chloride (KLOR-CON) 10 MEQ tablet Take 2 tablets (20 mEq total) by mouth daily.  . pravastatin (PRAVACHOL) 20 MG tablet Take by mouth twice a week  . trolamine salicylate (ASPERCREME) 10 % cream Apply 1 application topically as needed for muscle pain.     Allergies:   Crestor [rosuvastatin], Erythromycin, Lansoprazole, Penicillins, Potassium-containing compounds, Robaxin [methocarbamol], Sulfonamide derivatives, and Trental [pentoxifylline er]   Social History   Tobacco Use  . Smoking status: Former Smoker    Packs/day: 1.00    Years: 38.00    Pack years: 38.00    Types: Cigarettes    Quit date: 10/28/1997    Years since quitting: 22.7  . Smokeless tobacco: Never Used  Vaping Use  . Vaping Use: Never used  Substance Use Topics  . Alcohol use: No  . Drug use: No     Family Hx: The patient's family history includes Arthritis in her father; Breast cancer in her cousin and paternal aunt; Cancer in her maternal grandfather; Cervical cancer in her sister; Diabetes in her sister; Heart disease in her sister; Hyperlipidemia in her father, mother, and sister; Hypertension in her father, mother, and paternal grandmother; Pancreatic cancer in her maternal aunt.  Review of Systems    Gastrointestinal: Positive for hematochezia (hemorrhoidal bleedingb).     EKGs/Labs/Other Test Reviewed:    EKG:  EKG is  ordered today.  The ekg ordered today demonstrates sinus bradycardia, HR 56, normal axis, QTC 430, no ST-T wave changes  Recent Labs: 01/27/2020: ALT 16; BUN 22; Creatinine, Ser 0.89; Potassium 4.3; Sodium 140 04/16/2020: Brain Natriuretic Peptide CANCELED; Hemoglobin 14.1; Platelets 243; TSH 2.12 04/21/2020: NT-Pro BNP 134   Recent Lipid Panel Lab Results  Component Value Date/Time   CHOL 167 01/27/2020 10:07 AM   TRIG 105.0 01/27/2020 10:07 AM   HDL 55.60 01/27/2020 10:07 AM   CHOLHDL 3 01/27/2020 10:07 AM   LDLCALC 91 01/27/2020 10:07 AM    Risk Assessment/Calculations:     CHA2DS2-VASc Score = 4  This indicates a 4.8% annual risk of stroke. The patient's score is based upon:  CHF History: 0 HTN History: 1 Diabetes History: 1 Stroke History: 0 Vascular Disease History: 0 Age Score: 1 Gender Score: 1      Physical Exam:    VS:  BP 132/68   Pulse (!) 56   Ht 5\' 1"  (1.549 m)   Wt 264 lb (119.7 kg)   SpO2 97%   BMI 49.88 kg/m     Wt Readings from Last 3 Encounters:  07/23/20 264 lb (119.7 kg)  07/20/20 249 lb 4.8 oz (113.1 kg)  05/14/20 245 lb 2 oz (111.2 kg)     Constitutional:      Appearance: Healthy appearance. Not in distress.  Neck:     Thyroid: No thyromegaly.     Vascular: JVD normal.  Pulmonary:     Effort: Pulmonary effort is normal.     Breath sounds: No wheezing. No rales.  Cardiovascular:     Normal rate. Regular rhythm. Normal S1. Normal S2.     Murmurs: There is no murmur.  Edema:    Pretibial: bilateral trace edema of the pretibial area.    Ankle: bilateral 1+ edema of the ankle. Abdominal:     Palpations: Abdomen is soft.  Skin:    General: Skin is warm and dry.  Neurological:     Mental Status: Alert and oriented to person, place and time.     Cranial Nerves: Cranial nerves are intact.      ASSESSMENT &  PLAN:    1. Paroxysmal atrial fibrillation (HCC) CHA2DS2-VASc Score = 4 [CHF History: 0, HTN History: 1, Diabetes History: 1, Stroke History: 0, Vascular Disease History: 0, Age Score: 1, Gender Score: 1].  Therefore, the patient's annual risk of stroke is 4.8 %.   She is in sinus rhythm today.  She seems to be tolerating anticoagulation.  However, she does have hemorrhoidal bleeding.  Check a BMET, CBC today.  If her blood counts are dropping with hemorrhoidal bleeding, we may need to interrupt her anticoagulation and have her hemorrhoids further evaluated.  Follow-up with Dr. Acie Fredrickson in 6 months.  2. Essential hypertension Fair control.  Continue current therapy.  3. Morbid obesity ([place]) She is currently working on weight loss.  I have encouraged her to keep this up.  4. Snoring She does note a history of snoring.  She now has atrial fibrillation.  We discussed the importance of evaluation for sleep apnea.  She is the primary caregiver for her mother and does not think she could pursue testing. She also has claustrophobia and is not certain she can tolerate a facemask.  I have asked her to notify us if she would like to pursue testing.  5. Leg swelling This seems to be related to venous insufficiency.  I will give her a prescription for furosemide 20 mg to take once daily as needed for swelling.  She should take an extra potassium with this.  I have also given her prescription for lower extremity compression stockings.  If her swelling should get worse, she should return for earlier follow-up.  6. Hemorrhoids, unspecified hemorrhoid type As noted, obtain CBC today.  If her blood count is dropping, we will need to go back to gastroenterology for further evaluation management.    Dispo:  Return in about 6 months (around 01/21/2021) for Routine Follow Up, w/ Dr. Acie Fredrickson, in person.   Medication Adjustments/Labs and Tests Ordered: Current medicines are reviewed at length with the patient today.   Concerns regarding medicines are outlined above.  Tests Ordered:  Orders Placed This Encounter  Procedures  . CBC with Differential/Platelet  . Basic metabolic panel  . EKG 12-Lead   Medication Changes: Meds ordered this encounter  Medications  . furosemide (LASIX) 20 MG tablet    Sig: Take 1 tablet (20 mg total) by mouth daily as needed for edema (Please give our office a call if you take more than 2 days in a row. Please take an extra potassium tablet if taking this medication.).    Dispense:  30 tablet    Refill:  [id number], Richardson Dopp, PA-C  07/23/2020 1:47 PM    [facility] [address], [place], [place]  [address] Phone: [phone]; Fax: [phone]

## 2020-07-23 ENCOUNTER — Ambulatory Visit: Payer: Medicare PPO | Admitting: Physician Assistant

## 2020-07-23 ENCOUNTER — Encounter: Payer: Self-pay | Admitting: Physician Assistant

## 2020-07-23 ENCOUNTER — Other Ambulatory Visit: Payer: Self-pay

## 2020-07-23 VITALS — BP 132/68 | HR 56 | Ht 61.0 in | Wt 264.0 lb

## 2020-07-23 DIAGNOSIS — R0683 Snoring: Secondary | ICD-10-CM | POA: Diagnosis not present

## 2020-07-23 DIAGNOSIS — I480 Paroxysmal atrial fibrillation: Secondary | ICD-10-CM

## 2020-07-23 DIAGNOSIS — M7989 Other specified soft tissue disorders: Secondary | ICD-10-CM | POA: Diagnosis not present

## 2020-07-23 DIAGNOSIS — I10 Essential (primary) hypertension: Secondary | ICD-10-CM

## 2020-07-23 DIAGNOSIS — K649 Unspecified hemorrhoids: Secondary | ICD-10-CM | POA: Diagnosis not present

## 2020-07-23 LAB — BASIC METABOLIC PANEL
BUN/Creatinine Ratio: 18 (ref 12–28)
BUN: 17 mg/dL (ref 8–27)
CO2: 24 mmol/L (ref 20–29)
Calcium: 9.8 mg/dL (ref 8.7–10.3)
Chloride: 100 mmol/L (ref 96–106)
Creatinine, Ser: 0.95 mg/dL (ref 0.57–1.00)
GFR calc Af Amer: 72 mL/min/{1.73_m2} (ref >59)
GFR calc non Af Amer: 63 mL/min/{1.73_m2} (ref >59)
Glucose: 115 mg/dL — ABNORMAL HIGH (ref 65–99)
Potassium: 3.9 mmol/L (ref 3.5–5.2)
Sodium: 141 mmol/L (ref 134–144)

## 2020-07-23 LAB — CBC WITH DIFFERENTIAL/PLATELET
Basophils Absolute: 0.1 10*3/uL (ref 0.0–0.2)
Basos: 1 %
EOS (ABSOLUTE): 0.4 10*3/uL (ref 0.0–0.4)
Eos: 7 %
Hematocrit: 38.2 % (ref 34.0–46.6)
Hemoglobin: 12.9 g/dL (ref 11.1–15.9)
Immature Grans (Abs): 0 10*3/uL (ref 0.0–0.1)
Immature Granulocytes: 0 %
Lymphocytes Absolute: 1.6 10*3/uL (ref 0.7–3.1)
Lymphs: 27 %
MCH: 28.6 pg (ref 26.6–33.0)
MCHC: 33.8 g/dL (ref 31.5–35.7)
MCV: 85 fL (ref 79–97)
Monocytes Absolute: 0.8 10*3/uL (ref 0.1–0.9)
Monocytes: 14 %
Neutrophils Absolute: 2.9 10*3/uL (ref 1.4–7.0)
Neutrophils: 51 %
Platelets: 248 10*3/uL (ref 150–450)
RBC: 4.51 x10E6/uL (ref 3.77–5.28)
RDW: 14.1 % (ref 11.7–15.4)
WBC: 5.8 10*3/uL (ref 3.4–10.8)

## 2020-07-23 MED ORDER — FUROSEMIDE 20 MG PO TABS: 20.0000 mg | ORAL_TABLET | Freq: Every day | ORAL | 11 refills | Status: DC | PRN

## 2020-07-23 NOTE — Patient Instructions (Addendum)
  Medication Instructions:  Your physician has recommended you make the following change in your medication:  1-TAKE Furosemide 20 mg by mouth daily as needed for swelling (take 1 extra potassium tablet if you take furosemide and if you take Furosemide more than 2 days in a row give our office a call.)  *If you need a refill on your cardiac medications before your next appointment, please call your pharmacy*   Lab Work: Your physician recommends that you have lab work today- BMET and CBC  If you have labs (blood work) drawn today and your tests are completely normal, you will receive your results only by: Marland Kitchen MyChart Message (if you have MyChart) OR . A paper copy in the mail If you have any lab test that is abnormal or we need to change your treatment, we will call you to review the results.  Follow-Up: At [facility], you and your health needs are our priority.  As part of our continuing mission to provide you with exceptional heart care, we have created designated Provider Care Teams.  These Care Teams include your primary Cardiologist (physician) and Advanced Practice Providers (APPs -  Physician Assistants and Nurse Practitioners) who all work together to provide you with the care you need, when you need it.  We recommend signing up for the patient portal called "MyChart".  Sign up information is provided on this After Visit Summary.  MyChart is used to connect with patients for Virtual Visits (Telemedicine).  Patients are able to view lab/test results, encounter notes, upcoming appointments, etc.  Non-urgent messages can be sent to your provider as well.   To learn more about what you can do with MyChart, go to [email or web address].    Your next appointment:   6 month(s)  The format for your next appointment:   In Person  Provider:   You may see Mertie Moores, MD or one of the following Advanced Practice Providers on your designated Care Team:    Richardson Dopp, PA-C  [place], [place]

## 2020-07-28 ENCOUNTER — Ambulatory Visit: Payer: Medicare PPO | Admitting: Family Medicine

## 2020-08-11 ENCOUNTER — Telehealth: Payer: Self-pay | Admitting: Family Medicine

## 2020-08-11 ENCOUNTER — Other Ambulatory Visit: Payer: Self-pay

## 2020-08-11 ENCOUNTER — Ambulatory Visit: Payer: Medicare PPO | Admitting: Family Medicine

## 2020-08-11 ENCOUNTER — Encounter: Payer: Self-pay | Admitting: Family Medicine

## 2020-08-11 DIAGNOSIS — E1169 Type 2 diabetes mellitus with other specified complication: Secondary | ICD-10-CM | POA: Diagnosis not present

## 2020-08-11 DIAGNOSIS — Z6841 Body Mass Index (BMI) 40.0 and over, adult: Secondary | ICD-10-CM | POA: Diagnosis not present

## 2020-08-11 DIAGNOSIS — L299 Pruritus, unspecified: Secondary | ICD-10-CM | POA: Diagnosis not present

## 2020-08-11 DIAGNOSIS — K5900 Constipation, unspecified: Secondary | ICD-10-CM | POA: Diagnosis not present

## 2020-08-11 DIAGNOSIS — E876 Hypokalemia: Secondary | ICD-10-CM | POA: Diagnosis not present

## 2020-08-11 DIAGNOSIS — I10 Essential (primary) hypertension: Secondary | ICD-10-CM | POA: Diagnosis not present

## 2020-08-11 DIAGNOSIS — F419 Anxiety disorder, unspecified: Secondary | ICD-10-CM | POA: Diagnosis not present

## 2020-08-11 LAB — POCT GLYCOSYLATED HEMOGLOBIN (HGB A1C): HbA1c, POC (prediabetic range): 5.9 % (ref 5.7–6.4)

## 2020-08-11 NOTE — Patient Instructions (Signed)
A few things to remember from today's visit:   Morbid obesity with BMI of 45.0-49.9, adult ([place])  Type 2 diabetes mellitus with other specified complication, without long-term current use of insulin ([place]) - Plan: Microalbumin / creatinine urine ratio  Essential hypertension  Constipation, unspecified constipation type  Continue Miralax and adequate hydration. Decrease amount of gatorade. Continue working on diet.  If you need refills please call your pharmacy. Do not use My Chart to request refills or for acute issues that need immediate attention.    Please be sure medication list is accurate. If a new problem present, please set up appointment sooner than planned today.

## 2020-08-11 NOTE — Assessment & Plan Note (Addendum)
We discussed benefits of wt loss as well as adverse effects of obesity. Consistency with healthy diet and physical activity as tolerated recommended.

## 2020-08-11 NOTE — Telephone Encounter (Signed)
Pt is calling back stating that she needs a refill on Rx hydroxyzine (ATARAX/VISTARIL) 25 MG   Pharm:  [facility] on [address]

## 2020-08-11 NOTE — Progress Notes (Signed)
HPI: [name] is a 69 y.o. female, who is here today for follow up.   She was last seen on 05/14/20.  Since her last visit she has seen cardiologist and ophthalmologist.  LE edema: She has seen She is wearing compression stockings and problem has improved greatly. She is also on Furosemide 20 mg daily as needed. HypoK+: She is on KLOR 10 meq 2 tabs daily. She also drinks gatorade daily , which helps with LE cramps.  Chronic back pain: She follows with pain management. She was evaluated by surgeon because right groin pain, concerned about hernia. She was told pain was related to back pain, radiculopathy. LE pain intermittent due to spinal stenosis.  Constipation: She is taking Miralax more frequent and it is helping. She did not continue Linzess because caused cramps.She would like to keep Linzess on hand in case she needs it. She has not noted melena or blood in stool lately.  HTN: She is following with cardiologist q 6 months. Taking Atenolol 25 mg daily,HCTZ 25 mg daily ,and Amlodipine 5 mg daily.  She has had occasional low BP's, 109/50's but in general BP is 130's/70-80's. Negative severe/frequent headache,chest pain, dyspnea, palpitation,and focal weakness.  Lab Results  Component Value Date   CREATININE 0.95 07/23/2020   BUN 17 07/23/2020   NA 141 07/23/2020   K 3.9 07/23/2020   CL 100 07/23/2020   CO2 24 07/23/2020   She is trying to walk around the house, outdoors. If she has pain she cannot always do it. In general she has not made major dietary changes. She has some wt lost, which she attributed to fluid loss with diuretics. She has some sweets during the day: Sugar cookies (up to 9), fruit,muffins among some. Snacks late at night, 1 am sometimes. 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   Requesting refills on medication for itching. She does not remebr name. Taking Benadryl. Pruritus after covid vaccine,no  rash.  Lab Results  Component Value Date   MICROALBUR <0.7 05/16/2019   MICROALBUR <0.7 02/01/2018   DM II: She is on non pharmacologic treatment. Dx'ed in 2018. No chercking BS. Negative for polydipsia,polyuria, or polyphagia. Last HgA1C 6.5 on 01/27/20.  Anxiety: Stress is exacerbated by her mother's health problems. She takes Lorazepam 0.5 mg, she takes 1/2-1 tab daily. She has taken it for years. Medication has helped.  Review of Systems  Constitutional: Positive for fatigue. Negative for activity change, appetite change and fever.  HENT: Negative for mouth sores, nosebleeds and sore throat.   Eyes: Negative for redness and visual disturbance.  Respiratory: Negative for cough and wheezing.   Gastrointestinal: Negative for abdominal pain, nausea and vomiting.       Negative for changes in bowel habits.  Genitourinary: Negative for decreased urine volume and hematuria.  Musculoskeletal: Positive for arthralgias and back pain.  Neurological: Negative for syncope, facial asymmetry and weakness.  Psychiatric/Behavioral: Positive for sleep disturbance. Negative for confusion. The patient is nervous/anxious.   Rest of ROS, see pertinent positives sand negatives in HPI  Current Outpatient Medications on File Prior to Visit  Medication Sig Dispense Refill  . amLODipine (NORVASC) 5 MG tablet Take 1 tablet (5 mg total) by mouth daily. 90 tablet 2  . apixaban (ELIQUIS) 5 MG TABS tablet Take 1 tablet (5 mg total) by mouth 2 (two) times daily. 60 tablet 2  . atenolol (TENORMIN) 25 MG tablet Take 1 tablet (25  mg total) by mouth daily. 90 tablet 2  . azelastine (ASTELIN) 0.1 % nasal spray Place 2 sprays into both nostrils 2 (two) times daily. Use in each nostril as directed 30 mL 11  . calcium carbonate (TUMS EX) 750 MG chewable tablet Chew 2 tablets by mouth daily as needed for heartburn.    . cetirizine (ZYRTEC) 10 MG tablet Take 10 mg by mouth daily. Alternating with Allegra    .  cyclobenzaprine (FLEXERIL) 10 MG tablet Take 2.5 mg by mouth daily as needed (pain level 8 or greater).     Marland Kitchen diclofenac sodium (VOLTAREN) 1 % GEL APPLY (4G) BY TOPICAL ROUTE 4 TIMES EVERY DAY TO THE AFFECTED AREA(S) (WC PA)  1  . fluticasone (FLONASE) 50 MCG/ACT nasal spray Use 2 spray(s) in each nostril once daily 48 g 0  . furosemide (LASIX) 20 MG tablet Take 1 tablet (20 mg total) by mouth daily as needed for edema (Please give our office a call if you take more than 2 days in a row. Please take an extra potassium tablet if taking this medication.). 30 tablet 11  . guaiFENesin (MUCINEX) 600 MG 12 hr tablet Take 300 mg by mouth as needed.     . hydrochlorothiazide (HYDRODIURIL) 25 MG tablet TAKE 1 TABLET BY MOUTH ONCE DAILY(DUE FOR FOLLOW UP) 90 tablet 3  . HYDROcodone-acetaminophen (NORCO/VICODIN) 5-325 MG tablet Take 0.5 tablets by mouth daily as needed (pain level 8 or greater). 30 tablet 0  . hydrocortisone 2.5 % ointment APPLY TWICE A DAY 60 g 11  . LORazepam (ATIVAN) 0.5 MG tablet Take 0.5-1 tablets (0.25-0.5 mg total) by mouth daily as needed for anxiety. 20 tablet 1  . Menthol-Camphor (ICY HOT ARTHRITIS PAIN RELIEF EX) Apply 1 application topically as needed.     . montelukast (SINGULAIR) 10 MG tablet Take 1 tablet (10 mg total) by mouth at bedtime. 30 tablet 1  . Multiple Vitamins-Minerals (MULTI-VITAMIN GUMMIES PO) Take by mouth as needed.     Marland Kitchen omeprazole (PRILOSEC) 40 MG capsule Take 1 capsule (40 mg total) by mouth daily. 60 capsule 11  . Polyethylene Glycol 3350 (MIRALAX PO) Take by mouth as needed.     . potassium chloride (KLOR-CON) 10 MEQ tablet Take 2 tablets (20 mEq total) by mouth daily. 180 tablet 2  . pravastatin (PRAVACHOL) 20 MG tablet Take by mouth twice a week 27 tablet 3  . trolamine salicylate (ASPERCREME) 10 % cream Apply 1 application topically as needed for muscle pain.     No current facility-administered medications on file prior to visit.   Past Medical  History:  Diagnosis Date  . Abdominal pain, epigastric 12/28/2008   Centricity Description: ABDOMINAL PAIN, EPIGASTRIC Qualifier: Diagnosis of  By: [name], [name]   Centricity Description: ABDOMINAL PAIN-EPIGASTRIC Qualifier: Diagnosis of  By: [name], [name]   . Allergic rhinitis 09/16/2013  . Allergy   . Anemia   . Anxiety   . Anxiety disorder, unspecified 07/06/2017  . Arthritis   . Blood in stool   . BMI 45.0-49.9, adult ([place]) 07/26/2016  . Class 3 obesity with serious comorbidity and body mass index (BMI) of 45.0 to 49.9 in adult 03/07/2017  . DDD (degenerative disc disease), lumbar   . DIVERTICULOSIS-COLON 12/28/2008   Qualifier: Diagnosis of  By: [name], [name]   . GERD 12/28/2008   Qualifier: Diagnosis of  By: [name], [name]   . GERD (gastroesophageal reflux disease)   . Headache   .  Hyperglycemia 03/17/2014  . Hypertension   . Hypokalemia 07/26/2016  . IBS (irritable bowel syndrome)-C-D 07/26/2016  . Obesity   . Other and unspecified hyperlipidemia 03/17/2014  . PERSONAL HX COLONIC POLYPS 12/28/2008   Qualifier: Diagnosis of  By: [name], [name]   . Reflux esophagitis 12/28/2008   Qualifier: Diagnosis of  By: Julaine Hua CMA (AAMA), [name]    . S/P lumbar spinal fusion 07/02/2012  . Tubulovillous adenoma of colon 2008   Allergies  Allergen Reactions  . Crestor [Rosuvastatin]   . Erythromycin     rash  . Lansoprazole   . Penicillins     rash  . Potassium-Containing Compounds Other (See Comments)    Stomach upset, can take pills, but not the packets   . Robaxin [Methocarbamol]     Messed up stomach  . Sulfonamide Derivatives     rash  . Trental [Pentoxifylline Er]     Bad headache    Social History   Socioeconomic History  . Marital status: Widowed    Spouse name: Not on file  . Number of children: Not on file  . Years of education: Not on file  . Highest education level: Not on file  Occupational History  . 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 Smoking status: Former Smoker    Packs/day: 1.00    Years: 38.00    Pack years: 38.00    Types: Cigarettes    Quit date: 10/28/1997    Years since quitting: 22.8  . Smokeless tobacco: Never Used  Vaping Use  . Vaping Use: Never used  Substance and Sexual Activity  . Alcohol use: No  . Drug use: No  . Sexual activity: Never  Other Topics Concern  . 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   Social Determinants of Health   Financial Resource Strain: Low Risk   . Difficulty of Paying Living Expenses: Not hard at all  Food Insecurity: No Food Insecurity  . Worried About Charity fundraiser in the Last Year: Never true  . Ran Out of Food in the Last Year: Never true  Transportation Needs: No Transportation Needs  . Lack of Transportation (Medical): No  . Lack of Transportation (Non-Medical): No  Physical Activity: Inactive  . Days of Exercise per Week: 0 days  . Minutes of Exercise per Session: 0 min  Stress: No Stress Concern Present  . Feeling of Stress : Only a little  Social Connections: Moderately Isolated  . Frequency of Communication with Friends and Family: More than three times a week  . Frequency of Social Gatherings with Friends and Family: Never  . Attends Religious Services: 1 to 4 times per year  . Active Member of Clubs or Organizations: No  . Attends Archivist Meetings: Never  . Marital Status: Widowed    Vitals:   08/11/20 0849  BP: 118/76  Pulse: 62  Resp: 16  Temp: 98.3 F (36.8 C)  SpO2: 95%   Body mass index is 45.37 kg/m.  Physical Exam Vitals and nursing note reviewed.  Constitutional:      General: She is not in acute distress.    Appearance: She is well-developed.  HENT:     Head: Normocephalic and atraumatic.     Mouth/Throat:     Mouth: Mucous membranes are moist.     Pharynx: Oropharynx is clear.  Eyes:     Conjunctiva/sclera:  Conjunctivae normal.  Pupils: Pupils are equal, round, and reactive to light.  Cardiovascular:     Rate and Rhythm: Normal rate and regular rhythm.     Pulses:          Dorsalis pedis pulses are 2+ on the right side and 2+ on the left side.     Heart sounds: No murmur heard.      Comments: Trace pitting LE edema. Pulmonary:     Effort: Pulmonary effort is normal. No respiratory distress.     Breath sounds: Normal breath sounds.  Abdominal:     Palpations: Abdomen is soft. There is no hepatomegaly or mass.     Tenderness: There is no abdominal tenderness.  Musculoskeletal:     Comments: Antalgic gait.  Lymphadenopathy:     Cervical: No cervical adenopathy.  Skin:    General: Skin is warm.     Findings: No erythema or rash.  Neurological:     Mental Status: She is alert and oriented to person, place, and time.     Cranial Nerves: No cranial nerve deficit.  Psychiatric:        Mood and Affect: Mood is anxious. Mood is not depressed.     Comments: Well groomed, good eye contact.   ASSESSMENT AND PLAN:  [name] was seen today for follow-up.  Orders Placed This Encounter  Procedures  . Microalbumin / creatinine urine ratio  . POC HgB A1c   Lab Results  Component Value Date   HGBA1C 5.9 08/11/2020   Lab Results  Component Value Date   MICROALBUR 1.6 08/11/2020   MICROALBUR <0.7 05/16/2019    Type 2 diabetes mellitus with other specified complication, without long-term current use of insulin ([place]) HgA1C at goal. Continue non pharmacologic treatment. Regular exercise and healthy diet with avoidance of added sugar food intake is an important part of treatment and recommended. Annual eye exam, periodic dental and foot care recommended. F/U in 5 months  Constipation, unspecified constipation type Continue Miralax daily prn,adequate hydration,and fiber intake. Linzess 70 mg daily if needed, side effects discussed.  Morbid obesity with BMI of  45.0-49.9, adult ([place]) We discussed benefits of wt loss as well as adverse effects of obesity. Consistency with healthy diet and physical activity as tolerated recommended.   Essential hypertension BP adequately controlled. Continue HCTZ 25 mg daily,Atenolol 50 mg daily,and Amlodipine 5 mg daily. Low salt diet. Continue monitoring BP.  Hypokalemia Continue KLOR 10 meq 2 tabs daily, take an extra tab if taking Furosemide.  Anxiety disorder, unspecified Stable. Continue Lorazepam 0.5 daily.  Skin pruritus No rash present. Some side effects of antihistaminics discussed. Advised to avoid Benadryl. She will call to let me know medication she is asking to be refilled for skin prurotus. Moisturizer as needed also will help.  Spent 41 minutes with pt.  During this time history was obtained and documented, examination was performed, prior labs reviewed, and assessment/plan discussed.   Return in about 24 weeks (around 01/26/2021) for cpe.   Danna Sewell G. [name], MD  [facility]. [place] office.  A few things to remember from today's visit:  Morbid obesity with BMI of 45.0-49.9, adult ([place])  Type 2 diabetes mellitus with other specified complication, without long-term current use of insulin ([place]) - Plan: Microalbumin / creatinine urine ratio  Essential hypertension  Constipation, unspecified constipation type  Continue Miralax and adequate hydration. Decrease amount of gatorade. Continue working on diet.  If you need refills please call your pharmacy. Do not  use My Chart to request refills or for acute issues that need immediate attention.    Please be sure medication list is accurate. If a new problem present, please set up appointment sooner than planned today.

## 2020-08-11 NOTE — Assessment & Plan Note (Signed)
BP adequately controlled. Continue HCTZ 25 mg daily,Atenolol 50 mg daily,and Amlodipine 5 mg daily. Low salt diet. Continue monitoring BP.

## 2020-08-11 NOTE — Assessment & Plan Note (Signed)
Stable. Continue Lorazepam 0.5 daily.

## 2020-08-11 NOTE — Assessment & Plan Note (Signed)
Continue KLOR 10 meq 2 tabs daily, take an extra tab if taking Furosemide.

## 2020-08-12 LAB — MICROALBUMIN / CREATININE URINE RATIO
Creatinine, Urine: 143 mg/dL (ref 20–275)
Microalb Creat Ratio: 11 mcg/mg creat (ref <30)
Microalb, Ur: 1.6 mg/dL

## 2020-08-16 ENCOUNTER — Ambulatory Visit: Payer: Medicare PPO | Admitting: Family Medicine

## 2020-08-16 ENCOUNTER — Other Ambulatory Visit: Payer: Self-pay | Admitting: Family Medicine

## 2020-08-16 MED ORDER — HYDROXYZINE HCL 25 MG PO TABS: 25.0000 mg | ORAL_TABLET | Freq: Every day | ORAL | 0 refills | Status: DC | PRN

## 2020-08-16 NOTE — Telephone Encounter (Signed)
Prescription sent. If she is going to take this medication, avoid OTC antihistaminics. Thanks, BJ

## 2020-08-16 NOTE — Telephone Encounter (Signed)
I spoke with pt, she is aware of message below & not to take OTC antihistamines with the medication.

## 2020-08-16 NOTE — Telephone Encounter (Signed)
Okay to refill? This is the medication she didn't remember the name of.

## 2020-08-17 DIAGNOSIS — Z1231 Encounter for screening mammogram for malignant neoplasm of breast: Secondary | ICD-10-CM | POA: Diagnosis not present

## 2020-08-19 ENCOUNTER — Other Ambulatory Visit: Payer: Self-pay | Admitting: Family Medicine

## 2020-10-11 ENCOUNTER — Other Ambulatory Visit: Payer: Self-pay | Admitting: Family Medicine

## 2020-10-14 NOTE — Telephone Encounter (Signed)
Last office visit---08/11/2020 Last refill--04/21/2020 Can this patient still receive a refill on this medicine?

## 2020-10-29 ENCOUNTER — Other Ambulatory Visit: Payer: Self-pay | Admitting: Family Medicine

## 2020-10-29 DIAGNOSIS — I10 Essential (primary) hypertension: Secondary | ICD-10-CM

## 2020-10-29 DIAGNOSIS — E876 Hypokalemia: Secondary | ICD-10-CM

## 2020-11-29 ENCOUNTER — Other Ambulatory Visit: Payer: Self-pay | Admitting: Family Medicine

## 2020-11-29 DIAGNOSIS — I10 Essential (primary) hypertension: Secondary | ICD-10-CM

## 2020-12-10 ENCOUNTER — Encounter: Payer: Self-pay | Admitting: Family Medicine

## 2020-12-10 ENCOUNTER — Ambulatory Visit: Payer: Medicare PPO | Admitting: Family Medicine

## 2020-12-10 ENCOUNTER — Other Ambulatory Visit: Payer: Self-pay

## 2020-12-10 VITALS — BP 130/80 | HR 100 | Temp 97.9°F | Resp 16 | Ht 61.0 in | Wt 248.0 lb

## 2020-12-10 DIAGNOSIS — I10 Essential (primary) hypertension: Secondary | ICD-10-CM

## 2020-12-10 DIAGNOSIS — Z8719 Personal history of other diseases of the digestive system: Secondary | ICD-10-CM

## 2020-12-10 DIAGNOSIS — E785 Hyperlipidemia, unspecified: Secondary | ICD-10-CM

## 2020-12-10 DIAGNOSIS — E876 Hypokalemia: Secondary | ICD-10-CM

## 2020-12-10 DIAGNOSIS — M67442 Ganglion, left hand: Secondary | ICD-10-CM | POA: Diagnosis not present

## 2020-12-10 DIAGNOSIS — E1169 Type 2 diabetes mellitus with other specified complication: Secondary | ICD-10-CM | POA: Diagnosis not present

## 2020-12-10 DIAGNOSIS — I480 Paroxysmal atrial fibrillation: Secondary | ICD-10-CM

## 2020-12-10 DIAGNOSIS — Z6841 Body Mass Index (BMI) 40.0 and over, adult: Secondary | ICD-10-CM

## 2020-12-10 DIAGNOSIS — F419 Anxiety disorder, unspecified: Secondary | ICD-10-CM | POA: Diagnosis not present

## 2020-12-10 LAB — LIPID PANEL
Cholesterol: 169 mg/dL (ref 0–200)
HDL: 47.4 mg/dL (ref >39.00)
LDL Cholesterol: 95 mg/dL (ref 0–99)
NonHDL: 121.18
Total CHOL/HDL Ratio: 4
Triglycerides: 131 mg/dL (ref 0.0–149.0)
VLDL: 26.2 mg/dL (ref 0.0–40.0)

## 2020-12-10 LAB — BASIC METABOLIC PANEL
BUN: 17 mg/dL (ref 6–23)
CO2: 31 mEq/L (ref 19–32)
Calcium: 10.2 mg/dL (ref 8.4–10.5)
Chloride: 103 mEq/L (ref 96–112)
Creatinine, Ser: 0.88 mg/dL (ref 0.40–1.20)
GFR: 68.02 mL/min (ref >60.00)
Glucose, Bld: 113 mg/dL — ABNORMAL HIGH (ref 70–99)
Potassium: 3.8 mEq/L (ref 3.5–5.1)
Sodium: 144 mEq/L (ref 135–145)

## 2020-12-10 LAB — IRON: Iron: 85 ug/dL (ref 42–145)

## 2020-12-10 LAB — CBC
HCT: 40.8 % (ref 36.0–46.0)
Hemoglobin: 13.5 g/dL (ref 12.0–15.0)
MCHC: 33.2 g/dL (ref 30.0–36.0)
MCV: 85.9 fl (ref 78.0–100.0)
Platelets: 249 10*3/uL (ref 150.0–400.0)
RBC: 4.75 Mil/uL (ref 3.87–5.11)
RDW: 15.7 % — ABNORMAL HIGH (ref 11.5–15.5)
WBC: 5.2 10*3/uL (ref 4.0–10.5)

## 2020-12-10 LAB — HEMOGLOBIN A1C: Hgb A1c MFr Bld: 6.6 % — ABNORMAL HIGH (ref 4.6–6.5)

## 2020-12-10 NOTE — Patient Instructions (Addendum)
A few things to remember from today's visit:  History of rectal bleeding - Plan: CBC, Iron  Type 2 diabetes mellitus with other specified complication, without long-term current use of insulin ([place]), Chronic - Plan: Hemoglobin A1c  Paroxysmal atrial fibrillation ([place]), Chronic  Morbid obesity with BMI of 45.0-49.9, adult ([facility]), Chronic  Essential hypertension - Plan: Basic metabolic panel  Hyperlipidemia associated with type 2 diabetes mellitus ([place]) - Plan: Lipid panel  If you need refills please call your pharmacy. Do not use My Chart to request refills or for acute issues that need immediate attention.   No changes today. You really need to improve sleep ,so you can take care of your mother. Decrease snacks at night and try to cook more.  Please be sure medication list is accurate. If a new problem present, please set up appointment sooner than planned today.

## 2020-12-10 NOTE — Progress Notes (Signed)
HPI: [name] is a 70 y.o. female, who is here today for 4 months follow up.   She was last seen on 08/11/20. No new problems sine her last visit.  -Anxiety: She is on Lorazepam 0.5 mg daily as needed. She does not take it frequently. She is her mother's caregiver, she is declining and admitted to hospice. She is not sleeping well. Her mother fell recently, rolled out of bed; so she does not sleep well checking on her through the night. Hospice home visits at any time during the day, so she cannot rest while her mother is taking naps.  She is "stress eating", has gained some wt, which aggravates back pain.  She is not exercising regularly.   -Left hand "knot", tender, a few weeks ago and getting bigger. No hx of trauma. Pain exacerbated by palpation and movement. Right handed.  -HTN: She is on HCTZ 25 mg daily,Atenolol 25 mg daily,and Amlodipine 5 mg daily. Negative for severe/frequent headache, visual changes, chest pain, dyspnea, palpitation focal weakness, or worse edema. Atrial fib: She is on Eliquis 5 mg bid. Echo 05/11/20: LVEF 60-65%. Follows with cardiologist.  -HypoK+ on KLOR 10 meq daily.  Lab Results  Component Value Date   CREATININE 0.95 07/23/2020   BUN 17 07/23/2020   NA 141 07/23/2020   K 3.9 07/23/2020   CL 100 07/23/2020   CO2 24 07/23/2020   -DM II: Dx'ed in 2018. Negative for polydipsia,polyuria, or polyphagia. She is on non pharmacologic treatment.  Lab Results  Component Value Date   HGBA1C 5.9 08/11/2020   -Rectal bleed, recurrent. Negative for dyschezia. Hemorrhoid bleeding daily with and w/o defecation. Reporting problem as stable. Hx of IBS. She is on chronic opioid medication, which aggravate constipation sometimes.  She has surgical consultation was done before she started Eliquis. She does not have a caregiver for her mother or someone to help her if she has surgery,so she declined surgical procedure. Colonoscopy  in 10/2018:  Hemorrhoids found on perianal exam. - Severe diverticulosis in the entire examined colon. - Anal papilla(e) were hypertrophied. - Internal hemorrhoids. - No specimens collected.  + Fatigue, she wonders if she needs to be on iron.  Lab Results  Component Value Date   WBC 5.8 07/23/2020   HGB 12.9 07/23/2020   HCT 38.2 07/23/2020   MCV 85 07/23/2020   PLT 248 07/23/2020   Review of Systems  Constitutional: Positive for fatigue. Negative for activity change, appetite change and fever.  HENT: Negative for mouth sores, nosebleeds and sore throat.   Respiratory: Negative for cough and wheezing.   Gastrointestinal: Negative for abdominal pain, nausea and vomiting.       Negative for changes in bowel habits.  Genitourinary: Negative for decreased urine volume, dysuria and hematuria.  Musculoskeletal: Positive for back pain. Negative for gait problem.  Neurological: Negative for syncope, facial asymmetry and weakness.  Hematological: Negative for adenopathy. Does not bruise/bleed easily.  Psychiatric/Behavioral: Positive for sleep disturbance. The patient is nervous/anxious.   Rest of ROS, see pertinent positives sand negatives in HPI  Current Outpatient Medications on File Prior to Visit  Medication Sig Dispense Refill  . amLODipine (NORVASC) 5 MG tablet Take 1 tablet by mouth once daily 90 tablet 2  . atenolol (TENORMIN) 25 MG tablet Take 1 tablet by mouth once daily 90 tablet 0  . azelastine (ASTELIN) 0.1 % nasal spray Place 2 sprays into both nostrils 2 (two) times daily. Use in each nostril  as directed 30 mL 11  . calcium carbonate (TUMS EX) 750 MG chewable tablet Chew 2 tablets by mouth daily as needed for heartburn.    . cetirizine (ZYRTEC) 10 MG tablet Take 10 mg by mouth daily. Alternating with Allegra    . cyclobenzaprine (FLEXERIL) 10 MG tablet Take 2.5 mg by mouth daily as needed (pain level 8 or greater).     Marland Kitchen diclofenac sodium (VOLTAREN) 1 % GEL APPLY (4G) BY  TOPICAL ROUTE 4 TIMES EVERY DAY TO THE AFFECTED AREA(S) (WC PA)  1  . ELIQUIS 5 MG TABS tablet Take 1 tablet by mouth twice daily 180 tablet 2  . fluticasone (FLONASE) 50 MCG/ACT nasal spray Use 2 spray(s) in each nostril once daily 48 g 0  . furosemide (LASIX) 20 MG tablet Take 1 tablet (20 mg total) by mouth daily as needed for edema (Please give our office a call if you take more than 2 days in a row. Please take an extra potassium tablet if taking this medication.). 30 tablet 11  . guaiFENesin (MUCINEX) 600 MG 12 hr tablet Take 300 mg by mouth as needed.     . hydrochlorothiazide (HYDRODIURIL) 25 MG tablet TAKE 1 TABLET BY MOUTH ONCE DAILY(DUE FOR FOLLOW UP) 90 tablet 3  . HYDROcodone-acetaminophen (NORCO/VICODIN) 5-325 MG tablet Take 0.5 tablets by mouth daily as needed (pain level 8 or greater). 30 tablet 0  . hydrocortisone 2.5 % ointment APPLY TWICE A DAY 60 g 11  . hydrOXYzine (ATARAX/VISTARIL) 25 MG tablet Take 1 tablet (25 mg total) by mouth daily as needed for itching. 30 tablet 0  . LORazepam (ATIVAN) 0.5 MG tablet Take 0.5-1 tablets (0.25-0.5 mg total) by mouth daily as needed for anxiety. 20 tablet 1  . Menthol-Camphor (ICY HOT ARTHRITIS PAIN RELIEF EX) Apply 1 application topically as needed.     . montelukast (SINGULAIR) 10 MG tablet Take 1 tablet (10 mg total) by mouth at bedtime. 30 tablet 1  . Multiple Vitamins-Minerals (MULTI-VITAMIN GUMMIES PO) Take by mouth as needed.     Marland Kitchen omeprazole (PRILOSEC) 40 MG capsule Take 1 capsule (40 mg total) by mouth daily. 60 capsule 11  . Polyethylene Glycol 3350 (MIRALAX PO) Take by mouth as needed.     . potassium chloride (KLOR-CON) 10 MEQ tablet Take 2 tablets by mouth once daily 180 tablet 2  . pravastatin (PRAVACHOL) 20 MG tablet Take by mouth twice a week 27 tablet 3  . trolamine salicylate (ASPERCREME) 10 % cream Apply 1 application topically as needed for muscle pain.     No current facility-administered medications on file prior to  visit.   Past Medical History:  Diagnosis Date  . Abdominal pain, epigastric 12/28/2008   Centricity Description: ABDOMINAL PAIN, EPIGASTRIC Qualifier: Diagnosis of  By: [name], [name]   Centricity Description: ABDOMINAL PAIN-EPIGASTRIC Qualifier: Diagnosis of  By: [name], [name]   . Allergic rhinitis 09/16/2013  . Allergy   . Anemia   . Anxiety   . Anxiety disorder, unspecified 07/06/2017  . Arthritis   . Blood in stool   . BMI 45.0-49.9, adult ([place]) 07/26/2016  . Class 3 obesity with serious comorbidity and body mass index (BMI) of 45.0 to 49.9 in adult 03/07/2017  . DDD (degenerative disc disease), lumbar   . DIVERTICULOSIS-COLON 12/28/2008   Qualifier: Diagnosis of  By: [name], [name]   . GERD 12/28/2008   Qualifier: Diagnosis of  By: [name], [name]   .  GERD (gastroesophageal reflux disease)   . Headache   . Hyperglycemia 03/17/2014  . Hypertension   . Hypokalemia 07/26/2016  . IBS (irritable bowel syndrome)-C-D 07/26/2016  . Obesity   . Other and unspecified hyperlipidemia 03/17/2014  . PERSONAL HX COLONIC POLYPS 12/28/2008   Qualifier: Diagnosis of  By: [name], [name]   . Reflux esophagitis 12/28/2008   Qualifier: Diagnosis of  By: Julaine Hua CMA (AAMA), [name]    . S/P lumbar spinal fusion 07/02/2012  . Tubulovillous adenoma of colon 2008   Allergies  Allergen Reactions  . Crestor [Rosuvastatin]   . Erythromycin     rash  . Lansoprazole   . Penicillins     rash  . Potassium-Containing Compounds Other (See Comments)    Stomach upset, can take pills, but not the packets   . Robaxin [Methocarbamol]     Messed up stomach  . Sulfonamide Derivatives     rash  . Trental [Pentoxifylline Er]     Bad headache   Social History   Socioeconomic History  . Marital status: Widowed    Spouse name: Not on file  . Number of children: Not on file  . Years of education: Not on file  . Highest education level: Not on file  Occupational History   . 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 Smoking status: Former Smoker    Packs/day: 1.00    Years: 38.00    Pack years: 38.00    Types: Cigarettes    Quit date: 10/28/1997    Years since quitting: 23.1  . Smokeless tobacco: Never Used  Vaping Use  . Vaping Use: Never used  Substance and Sexual Activity  . Alcohol use: No  . Drug use: No  . Sexual activity: Never  Other Topics Concern  . 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   Social Determinants of Health   Financial Resource Strain: Low Risk   . Difficulty of Paying Living Expenses: Not hard at all  Food Insecurity: No Food Insecurity  . Worried About Charity fundraiser in the Last Year: Never true  . Ran Out of Food in the Last Year: Never true  Transportation Needs: No Transportation Needs  . Lack of Transportation (Medical): No  . Lack of Transportation (Non-Medical): No  Physical Activity: Inactive  . Days of Exercise per Week: 0 days  . Minutes of Exercise per Session: 0 min  Stress: No Stress Concern Present  . Feeling of Stress : Only a little  Social Connections: Moderately Isolated  . Frequency of Communication with Friends and Family: More than three times a week  . Frequency of Social Gatherings with Friends and Family: Never  . Attends Religious Services: 1 to 4 times per year  . Active Member of Clubs or Organizations: No  . Attends Archivist Meetings: Never  . Marital Status: Widowed   Vitals:   12/10/20 0856  BP: 130/80  Pulse: 100  Resp: 16  Temp: 97.9 F (36.6 C)  SpO2: 98%   Wt Readings from Last 3 Encounters:  12/10/20 248 lb (112.5 kg)  08/11/20 240 lb 2 oz (108.9 kg)  07/23/20 264 lb (119.7 kg)    Body mass index is 46.86 kg/m.  Physical Exam Vitals and nursing note reviewed.  Constitutional:      General: She is not in acute distress.    Appearance: She is well-developed.  HENT:  Head:  Normocephalic and atraumatic.     Mouth/Throat:     Mouth: Oropharynx is clear and moist and mucous membranes are normal.  Eyes:     Conjunctiva/sclera: Conjunctivae normal.     Pupils: Pupils are equal, round, and reactive to light.  Cardiovascular:     Rate and Rhythm: Normal rate. Rhythm irregular.     Pulses:          Dorsalis pedis pulses are 2+ on the right side and 2+ on the left side.     Heart sounds: No murmur heard.     Comments: HR 86/min Pulmonary:     Effort: Pulmonary effort is normal. No respiratory distress.     Breath sounds: Normal breath sounds.  Abdominal:     Palpations: Abdomen is soft. There is no hepatomegaly or mass.     Tenderness: There is no abdominal tenderness.  Musculoskeletal:       Hands:     Right lower leg: 1+ Pitting Edema present.     Left lower leg: 1+ Pitting Edema present.  Lymphadenopathy:     Cervical: No cervical adenopathy.  Skin:    General: Skin is warm.     Findings: No erythema or rash.  Neurological:     Mental Status: She is alert and oriented to person, place, and time.     Cranial Nerves: No cranial nerve deficit.     Deep Tendon Reflexes: Strength normal.     Comments: Antalgic gait, not assisted.  Psychiatric:        Mood and Affect: Mood and affect normal.     Comments: Well groomed, good eye contact.    Diabetic Foot Exam - Simple   Simple Foot Form Diabetic Foot exam was performed with the following findings: Yes 12/10/2020  4:39 PM  Visual Inspection See comments: Yes Sensation Testing Intact to touch and monofilament testing bilaterally: Yes Pulse Check Posterior Tibialis and Dorsalis pulse intact bilaterally: Yes Comments Tender calluses x 2, left plantar.    ASSESSMENT AND PLAN:  [name] was seen today for 4 months follow-up.  Orders Placed This Encounter  Procedures  . Hemoglobin A1c  . Basic metabolic panel  . Lipid panel  . CBC  . Iron   Lab Results  Component Value Date    CREATININE 0.88 12/10/2020   BUN 17 12/10/2020   NA 144 12/10/2020   K 3.8 12/10/2020   CL 103 12/10/2020   CO2 31 12/10/2020   Lab Results  Component Value Date   CHOL 169 12/10/2020   HDL 47.40 12/10/2020   LDLCALC 95 12/10/2020   TRIG 131.0 12/10/2020   CHOLHDL 4 12/10/2020   Lab Results  Component Value Date   WBC 5.2 12/10/2020   HGB 13.5 12/10/2020   HCT 40.8 12/10/2020   MCV 85.9 12/10/2020   PLT 249.0 12/10/2020   Lab Results  Component Value Date   HGBA1C 6.6 (H) 12/10/2020    Type 2 diabetes mellitus with other specified complication, without long-term current use of insulin ([place]) HgA1C has been at goal. Continue non pharmacologic treatment. Regular exercise and healthy diet with avoidance of added sugar food intake is an important part of treatment and recommended. Annual eye exam, periodic dental and foot care recommended. F/U in 5-6 months  Paroxysmal atrial fibrillation (HCC) Not rhythm controlled today. Continue Eliquis 5 mg bid and Atenolol 25 mg daily. Following with cardiologist.  Morbid obesity with BMI of 45.0-49.9, adult [facility]) She understands  the benefits of wt loss as well as adverse effects of obesity. Consistency with healthy diet and physical activity recommended.  History of rectal bleeding She has had colonoscopy and surgical evaluation, stable. Try to avoid straining,constipation,and prolonged toilet time. She is not interested in surgical treatment at this time. Instructed about warning signs.  Essential hypertension BP adequately controlled. Continue same dose of HCTZ,Atenolol,or Amlodipine. Low salt diet recommended.  Hyperlipidemia associated with type 2 diabetes mellitus (HCC) Continue Pravastatin 20 mg daily. Further recommendations according to FLP result.  Anxiety disorder, unspecified type Exacerbated by lack of sleep and mother's illness. No changes in Lorazepam dose.  Hypokalemia Continue KLOR 10 meq  daily. Further recommendations according to BMP result.  Ganglion, left hand We discussed Dx's,prognosis,and treatment options. She prefers to hold on hand specialist evaluation. She follows with ortho for back pain, she could address problem with provider during her next appt.  Spent 50 minutes.  During this time history was obtained and documented, examination was performed, prior labs/imaging reviewed, and assessment/plan discussed.  Return in about 5 months (around 05/12/2021) for cpe.  Qasim Diveley G. [name], MD  [facility]. [place] office.   A few things to remember from today's visit:  History of rectal bleeding - Plan: CBC, Iron  Type 2 diabetes mellitus with other specified complication, without long-term current use of insulin ([place]), Chronic - Plan: Hemoglobin A1c  Paroxysmal atrial fibrillation ([place]), Chronic  Morbid obesity with BMI of 45.0-49.9, adult (HCC), Chronic  Essential hypertension - Plan: Basic metabolic panel  Hyperlipidemia associated with type 2 diabetes mellitus ([place]) - Plan: Lipid panel  If you need refills please call your pharmacy. Do not use My Chart to request refills or for acute issues that need immediate attention.   No changes today. You really need to improve sleep ,so you can take care of your mother. Decrease snacks at night and try to cook more.  Please be sure medication list is accurate. If a new problem present, please set up appointment sooner than planned today.

## 2021-02-09 NOTE — Progress Notes (Signed)
Chief Complaint  Patient presents with  . Follow-up    HPI: [name] is a 70 y.o. female, who is here today to follow on some of her chronic medical problems.She would like to follow on DM II and renal function.  She was last seen on 12/10/20, when labs were done. Since her last visit she has seen her chronic pain management.  DM II: Dx'ed in 2018. She is not checking BS's. Negative for polydipsia,polyuria, or polyphagia. She is on non pharmacologic treatment.  Lab Results  Component Value Date   HGBA1C 6.6 (H) 12/10/2020   Reporting "some CP",intermittently for a week. She can localized pain, left anterior aspect of shoulder and sometimes left rib cage. No associated SOB,plapitations,or diaphoresis. Exacerbated by movement, leaning forward. No hx of trauma. She does some lifting when helping her mother with ADL's.  Atrial fib, she is on Eliquis 5 mg bid. She has not seen her cardiologist. Concerned about renal function. She has not noted gross hematuria,foam in urine,decreased urine output,or worsening LE edema. HTN on HCTZ 25 mg,Atenolol 25 mg daily,and Amlodipine 5 mg daily.  Lab Results  Component Value Date   CREATININE 0.88 12/10/2020   BUN 17 12/10/2020   NA 144 12/10/2020   K 3.8 12/10/2020   CL 103 12/10/2020   CO2 31 12/10/2020   Sometimes her urine looks like "chocolate", it improves if she increases fluid intake. Urine incontinence with sneezing and cough, urgency. Problem has been going on for years. Lower abdominal pressure with urination. No dysuria.  Fecal incontinence. States that surgical procedure was offered but it will take 9 weeks to recover + there is a 50% probability or recurrence. 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 Problem causes perianal irritation.  Constipation:linzess caused cramps and did not help much with problem She is taking Miralax, helps some. Lower abdominal  pain,intermittent, not radiated. Alleviated by defecation. Occasional dyschezia, stable for years. On Chronic opioid treatment.  Negative for new symptoms. Colonoscopy 10/16/18. Abdominal/pelvic CT on 11/08/18: 1. No acute findings. 2. Severe diffuse colonic diverticulosis. No radiographic evidence of diverticulitis.  Review of Systems  Constitutional: Positive for fatigue. Negative for activity change, appetite change and fever.  HENT: Negative for mouth sores, nosebleeds and sore throat.   Eyes: Negative for redness and visual disturbance.  Respiratory: Negative for cough and wheezing.   Gastrointestinal: Negative for nausea and vomiting.       Negative for changes in bowel habits.  Neurological: Negative for syncope, weakness and headaches.  Psychiatric/Behavioral: Negative for confusion. The patient is nervous/anxious.   Rest of ROS, see pertinent positives sand negatives in HPI  Current Outpatient Medications on File Prior to Visit  Medication Sig Dispense Refill  . amLODipine (NORVASC) 5 MG tablet Take 1 tablet by mouth once daily 90 tablet 2  . atenolol (TENORMIN) 25 MG tablet Take 1 tablet by mouth once daily 90 tablet 0  . azelastine (ASTELIN) 0.1 % nasal spray Place 2 sprays into both nostrils 2 (two) times daily. Use in each nostril as directed 30 mL 11  . calcium carbonate (TUMS EX) 750 MG chewable tablet Chew 2 tablets by mouth daily as needed for heartburn.    . cetirizine (ZYRTEC) 10 MG tablet Take 10 mg by mouth daily. Alternating with Allegra    . cyclobenzaprine (FLEXERIL) 10 MG tablet Take 2.5 mg by mouth daily as needed (pain level 8 or greater).     Marland Kitchen  diclofenac sodium (VOLTAREN) 1 % GEL APPLY (4G) BY TOPICAL ROUTE 4 TIMES EVERY DAY TO THE AFFECTED AREA(S) (WC PA)  1  . ELIQUIS 5 MG TABS tablet Take 1 tablet by mouth twice daily 180 tablet 2  . fluticasone (FLONASE) 50 MCG/ACT nasal spray Use 2 spray(s) in each nostril once daily 48 g 0  . guaiFENesin (MUCINEX) 600  MG 12 hr tablet Take 300 mg by mouth as needed.     . hydrochlorothiazide (HYDRODIURIL) 25 MG tablet TAKE 1 TABLET BY MOUTH ONCE DAILY(DUE FOR FOLLOW UP) 90 tablet 3  . HYDROcodone-acetaminophen (NORCO/VICODIN) 5-325 MG tablet Take 0.5 tablets by mouth daily as needed (pain level 8 or greater). 30 tablet 0  . hydrocortisone 2.5 % ointment APPLY TWICE A DAY 60 g 11  . hydrOXYzine (ATARAX/VISTARIL) 25 MG tablet Take 1 tablet (25 mg total) by mouth daily as needed for itching. 30 tablet 0  . LORazepam (ATIVAN) 0.5 MG tablet Take 0.5-1 tablets (0.25-0.5 mg total) by mouth daily as needed for anxiety. 20 tablet 1  . Menthol-Camphor (ICY HOT ARTHRITIS PAIN RELIEF EX) Apply 1 application topically as needed.     . Multiple Vitamins-Minerals (MULTI-VITAMIN GUMMIES PO) Take by mouth as needed.     Marland Kitchen omeprazole (PRILOSEC) 40 MG capsule Take 1 capsule (40 mg total) by mouth daily. 60 capsule 11  . Polyethylene Glycol 3350 (MIRALAX PO) Take by mouth as needed.     . potassium chloride (KLOR-CON) 10 MEQ tablet Take 2 tablets by mouth once daily 180 tablet 2  . pravastatin (PRAVACHOL) 20 MG tablet Take by mouth twice a week 27 tablet 3  . trolamine salicylate (ASPERCREME) 10 % cream Apply 1 application topically as needed for muscle pain.     No current facility-administered medications on file prior to visit.   Past Medical History:  Diagnosis Date  . Abdominal pain, epigastric 12/28/2008   Centricity Description: ABDOMINAL PAIN, EPIGASTRIC Qualifier: Diagnosis of  By: [name], [name]   Centricity Description: ABDOMINAL PAIN-EPIGASTRIC Qualifier: Diagnosis of  By: [name], [name]   . Allergic rhinitis 09/16/2013  . Allergy   . Anemia   . Anxiety   . Anxiety disorder, unspecified 07/06/2017  . Arthritis   . Blood in stool   . BMI 45.0-49.9, adult ([place]) 07/26/2016  . Class 3 obesity with serious comorbidity and body mass index (BMI) of 45.0 to 49.9 in adult 03/07/2017  . DDD (degenerative  disc disease), lumbar   . DIVERTICULOSIS-COLON 12/28/2008   Qualifier: Diagnosis of  By: [name], [name]   . GERD 12/28/2008   Qualifier: Diagnosis of  By: [name], [name]   . GERD (gastroesophageal reflux disease)   . Headache   . Hyperglycemia 03/17/2014  . Hypertension   . Hypokalemia 07/26/2016  . IBS (irritable bowel syndrome)-C-D 07/26/2016  . Obesity   . Other and unspecified hyperlipidemia 03/17/2014  . PERSONAL HX COLONIC POLYPS 12/28/2008   Qualifier: Diagnosis of  By: [name], [name]   . Reflux esophagitis 12/28/2008   Qualifier: Diagnosis of  By: Julaine Hua CMA (AAMA), [name]    . S/P lumbar spinal fusion 07/02/2012  . Tubulovillous adenoma of colon 2008   Allergies  Allergen Reactions  . Crestor [Rosuvastatin]   . Erythromycin     rash  . Lansoprazole   . Penicillins     rash  . Potassium-Containing Compounds Other (See Comments)    Stomach upset, can take pills, but not the  packets   . Robaxin [Methocarbamol]     Messed up stomach  . Sulfonamide Derivatives     rash  . Trental [Pentoxifylline Er]     Bad headache    Social History   Socioeconomic History  . Marital status: Widowed    Spouse name: Not on file  . Number of children: Not on file  . Years of education: Not on file  . Highest education level: Not on file  Occupational History  . 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 Smoking status: Former Smoker    Packs/day: 1.00    Years: 38.00    Pack years: 38.00    Types: Cigarettes    Quit date: 10/28/1997    Years since quitting: 23.3  . Smokeless tobacco: Never Used  Vaping Use  . Vaping Use: Never used  Substance and Sexual Activity  . Alcohol use: No  . Drug use: No  . Sexual activity: Never  Other Topics Concern  . 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   Social Determinants of Health   Financial Resource Strain: Low Risk   . Difficulty of Paying Living  Expenses: Not hard at all  Food Insecurity: No Food Insecurity  . Worried About Charity fundraiser in the Last Year: Never true  . Ran Out of Food in the Last Year: Never true  Transportation Needs: No Transportation Needs  . Lack of Transportation (Medical): No  . Lack of Transportation (Non-Medical): No  Physical Activity: Inactive  . Days of Exercise per Week: 0 days  . Minutes of Exercise per Session: 0 min  Stress: No Stress Concern Present  . Feeling of Stress : Only a little  Social Connections: Moderately Isolated  . Frequency of Communication with Friends and Family: More than three times a week  . Frequency of Social Gatherings with Friends and Family: Never  . Attends Religious Services: 1 to 4 times per year  . Active Member of Clubs or Organizations: No  . Attends Archivist Meetings: Never  . Marital Status: Widowed    Vitals:   02/10/21 0716  BP: 130/70  Pulse: 69  Resp: 16  SpO2: 98%   Body mass index is 46.53 kg/m.  Physical Exam Vitals and nursing note reviewed.  Constitutional:      General: She is not in acute distress.    Appearance: She is well-developed.  HENT:     Head: Normocephalic and atraumatic.     Mouth/Throat:     Mouth: Mucous membranes are moist.     Pharynx: Oropharynx is clear.  Eyes:     Conjunctiva/sclera: Conjunctivae normal.  Cardiovascular:     Rate and Rhythm: Normal rate. Rhythm irregular.     Heart sounds: No murmur heard.   Pulmonary:     Effort: Pulmonary effort is normal. No respiratory distress.     Breath sounds: Normal breath sounds.  Chest:     Chest wall: Tenderness present.    Abdominal:     Palpations: Abdomen is soft. There is no hepatomegaly or mass.     Tenderness: There is no abdominal tenderness.  Musculoskeletal:     Right lower leg: 1+ Pitting Edema present.     Left lower leg: 1+ Pitting Edema present.  Lymphadenopathy:     Cervical: No cervical adenopathy.  Skin:    General: Skin  is warm.     Findings: No erythema  or rash.  Neurological:     Mental Status: She is alert and oriented to person, place, and time.     Cranial Nerves: No cranial nerve deficit.     Comments: Antalgic gait,not assisted.  Psychiatric:        Mood and Affect: Mood is anxious.     Comments: Well groomed, good eye contact.   ASSESSMENT AND PLAN:  [name] was seen today for  follow-up.  Orders Placed This Encounter  Procedures  . Urinalysis, Routine w reflex microscopic    Dark brown urine We discussed possible etiologies, ? Dehydration,hematruia among some. Adequate hydration is important. Further recommendations according to UA.  Mixed incontinence urge and stress She is not interested in pharmacologic treatment. Kegel and pelvic floor exercises will help, she prefers to do it at home and hold on PT referral.  Type 2 diabetes mellitus with other specified complication, without long-term current use of insulin ([place]) Problem has been well controlled on non pharmacologic treatment. HgA1C was just done in 12/2020,so not yet due for another one.  Essential hypertension Problem is adequately controlled. We discussed possible complications of elevated BP (as well as glucose),including CKD. Low salt diet recommended. Monitor BP at home. Avoid NSAID's. Will plan on checking BMP next visit.  Return for Please change next appt for CPE an f/u.   Delmont Prosch G. [name], MD  [facility]. [place] office.   A few things to remember from today's visit:   Type 2 diabetes mellitus with other specified complication, without long-term current use of insulin (HCC)  Essential hypertension  Dark brown urine - Plan: Urinalysis, Routine w reflex microscopic  Mixed incontinence urge and stress  If you need refills please call your pharmacy. Do not use My Chart to request refills or for acute issues that need immediate attention.   Continue Miralax daily at  bedtime. Add Bisacodyl 5 mg daily as needed.  This exercise helps with mild urine leakage associated with cough, laughing, or sneezing. It may help with other types of urine incontinence and even with mild fecal incontinence.  Tighten and relax the pelvic muscles intermittently during the day. Once you are familiar with exercise try to hold pelvic muscles contraction for about 8-10 seconds.in the beginning you may not be able to hold contraction for more than a second or 2 but eventually you will be able to hold contraction harder and for longer time. Perform  8-12 exercises 3 times per day and daily for 15-20 weeks. You will need to continue exercises indefinitely to have a lasting effect.  Desitin peri rectal may help with irritation.    Please be sure medication list is accurate. If a new problem present, please set up appointment sooner than planned today.

## 2021-02-10 ENCOUNTER — Ambulatory Visit: Payer: Medicare PPO | Admitting: Family Medicine

## 2021-02-10 ENCOUNTER — Encounter: Payer: Self-pay | Admitting: Family Medicine

## 2021-02-10 ENCOUNTER — Other Ambulatory Visit: Payer: Self-pay

## 2021-02-10 VITALS — BP 130/70 | HR 69 | Resp 16 | Ht 61.0 in | Wt 246.2 lb

## 2021-02-10 DIAGNOSIS — I10 Essential (primary) hypertension: Secondary | ICD-10-CM

## 2021-02-10 DIAGNOSIS — E1169 Type 2 diabetes mellitus with other specified complication: Secondary | ICD-10-CM

## 2021-02-10 DIAGNOSIS — N3946 Mixed incontinence: Secondary | ICD-10-CM | POA: Diagnosis not present

## 2021-02-10 DIAGNOSIS — R82998 Other abnormal findings in urine: Secondary | ICD-10-CM

## 2021-02-10 LAB — URINALYSIS, ROUTINE W REFLEX MICROSCOPIC
Bilirubin Urine: NEGATIVE
Hgb urine dipstick: NEGATIVE
Ketones, ur: NEGATIVE
Leukocytes,Ua: NEGATIVE
Nitrite: NEGATIVE
RBC / HPF: NONE SEEN (ref 0–?)
Specific Gravity, Urine: 1.02 (ref 1.000–1.030)
Total Protein, Urine: NEGATIVE
Urine Glucose: NEGATIVE
Urobilinogen, UA: 0.2 (ref 0.0–1.0)
WBC, UA: NONE SEEN (ref 0–?)
pH: 6.5 (ref 5.0–8.0)

## 2021-02-10 NOTE — Patient Instructions (Addendum)
A few things to remember from today's visit:   Type 2 diabetes mellitus with other specified complication, without long-term current use of insulin (HCC)  Essential hypertension  Dark brown urine - Plan: Urinalysis, Routine w reflex microscopic  Mixed incontinence urge and stress  If you need refills please call your pharmacy. Do not use My Chart to request refills or for acute issues that need immediate attention.   Continue Miralax daily at bedtime. Add Bisacodyl 5 mg daily as needed.  This exercise helps with mild urine leakage associated with cough, laughing, or sneezing. It may help with other types of urine incontinence and even with mild fecal incontinence.  Tighten and relax the pelvic muscles intermittently during the day. Once you are familiar with exercise try to hold pelvic muscles contraction for about 8-10 seconds.in the beginning you may not be able to hold contraction for more than a second or 2 but eventually you will be able to hold contraction harder and for longer time. Perform  8-12 exercises 3 times per day and daily for 15-20 weeks. You will need to continue exercises indefinitely to have a lasting effect.  Desitin peri rectal may help with irritation.    Please be sure medication list is accurate. If a new problem present, please set up appointment sooner than planned today.

## 2021-02-18 ENCOUNTER — Encounter: Payer: Self-pay | Admitting: Family Medicine

## 2021-03-02 ENCOUNTER — Other Ambulatory Visit: Payer: Self-pay | Admitting: Family Medicine

## 2021-03-02 DIAGNOSIS — I10 Essential (primary) hypertension: Secondary | ICD-10-CM

## 2021-05-05 ENCOUNTER — Encounter: Payer: Self-pay | Admitting: Family Medicine

## 2021-05-05 DIAGNOSIS — J309 Allergic rhinitis, unspecified: Secondary | ICD-10-CM | POA: Diagnosis not present

## 2021-05-05 DIAGNOSIS — E1142 Type 2 diabetes mellitus with diabetic polyneuropathy: Secondary | ICD-10-CM | POA: Diagnosis not present

## 2021-05-05 DIAGNOSIS — Z6841 Body Mass Index (BMI) 40.0 and over, adult: Secondary | ICD-10-CM | POA: Diagnosis not present

## 2021-05-05 DIAGNOSIS — G8929 Other chronic pain: Secondary | ICD-10-CM | POA: Diagnosis not present

## 2021-05-05 DIAGNOSIS — I10 Essential (primary) hypertension: Secondary | ICD-10-CM | POA: Diagnosis not present

## 2021-05-05 DIAGNOSIS — I4891 Unspecified atrial fibrillation: Secondary | ICD-10-CM | POA: Diagnosis not present

## 2021-05-05 DIAGNOSIS — D6869 Other thrombophilia: Secondary | ICD-10-CM | POA: Diagnosis not present

## 2021-05-05 DIAGNOSIS — E785 Hyperlipidemia, unspecified: Secondary | ICD-10-CM | POA: Diagnosis not present

## 2021-05-10 ENCOUNTER — Encounter: Payer: Self-pay | Admitting: Cardiovascular Disease

## 2021-05-11 ENCOUNTER — Encounter: Payer: Self-pay | Admitting: Cardiovascular Disease

## 2021-05-11 ENCOUNTER — Other Ambulatory Visit: Payer: Self-pay

## 2021-05-11 ENCOUNTER — Ambulatory Visit: Payer: Medicare PPO | Admitting: Cardiovascular Disease

## 2021-05-11 VITALS — BP 136/72 | HR 110 | Ht 61.0 in | Wt 248.4 lb

## 2021-05-11 DIAGNOSIS — I10 Essential (primary) hypertension: Secondary | ICD-10-CM

## 2021-05-11 DIAGNOSIS — I4819 Other persistent atrial fibrillation: Secondary | ICD-10-CM | POA: Diagnosis not present

## 2021-05-11 MED ORDER — METOPROLOL TARTRATE 25 MG PO TABS: 25.0000 mg | ORAL_TABLET | Freq: Two times a day (BID) | ORAL | 3 refills | Status: DC

## 2021-05-11 NOTE — Progress Notes (Signed)
Cardiology Office Note:    Date:  05/11/2021   ID:  [name], [name] 1951-01-09, MRN [id number]  PCP:  [name], [name], MD  Cardiologist:  Mertie Moores, MD    Referring MD: [name], [name], MD   Chief Complaint  Patient presents with   Hypertension   Atrial Fibrillation   Problem list 1.  Sinus bradycardia 2.  Essential hypertension  10/16/17    [name] is a 70 y.o. female with a hx of  HTN. She has been on Atenolol - dose has been gradually decreased due to sinus bradycardia .  She was started on Lotrel ( amlodipine - benazepril  10-20 mg a day )  The Lotrel has caused her legs to swell and has pain in her bladder when she urinates.   Has also developed chills since she has been taking the Lotrel .   Has never had any syncope.   Has occasional "swimmy headedness"  and orthostasis .   Family hx of MI and strokes in her family   She eats fast foods and prepared foods 3-4 times a week. Walks every other day  - 1/4 mile a day   She fell on Dec. 11 ( was pushing her mother up a wheelchair ramp and fell in the ice )   December 05, 2017: [name] was seen today for follow-up of her high blood pressure.  Her blood pressure has been much better only. Is eating better,  Is sweating lots.    Was told to drink Gatorade several times a week.  Wt is 244 .   Has lost 9 lbs   Has not working out regularly  Has had some ortho  issues  Is eating out much less.   April 21, 2020:  Seen back after ~ 2 year absence  Has no energy. Had palpitations last night - lasted a few minutes.  Was in the bed so no CP , no dyspnea Has lost 4 lbs this past week .  Labs from her primary MD were reveiwed and look ok  Takes a teaspoon of salt 1-2 times a week for leg cramps.   She is now on some leg cramp medicine .  Was advised not to eat salt.  She eats fast foods 3 times a week,  [employer], [employer], [employer], cook out  [employer] several times a week Eats bologna,  sausages  Aug. 3, 2022:  [name] is seen for follow up of her Afib  Is in Afib today , in on Eliquis   BP is well controlled.  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  She is not interested in starting anything else at this time .     Past Medical History:  Diagnosis Date   Abdominal pain, epigastric 12/28/2008   Centricity Description: ABDOMINAL PAIN, EPIGASTRIC Qualifier: Diagnosis of  By: [name], [name]   Centricity Description: ABDOMINAL PAIN-EPIGASTRIC Qualifier: Diagnosis of  By: [name], [name]    Allergic rhinitis 09/16/2013   Allergy    Anemia    Anxiety    Anxiety disorder, unspecified 07/06/2017   Arthritis    Blood in stool    BMI 45.0-49.9, adult ([place]) 07/26/2016   Class 3 obesity with  serious comorbidity and body mass index (BMI) of 45.0 to 49.9 in adult 03/07/2017   DDD (degenerative disc disease), lumbar    DIVERTICULOSIS-COLON 12/28/2008   Qualifier: Diagnosis of  By: [name], [name]    GERD 12/28/2008   Qualifier: Diagnosis of  By: [name], [name]    GERD (gastroesophageal reflux disease)    Headache    Hyperglycemia 03/17/2014   Hypertension    Hypokalemia 07/26/2016   IBS (irritable bowel syndrome)-C-D 07/26/2016   Obesity    Other and unspecified hyperlipidemia 03/17/2014   PERSONAL HX COLONIC POLYPS 12/28/2008   Qualifier: Diagnosis of  By: [name], [name]    Reflux esophagitis 12/28/2008   Qualifier: Diagnosis of  By: [name], [name]     S/P lumbar spinal fusion 07/02/2012   Tubulovillous adenoma of colon 2008    Past Surgical History:  Procedure Laterality Date   ABDOMINAL HYSTERECTOMY     COLONOSCOPY  2014   laser surgery to remove scar     not sure of the year; after hyst   RADIOLOGY WITH  ANESTHESIA N/A 02/25/2015   Procedure: MRI OF LUMBAR SPINE;  Surgeon: Medication Radiologist, MD;  Location: [facility];  Service: Radiology;  Laterality: N/A;  DR. Bennie Pierini COHEN/MRI   SPINE SURGERY     TONSILLECTOMY AND ADENOIDECTOMY      Current Medications: Current Meds  Medication Sig   amLODipine (NORVASC) 5 MG tablet Take 1 tablet by mouth once daily   azelastine (ASTELIN) 0.1 % nasal spray Place 2 sprays into both nostrils 2 (two) times daily. Use in each nostril as directed   calcium carbonate (TUMS EX) 750 MG chewable tablet Chew 2 tablets by mouth daily as needed for heartburn.   cetirizine (ZYRTEC) 10 MG tablet Take 10 mg by mouth daily. Alternating with Allegra   cyclobenzaprine (FLEXERIL) 10 MG tablet Take 2.5 mg by mouth daily as needed (pain level 8 or greater).    diclofenac sodium (VOLTAREN) 1 % GEL APPLY (4G) BY TOPICAL ROUTE 4 TIMES EVERY DAY TO THE AFFECTED AREA(S) (WC PA)   ELIQUIS 5 MG TABS tablet Take 1 tablet by mouth twice daily   fluticasone (FLONASE) 50 MCG/ACT nasal spray Use 2 spray(s) in each nostril once daily   guaiFENesin (MUCINEX) 600 MG 12 hr tablet Take 300 mg by mouth as needed.    hydrochlorothiazide (HYDRODIURIL) 25 MG tablet TAKE 1 TABLET BY MOUTH ONCE DAILY(DUE FOR FOLLOW UP)   HYDROcodone-acetaminophen (NORCO/VICODIN) 5-325 MG tablet Take 0.5 tablets by mouth daily as needed (pain level 8 or greater).   hydrocortisone 2.5 % ointment APPLY TWICE A DAY   hydrOXYzine (ATARAX/VISTARIL) 25 MG tablet Take 1 tablet (25 mg total) by mouth daily as needed for itching.   Menthol-Camphor (ICY HOT ARTHRITIS PAIN RELIEF EX) Apply 1 application topically as needed.    metoprolol tartrate (LOPRESSOR) 25 MG tablet Take 1 tablet (25 mg total) by mouth 2 (two) times daily.   omeprazole (PRILOSEC) 40 MG capsule Take 1 capsule (40 mg total) by mouth daily.   potassium chloride (KLOR-CON) 10 MEQ tablet Take 2 tablets by mouth once daily   pravastatin (PRAVACHOL) 20 MG tablet Take  by mouth twice a week   trolamine salicylate (ASPERCREME) 10 % cream Apply 1 application topically as needed for muscle pain.   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   Social Determinants of Health   Financial Resource Strain: Low Risk    Difficulty of Paying Living Expenses: Not hard at all  Food Insecurity: No Food Insecurity   Worried About Charity fundraiser in the Last Year: Never true   [place] in the Last Year: Never true  Transportation Needs: No Transportation Needs   Lack of Transportation (Medical): No   Lack of Transportation (Non-Medical): No  Physical Activity: Inactive   Days of Exercise per Week: 0 days   Minutes of Exercise per Session: 0 min  Stress: No Stress Concern Present   Feeling of Stress : Only a little  Social Connections: Moderately Isolated   Frequency of Communication with Friends and Family: More than three times a week   Frequency of Social Gatherings with Friends and Family: Never   Attends Religious  Services: 1 to 4 times per year   Active Member of [employer] or Organizations: No   Attends Archivist Meetings: Never   Marital Status: Widowed     Family History: The patient's family history includes Arthritis in her father; Breast cancer in her cousin and paternal aunt; Cancer in her maternal grandfather; Cervical cancer in her sister; Diabetes in her sister; Heart disease in her sister; Hyperlipidemia in her father, mother, and sister; Hypertension in her father, mother, and paternal grandmother; Pancreatic cancer in her maternal aunt.  ROS:   As per current hx. Otherwise negatve    EKGs/Labs/Other Studies Reviewed:    The following studies were reviewed today:    Recent Labs: 12/10/2020: BUN 17; Creatinine, Ser 0.88; Hemoglobin 13.5; Platelets 249.0; Potassium 3.8; Sodium 144  Recent Lipid Panel    Component Value Date/Time   CHOL 169 12/10/2020 1020   TRIG 131.0 12/10/2020 1020   HDL 47.40 12/10/2020 1020   CHOLHDL 4 12/10/2020 1020   VLDL 26.2 12/10/2020 1020   LDLCALC 95 12/10/2020 1020   Physical Exam: Blood pressure 136/72, pulse (!) 110, height '5\' 1"'$  (1.549 m), weight 248 lb 6.4 oz (112.7 kg), SpO2 97 %.  GEN:  Well nourished, well developed in no acute distress HEENT: Normal NECK: No JVD; No carotid bruits LYMPHATICS: No lymphadenopathy CARDIAC: irreg. Irreg.  RESPIRATORY:  Clear to auscultation without rales, wheezing or rhonchi  ABDOMEN: Soft, non-tender, non-distended MUSCULOSKELETAL:  No edema; No deformity  SKIN: Warm and dry NEUROLOGIC:  Alert and oriented x 3    EKG   Aug. 3, 2022:  atrial fib with RVR , 110 , NS ST abn.  ASSESSMENT:    1. Persistent atrial fibrillation ([place])   2. Essential hypertension   3. Morbid obesity ([place])     PLAN:    1.  Atrial fib:    I have encouraged her to make sure that she takes her Eliquis twice a day.  She occasionally misses a dose because she gets too busy taking care of her mother.  We wil stop  her atenolol and start her on metoprolol 25 mg twice a day which should control her heart rate better.  2.  Essential hypertension:    advised better diet, weight loss    3.  Obesity:    Advised better diet , exercise and wt loss   4.  Fatigue:   does not sleep well,  takes care of her mother  - 24 hours a day ,  does not exercise    Medication Adjustments/Labs and Tests Ordered: Current medicines are reviewed at length with the patient today.  Concerns regarding medicines are outlined above.  Orders Placed This Encounter  Procedures   EKG 12-Lead    Meds ordered this encounter  Medications   metoprolol tartrate (LOPRESSOR) 25 MG tablet    Sig: Take 1 tablet (25 mg total) by mouth 2 (two) times daily.    Dispense:  180 tablet    Refill:  3     Signed, Mertie Moores, MD  05/11/2021 6:34 PM    [facility]

## 2021-05-11 NOTE — Patient Instructions (Signed)
Medication Instructions:  Your physician has recommended you make the following change in your medication:   START Metoprolol tartrate (Lopressor) 25 mg twice daily STOP taking Atenolol  *If you need a refill on your cardiac medications before your next appointment, please call your pharmacy*   Lab Work: None Ordered If you have labs (blood work) drawn today and your tests are completely normal, you will receive your results only by: [place] (if you have MyChart) OR A paper copy in the mail If you have any lab test that is abnormal or we need to change your treatment, we will call you to review the results.   Testing/Procedures: None Ordered   Follow-Up: At [facility], you and your health needs are our priority.  As part of our continuing mission to provide you with exceptional heart care, we have created designated Provider Care Teams.  These Care Teams include your primary Cardiologist (physician) and Advanced Practice Providers (APPs -  Physician Assistants and Nurse Practitioners) who all work together to provide you with the care you need, when you need it.   Your next appointment:   6 month(s)  The format for your next appointment:   In Person  Provider:   You will see one of the following Advanced Practice Providers on your designated Care Team:   Richardson Dopp, PA-C Vin [place], [place]

## 2021-05-13 NOTE — Progress Notes (Deleted)
HPI: [name] is a 70 y.o. female, who is here today for her routine physical.  Last CPE: ***  Regular exercise 3 or more time per week: *** Following a healthy diet: *** She lives with ***  Chronic medical problems: ***  Pap smear **** Hx of abnormal pap smears: *** Hx of STD's ***  Immunization History  Administered Date(s) Administered   Fluad Quad(high Dose 65+) 07/20/2020   Influenza Split 07/02/2012, 07/22/2013   Influenza, High Dose Seasonal PF 07/11/2019   Influenza,inj,Quad PF,6+ Mos 06/16/2014, 07/26/2016, 07/06/2017, 07/09/2018   PFIZER(Purple Top)SARS-COV-2 Vaccination 10/31/2019, 11/21/2019, 08/05/2020   Pneumococcal Conjugate-13 05/16/2019   Pneumococcal Polysaccharide-23 04/27/2017   Tdap 04/08/2008, 01/07/2018    Mammogram: *** Colonoscopy: *** DEXA: ***  Hep C screening: ***  She has *** concerns today.  Review of Systems  Current Outpatient Medications on File Prior to Visit  Medication Sig Dispense Refill   amLODipine (NORVASC) 5 MG tablet Take 1 tablet by mouth once daily 90 tablet 2   azelastine (ASTELIN) 0.1 % nasal spray Place 2 sprays into both nostrils 2 (two) times daily. Use in each nostril as directed 30 mL 11   calcium carbonate (TUMS EX) 750 MG chewable tablet Chew 2 tablets by mouth daily as needed for heartburn.     cetirizine (ZYRTEC) 10 MG tablet Take 10 mg by mouth daily. Alternating with Allegra     cyclobenzaprine (FLEXERIL) 10 MG tablet Take 2.5 mg by mouth daily as needed (pain level 8 or greater).      diclofenac sodium (VOLTAREN) 1 % GEL APPLY (4G) BY TOPICAL ROUTE 4 TIMES EVERY DAY TO THE AFFECTED AREA(S) (WC PA)  1   ELIQUIS 5 MG TABS tablet Take 1 tablet by mouth twice daily 180 tablet 2   fluticasone (FLONASE) 50 MCG/ACT nasal spray Use 2 spray(s) in each nostril once daily 48 g 0   guaiFENesin (MUCINEX) 600 MG 12 hr tablet Take 300 mg by mouth as needed.      hydrochlorothiazide (HYDRODIURIL) 25 MG  tablet TAKE 1 TABLET BY MOUTH ONCE DAILY(DUE FOR FOLLOW UP) 90 tablet 3   HYDROcodone-acetaminophen (NORCO/VICODIN) 5-325 MG tablet Take 0.5 tablets by mouth daily as needed (pain level 8 or greater). 30 tablet 0   hydrocortisone 2.5 % ointment APPLY TWICE A DAY 60 g 11   hydrOXYzine (ATARAX/VISTARIL) 25 MG tablet Take 1 tablet (25 mg total) by mouth daily as needed for itching. 30 tablet 0   LORazepam (ATIVAN) 0.5 MG tablet Take 0.5-1 tablets (0.25-0.5 mg total) by mouth daily as needed for anxiety. (Patient not taking: Reported on 05/11/2021) 20 tablet 1   Menthol-Camphor (ICY HOT ARTHRITIS PAIN RELIEF EX) Apply 1 application topically as needed.      metoprolol tartrate (LOPRESSOR) 25 MG tablet Take 1 tablet (25 mg total) by mouth 2 (two) times daily. 180 tablet 3   Multiple Vitamins-Minerals (MULTI-VITAMIN GUMMIES PO) Take by mouth as needed.  (Patient not taking: Reported on 05/11/2021)     omeprazole (PRILOSEC) 40 MG capsule Take 1 capsule (40 mg total) by mouth daily. 60 capsule 11   Polyethylene Glycol 3350 (MIRALAX PO) Take by mouth as needed.  (Patient not taking: Reported on 05/11/2021)     potassium chloride (KLOR-CON) 10 MEQ tablet Take 2 tablets by mouth once daily 180 tablet 2   pravastatin (PRAVACHOL) 20 MG tablet Take by mouth twice a week 27 tablet 3   trolamine salicylate (ASPERCREME) 10 % cream Apply  1 application topically as needed for muscle pain.     No current facility-administered medications on file prior to visit.     Past Medical History:  Diagnosis Date   Abdominal pain, epigastric 12/28/2008   Centricity Description: ABDOMINAL PAIN, EPIGASTRIC Qualifier: Diagnosis of  By: [name], [name]   Centricity Description: ABDOMINAL PAIN-EPIGASTRIC Qualifier: Diagnosis of  By: [name], [name]    Allergic rhinitis 09/16/2013   Allergy    Anemia    Anxiety    Anxiety disorder, unspecified 07/06/2017   Arthritis    Blood in stool    BMI 45.0-49.9, adult ([place])  07/26/2016   Class 3 obesity with serious comorbidity and body mass index (BMI) of 45.0 to 49.9 in adult 03/07/2017   DDD (degenerative disc disease), lumbar    DIVERTICULOSIS-COLON 12/28/2008   Qualifier: Diagnosis of  By: [name], [name]    GERD 12/28/2008   Qualifier: Diagnosis of  By: [name], [name]    GERD (gastroesophageal reflux disease)    Headache    Hyperglycemia 03/17/2014   Hypertension    Hypokalemia 07/26/2016   IBS (irritable bowel syndrome)-C-D 07/26/2016   Obesity    Other and unspecified hyperlipidemia 03/17/2014   PERSONAL HX COLONIC POLYPS 12/28/2008   Qualifier: Diagnosis of  By: [name], [name]    Reflux esophagitis 12/28/2008   Qualifier: Diagnosis of  By: [name], [name]     S/P lumbar spinal fusion 07/02/2012   Tubulovillous adenoma of colon 2008    Past Surgical History:  Procedure Laterality Date   ABDOMINAL HYSTERECTOMY     COLONOSCOPY  2014   laser surgery to remove scar     not sure of the year; after hyst   RADIOLOGY WITH ANESTHESIA N/A 02/25/2015   Procedure: MRI OF LUMBAR SPINE;  Surgeon: Medication Radiologist, MD;  Location: [facility];  Service: Radiology;  Laterality: N/A;  DR. Bennie Pierini COHEN/MRI   SPINE SURGERY     TONSILLECTOMY AND ADENOIDECTOMY      Allergies  Allergen Reactions   Crestor [Rosuvastatin]    Erythromycin     rash   Lansoprazole    Penicillins     rash   Potassium-Containing Compounds Other (See Comments)    Stomach upset, can take pills, but not the packets    Robaxin [Methocarbamol]     Messed up stomach   Sulfonamide Derivatives     rash   Trental [Pentoxifylline Er]     Bad headache    Family History  Problem Relation Age of Onset   Hypertension Mother    Hyperlipidemia Mother    Hyperlipidemia Father    Hypertension Father    Arthritis Father    Hyperlipidemia Sister    Cervical cancer Sister    Diabetes Sister    Heart disease Sister    Cancer Maternal Grandfather    Hypertension  Paternal Grandmother    Pancreatic cancer Maternal Aunt    Breast cancer Paternal Aunt    Breast cancer Cousin     Social History   Socioeconomic History   Marital status: Widowed    Spouse name: Not on file   Number of children: Not on file   Years of education: Not on file   Highest education level: Not on file  Occupational History   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   Social Determinants of Health   Financial Resource Strain: Low Risk    Difficulty of Paying Living Expenses: Not hard at all  Food Insecurity: No Food Insecurity   Worried About Charity fundraiser in the Last Year: Never true   [place] in the Last Year: Never true  Transportation Needs: No Transportation Needs   Lack of Transportation (Medical): No   Lack of Transportation (Non-Medical): No  Physical Activity: Inactive   Days of Exercise per Week: 0 days   Minutes of Exercise per Session: 0 min  Stress: No Stress Concern Present   Feeling of Stress : Only a little  Social Connections: Moderately Isolated   Frequency of Communication with Friends and Family: More than three times a week   Frequency of Social Gatherings with Friends and Family: Never   Attends Religious Services: 1 to 4 times per year   Active Member of [employer] or Organizations: No   Attends Archivist Meetings: Never   Marital Status: Widowed     There were no vitals filed for this visit. There is no height or weight on file to calculate BMI.   Wt Readings from Last 3  Encounters:  05/11/21 248 lb 6.4 oz (112.7 kg)  02/10/21 246 lb 4 oz (111.7 kg)  12/10/20 248 lb (112.5 kg)     Physical Exam    ASSESSMENT AND PLAN:  Ms. Palak Skowron was here today annual physical examination.     No orders of the defined types were placed in this encounter.   Diagnoses and all orders for this visit:  Type 2 diabetes mellitus with other specified complication, without long-term current use of insulin ([place])  Hyperlipidemia associated with type 2 diabetes mellitus ([place])    No problem-specific Assessment & Plan notes found for this encounter.            No follow-ups on file.          [name]. [name], MD  [facility]. [place] office.

## 2021-05-16 ENCOUNTER — Encounter: Payer: Medicare PPO | Admitting: Family Medicine

## 2021-05-16 ENCOUNTER — Other Ambulatory Visit: Payer: Self-pay

## 2021-05-16 ENCOUNTER — Telehealth: Payer: Self-pay

## 2021-05-16 DIAGNOSIS — E1169 Type 2 diabetes mellitus with other specified complication: Secondary | ICD-10-CM

## 2021-05-16 NOTE — Progress Notes (Signed)
HPI: [name] is a 70 y.o. female, who is here today for her routine physical.  Last CPE: 01/27/20  Regular exercise 3 or more time per week: She walking a few times per week for 5 min. Following a healthy diet: She does not feel like she eats a lot.Because IBS she has a few restrictions. Vegetables and fruit. She does not cook, eats take outs most of the time, she loves [place] food. She lives with her mother, she is her caregiver.  Chronic medical problems: DM II,chronic back pain with radiculopathy, anxiety,obesity,HLD,and HTN among some.  Pap smear 04/27/17. S/P hysterectomy. Hx of abnormal pap smears: Negative  Immunization History  Administered Date(s) Administered   Fluad Quad(high Dose 65+) 07/20/2020   Influenza Split 07/02/2012, 07/22/2013   Influenza, High Dose Seasonal PF 07/11/2019   Influenza,inj,Quad PF,6+ Mos 06/16/2014, 07/26/2016, 07/06/2017, 07/09/2018   PFIZER(Purple Top)SARS-COV-2 Vaccination 10/31/2019, 11/21/2019, 08/05/2020   Pneumococcal Conjugate-13 05/16/2019   Pneumococcal Polysaccharide-23 04/27/2017   Tdap 04/08/2008, 01/07/2018   Mammogram: 08/27/20, Bi-rads 1 Colonoscopy: 10/16/18, 5 years f/u was recommended. DEXA: 08/15/19 Hep C screening: Completed.  She has no concerns today. Atrial fibrillation and hypertension: She has seen her cardiologist on 05/11/21, Metoprolol 25 mg twice daily was started and Atenolol was d/c. She is also on HCTZ 25 mg daily and amlodipine 5 mg daily. Also taking Eliquis 5 mg twice daily. She denies checking BP at home.  Lab Results  Component Value Date   CHOL 169 12/10/2020   HDL 47.40 12/10/2020   LDLCALC 95 12/10/2020   TRIG 131.0 12/10/2020   CHOLHDL 4 12/10/2020   DM2 on nonpharmacologic treatment.  Lab Results  Component Value Date   HGBA1C 6.6 (H) 12/10/2020   Anxiety: She has not taking lorazepam for about a year, she does not like the way she feels when she takes it. She would  like to try a daily medication, low-dose. 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 Negative for depressed mood or suicidal thoughts.  She had a fall a week ago.She was on her bed cleaning the curtains, she lost balance and fell on floor. Hit her head, no LOC and has not had headache. Right pretibial area with small excoriation, healing well.  Review of Systems  Constitutional:  Positive for fatigue. Negative for appetite change and fever.  HENT:  Negative for dental problem, hearing loss, mouth sores and sore throat.   Eyes:  Negative for redness and visual disturbance.  Respiratory:  Negative for cough, shortness of breath and wheezing.   Cardiovascular:  Negative for chest pain and leg swelling.  Gastrointestinal:  Positive for abdominal pain (No more than usual, IBS). Negative for nausea and vomiting.       No changes in bowel habits.  Endocrine: Negative for cold intolerance, heat intolerance, polydipsia, polyphagia and polyuria.  Genitourinary:  Negative for decreased urine volume, dysuria, hematuria, vaginal bleeding and vaginal discharge.  Musculoskeletal:  Positive for arthralgias and back pain. Negative for gait problem.  Skin:  Negative for color change and rash.  Allergic/Immunologic: Positive for environmental allergies.  Neurological:  Negative for syncope, weakness and headaches.  Hematological:  Negative for adenopathy. Does not bruise/bleed easily.  Psychiatric/Behavioral:  Positive for sleep disturbance. Negative for confusion. The patient is nervous/anxious.   All other systems reviewed and are negative.  Current Outpatient Medications on File Prior to Visit  Medication Sig Dispense Refill   amLODipine (NORVASC) 5 MG tablet Take 1 tablet  by mouth once daily 90 tablet 2   azelastine (ASTELIN) 0.1 % nasal spray Place 2 sprays into both nostrils 2 (two) times daily. Use in each nostril as directed 30 mL 11   calcium carbonate (TUMS EX) 750 MG chewable  tablet Chew 2 tablets by mouth daily as needed for heartburn.     cetirizine (ZYRTEC) 10 MG tablet Take 10 mg by mouth daily. Alternating with Allegra     cyclobenzaprine (FLEXERIL) 10 MG tablet Take 2.5 mg by mouth daily as needed (pain level 8 or greater).      diclofenac sodium (VOLTAREN) 1 % GEL APPLY (4G) BY TOPICAL ROUTE 4 TIMES EVERY DAY TO THE AFFECTED AREA(S) (WC PA)  1   ELIQUIS 5 MG TABS tablet Take 1 tablet by mouth twice daily 180 tablet 2   fluticasone (FLONASE) 50 MCG/ACT nasal spray Use 2 spray(s) in each nostril once daily 48 g 0   guaiFENesin (MUCINEX) 600 MG 12 hr tablet Take 300 mg by mouth as needed.      hydrochlorothiazide (HYDRODIURIL) 25 MG tablet TAKE 1 TABLET BY MOUTH ONCE DAILY(DUE FOR FOLLOW UP) 90 tablet 3   HYDROcodone-acetaminophen (NORCO/VICODIN) 5-325 MG tablet Take 0.5 tablets by mouth daily as needed (pain level 8 or greater). 30 tablet 0   hydrocortisone 2.5 % ointment APPLY TWICE A DAY 60 g 11   hydrOXYzine (ATARAX/VISTARIL) 25 MG tablet Take 1 tablet (25 mg total) by mouth daily as needed for itching. 30 tablet 0   Menthol-Camphor (ICY HOT ARTHRITIS PAIN RELIEF EX) Apply 1 application topically as needed.      metoprolol tartrate (LOPRESSOR) 25 MG tablet Take 1 tablet (25 mg total) by mouth 2 (two) times daily. 180 tablet 3   Multiple Vitamins-Minerals (MULTI-VITAMIN GUMMIES PO) Take by mouth as needed.     omeprazole (PRILOSEC) 40 MG capsule Take 1 capsule (40 mg total) by mouth daily. 60 capsule 11   Polyethylene Glycol 3350 (MIRALAX PO) Take by mouth as needed.     potassium chloride (KLOR-CON) 10 MEQ tablet Take 2 tablets by mouth once daily 180 tablet 2   pravastatin (PRAVACHOL) 20 MG tablet Take by mouth twice a week 27 tablet 3   trolamine salicylate (ASPERCREME) 10 % cream Apply 1 application topically as needed for muscle pain.     No current facility-administered medications on file prior to visit.   Past Medical History:  Diagnosis Date    Abdominal pain, epigastric 12/28/2008   Centricity Description: ABDOMINAL PAIN, EPIGASTRIC Qualifier: Diagnosis of  By: [name], [name]   Centricity Description: ABDOMINAL PAIN-EPIGASTRIC Qualifier: Diagnosis of  By: [name], [name]    Allergic rhinitis 09/16/2013   Allergy    Anemia    Anxiety    Anxiety disorder, unspecified 07/06/2017   Arthritis    Blood in stool    BMI 45.0-49.9, adult ([place]) 07/26/2016   Class 3 obesity with serious comorbidity and body mass index (BMI) of 45.0 to 49.9 in adult 03/07/2017   DDD (degenerative disc disease), lumbar    DIVERTICULOSIS-COLON 12/28/2008   Qualifier: Diagnosis of  By: [name], [name]    GERD 12/28/2008   Qualifier: Diagnosis of  By: [name], [name]    GERD (gastroesophageal reflux disease)    Headache    Hyperglycemia 03/17/2014   Hypertension    Hypokalemia 07/26/2016   IBS (irritable bowel syndrome)-C-D 07/26/2016   Obesity    Other and unspecified hyperlipidemia 03/17/2014   PERSONAL  HX COLONIC POLYPS 12/28/2008   Qualifier: Diagnosis of  By: [name], [name]    Reflux esophagitis 12/28/2008   Qualifier: Diagnosis of  By: Julaine Hua CMA (AAMA), [name]     S/P lumbar spinal fusion 07/02/2012   Tubulovillous adenoma of colon 2008   Past Surgical History:  Procedure Laterality Date   ABDOMINAL HYSTERECTOMY     COLONOSCOPY  2014   laser surgery to remove scar     not sure of the year; after hyst   RADIOLOGY WITH ANESTHESIA N/A 02/25/2015   Procedure: MRI OF LUMBAR SPINE;  Surgeon: Medication Radiologist, MD;  Location: [facility];  Service: Radiology;  Laterality: N/A;  DR. Bennie Pierini COHEN/MRI   SPINE SURGERY     TONSILLECTOMY AND ADENOIDECTOMY     Allergies  Allergen Reactions   Crestor [Rosuvastatin]    Erythromycin     rash   Lansoprazole    Penicillins     rash   Potassium-Containing Compounds Other (See Comments)    Stomach upset, can take pills, but not the packets    Robaxin [Methocarbamol]     Messed  up stomach   Sulfonamide Derivatives     rash   Trental [Pentoxifylline Er]     Bad headache   Family History  Problem Relation Age of Onset   Hypertension Mother    Hyperlipidemia Mother    Hyperlipidemia Father    Hypertension Father    Arthritis Father    Hyperlipidemia Sister    Cervical cancer Sister    Diabetes Sister    Heart disease Sister    Cancer Maternal Grandfather    Hypertension Paternal Grandmother    Pancreatic cancer Maternal Aunt    Breast cancer Paternal Aunt    Breast cancer Cousin    Social History   Socioeconomic History   Marital status: Widowed    Spouse name: Not on file   Number of children: Not on file   Years of education: Not on file   Highest education level: Not on file  Occupational History   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   Social Determinants of Health   Financial Resource Strain: Low Risk    Difficulty of Paying Living Expenses: Not hard at all  Food Insecurity: No Food Insecurity   Worried About Charity fundraiser in the Last Year: Never true   [place] in the Last Year: Never true  Transportation Needs: No Transportation Needs   Lack of Transportation (Medical): No   Lack of Transportation (Non-Medical): No  Physical Activity: Inactive   Days of Exercise per Week: 0 days   Minutes of Exercise per Session: 0 min  Stress: No Stress Concern Present   Feeling of Stress : Only a little  Social Connections: Moderately Isolated   Frequency of Communication with Friends and  Family: More than three times a week   Frequency of Social Gatherings with Friends and  Family: Never   Attends Religious Services: 1 to 4 times per year   Active Member of Clubs or Organizations: No   Attends Archivist Meetings: Never   Marital Status: Widowed   Vitals:   05/17/21 0903  BP: 122/70  Pulse: (!) 50  Resp: 16  SpO2: 98%   Body mass index is 46.39 kg/m.  Wt Readings from Last 3 Encounters:  05/17/21 245 lb 8 oz (111.4 kg)  05/11/21 248 lb 6.4 oz (112.7 kg)  02/10/21 246 lb 4 oz (111.7 kg)   Physical Exam Vitals and nursing note reviewed.  Constitutional:      General: She is not in acute distress.    Appearance: She is well-developed.  HENT:     Head: Normocephalic and atraumatic.     Right Ear: Hearing, tympanic membrane, ear canal and external ear normal.     Left Ear: Hearing, tympanic membrane, ear canal and external ear normal.     Mouth/Throat:     Mouth: Mucous membranes are moist.     Pharynx: Oropharynx is clear. Uvula midline.  Eyes:     Extraocular Movements: Extraocular movements intact.     Conjunctiva/sclera: Conjunctivae normal.     Pupils: Pupils are equal, round, and reactive to light.  Neck:     Thyroid: No thyromegaly.     Trachea: No tracheal deviation.  Cardiovascular:     Rate and Rhythm: Bradycardia present. Rhythm irregular.     Pulses:          Dorsalis pedis pulses are 2+ on the right side and 2+ on the left side.     Heart sounds: No murmur heard. Pulmonary:     Effort: Pulmonary effort is normal. No respiratory distress.     Breath sounds: Normal breath sounds.  Chest:  Breasts:    Right: No supraclavicular adenopathy.     Left: No supraclavicular adenopathy.  Abdominal:     Palpations: Abdomen is soft. There is no hepatomegaly or mass.     Tenderness: There is no abdominal tenderness.  Musculoskeletal:     Comments: No signs of synovitis appreciated.  Lymphadenopathy:     Cervical: No cervical adenopathy.      Upper Body:     Right upper body: No supraclavicular adenopathy.     Left upper body: No supraclavicular adenopathy.  Skin:    General: Skin is warm.     Findings: Abrasion present. No erythema or rash.       Neurological:     General: No focal deficit present.     Mental Status: She is alert and oriented to person, place, and time.     Cranial Nerves: No cranial nerve deficit.     Coordination: Coordination normal.     Deep Tendon Reflexes:     Reflex Scores:      Bicep reflexes are 2+ on the right side and 2+ on the left side.      Patellar reflexes are 2+ on the right side and 2+ on the left side.    Comments: Antalgic gait, not assisted.  Psychiatric:        Speech: Speech normal.     Comments: Well groomed, good eye contact.   ASSESSMENT AND PLAN:  [name] was here today annual physical examination.  Diagnoses and all orders for this visit:  Routine general medical examination at a health care facility We discussed the importance of regular low impact physical activity and healthy diet  for prevention of chronic illness and/or complications. Preventive guidelines reviewed. Vaccination: She needs Shingrix, according the patient, she cannot take it because she has been on frequent prednisone/steroid treatment. Ca++ and vit D supplementation to continue. Next CPE in a year.  Type 2 diabetes mellitus with other specified complication, without long-term current use of insulin (HCC) Problem has been well controlled. Continue nonpharmacologic treatment.  Essential hypertension BP adequately controlled. Metoprolol tartrate decreased from 25 mg twice daily to 20 mg twice daily due to bradycardia. Continue rest of antihypertensive medications.  Morbid obesity with BMI of 45.0-49.9, adult ([place]) We discussed benefits of wt loss as well as adverse effects of obesity. Consistency with healthy diet and physical activity recommended. Weight Watchers is a  good option as well as daily brisk walking for 15-30 min as tolerated.  Anxiety disorder, unspecified type Problem is not well controlled. She agrees with trying fluoxetine 10 mg daily.  -     FLUoxetine (PROZAC) 10 MG capsule; Take 1 capsule (10 mg total) by mouth daily.  Bradycardia We discussed side effects of Metoprolol, dose decreased. Instructed to monitor HR at home.  Fall in home, initial encounter Fall precautions discussed, avoid risky activities that increase fall risk. Superficial excoriation is healing well. Monitor for signs of infection.  Return in 3 months (on 08/17/2021) for DM II,HTN,anxiety.  Jochebed Bills G. [name], MD  [facility]. [place] office.

## 2021-05-17 ENCOUNTER — Encounter: Payer: Self-pay | Admitting: Family Medicine

## 2021-05-17 ENCOUNTER — Ambulatory Visit (INDEPENDENT_AMBULATORY_CARE_PROVIDER_SITE_OTHER): Payer: Medicare PPO | Admitting: Family Medicine

## 2021-05-17 VITALS — BP 122/70 | HR 50 | Resp 16 | Ht 61.0 in | Wt 245.5 lb

## 2021-05-17 DIAGNOSIS — E1169 Type 2 diabetes mellitus with other specified complication: Secondary | ICD-10-CM

## 2021-05-17 DIAGNOSIS — I10 Essential (primary) hypertension: Secondary | ICD-10-CM

## 2021-05-17 DIAGNOSIS — Z0000 Encounter for general adult medical examination without abnormal findings: Secondary | ICD-10-CM

## 2021-05-17 DIAGNOSIS — Z6841 Body Mass Index (BMI) 40.0 and over, adult: Secondary | ICD-10-CM

## 2021-05-17 DIAGNOSIS — Y92009 Unspecified place in unspecified non-institutional (private) residence as the place of occurrence of the external cause: Secondary | ICD-10-CM

## 2021-05-17 DIAGNOSIS — W19XXXA Unspecified fall, initial encounter: Secondary | ICD-10-CM

## 2021-05-17 DIAGNOSIS — F419 Anxiety disorder, unspecified: Secondary | ICD-10-CM | POA: Diagnosis not present

## 2021-05-17 DIAGNOSIS — R001 Bradycardia, unspecified: Secondary | ICD-10-CM | POA: Diagnosis not present

## 2021-05-17 MED ORDER — FLUOXETINE HCL 10 MG PO CAPS: 10.0000 mg | ORAL_CAPSULE | Freq: Every day | ORAL | 1 refills | Status: DC

## 2021-05-17 NOTE — Patient Instructions (Addendum)
Today you have you routine preventive visit. A few things to remember from today's visit:  Routine general medical examination at a health care facility  Type 2 diabetes mellitus with other specified complication, without long-term current use of insulin ([place])  Essential hypertension  Morbid obesity with BMI of 45.0-49.9, adult (HCC)  Anxiety disorder, unspecified type - Plan: FLUoxetine (PROZAC) 10 MG capsule  Bradycardia  Paroxysmal atrial fibrillation (HCC) Your heart rate mildly low, Metoprolol can aggravate problem, you can take 1/2 tab instead 1 tab 2 times daily. Monitor heart rate.  those days you rest well.  If you need refills please call your pharmacy. Do not use My Chart to request refills or for acute issues that need immediate attention.   Please be sure medication list is accurate. If a new problem present, please set up appointment sooner than planned today.  At least 150 minutes of moderate exercise per week, daily brisk walking for 15-30 min is a good exercise option. Healthy diet low in saturated (animal) fats and sweets and consisting of fresh fruits and vegetables, lean meats such as fish and white chicken and whole grains.  These are some of recommendations for screening depending of age and risk factors:  - Vaccines:  Tdap vaccine every 10 years.  Shingles vaccine recommended at age 17, could be given after 68 years of age but not sure about insurance coverage.   Pneumonia vaccines: Pneumovax at 10. Sometimes Pneumovax is giving earlier if history of smoking, lung disease,diabetes,kidney disease among some.  Screening for diabetes at age 73 and every 3 years.N/A  Cervical cancer prevention:  Pap smear starts at 68 years of age and continues periodically until 68 years old in low risk women. Pap smear every 3 years between 27 and 71 years old. Pap smear every 3-5 years between women 31 and older if pap smear negative and HPV screening  negative.  -Breast cancer: Mammogram: There is disagreement between experts about when to start screening in low risk asymptomatic female but recent recommendations are to start screening at 26 and not later than 68 years old , every 1-2 years and after 68 yo q 2 years. Screening is recommended until 68 years old but some women can continue screening depending of healthy issues.  Colon cancer screening: Has been recently changed to 68 yo. Insurance may not cover until you are 68 years old. Screening is recommended until 68 years old.  Cholesterol disorder screening at age 32 and every 3 years.N/A  Also recommended:  Dental visit- Brush and floss your teeth twice daily; visit your dentist twice a year. Eye doctor- Get an eye exam at least every 2 years. Helmet use- Always wear a helmet when riding a bicycle, motorcycle, rollerblading or skateboarding. Safe sex- If you may be exposed to sexually transmitted infections, use a condom. Seat belts- Seat belts can save your live; always wear one. Smoke/Carbon Monoxide detectors- These detectors need to be installed on the appropriate level of your home. Replace batteries at least once a year. Skin cancer- When out in the sun please cover up and use sunscreen 15 SPF or higher. Violence- If anyone is threatening or hurting you, please tell your healthcare provider.  Drink alcohol in moderation- Limit alcohol intake to one drink or less per day. Never drink and drive. Calcium supplementation 1000 to 1200 mg daily, ideally through your diet.  Vitamin D supplementation 800 units daily.

## 2021-05-24 ENCOUNTER — Telehealth: Payer: Self-pay | Admitting: *Deleted

## 2021-05-24 ENCOUNTER — Telehealth: Payer: Self-pay | Admitting: Cardiovascular Disease

## 2021-05-24 NOTE — Telephone Encounter (Signed)
   [facility] Pre-operative Risk Assessment    Patient Name: [name]  DOB: 02-10-1951 MRN: [id number]  HEARTCARE STAFF:  - IMPORTANT!!!!!! Under Visit Info/Reason for Call, type in Other and utilize the format Clearance MM/DD/YY or Clearance TBD. Do not use dashes or single digits. - Please review there is not already an duplicate clearance open for this procedure. - If request is for dental extraction, please clarify the # of teeth to be extracted. - If the patient is currently at the dentist's office, call Pre-Op Callback Staff (MA/nurse) to input urgent request.  - If the patient is not currently in the dentist office, please route to the Pre-Op pool.  Request for surgical clearance:  What type of surgery is being performed? MRI under sedation, may also have injection at some point after MRI  When is this surgery scheduled? TBD  What type of clearance is required (medical clearance vs. Pharmacy clearance to hold med vs. Both)? Both  Are there any medications that need to be held prior to surgery and how long? Eliquis 3 days prior to injection  Practice name and name of physician performing surgery? [facility]  What is the office phone number? [phone]   7.   What is the office fax number? [phone]  8.   Anesthesia type (None, local, MAC, general) ? Sedation    Bevelyn Buckles L 05/24/2021, 12:34 PM  _________________________________________________________________   (provider comments below)

## 2021-05-24 NOTE — Telephone Encounter (Signed)
Spoke with pt and advised office doing procedure should submit a clearance request to [facility] and our preop team will review and contact requesting office.  Pt verbalizes understanding and states she will contact and advise of information provided.

## 2021-05-24 NOTE — Telephone Encounter (Signed)
Patient would like to speak with nurse regarding upcoming MRI.  She said that she needs approval from Dr. Acie Fredrickson to stop her Eliquis for an epidural as well as for her to be put to sleep for the MRI.  Please call patient regarding her concern.

## 2021-05-26 NOTE — Telephone Encounter (Signed)
   Name: [name]  DOB: [birth year]  MRN: [id number]   Primary Cardiologist: Mertie Moores, MD  Chart reviewed as part of pre-operative protocol coverage. Patient was contacted 05/26/2021 in reference to pre-operative risk assessment for pending surgery as outlined below.  [name] was last seen on 05/11/21 by Dr. Acie Fredrickson.  Since that day, she has continued to have chest pain when resting night. She is not sure if she wants to have the requested procedure because she does not want to risk having a stroke.   Per our clinical pharmacist: Per office protocol, patient can hold Eliquis for 3 days prior to procedure.  I will route this recommendation to the requesting party via Epic fax function and remove from pre-op pool. Please call with questions.  [place], PA 05/26/2021, 2:55 PM

## 2021-05-26 NOTE — Telephone Encounter (Signed)
Patient with diagnosis of A Fib on Eliquis for anticoagulation.    Procedure: MRI under sedation, may also have injection at some point after MRI   Date of procedure: TBD   CHA2DS2-VASc Score = 4  This indicates a 4.8% annual risk of stroke. The patient's score is based upon: CHF History: No HTN History: Yes Diabetes History: Yes Stroke History: No Vascular Disease History: No Age Score: 1 Gender Score: 1   CrCl 72 mL/min using adjusted body weight Platelet count 249k  Per office protocol, patient can hold Eliquis for 3 days prior to procedure.

## 2021-05-30 ENCOUNTER — Telehealth: Payer: Self-pay | Admitting: Cardiovascular Disease

## 2021-05-30 DIAGNOSIS — I4819 Other persistent atrial fibrillation: Secondary | ICD-10-CM

## 2021-05-30 NOTE — Telephone Encounter (Signed)
Spoke with the patient who states that she was switched from atenolol to metoprolol at her last office visit. She states that her heart rate has been fluctuating getting as low as 47 and as high as 143 at times. She states that she has also been having palpitations and feels weak. She does not have energy to do things. She denies any dizziness or SOB. Will send to Dr. Acie Fredrickson for advisement on changing metoprolol.

## 2021-05-30 NOTE — Telephone Encounter (Signed)
Dr. [name] was out of the office and I could schedule her with another provider, and she said, "NO," give me your next available.  The next open was Sept 14, which I offered; she said, "that's too long. I will die then and hangs up!  Pt will not give me the reason why the medication was not making her feel bad or what medication the cardiologist prescribed her.  Please follow with a phone call; pt number is [phone]

## 2021-05-30 NOTE — Telephone Encounter (Signed)
I called and spoke with patient. Her heart rate has been elevated since taking the Metoprolol down to 1/2 tablet twice daily (due to low heart rate during her physical on 8/9.)   Patient stated that her heart rate has been fluctuating since the change, sometimes it is normal, and other times it'll be up in the 140's. Patient received a call back from her cardiologist while we were on the phone. We will wait to see what they suggest and I will be able to see the note in Epic. Patient verbalized understanding.

## 2021-05-30 NOTE — Telephone Encounter (Signed)
   Patient Name: [name]  DOB: 1950/12/12 MRN: [id number]  Primary Cardiologist: Mertie Moores, MD  Chart reviewed as part of pre-operative protocol coverage.  Dr. Acie Fredrickson replied to chart with this update: "Pt is at low risk for back injection .  She may hold her eliquis for 3 days prior to injection "  Will route this bundled recommendation to requesting provider via Epic fax function. Please call with questions.  Charlie Pitter, PA-C 05/30/2021, 9:45 AM

## 2021-05-30 NOTE — Telephone Encounter (Signed)
Pt c/o medication issue: 1. Name of Medication: metoprolol tartrate 50 mg 2. How are you currently taking this medication (dosage and times per day)? BID 3. Are you having a reaction (difficulty breathing--STAT)?  No  4. What is your medication issue? Patient just do not feel good.

## 2021-05-30 NOTE — Telephone Encounter (Signed)
See Cardiology telephone note - awaiting response from pt's cardiologist.

## 2021-05-30 NOTE — Telephone Encounter (Signed)
Referral has been placed.  Patient felt better on atenolol

## 2021-05-31 MED ORDER — ATENOLOL 25 MG PO TABS: 25.0000 mg | ORAL_TABLET | Freq: Every day | ORAL | 3 refills | Status: DC

## 2021-05-31 NOTE — Telephone Encounter (Signed)
Spoke with the patient and advised her to stop metoprolol. Confirmed that she was previously taking atenolol 25 mg daily. Will send in rx for atenolol. Patient verbalized understanding.

## 2021-05-31 NOTE — Telephone Encounter (Signed)
Per notes from Cardiology - they are changing patient back to her Atenolol and discontinuing the Metoprolol.

## 2021-06-06 ENCOUNTER — Other Ambulatory Visit: Payer: Self-pay

## 2021-06-06 ENCOUNTER — Ambulatory Visit (HOSPITAL_COMMUNITY)
Admission: RE | Admit: 2021-06-06 | Discharge: 2021-06-06 | Disposition: A | Discharge status: Home / Self Care | Payer: Medicare PPO | Source: Ambulatory Visit | Attending: Physician Assistant | Admitting: Physician Assistant

## 2021-06-06 ENCOUNTER — Encounter (HOSPITAL_COMMUNITY): Payer: Self-pay | Admitting: Physician Assistant

## 2021-06-06 VITALS — BP 122/76 | HR 52 | Ht 61.0 in | Wt 244.4 lb

## 2021-06-06 DIAGNOSIS — Z79899 Other long term (current) drug therapy: Secondary | ICD-10-CM | POA: Insufficient documentation

## 2021-06-06 DIAGNOSIS — I10 Essential (primary) hypertension: Secondary | ICD-10-CM | POA: Insufficient documentation

## 2021-06-06 DIAGNOSIS — Z880 Allergy status to penicillin: Secondary | ICD-10-CM | POA: Insufficient documentation

## 2021-06-06 DIAGNOSIS — I480 Paroxysmal atrial fibrillation: Secondary | ICD-10-CM | POA: Insufficient documentation

## 2021-06-06 DIAGNOSIS — Z888 Allergy status to other drugs, medicaments and biological substances status: Secondary | ICD-10-CM | POA: Diagnosis not present

## 2021-06-06 DIAGNOSIS — Z882 Allergy status to sulfonamides status: Secondary | ICD-10-CM | POA: Insufficient documentation

## 2021-06-06 DIAGNOSIS — Z7901 Long term (current) use of anticoagulants: Secondary | ICD-10-CM | POA: Insufficient documentation

## 2021-06-06 DIAGNOSIS — D6869 Other thrombophilia: Secondary | ICD-10-CM | POA: Insufficient documentation

## 2021-06-06 DIAGNOSIS — Z87891 Personal history of nicotine dependence: Secondary | ICD-10-CM | POA: Insufficient documentation

## 2021-06-06 DIAGNOSIS — E785 Hyperlipidemia, unspecified: Secondary | ICD-10-CM | POA: Diagnosis not present

## 2021-06-06 DIAGNOSIS — Z8249 Family history of ischemic heart disease and other diseases of the circulatory system: Secondary | ICD-10-CM | POA: Insufficient documentation

## 2021-06-06 DIAGNOSIS — Z6841 Body Mass Index (BMI) 40.0 and over, adult: Secondary | ICD-10-CM | POA: Insufficient documentation

## 2021-06-06 DIAGNOSIS — E669 Obesity, unspecified: Secondary | ICD-10-CM | POA: Diagnosis not present

## 2021-06-06 MED ORDER — FLECAINIDE ACETATE 50 MG PO TABS: 50.0000 mg | ORAL_TABLET | Freq: Two times a day (BID) | ORAL | 3 refills | Status: DC

## 2021-06-06 NOTE — Patient Instructions (Signed)
Start Flecainide 50mg twice a day 

## 2021-06-06 NOTE — Progress Notes (Signed)
Primary Care Physician: [name], [name], MD Primary Cardiologist: Dr Acie Fredrickson Primary Electrophysiologist: none Referring Physician: Dr Doristine Mango Maggiemae [name] is a 70 y.o. female with a history of HTN, HLD, and atrial fibrillation who presents for consultation in the [facility].  The patient was initially diagnosed with atrial fibrillation 04/2020 after presenting to the her PCP office with palpitations. She also wore a 30 day event monitor which showed a 7% afib burden. Patient is on Eliquis for a CHADS2VASC score of 4. Patient reports that recently she has had more episodes of palpitations at rest. She was changed to metoprolol but this did not control her heart rate as well although she was on prednisone at the time. She is in SR today. There does not appear to be any specific trigger for her afib.   Today, she denies symptoms of chest pain, shortness of breath, orthopnea, PND, lower extremity edema, dizziness, presyncope, syncope, snoring, daytime somnolence, bleeding, or neurologic sequela. The patient is tolerating medications without difficulties and is otherwise without complaint today.    Atrial Fibrillation Risk Factors:  she does not have symptoms or diagnosis of sleep apnea. she does not have a history of rheumatic fever. she does not have a history of alcohol use.   she has a BMI of Body mass index is 46.18 kg/m.Marland Kitchen Filed Weights   06/06/21 0837  Weight: 110.9 kg    Family History  Problem Relation Age of Onset   Hypertension Mother    Hyperlipidemia Mother    Hyperlipidemia Father    Hypertension Father    Arthritis Father    Hyperlipidemia Sister    Cervical cancer Sister    Diabetes Sister    Heart disease Sister    Cancer Maternal Grandfather    Hypertension Paternal Grandmother    Pancreatic cancer Maternal Aunt    Breast cancer Paternal Aunt    Breast cancer Cousin      Atrial Fibrillation Management  history:  Previous antiarrhythmic drugs: none Previous cardioversions: none Previous ablations: none CHADS2VASC score: 4 Anticoagulation history: Eliquis   Past Medical History:  Diagnosis Date   Abdominal pain, epigastric 12/28/2008   Centricity Description: ABDOMINAL PAIN, EPIGASTRIC Qualifier: Diagnosis of  By: Julaine Hua CMA (AAMA), [name]   Centricity Description: ABDOMINAL PAIN-EPIGASTRIC Qualifier: Diagnosis of  By: [name], [name]    Allergic rhinitis 09/16/2013   Allergy    Anemia    Anxiety    Anxiety disorder, unspecified 07/06/2017   Arthritis    Blood in stool    BMI 45.0-49.9, adult ([place]) 07/26/2016   Class 3 obesity with serious comorbidity and body mass index (BMI) of 45.0 to 49.9 in adult 03/07/2017   DDD (degenerative disc disease), lumbar    DIVERTICULOSIS-COLON 12/28/2008   Qualifier: Diagnosis of  By: [name], [name]    GERD 12/28/2008   Qualifier: Diagnosis of  By: [name], [name]    GERD (gastroesophageal reflux disease)    Headache    Hyperglycemia 03/17/2014   Hypertension    Hypokalemia 07/26/2016   IBS (irritable bowel syndrome)-C-D 07/26/2016   Obesity    Other and unspecified hyperlipidemia 03/17/2014   PERSONAL HX COLONIC POLYPS 12/28/2008   Qualifier: Diagnosis of  By: [name], [name]    Reflux esophagitis 12/28/2008   Qualifier: Diagnosis of  By: Julaine Hua CMA (AAMA), [name]     S/P lumbar spinal fusion 07/02/2012   Tubulovillous adenoma of colon 2008  Past Surgical History:  Procedure Laterality Date   ABDOMINAL HYSTERECTOMY     COLONOSCOPY  2014   laser surgery to remove scar     not sure of the year; after hyst   RADIOLOGY WITH ANESTHESIA N/A 02/25/2015   Procedure: MRI OF LUMBAR SPINE;  Surgeon: Medication Radiologist, MD;  Location: [place];  Service: Radiology;  Laterality: N/A;  DR. Bennie Pierini COHEN/MRI   SPINE SURGERY     TONSILLECTOMY AND ADENOIDECTOMY      Current Outpatient Medications  Medication Sig Dispense Refill    amLODipine (NORVASC) 5 MG tablet Take 1 tablet by mouth once daily 90 tablet 2   atenolol (TENORMIN) 25 MG tablet Take 1 tablet (25 mg total) by mouth daily. 90 tablet 3   azelastine (ASTELIN) 0.1 % nasal spray Place 2 sprays into both nostrils 2 (two) times daily. Use in each nostril as directed 30 mL 11   calcium carbonate (TUMS EX) 750 MG chewable tablet Chew 2 tablets by mouth daily as needed for heartburn.     cetirizine (ZYRTEC) 10 MG tablet Take 10 mg by mouth daily. Alternating with Allegra     cyclobenzaprine (FLEXERIL) 10 MG tablet Take 2.5 mg by mouth daily as needed (pain level 8 or greater).      diclofenac sodium (VOLTAREN) 1 % GEL APPLY (4G) BY TOPICAL ROUTE 4 TIMES EVERY DAY TO THE AFFECTED AREA(S) (WC PA)  1   ELIQUIS 5 MG TABS tablet Take 1 tablet by mouth twice daily 180 tablet 2   flecainide (TAMBOCOR) 50 MG tablet Take 1 tablet (50 mg total) by mouth 2 (two) times daily. 60 tablet 3   FLUoxetine (PROZAC) 10 MG capsule Take 1 capsule (10 mg total) by mouth daily. 90 capsule 1   fluticasone (FLONASE) 50 MCG/ACT nasal spray Use 2 spray(s) in each nostril once daily 48 g 0   guaiFENesin (MUCINEX) 600 MG 12 hr tablet Take 300 mg by mouth as needed.      hydrochlorothiazide (HYDRODIURIL) 25 MG tablet TAKE 1 TABLET BY MOUTH ONCE DAILY(DUE FOR FOLLOW UP) 90 tablet 3   HYDROcodone-acetaminophen (NORCO/VICODIN) 5-325 MG tablet Take 0.5 tablets by mouth daily as needed (pain level 8 or greater). 30 tablet 0   hydrocortisone 2.5 % ointment APPLY TWICE A DAY 60 g 11   hydrOXYzine (ATARAX/VISTARIL) 25 MG tablet Take 1 tablet (25 mg total) by mouth daily as needed for itching. 30 tablet 0   Menthol-Camphor (ICY HOT ARTHRITIS PAIN RELIEF EX) Apply 1 application topically as needed.      Multiple Vitamins-Minerals (MULTI-VITAMIN GUMMIES PO) Take by mouth as needed.     omeprazole (PRILOSEC) 40 MG capsule Take 1 capsule (40 mg total) by mouth daily. 60 capsule 11   Polyethylene Glycol 3350  (MIRALAX PO) Take by mouth as needed.     potassium chloride (KLOR-CON) 10 MEQ tablet Take 2 tablets by mouth once daily 180 tablet 2   pravastatin (PRAVACHOL) 20 MG tablet Take by mouth twice a week 27 tablet 3   trolamine salicylate (ASPERCREME) 10 % cream Apply 1 application topically as needed for muscle pain.     No current facility-administered medications for this encounter.    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   Social Determinants of Health   Financial Resource Strain: Low Risk    Difficulty of Paying Living Expenses: Not hard at all  Food Insecurity: No Food Insecurity   Worried About Charity fundraiser in the Last Year: Never true   [place] in the Last Year: Never true  Transportation Needs: No Transportation Needs   Lack of Transportation (Medical): No   Lack of Transportation  (Non-Medical): No  Physical Activity: Inactive   Days of Exercise per Week: 0 days   Minutes of Exercise per Session: 0 min  Stress: No Stress Concern Present   Feeling of Stress : Only a little  Social Connections: Moderately Isolated   Frequency of Communication with Friends and Family: More than three times a week   Frequency of Social Gatherings with Friends and Family: Never   Attends Religious Services: 1 to 4 times per year   Active Member of [employer] or Organizations: No   Attends Archivist Meetings: Never   Marital Status: Widowed  Human resources officer Violence: Not At Risk   Fear of Current or Ex-Partner: No   Emotionally Abused: No   Physically Abused: No   Sexually Abused: No     ROS- All systems are reviewed and negative except as per the HPI above.  Physical Exam: Vitals:   06/06/21 0837  BP: 122/76  Pulse: (!) 52  Weight: 110.9 kg  Height: '5\' 1"'$  (1.549 m)    GEN- The patient is a well appearing obese female, alert and oriented x 3 today.   Head- normocephalic, atraumatic Eyes-  Sclera clear, conjunctiva pink Ears- hearing intact Oropharynx- clear Neck- supple  Lungs- Clear to ausculation bilaterally, normal work of breathing Heart- Regular rate and rhythm, bradycardia, no murmurs, rubs or gallops  GI- soft, NT, ND, + BS Extremities- no clubbing, cyanosis, or edema MS- no significant deformity or atrophy Skin- no rash or lesion Psych- euthymic mood, full affect Neuro- strength and sensation are intact  Wt Readings from Last 3 Encounters:  06/06/21 110.9 kg  05/17/21 111.4 kg  05/11/21 112.7 kg    EKG today demonstrates  SB Vent. rate 52 BPM PR interval 164 ms QRS duration 76 ms QT/QTcB 460/427 ms  Echo 05/11/20 demonstrated   1. Left ventricular ejection fraction, by estimation, is 60 to 65%. The left ventricle has normal function.  The left ventricle has no regional wall motion abnormalities. Left ventricular diastolic parameters were  normal.   2. Right ventricular systolic function is normal. The right ventricular  size is normal. There is normal pulmonary artery systolic pressure.   3. The mitral valve is normal in structure. Trivial mitral valve  regurgitation. No evidence of mitral stenosis.   4. The aortic valve is tricuspid. Aortic valve regurgitation is not  visualized. Mild aortic valve sclerosis is present, with no evidence of aortic valve stenosis.   5. The inferior vena cava is normal in size with greater than 50%  respiratory variability, suggesting right atrial pressure of 3 mmHg.   Epic records are reviewed at length today  CHA2DS2-VASc Score = 4  The patient's score is based upon: CHF History: No HTN History: Yes Diabetes History: Yes Stroke History: No Vascular Disease History: No Age Score: 1 Gender Score: 1      ASSESSMENT AND PLAN: 1. Paroxysmal Atrial Fibrillation (ICD10:  I48.0) The patient's CHA2DS2-VASc score is 4, indicating a 4.8% annual risk of stroke.   Patient in [place] today. We discussed therapeutic options today. After discussing the risks and benefits, will start flecainide 50 mg BID. Check ECG later this week. Continue Eliquis 5 mg BID Continue atenolol 25 mg daily  2. Secondary Hypercoagulable State (ICD10:  D68.69) The patient is at significant risk for stroke/thromboembolism based upon her CHA2DS2-VASc Score of 4.  Continue Apixaban (Eliquis).   3. Obesity Body mass index is 46.18 kg/m. Lifestyle modification was discussed at length including regular exercise and weight reduction.  4. HTN Stable, no changes today.   Follow up this Thursday for ECG after starting flecainide.    [facility] [address] [place], [place] [address] [phone] 06/06/2021 9:14 AM

## 2021-06-09 ENCOUNTER — Ambulatory Visit (HOSPITAL_COMMUNITY)
Admission: RE | Admit: 2021-06-09 | Discharge: 2021-06-09 | Disposition: A | Discharge status: Home / Self Care | Payer: Medicare PPO | Source: Ambulatory Visit | Attending: Physician Assistant | Admitting: Physician Assistant

## 2021-06-09 ENCOUNTER — Other Ambulatory Visit: Payer: Self-pay

## 2021-06-09 DIAGNOSIS — I480 Paroxysmal atrial fibrillation: Secondary | ICD-10-CM | POA: Diagnosis not present

## 2021-06-09 MED ORDER — ATENOLOL 25 MG PO TABS: 12.5000 mg | ORAL_TABLET | Freq: Every day | ORAL | 3 refills | Status: DC

## 2021-06-09 NOTE — Progress Notes (Signed)
Patient returns for ECG after starting flecainide. ECG shows SB HR 50, PR 164, QRS 86, QTc 397. She reports more fatigue/sleepiness. When asked about symptoms of OSA she replies "I don't have sleep apnea" and declines further testing. Will decrease atenolol to 12.5 mg daily with bradycardia and fatigue. F/u in 3 weeks.

## 2021-06-09 NOTE — Patient Instructions (Signed)
Decrease atenolol to 1/2 tablet a day

## 2021-06-10 ENCOUNTER — Telehealth (HOSPITAL_COMMUNITY): Payer: Self-pay | Admitting: *Deleted

## 2021-06-10 NOTE — Telephone Encounter (Signed)
Pt called in stating since decreasing her atenolol to 12.'5mg'$  earlier this week she noted- today her HR has been in the 120s. Pt is going to try moving the whole tablet to bedtime and see if this helps with her fatigue. She will touch base next week with update.

## 2021-06-27 ENCOUNTER — Encounter: Payer: Self-pay | Admitting: Family Medicine

## 2021-06-27 DIAGNOSIS — E1136 Type 2 diabetes mellitus with diabetic cataract: Secondary | ICD-10-CM | POA: Diagnosis not present

## 2021-06-27 DIAGNOSIS — H5203 Hypermetropia, bilateral: Secondary | ICD-10-CM | POA: Diagnosis not present

## 2021-06-27 DIAGNOSIS — H524 Presbyopia: Secondary | ICD-10-CM | POA: Diagnosis not present

## 2021-06-27 DIAGNOSIS — H25813 Combined forms of age-related cataract, bilateral: Secondary | ICD-10-CM | POA: Diagnosis not present

## 2021-06-27 LAB — HM DIABETES EYE EXAM

## 2021-06-29 ENCOUNTER — Ambulatory Visit (HOSPITAL_COMMUNITY)
Admission: RE | Admit: 2021-06-29 | Discharge: 2021-06-29 | Disposition: A | Discharge status: Home / Self Care | Payer: Medicare PPO | Source: Ambulatory Visit | Attending: Physician Assistant | Admitting: Physician Assistant

## 2021-06-29 ENCOUNTER — Other Ambulatory Visit: Payer: Self-pay

## 2021-06-29 ENCOUNTER — Other Ambulatory Visit: Payer: Self-pay | Admitting: Family Medicine

## 2021-06-29 VITALS — BP 122/74 | HR 110 | Ht 61.0 in | Wt 243.4 lb

## 2021-06-29 DIAGNOSIS — E785 Hyperlipidemia, unspecified: Secondary | ICD-10-CM | POA: Insufficient documentation

## 2021-06-29 DIAGNOSIS — Z880 Allergy status to penicillin: Secondary | ICD-10-CM | POA: Diagnosis not present

## 2021-06-29 DIAGNOSIS — I10 Essential (primary) hypertension: Secondary | ICD-10-CM

## 2021-06-29 DIAGNOSIS — Z888 Allergy status to other drugs, medicaments and biological substances status: Secondary | ICD-10-CM | POA: Diagnosis not present

## 2021-06-29 DIAGNOSIS — Z7901 Long term (current) use of anticoagulants: Secondary | ICD-10-CM | POA: Insufficient documentation

## 2021-06-29 DIAGNOSIS — Z79899 Other long term (current) drug therapy: Secondary | ICD-10-CM | POA: Diagnosis not present

## 2021-06-29 DIAGNOSIS — E669 Obesity, unspecified: Secondary | ICD-10-CM | POA: Diagnosis not present

## 2021-06-29 DIAGNOSIS — Z6841 Body Mass Index (BMI) 40.0 and over, adult: Secondary | ICD-10-CM | POA: Insufficient documentation

## 2021-06-29 DIAGNOSIS — Z8249 Family history of ischemic heart disease and other diseases of the circulatory system: Secondary | ICD-10-CM | POA: Diagnosis not present

## 2021-06-29 DIAGNOSIS — D6869 Other thrombophilia: Secondary | ICD-10-CM | POA: Insufficient documentation

## 2021-06-29 DIAGNOSIS — Z87891 Personal history of nicotine dependence: Secondary | ICD-10-CM | POA: Diagnosis not present

## 2021-06-29 DIAGNOSIS — Z882 Allergy status to sulfonamides status: Secondary | ICD-10-CM | POA: Diagnosis not present

## 2021-06-29 DIAGNOSIS — Z7182 Exercise counseling: Secondary | ICD-10-CM | POA: Diagnosis not present

## 2021-06-29 DIAGNOSIS — I480 Paroxysmal atrial fibrillation: Secondary | ICD-10-CM | POA: Insufficient documentation

## 2021-06-29 MED ORDER — ATENOLOL 25 MG PO TABS: 12.5000 mg | ORAL_TABLET | Freq: Every day | ORAL | 3 refills | Status: DC

## 2021-06-29 MED ORDER — FLECAINIDE ACETATE 100 MG PO TABS: 100.0000 mg | ORAL_TABLET | Freq: Two times a day (BID) | ORAL | 2 refills | Status: DC

## 2021-06-29 NOTE — Progress Notes (Signed)
Primary Care Physician: [name], [name], MD Primary Cardiologist: Dr Acie Fredrickson Primary Electrophysiologist: none Referring Physician: Dr Doristine Mango [name] is a 70 y.o. female with a history of HTN, HLD, and atrial fibrillation who presents for follow up in the [facility].  The patient was initially diagnosed with atrial fibrillation 04/2020 after presenting to the her PCP office with palpitations. She also wore a 30 day event monitor which showed a 7% afib burden. Patient is on Eliquis for a CHADS2VASC score of 4. Patient reports that recently she has had more episodes of palpitations at rest. She was changed to metoprolol but this did not control her heart rate as well although she was on prednisone at the time.  On follow up today, patient reports she still has periods of rapid heart rates with symptoms of palpitations, SOB, and fatigue. She has taken extra doses of atenolol which has helped but she also notes low heart rates (low 50s upper 40s) when she does this.   Today, she denies symptoms of chest pain, orthopnea, PND, lower extremity edema, dizziness, presyncope, syncope, bleeding, or neurologic sequela. The patient is tolerating medications without difficulties and is otherwise without complaint today.    Atrial Fibrillation Risk Factors:  she does have symptoms or diagnosis of sleep apnea. She declines sleep study. she does not have a history of rheumatic fever. she does not have a history of alcohol use.   she has a BMI of Body mass index is 45.99 kg/m.Marland Kitchen Filed Weights   06/29/21 0932  Weight: 110.4 kg     Family History  Problem Relation Age of Onset   Hypertension Mother    Hyperlipidemia Mother    Hyperlipidemia Father    Hypertension Father    Arthritis Father    Hyperlipidemia Sister    Cervical cancer Sister    Diabetes Sister    Heart disease Sister    Cancer Maternal Grandfather    Hypertension Paternal Grandmother     Pancreatic cancer Maternal Aunt    Breast cancer Paternal Aunt    Breast cancer Cousin      Atrial Fibrillation Management history:  Previous antiarrhythmic drugs: flecainide  Previous cardioversions: none Previous ablations: none CHADS2VASC score: 4 Anticoagulation history: Eliquis   Past Medical History:  Diagnosis Date   Abdominal pain, epigastric 12/28/2008   Centricity Description: ABDOMINAL PAIN, EPIGASTRIC Qualifier: Diagnosis of  By: Julaine Hua CMA (AAMA), [name]   Centricity Description: ABDOMINAL PAIN-EPIGASTRIC Qualifier: Diagnosis of  By: [name], [name]    Allergic rhinitis 09/16/2013   Allergy    Anemia    Anxiety    Anxiety disorder, unspecified 07/06/2017   Arthritis    Blood in stool    BMI 45.0-49.9, adult ([place]) 07/26/2016   Class 3 obesity with serious comorbidity and body mass index (BMI) of 45.0 to 49.9 in adult 03/07/2017   DDD (degenerative disc disease), lumbar    DIVERTICULOSIS-COLON 12/28/2008   Qualifier: Diagnosis of  By: [name], [name]    GERD 12/28/2008   Qualifier: Diagnosis of  By: [name], [name]    GERD (gastroesophageal reflux disease)    Headache    Hyperglycemia 03/17/2014   Hypertension    Hypokalemia 07/26/2016   IBS (irritable bowel syndrome)-C-D 07/26/2016   Obesity    Other and unspecified hyperlipidemia 03/17/2014   PERSONAL HX COLONIC POLYPS 12/28/2008   Qualifier: Diagnosis of  By: [name], [name]    Reflux  esophagitis 12/28/2008   Qualifier: Diagnosis of  By: Julaine Hua CMA (AAMA), [name]     S/P lumbar spinal fusion 07/02/2012   Tubulovillous adenoma of colon 2008   Past Surgical History:  Procedure Laterality Date   ABDOMINAL HYSTERECTOMY     COLONOSCOPY  2014   laser surgery to remove scar     not sure of the year; after hyst   RADIOLOGY WITH ANESTHESIA N/A 02/25/2015   Procedure: MRI OF LUMBAR SPINE;  Surgeon: Medication Radiologist, MD;  Location: [place];  Service: Radiology;  Laterality: N/A;  DR. Bennie Pierini  COHEN/MRI   SPINE SURGERY     TONSILLECTOMY AND ADENOIDECTOMY      Current Outpatient Medications  Medication Sig Dispense Refill   amLODipine (NORVASC) 5 MG tablet Take 1 tablet by mouth once daily 90 tablet 2   Aspirin-Salicylamide-Caffeine (BC FAST PAIN RELIEF) 650-195-33.3 MG PACK Take 1 tablet by mouth as needed.     atenolol (TENORMIN) 25 MG tablet Take 0.5 tablets (12.5 mg total) by mouth daily. 90 tablet 3   azelastine (ASTELIN) 0.1 % nasal spray Place 2 sprays into both nostrils 2 (two) times daily. Use in each nostril as directed 30 mL 11   calcium carbonate (TUMS EX) 750 MG chewable tablet Chew 2 tablets by mouth daily as needed for heartburn.     cetirizine (ZYRTEC) 10 MG tablet Take 10 mg by mouth daily. Alternating with Allegra     cyclobenzaprine (FLEXERIL) 10 MG tablet Take 2.5 mg by mouth daily as needed (pain level 8 or greater).      diclofenac sodium (VOLTAREN) 1 % GEL APPLY (4G) BY TOPICAL ROUTE 4 TIMES EVERY DAY TO THE AFFECTED AREA(S) (WC PA)  1   ELIQUIS 5 MG TABS tablet Take 1 tablet by mouth twice daily 180 tablet 2   flecainide (TAMBOCOR) 50 MG tablet Take 1 tablet (50 mg total) by mouth 2 (two) times daily. 60 tablet 3   FLUoxetine (PROZAC) 10 MG capsule Take 1 capsule (10 mg total) by mouth daily. 90 capsule 1   fluticasone (FLONASE) 50 MCG/ACT nasal spray Use 2 spray(s) in each nostril once daily 48 g 0   guaiFENesin (MUCINEX) 600 MG 12 hr tablet Take 300 mg by mouth as needed.      hydrochlorothiazide (HYDRODIURIL) 25 MG tablet TAKE 1 TABLET BY MOUTH ONCE DAILY(DUE FOR FOLLOW UP) 90 tablet 3   HYDROcodone-acetaminophen (NORCO/VICODIN) 5-325 MG tablet Take 0.5 tablets by mouth daily as needed (pain level 8 or greater). 30 tablet 0   hydrocortisone 2.5 % ointment APPLY TWICE A DAY 60 g 11   Multiple Vitamins-Minerals (MULTI-VITAMIN GUMMIES PO) Take by mouth as needed.     omeprazole (PRILOSEC) 40 MG capsule Take 1 capsule (40 mg total) by mouth daily. 60 capsule  11   Polyethylene Glycol 3350 (MIRALAX PO) Take by mouth as needed.     potassium chloride (KLOR-CON) 10 MEQ tablet Take 2 tablets by mouth once daily 180 tablet 2   pravastatin (PRAVACHOL) 20 MG tablet Take by mouth twice a week 27 tablet 3   trolamine salicylate (ASPERCREME) 10 % cream Apply 1 application topically as needed for muscle pain.     hydrOXYzine (ATARAX/VISTARIL) 25 MG tablet Take 1 tablet (25 mg total) by mouth daily as needed for itching. (Patient not taking: Reported on 06/29/2021) 30 tablet 0   No current facility-administered medications for this encounter.    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   Social Determinants of Health   Financial Resource Strain: Low Risk    Difficulty of Paying Living Expenses: Not hard at all  Food Insecurity: No Food Insecurity   Worried About Sales executive in the Last Year: Never true   [place] in the Last Year: Never true  Transportation Needs: No Transportation Needs   Lack of Transportation (Medical): No   Lack of Transportation (Non-Medical): No  Physical Activity: Inactive   Days of Exercise per Week: 0 days   Minutes of Exercise per Session: 0 min  Stress: No Stress Concern Present   Feeling of Stress : Only a little  Social Connections: Moderately Isolated   Frequency of Communication with Friends and Family: More than three times a week   Frequency of Social Gatherings with Friends and Family: Never   Attends Religious Services: 1 to 4 times per year   Active Member of [employer] or Organizations: No   Attends Archivist Meetings: Never   Marital Status: Widowed  Human resources officer Violence: Not At Risk   Fear of Current or Ex-Partner: No   Emotionally Abused: No   Physically Abused: No   Sexually Abused: No     ROS- All systems are reviewed and negative except as per the HPI above.  Physical Exam: Vitals:   06/29/21 0932  BP: 122/74  Pulse: (!) 110  Weight: 110.4 kg  Height: 5\' 1"  (1.549 m)    GEN- The patient is a well appearing obese female, alert and oriented x 3 today.   HEENT-head normocephalic, atraumatic, sclera clear, conjunctiva pink, hearing intact, trachea midline. Lungs- Clear to ausculation bilaterally, normal work of breathing Heart- irregular rate and rhythm, no murmurs, rubs or gallops  GI- soft, NT, ND, + BS Extremities- no clubbing, cyanosis, or edema MS- no significant deformity or atrophy Skin- no rash or lesion Psych- euthymic mood, full affect Neuro- strength and sensation are intact   Wt Readings from Last 3 Encounters:  06/29/21 110.4 kg  06/06/21 110.9 kg  05/17/21 111.4 kg    EKG today demonstrates  Atypical atrial flutter with variable  block Vent. rate 110 BPM PR interval * ms QRS duration 96 ms QT/QTcB 292/395 ms  Echo 05/11/20 demonstrated   1. Left  ventricular ejection fraction, by estimation, is 60 to 65%. The left ventricle has normal function. The left ventricle has no regional wall motion abnormalities. Left ventricular diastolic parameters were normal.   2. Right ventricular systolic function is normal. The right ventricular  size is normal. There is normal pulmonary artery systolic pressure.   3. The mitral valve is normal in structure. Trivial mitral valve  regurgitation. No evidence of mitral stenosis.   4. The aortic valve is tricuspid. Aortic valve regurgitation is not  visualized. Mild aortic valve sclerosis is present, with no evidence of aortic valve stenosis.   5. The inferior vena cava is normal in size with greater than 50%  respiratory variability, suggesting right atrial pressure of 3 mmHg.   Epic records are reviewed at length today  CHA2DS2-VASc Score = 4  The patient's score is based upon: CHF History: 0 HTN History: 1 Diabetes History: 1 Stroke History: 0 Vascular Disease History: 0 Age Score: 1 Gender Score: 1      ASSESSMENT AND PLAN: 1. Paroxysmal Atrial Fibrillation/atypical atrial flutter The patient's CHA2DS2-VASc score is 4, indicating a 4.8% annual risk of stroke.   Patient in atrial flutter today. Increase flecainide to 100 mg BID Continue Eliquis 5 mg BID Continue atenolol 12.5 mg daily with an extra 12.5 mg for heart racing.  2. Secondary Hypercoagulable State (ICD10:  D68.69) The patient is at significant risk for stroke/thromboembolism based upon her CHA2DS2-VASc Score of 4.  Continue Apixaban (Eliquis).   3. Obesity Body mass index is 45.99 kg/m. Lifestyle modification was discussed and encouraged including regular physical activity and weight reduction.  4. HTN Stable, no changes today.   Follow up in the AF clinic within the week for ECG.   [facility] [address] [place], [place] [address] [phone] 06/29/2021 9:51 AM

## 2021-06-29 NOTE — Patient Instructions (Signed)
Increase flecainide to 100mg twice a day 

## 2021-07-05 ENCOUNTER — Ambulatory Visit (HOSPITAL_COMMUNITY)
Admission: RE | Admit: 2021-07-05 | Discharge: 2021-07-05 | Disposition: A | Discharge status: Home / Self Care | Payer: Medicare PPO | Source: Ambulatory Visit | Attending: Physician Assistant | Admitting: Physician Assistant

## 2021-07-05 ENCOUNTER — Telehealth: Payer: Self-pay | Admitting: Family Medicine

## 2021-07-05 ENCOUNTER — Other Ambulatory Visit: Payer: Self-pay

## 2021-07-05 VITALS — HR 48

## 2021-07-05 DIAGNOSIS — Z7901 Long term (current) use of anticoagulants: Secondary | ICD-10-CM | POA: Diagnosis not present

## 2021-07-05 DIAGNOSIS — Z8249 Family history of ischemic heart disease and other diseases of the circulatory system: Secondary | ICD-10-CM | POA: Insufficient documentation

## 2021-07-05 DIAGNOSIS — R400 Somnolence: Secondary | ICD-10-CM | POA: Insufficient documentation

## 2021-07-05 DIAGNOSIS — I480 Paroxysmal atrial fibrillation: Secondary | ICD-10-CM | POA: Diagnosis not present

## 2021-07-05 DIAGNOSIS — I10 Essential (primary) hypertension: Secondary | ICD-10-CM | POA: Insufficient documentation

## 2021-07-05 DIAGNOSIS — Z7982 Long term (current) use of aspirin: Secondary | ICD-10-CM | POA: Insufficient documentation

## 2021-07-05 DIAGNOSIS — Z09 Encounter for follow-up examination after completed treatment for conditions other than malignant neoplasm: Secondary | ICD-10-CM | POA: Diagnosis not present

## 2021-07-05 DIAGNOSIS — R0683 Snoring: Secondary | ICD-10-CM | POA: Diagnosis not present

## 2021-07-05 DIAGNOSIS — D6869 Other thrombophilia: Secondary | ICD-10-CM | POA: Diagnosis not present

## 2021-07-05 DIAGNOSIS — I484 Atypical atrial flutter: Secondary | ICD-10-CM | POA: Insufficient documentation

## 2021-07-05 DIAGNOSIS — E669 Obesity, unspecified: Secondary | ICD-10-CM | POA: Diagnosis not present

## 2021-07-05 DIAGNOSIS — Z87891 Personal history of nicotine dependence: Secondary | ICD-10-CM | POA: Insufficient documentation

## 2021-07-05 DIAGNOSIS — Z79899 Other long term (current) drug therapy: Secondary | ICD-10-CM | POA: Diagnosis not present

## 2021-07-05 NOTE — Telephone Encounter (Signed)
Every medication that increase serotonin levels (SSRI,SNRI) can increase the risk of bleeding when taking with fish oil,NSAID's,aspirin,and blood thinners.  Prozac and Flecainide can also increase the risk of arrhythmias. Hydroxyzine 25 mg can be taken as needed, 2-3 times per day for acute anxiety. It is safer in regard to medication interaction, it causes drowsiness. She can try Hydroxyzine 25 mg tid prn. In regard to pain, she can take Tylenol (Acetaminophen).   It seems like she takes 1 tab daily of hydrocodone-Acetaminophen 5-325 mg, so she can take Tylenol 500 mg 3 times per day if needed. Thanks, BJ

## 2021-07-05 NOTE — Telephone Encounter (Signed)
Patient called because she went to the Cardiologist and was told the FLUoxetine (PROZAC) 10 MG capsule shouldn't be taken with flecainide (TAMBOCOR) 100 MG tablet and ELIQUIS 5 MG TABS tablet because the Prozac may cause bleeding. Cardiologist recommended hydrOXYzine (ATARAX/VISTARIL) 25 MG tablet as an anxiety medication because she already has a prescription for it and it can be used for that. Patient also wants to know what can be taken for general pain/headaches because cardiologist said not to take Asprin, but she is on hydrocodone and can't take other options with that.      Good callback number is [phone]

## 2021-07-05 NOTE — Progress Notes (Signed)
Primary Care Physician: [name], [name], MD Primary Cardiologist: Dr Acie Fredrickson Primary Electrophysiologist: none Referring Physician: Dr Doristine Mango [name] is a 70 y.o. female with a history of HTN, HLD, and atrial fibrillation who presents for follow up in the [facility].  The patient was initially diagnosed with atrial fibrillation 04/2020 after presenting to the her PCP office with palpitations. She also wore a 30 day event monitor which showed a 7% afib burden. Patient is on Eliquis for a CHADS2VASC score of 4. Patient reports that recently she has had more episodes of palpitations at rest. She was changed to metoprolol but this did not control her heart rate as well although she was on prednisone at the time.  On follow up today, patient brings in a record of her heart rates and BP readings from home. She appears to mostly be in SR with heart rates in the 50s-60s, does occasionally note rates 100-110. Today, she was in atrial flutter on initial ECG but converted to sinus bradycardia. She admits to snoring and daytime somnolence.   Today, she denies symptoms of chest pain, orthopnea, PND, lower extremity edema, dizziness, presyncope, syncope, bleeding, or neurologic sequela. The patient is tolerating medications without difficulties and is otherwise without complaint today.    Atrial Fibrillation Risk Factors:  she does have symptoms or diagnosis of sleep apnea. She declines sleep study. she does not have a history of rheumatic fever. she does not have a history of alcohol use.   she has a BMI of There is no height or weight on file to calculate BMI.. There were no vitals filed for this visit.    Family History  Problem Relation Age of Onset   Hypertension Mother    Hyperlipidemia Mother    Hyperlipidemia Father    Hypertension Father    Arthritis Father    Hyperlipidemia Sister    Cervical cancer Sister    Diabetes Sister    Heart  disease Sister    Cancer Maternal Grandfather    Hypertension Paternal Grandmother    Pancreatic cancer Maternal Aunt    Breast cancer Paternal Aunt    Breast cancer Cousin      Atrial Fibrillation Management history:  Previous antiarrhythmic drugs: flecainide  Previous cardioversions: none Previous ablations: none CHADS2VASC score: 4 Anticoagulation history: Eliquis   Past Medical History:  Diagnosis Date   Abdominal pain, epigastric 12/28/2008   Centricity Description: ABDOMINAL PAIN, EPIGASTRIC Qualifier: Diagnosis of  By: Julaine Hua CMA (AAMA), [name]   Centricity Description: ABDOMINAL PAIN-EPIGASTRIC Qualifier: Diagnosis of  By: [name], [name]    Allergic rhinitis 09/16/2013   Allergy    Anemia    Anxiety    Anxiety disorder, unspecified 07/06/2017   Arthritis    Blood in stool    BMI 45.0-49.9, adult ([place]) 07/26/2016   Class 3 obesity with serious comorbidity and body mass index (BMI) of 45.0 to 49.9 in adult 03/07/2017   DDD (degenerative disc disease), lumbar    DIVERTICULOSIS-COLON 12/28/2008   Qualifier: Diagnosis of  By: [name], [name]    GERD 12/28/2008   Qualifier: Diagnosis of  By: [name], [name]    GERD (gastroesophageal reflux disease)    Headache    Hyperglycemia 03/17/2014   Hypertension    Hypokalemia 07/26/2016   IBS (irritable bowel syndrome)-C-D 07/26/2016   Obesity    Other and unspecified hyperlipidemia 03/17/2014   PERSONAL HX COLONIC POLYPS 12/28/2008  Qualifier: Diagnosis of  By: [name], [name]    Reflux esophagitis 12/28/2008   Qualifier: Diagnosis of  By: [name], [name]     S/P lumbar spinal fusion 07/02/2012   Tubulovillous adenoma of colon 2008   Past Surgical History:  Procedure Laterality Date   ABDOMINAL HYSTERECTOMY     COLONOSCOPY  2014   laser surgery to remove scar     not sure of the year; after hyst   RADIOLOGY WITH ANESTHESIA N/A 02/25/2015   Procedure: MRI OF LUMBAR SPINE;  Surgeon:  Medication Radiologist, MD;  Location: [place];  Service: Radiology;  Laterality: N/A;  DR. Bennie Pierini COHEN/MRI   SPINE SURGERY     TONSILLECTOMY AND ADENOIDECTOMY      Current Outpatient Medications  Medication Sig Dispense Refill   amLODipine (NORVASC) 5 MG tablet Take 1 tablet by mouth once daily 90 tablet 2   Aspirin-Salicylamide-Caffeine (BC FAST PAIN RELIEF) 650-195-33.3 MG PACK Take 1 tablet by mouth as needed.     atenolol (TENORMIN) 25 MG tablet Take 0.5 tablets (12.5 mg total) by mouth daily. May take an extra 1/2 tablet for breakthrough afib 120 tablet 3   azelastine (ASTELIN) 0.1 % nasal spray Place 2 sprays into both nostrils 2 (two) times daily. Use in each nostril as directed 30 mL 11   calcium carbonate (TUMS EX) 750 MG chewable tablet Chew 2 tablets by mouth daily as needed for heartburn.     cetirizine (ZYRTEC) 10 MG tablet Take 10 mg by mouth daily. Alternating with Allegra     cyclobenzaprine (FLEXERIL) 10 MG tablet Take 2.5 mg by mouth daily as needed (pain level 8 or greater).      diclofenac sodium (VOLTAREN) 1 % GEL APPLY (4G) BY TOPICAL ROUTE 4 TIMES EVERY DAY TO THE AFFECTED AREA(S) (WC PA)  1   ELIQUIS 5 MG TABS tablet Take 1 tablet by mouth twice daily 180 tablet 2   flecainide (TAMBOCOR) 100 MG tablet Take 1 tablet (100 mg total) by mouth 2 (two) times daily. 60 tablet 2   FLUoxetine (PROZAC) 10 MG capsule Take 1 capsule (10 mg total) by mouth daily. 90 capsule 1   fluticasone (FLONASE) 50 MCG/ACT nasal spray Use 2 spray(s) in each nostril once daily 48 g 0   guaiFENesin (MUCINEX) 600 MG 12 hr tablet Take 300 mg by mouth as needed.      hydrochlorothiazide (HYDRODIURIL) 25 MG tablet TAKE 1 TABLET BY MOUTH ONCE DAILY (DUE  FOR  FOLLOW  UP) 90 tablet 0   HYDROcodone-acetaminophen (NORCO/VICODIN) 5-325 MG tablet Take 0.5 tablets by mouth daily as needed (pain level 8 or greater). 30 tablet 0   hydrocortisone 2.5 % ointment APPLY TWICE A DAY 60 g 11   hydrOXYzine  (ATARAX/VISTARIL) 25 MG tablet Take 1 tablet (25 mg total) by mouth daily as needed for itching. (Patient not taking: Reported on 06/29/2021) 30 tablet 0   Multiple Vitamins-Minerals (MULTI-VITAMIN GUMMIES PO) Take by mouth as needed.     omeprazole (PRILOSEC) 40 MG capsule Take 1 capsule (40 mg total) by mouth daily. 60 capsule 11   Polyethylene Glycol 3350 (MIRALAX PO) Take by mouth as needed.     potassium chloride (KLOR-CON) 10 MEQ tablet Take 2 tablets by mouth once daily 180 tablet 2   pravastatin (PRAVACHOL) 20 MG tablet Take by mouth twice a week 27 tablet 3   trolamine salicylate (ASPERCREME) 10 % cream Apply 1 application topically as needed for muscle  pain.     No current facility-administered medications for this encounter.    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   Social Determinants of Health    Financial Resource Strain: Low Risk    Difficulty of Paying Living Expenses: Not hard at all  Food Insecurity: No Food Insecurity   Worried About Charity fundraiser in the Last Year: Never true   [place] in the Last Year: Never true  Transportation Needs: No Transportation Needs   Lack of Transportation (Medical): No   Lack of Transportation (Non-Medical): No  Physical Activity: Inactive   Days of Exercise per Week: 0 days   Minutes of Exercise per Session: 0 min  Stress: No Stress Concern Present   Feeling of Stress : Only a little  Social Connections: Moderately Isolated   Frequency of Communication with Friends and Family: More than three times a week   Frequency of Social Gatherings with Friends and Family: Never   Attends Religious Services: 1 to 4 times per year   Active Member of [employer] or Organizations: No   Attends Archivist Meetings: Never   Marital Status: Widowed  Human resources officer Violence: Not At Risk   Fear of Current or Ex-Partner: No   Emotionally Abused: No   Physically Abused: No   Sexually Abused: No     ROS- All systems are reviewed and negative except as per the HPI above.  Physical Exam: Vitals:   07/05/21 0955  Pulse: (!) 48    GEN- The patient is a well appearing obese female, alert and oriented x 3 today.   HEENT-head normocephalic, atraumatic, sclera clear, conjunctiva pink, hearing intact, trachea midline. Lungs- Clear to ausculation bilaterally, normal work of breathing Heart- Regular rate and rhythm, bradycardia, no murmurs, rubs or gallops  GI- soft, NT, ND, + BS Extremities- no clubbing, cyanosis, or edema MS- no significant deformity or atrophy Skin- no rash or lesion Psych- euthymic mood, full affect Neuro- strength and sensation are intact   Wt Readings from Last 3 Encounters:  06/29/21 110.4 kg  06/06/21 110.9 kg  05/17/21  111.4 kg    EKG today demonstrates  Initially showed atypical atrial flutter with  variable block HR 98. Second ECG showed SB Vent. rate 48 BPM PR interval 182 ms QRS duration 96 ms QT/QTcB 492/439 ms  Echo 05/11/20 demonstrated   1. Left ventricular ejection fraction, by estimation, is 60 to 65%. The left ventricle has normal function. The left ventricle has no regional wall motion abnormalities. Left ventricular diastolic parameters were normal.   2. Right ventricular systolic function is normal. The right ventricular  size is normal. There is normal pulmonary artery systolic pressure.   3. The mitral valve is normal in structure. Trivial mitral valve  regurgitation. No evidence of mitral stenosis.   4. The aortic valve is tricuspid. Aortic valve regurgitation is not  visualized. Mild aortic valve sclerosis is present, with no evidence of aortic valve stenosis.   5. The inferior vena cava is normal in size with greater than 50%  respiratory variability, suggesting right atrial pressure of 3 mmHg.   Epic records are reviewed at length today  CHA2DS2-VASc Score = 4  The patient's score is based upon: CHF History: 0 HTN History: 1 Diabetes History: 1 Stroke History: 0 Vascular Disease History: 0 Age Score: 1 Gender Score: 1      ASSESSMENT AND PLAN: 1. Paroxysmal Atrial Fibrillation/atypical atrial flutter The patient's CHA2DS2-VASc score is 4, indicating a 4.8% annual risk of stroke.   We discussed rhythm control options today including changing AAD (Multaq, dofetilide). Patient would like to continue on the flecainide longer since she has noted some improvement.  Continue flecainide 100 mg BID Continue Eliquis 5 mg BID Continue atenolol 12.5 mg daily with an extra 12.5 mg for heart racing.  2. Secondary Hypercoagulable State (ICD10:  D68.69) The patient is at significant risk for stroke/thromboembolism based upon her CHA2DS2-VASc Score of 4.  Continue Apixaban (Eliquis).   3. Obesity Lifestyle modification was discussed and encouraged including regular  physical activity and weight reduction.  4. HTN Stable, no changes today.  5. Snoring/daytime somnolence The importance of adequate treatment of sleep apnea was discussed today in order to improve our ability to maintain sinus rhythm long term. Suspect she has OSA. She continues to decline testing at this time.    Follow up in the AF clinic in one month.    [facility] [address] [place], [place] [address] [phone] 07/05/2021 9:59 AM

## 2021-07-06 MED ORDER — HYDROXYZINE HCL 25 MG PO TABS: 25.0000 mg | ORAL_TABLET | Freq: Three times a day (TID) | ORAL | 3 refills | Status: DC | PRN

## 2021-07-06 NOTE — Telephone Encounter (Signed)
I called and spoke with patient. We went over the information below, she verbalized understanding. New Rx sent in for Hydroxyzine.

## 2021-07-25 ENCOUNTER — Other Ambulatory Visit: Payer: Self-pay | Admitting: Family Medicine

## 2021-07-25 DIAGNOSIS — E876 Hypokalemia: Secondary | ICD-10-CM

## 2021-07-27 ENCOUNTER — Other Ambulatory Visit: Payer: Self-pay

## 2021-08-04 ENCOUNTER — Ambulatory Visit (HOSPITAL_COMMUNITY)
Admission: RE | Admit: 2021-08-04 | Discharge: 2021-08-04 | Disposition: A | Discharge status: Home / Self Care | Payer: Medicare PPO | Source: Ambulatory Visit | Attending: Physician Assistant | Admitting: Physician Assistant

## 2021-08-04 ENCOUNTER — Other Ambulatory Visit: Payer: Self-pay

## 2021-08-04 ENCOUNTER — Encounter (HOSPITAL_COMMUNITY): Payer: Self-pay | Admitting: Physician Assistant

## 2021-08-04 VITALS — BP 122/82 | HR 57 | Ht 61.0 in | Wt 245.0 lb

## 2021-08-04 DIAGNOSIS — Z7901 Long term (current) use of anticoagulants: Secondary | ICD-10-CM | POA: Insufficient documentation

## 2021-08-04 DIAGNOSIS — E785 Hyperlipidemia, unspecified: Secondary | ICD-10-CM | POA: Insufficient documentation

## 2021-08-04 DIAGNOSIS — Z888 Allergy status to other drugs, medicaments and biological substances status: Secondary | ICD-10-CM | POA: Diagnosis not present

## 2021-08-04 DIAGNOSIS — E669 Obesity, unspecified: Secondary | ICD-10-CM | POA: Diagnosis not present

## 2021-08-04 DIAGNOSIS — Z79899 Other long term (current) drug therapy: Secondary | ICD-10-CM | POA: Diagnosis not present

## 2021-08-04 DIAGNOSIS — I10 Essential (primary) hypertension: Secondary | ICD-10-CM | POA: Diagnosis not present

## 2021-08-04 DIAGNOSIS — Z6841 Body Mass Index (BMI) 40.0 and over, adult: Secondary | ICD-10-CM | POA: Diagnosis not present

## 2021-08-04 DIAGNOSIS — I484 Atypical atrial flutter: Secondary | ICD-10-CM | POA: Diagnosis not present

## 2021-08-04 DIAGNOSIS — Z8249 Family history of ischemic heart disease and other diseases of the circulatory system: Secondary | ICD-10-CM | POA: Insufficient documentation

## 2021-08-04 DIAGNOSIS — Z881 Allergy status to other antibiotic agents status: Secondary | ICD-10-CM | POA: Diagnosis not present

## 2021-08-04 DIAGNOSIS — Z882 Allergy status to sulfonamides status: Secondary | ICD-10-CM | POA: Insufficient documentation

## 2021-08-04 DIAGNOSIS — I480 Paroxysmal atrial fibrillation: Secondary | ICD-10-CM | POA: Insufficient documentation

## 2021-08-04 DIAGNOSIS — D6869 Other thrombophilia: Secondary | ICD-10-CM | POA: Insufficient documentation

## 2021-08-04 DIAGNOSIS — R0683 Snoring: Secondary | ICD-10-CM | POA: Diagnosis not present

## 2021-08-04 DIAGNOSIS — Z87891 Personal history of nicotine dependence: Secondary | ICD-10-CM | POA: Insufficient documentation

## 2021-08-04 NOTE — Progress Notes (Signed)
Primary Care Physician: [name], [name], MD Primary Cardiologist: Dr Acie Fredrickson Primary Electrophysiologist: none Referring Physician: Dr Doristine Mango [name] is a 70 y.o. female with a history of HTN, HLD, and atrial fibrillation who presents for follow up in the [facility].  The patient was initially diagnosed with atrial fibrillation 04/2020 after presenting to the her PCP office with palpitations. She also wore a 30 day event monitor which showed a 7% afib burden. Patient is on Eliquis for a CHADS2VASC score of 4. Patient reports that recently she has had more episodes of palpitations at rest. She was changed to metoprolol but this did not control her heart rate as well although she was on prednisone at the time. She admits to snoring and daytime somnolence.   On follow up today, patient reports she has done well since her last visit. She only had one episode of afib which was very brief. She is very happy with her improvement on flecainide. She denies any bleeding issues on anticoagulation. She is very stressed caring for her aging mother.   Today, she denies symptoms of palpitations, chest pain, orthopnea, PND, dizziness, presyncope, syncope, bleeding, or neurologic sequela. The patient is tolerating medications without difficulties and is otherwise without complaint today.    Atrial Fibrillation Risk Factors:  she does have symptoms or diagnosis of sleep apnea. She declines sleep study. she does not have a history of rheumatic fever. she does not have a history of alcohol use.   she has a BMI of Body mass index is 46.29 kg/m.Marland Kitchen Filed Weights   08/04/21 0908  Weight: 111.1 kg      Family History  Problem Relation Age of Onset   Hypertension Mother    Hyperlipidemia Mother    Hyperlipidemia Father    Hypertension Father    Arthritis Father    Hyperlipidemia Sister    Cervical cancer Sister    Diabetes Sister    Heart disease Sister     Cancer Maternal Grandfather    Hypertension Paternal Grandmother    Pancreatic cancer Maternal Aunt    Breast cancer Paternal Aunt    Breast cancer Cousin      Atrial Fibrillation Management history:  Previous antiarrhythmic drugs: flecainide  Previous cardioversions: none Previous ablations: none CHADS2VASC score: 4 Anticoagulation history: Eliquis   Past Medical History:  Diagnosis Date   Abdominal pain, epigastric 12/28/2008   Centricity Description: ABDOMINAL PAIN, EPIGASTRIC Qualifier: Diagnosis of  By: Julaine Hua CMA (AAMA), [name]   Centricity Description: ABDOMINAL PAIN-EPIGASTRIC Qualifier: Diagnosis of  By: [name], [name]    Allergic rhinitis 09/16/2013   Allergy    Anemia    Anxiety    Anxiety disorder, unspecified 07/06/2017   Arthritis    Blood in stool    BMI 45.0-49.9, adult ([place]) 07/26/2016   Class 3 obesity with serious comorbidity and body mass index (BMI) of 45.0 to 49.9 in adult 03/07/2017   DDD (degenerative disc disease), lumbar    DIVERTICULOSIS-COLON 12/28/2008   Qualifier: Diagnosis of  By: [name], [name]    GERD 12/28/2008   Qualifier: Diagnosis of  By: [name], [name]    GERD (gastroesophageal reflux disease)    Headache    Hyperglycemia 03/17/2014   Hypertension    Hypokalemia 07/26/2016   IBS (irritable bowel syndrome)-C-D 07/26/2016   Obesity    Other and unspecified hyperlipidemia 03/17/2014   PERSONAL HX COLONIC POLYPS 12/28/2008   Qualifier: Diagnosis  of  By: [name], [name]    Reflux esophagitis 12/28/2008   Qualifier: Diagnosis of  By: [name], [name]     S/P lumbar spinal fusion 07/02/2012   Tubulovillous adenoma of colon 2008   Past Surgical History:  Procedure Laterality Date   ABDOMINAL HYSTERECTOMY     COLONOSCOPY  2014   laser surgery to remove scar     not sure of the year; after hyst   RADIOLOGY WITH ANESTHESIA N/A 02/25/2015   Procedure: MRI OF LUMBAR SPINE;  Surgeon: Medication Radiologist,  MD;  Location: [place];  Service: Radiology;  Laterality: N/A;  DR. Bennie Pierini COHEN/MRI   SPINE SURGERY     TONSILLECTOMY AND ADENOIDECTOMY      Current Outpatient Medications  Medication Sig Dispense Refill   amLODipine (NORVASC) 5 MG tablet Take 1 tablet by mouth once daily 90 tablet 2   Aspirin-Salicylamide-Caffeine (BC FAST PAIN RELIEF) 650-195-33.3 MG PACK Take 1 tablet by mouth as needed.     atenolol (TENORMIN) 25 MG tablet Take 0.5 tablets (12.5 mg total) by mouth daily. May take an extra 1/2 tablet for breakthrough afib 120 tablet 3   azelastine (ASTELIN) 0.1 % nasal spray Place 2 sprays into both nostrils 2 (two) times daily. Use in each nostril as directed 30 mL 11   calcium carbonate (TUMS EX) 750 MG chewable tablet Chew 2 tablets by mouth daily as needed for heartburn.     cetirizine (ZYRTEC) 10 MG tablet Take 10 mg by mouth daily. Alternating with Allegra     cyclobenzaprine (FLEXERIL) 10 MG tablet Take 2.5 mg by mouth daily as needed (pain level 8 or greater).      diclofenac sodium (VOLTAREN) 1 % GEL APPLY (4G) BY TOPICAL ROUTE 4 TIMES EVERY DAY TO THE AFFECTED AREA(S) (WC PA)  1   ELIQUIS 5 MG TABS tablet Take 1 tablet by mouth twice daily 180 tablet 2   flecainide (TAMBOCOR) 100 MG tablet Take 1 tablet (100 mg total) by mouth 2 (two) times daily. 60 tablet 2   fluticasone (FLONASE) 50 MCG/ACT nasal spray Use 2 spray(s) in each nostril once daily 48 g 0   guaiFENesin (MUCINEX) 600 MG 12 hr tablet Take 300 mg by mouth as needed.      hydrochlorothiazide (HYDRODIURIL) 25 MG tablet TAKE 1 TABLET BY MOUTH ONCE DAILY (DUE  FOR  FOLLOW  UP) 90 tablet 0   HYDROcodone-acetaminophen (NORCO/VICODIN) 5-325 MG tablet Take 0.5 tablets by mouth daily as needed (pain level 8 or greater). 30 tablet 0   hydrocortisone 2.5 % ointment APPLY TWICE A DAY 60 g 11   hydrOXYzine (ATARAX/VISTARIL) 25 MG tablet Take 1 tablet (25 mg total) by mouth 3 (three) times daily as needed for anxiety. 90 tablet 3    Multiple Vitamins-Minerals (MULTI-VITAMIN GUMMIES PO) Take by mouth as needed.     omeprazole (PRILOSEC) 40 MG capsule Take 1 capsule (40 mg total) by mouth daily. 60 capsule 11   Polyethylene Glycol 3350 (MIRALAX PO) Take by mouth as needed.     potassium chloride (KLOR-CON) 10 MEQ tablet Take 2 tablets by mouth once daily 180 tablet 1   pravastatin (PRAVACHOL) 20 MG tablet Take by mouth twice a week 27 tablet 3   trolamine salicylate (ASPERCREME) 10 % cream Apply 1 application topically as needed for muscle pain.     No current facility-administered medications for this encounter.    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   Social Determinants of Health   Financial Resource Strain: Not on file  Food Insecurity: Not on file  Transportation Needs: Not on file  Physical Activity: Not on file  Stress: Not on  file  Social Connections: Not on file  Intimate Partner Violence: Not on file     ROS- All systems are reviewed and negative except as per the HPI above.  Physical Exam: Vitals:   08/04/21 0908  BP: 122/82  Pulse: (!) 57  Weight: 111.1 kg  Height: 5\' 1"  (1.549 m)    GEN- The patient is a well appearing obese female, alert and oriented x 3 today.   HEENT-head normocephalic, atraumatic, sclera clear, conjunctiva pink, hearing intact, trachea midline. Lungs- Clear to ausculation bilaterally, normal work of breathing Heart- Regular rate and rhythm, no murmurs, rubs or gallops  GI- soft, NT, ND, + BS Extremities- no clubbing, cyanosis, or edema MS- no significant deformity or atrophy Skin- no rash or lesion Psych- euthymic mood, full affect Neuro- strength and sensation are intact   Wt Readings from Last 3 Encounters:  08/04/21 111.1 kg  06/29/21 110.4 kg  06/06/21 110.9 kg    EKG today demonstrates  SB Vent. rate 57 BPM PR interval 188 ms QRS duration 100 ms QT/QTcB 440/428 ms  Echo 05/11/20 demonstrated   1. Left ventricular ejection fraction, by estimation, is 60 to 65%. The left ventricle has normal function. The left ventricle has no regional wall motion abnormalities. Left ventricular diastolic parameters were normal.   2. Right ventricular systolic function is normal. The right ventricular  size is normal. There is normal pulmonary artery systolic pressure.   3. The mitral valve is normal in structure. Trivial mitral valve  regurgitation. No evidence of mitral stenosis.   4. The aortic valve is tricuspid. Aortic valve regurgitation is not  visualized. Mild aortic valve sclerosis is present, with no evidence of aortic valve stenosis.   5. The inferior vena cava is normal in size with greater than 50%  respiratory variability, suggesting right atrial pressure of 3 mmHg.   Epic records are reviewed at length today  CHA2DS2-VASc Score =  4  The patient's score is  based upon: CHF History: 0 HTN History: 1 Diabetes History: 1 Stroke History: 0 Vascular Disease History: 0 Age Score: 1 Gender Score: 1         ASSESSMENT AND PLAN: 1. Paroxysmal Atrial Fibrillation/atypical atrial flutter The patient's CHA2DS2-VASc score is 4, indicating a 4.8% annual risk of stroke.   Patient appears to be maintaining SR.  Continue flecainide 100 mg BID Continue Eliquis 5 mg BID Continue atenolol 12.5 mg daily with an extra 12.5 mg for heart racing.  2. Secondary Hypercoagulable State (ICD10:  D68.69) The patient is at significant risk for stroke/thromboembolism based upon her CHA2DS2-VASc Score of 4.  Continue Apixaban (Eliquis).   3. Obesity Lifestyle modification was discussed and encouraged including regular physical activity and weight reduction.  4. HTN Stable, no changes today.  5. Snoring/daytime somnolence Suspect she has OSA. She continues to decline testing at this time.    Follow up in the AF clinic in 3 months.    [facility] [address] [place], [place] [address] [phone] 08/04/2021 9:16 AM

## 2021-08-10 ENCOUNTER — Other Ambulatory Visit: Payer: Self-pay

## 2021-08-10 ENCOUNTER — Ambulatory Visit (INDEPENDENT_AMBULATORY_CARE_PROVIDER_SITE_OTHER): Payer: Medicare PPO

## 2021-08-10 DIAGNOSIS — Z0000 Encounter for general adult medical examination without abnormal findings: Secondary | ICD-10-CM | POA: Diagnosis not present

## 2021-08-10 NOTE — Progress Notes (Signed)
Virtual Visit via Telephone Note  I connected with  [name] on 08/10/21 at 10:15 AM EDT by telephone and verified that I am speaking with the correct person using two identifiers.  Medicare Annual Wellness visit completed telephonically due to Covid-19 pandemic.   Persons participating in this call: This Health Coach and this patient.   Location: Patient: Home Provider: Office   I discussed the limitations, risks, security and privacy concerns of performing an evaluation and management service by telephone and the availability of in person appointments. The patient expressed understanding and agreed to proceed.  Unable to perform video visit due to video visit attempted and failed and/or patient does not have video capability.   Some vital signs may be absent or patient reported.   Willette Brace, LPN   Subjective:   [name] is a 70 y.o. female who presents for Medicare Annual (Subsequent) preventive examination.  Review of Systems     Cardiac Risk Factors include: advanced age (>58men, >10 women);hypertension;diabetes mellitus;dyslipidemia;obesity (BMI >30kg/m2)     Objective:    Today's Vitals   08/10/21 1022  PainSc: 8    There is no height or weight on file to calculate BMI.  Advanced Directives 07/20/2020 02/01/2018 04/27/2017 02/22/2015  Does Patient Have a Medical Advance Directive? No No No No  Would patient like information on creating a medical advance directive? No - Patient declined - - No - patient declined information    Current Medications (verified) Outpatient Encounter Medications as of 08/10/2021  Medication Sig   amLODipine (NORVASC) 5 MG tablet Take 1 tablet by mouth once daily   Aspirin-Salicylamide-Caffeine (BC FAST PAIN RELIEF) 650-195-33.3 MG PACK Take 1 tablet by mouth as needed.   atenolol (TENORMIN) 25 MG tablet Take 0.5 tablets (12.5 mg total) by mouth daily. May take an extra 1/2 tablet for breakthrough afib    azelastine (ASTELIN) 0.1 % nasal spray Place 2 sprays into both nostrils 2 (two) times daily. Use in each nostril as directed   cetirizine (ZYRTEC) 10 MG tablet Take 10 mg by mouth daily. Alternating with Allegra   cyclobenzaprine (FLEXERIL) 10 MG tablet Take 2.5 mg by mouth daily as needed (pain level 8 or greater).    diclofenac sodium (VOLTAREN) 1 % GEL APPLY (4G) BY TOPICAL ROUTE 4 TIMES EVERY DAY TO THE AFFECTED AREA(S) (WC PA)   ELIQUIS 5 MG TABS tablet Take 1 tablet by mouth twice daily   flecainide (TAMBOCOR) 100 MG tablet Take 1 tablet (100 mg total) by mouth 2 (two) times daily.   fluticasone (FLONASE) 50 MCG/ACT nasal spray Use 2 spray(s) in each nostril once daily   guaiFENesin (MUCINEX) 600 MG 12 hr tablet Take 300 mg by mouth as needed.    hydrochlorothiazide (HYDRODIURIL) 25 MG tablet TAKE 1 TABLET BY MOUTH ONCE DAILY (DUE  FOR  FOLLOW  UP)   HYDROcodone-acetaminophen (NORCO/VICODIN) 5-325 MG tablet Take 0.5 tablets by mouth daily as needed (pain level 8 or greater).   hydrocortisone 2.5 % ointment APPLY TWICE A DAY   hydrOXYzine (ATARAX/VISTARIL) 25 MG tablet Take 1 tablet (25 mg total) by mouth 3 (three) times daily as needed for anxiety.   Multiple Vitamins-Minerals (MULTI-VITAMIN GUMMIES PO) Take by mouth as needed.   omeprazole (PRILOSEC) 40 MG capsule Take 1 capsule (40 mg total) by mouth daily.   Polyethylene Glycol 3350 (MIRALAX PO) Take by mouth as needed.   potassium chloride (KLOR-CON) 10 MEQ tablet Take 2 tablets by mouth once  daily   pravastatin (PRAVACHOL) 20 MG tablet Take by mouth twice a week   trolamine salicylate (ASPERCREME) 10 % cream Apply 1 application topically as needed for muscle pain.   calcium carbonate (TUMS EX) 750 MG chewable tablet Chew 2 tablets by mouth daily as needed for heartburn. (Patient not taking: Reported on 08/10/2021)   No facility-administered encounter medications on file as of 08/10/2021.    Allergies (verified) Crestor  [rosuvastatin], Erythromycin, Lansoprazole, Penicillins, Potassium-containing compounds, Robaxin [methocarbamol], Sulfonamide derivatives, and Trental [pentoxifylline er]   History: Past Medical History:  Diagnosis Date   Abdominal pain, epigastric 12/28/2008   Centricity Description: ABDOMINAL PAIN, EPIGASTRIC Qualifier: Diagnosis of  By: Julaine Hua CMA (AAMA), [name]   Centricity Description: ABDOMINAL PAIN-EPIGASTRIC Qualifier: Diagnosis of  By: [name], [name]    Allergic rhinitis 09/16/2013   Allergy    Anemia    Anxiety    Anxiety disorder, unspecified 07/06/2017   Arthritis    Blood in stool    BMI 45.0-49.9, adult ([place]) 07/26/2016   Class 3 obesity with serious comorbidity and body mass index (BMI) of 45.0 to 49.9 in adult 03/07/2017   DDD (degenerative disc disease), lumbar    DIVERTICULOSIS-COLON 12/28/2008   Qualifier: Diagnosis of  By: [name], [name]    GERD 12/28/2008   Qualifier: Diagnosis of  By: [name], [name]    GERD (gastroesophageal reflux disease)    Headache    Hyperglycemia 03/17/2014   Hypertension    Hypokalemia 07/26/2016   IBS (irritable bowel syndrome)-C-D 07/26/2016   Obesity    Other and unspecified hyperlipidemia 03/17/2014   PERSONAL HX COLONIC POLYPS 12/28/2008   Qualifier: Diagnosis of  By: [name], [name]    Reflux esophagitis 12/28/2008   Qualifier: Diagnosis of  By: [name], [name]     S/P lumbar spinal fusion 07/02/2012   Tubulovillous adenoma of colon 2008   Past Surgical History:  Procedure Laterality Date   ABDOMINAL HYSTERECTOMY     COLONOSCOPY  2014   laser surgery to remove scar     not sure of the year; after hyst   RADIOLOGY WITH ANESTHESIA N/A 02/25/2015   Procedure: MRI OF LUMBAR SPINE;  Surgeon: Medication Radiologist, MD;  Location: [facility];  Service: Radiology;  Laterality: N/A;  DR. Bennie Pierini COHEN/MRI   SPINE SURGERY     TONSILLECTOMY AND ADENOIDECTOMY     Family History  Problem Relation Age of Onset    Hypertension Mother    Hyperlipidemia Mother    Hyperlipidemia Father    Hypertension Father    Arthritis Father    Hyperlipidemia Sister    Cervical cancer Sister    Diabetes Sister    Heart disease Sister    Cancer Maternal Grandfather    Hypertension Paternal Grandmother    Pancreatic cancer Maternal Aunt    Breast cancer Paternal Aunt    Breast cancer Cousin    Social History   Socioeconomic History   Marital status: Widowed    Spouse name: Not on file   Number of children: Not on file   Years of education: Not on file   Highest education level: Not on file  Occupational History   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   Social Determinants of Health   Financial Resource Strain: Low Risk    Difficulty of Paying Living Expenses: Not hard at all  Food Insecurity: No Food Insecurity   Worried About Charity fundraiser in the Last Year: Never true   [place] in the Last Year: Never true  Transportation Needs: No Transportation Needs   Lack of Transportation (Medical): No   Lack of Transportation (Non-Medical): No  Physical Activity: Inactive   Days of Exercise per Week: 0 days   Minutes of Exercise per Session: 0 min  Stress: No Stress Concern Present   Feeling of Stress : Only a little  Social Connections: Moderately Isolated   Frequency of Communication with Friends and Family: Twice a week   Frequency of Social Gatherings with Friends and Family: Once a week   Attends  Religious Services: 1 to 4 times per year   Active Member of [employer] or Organizations: No   Attends Archivist Meetings: Never   Marital Status: Widowed    Tobacco Counseling Counseling given: Not Answered Tobacco comments: Former smoker 06/06/21   Clinical Intake:  Pre-visit preparation completed: Yes  Pain : 0-10 Pain Score: 8  Pain Type: Chronic pain Pain Descriptors / Indicators: Dull, Sharp Pain Onset: More than a month ago Pain Frequency: Intermittent     BMI - recorded: 46.32 Nutritional Status: BMI > 30  Obese Nutritional Risks: None Diabetes: Yes CBG done?: No Did pt. bring in CBG monitor from home?: No  How often do you need to have someone help you when you read instructions, pamphlets, or other written materials from your doctor or pharmacy?: 1 - Never  Diabetic?Nutrition Risk Assessment:  Has the patient had any N/V/D within the last 2 months?  No  Does the patient have any non-healing wounds?  No  Has the patient had any unintentional weight loss or weight gain?  No   Diabetes:  Is the patient diabetic?  Yes  If diabetic, was a CBG obtained today?  No  Did the patient bring in their glucometer from home?  No  How often do you monitor your CBG's? N/A.   Financial Strains and Diabetes Management:  Are you having any financial strains with the device, your supplies or your medication? No .  Does the patient want to be seen by Chronic Care Management for management of their diabetes?  No  Would the patient like to be referred to a Nutritionist or for Diabetic Management?  No   Diabetic Exams:  Diabetic Eye Exam: Completed 06/27/21 Diabetic Foot Exam: Completed 12/10/20   Interpreter Needed?: No  Information entered by :: Charlott Rakes, LPN   Activities of Daily Living In your present state of health, do you have any difficulty performing the following activities: 08/10/2021  Hearing? N  Vision? N  Difficulty concentrating or making  decisions? N  Walking or climbing stairs? N  Dressing or bathing? N  Doing errands, shopping? N  Preparing Food and eating ? N  Using the Toilet? N  In the past six months, have you accidently leaked urine? Darreld Mclean  Comment leakage at times  Do you have problems with loss of bowel control? N  Managing your Medications? N  Managing your Finances? N  Housekeeping or managing your Housekeeping? N  Some recent data might be hidden    Patient Care Team: [name], [name], MD as PCP - General (Family Medicine) Nahser, Wonda Cheng, MD as PCP - Cardiology (Cardiology)  Indicate any recent Medical Services you may have received from other than [facility] providers in the past year (date may be approximate).     Assessment:   This is a routine wellness examination for [name].  Hearing/Vision screen Hearing Screening - Comments:: Pt denies any hearing issues  Vision Screening - Comments:: Pt follows up with Dr Gershon Crane for annual eye exams   Dietary issues and exercise activities discussed: Current Exercise Habits: The patient does not participate in regular exercise at present   Goals Addressed             This Visit's Progress    Patient Stated       Get weight down       Depression Screen PHQ 2/9 Scores 08/10/2021 07/20/2020 05/16/2019 02/01/2018 04/27/2017 03/14/2016 02/17/2016  PHQ - 2 Score 0 1 0 0 0 0 0    Fall Risk Fall Risk  08/10/2021 05/17/2021 07/20/2020 05/16/2019 02/01/2018  Falls in the past year? 1 1 1  0 Yes  Comment - - - - -  Number falls in past yr: 1 0 1 0 2 or more  Injury with Fall? 1 0 1 0 No  Comment - - bruised righ thip falling out of bed - -  Risk for fall due to : Impaired balance/gait;Impaired vision - Impaired balance/gait;Impaired mobility Orthopedic patient -  Follow up Falls prevention discussed Education provided Falls prevention discussed Education provided Education provided    [place]:  Any stairs in or around the home? Yes   If so, are there any without handrails? No  Home free of loose throw rugs in walkways, pet beds, electrical cords, etc? Yes  Adequate lighting in your home to reduce risk of falls? Yes   ASSISTIVE DEVICES UTILIZED TO PREVENT FALLS:  Life alert? No  Use of a cane, walker or w/c? No  Grab bars in the bathroom? Yes  Shower chair or bench in shower? No  Elevated toilet seat or a handicapped toilet? No   TIMED UP AND GO:  Was the test performed? No .   Cognitive Function: MMSE - Mini Mental State Exam 02/01/2018  Not completed: Refused     6CIT Screen 08/10/2021 07/20/2020  What Year? 0 points 0 points  What month? 0 points 0 points  What time? 0 points -  Count back from 20 0 points 0 points  Months in reverse 0 points 0 points  Repeat phrase 0 points 0 points  Total Score 0 -    Immunizations Immunization History  Administered Date(s) Administered   Fluad Quad(high Dose 65+) 07/20/2020   Influenza Split 07/02/2012, 07/22/2013   Influenza, High Dose Seasonal PF 07/11/2019   Influenza,inj,Quad PF,6+ Mos 06/16/2014, 07/26/2016, 07/06/2017, 07/09/2018   PFIZER(Purple Top)SARS-COV-2 Vaccination 10/31/2019, 11/21/2019, 08/05/2020, 07/26/2021   Pneumococcal Conjugate-13 05/16/2019   Pneumococcal Polysaccharide-23 04/27/2017   Tdap 04/08/2008, 01/07/2018    TDAP status: Up to date  Flu Vaccine status: Due, Education has been provided regarding the importance of this vaccine. Advised may receive this vaccine at local pharmacy or Health Dept. Aware to provide a copy  of the vaccination record if obtained from local pharmacy or Health Dept. Verbalized acceptance and understanding.  Pneumococcal vaccine status: Due, Education has been provided regarding the importance of this vaccine. Advised may receive this vaccine at local pharmacy or Health Dept. Aware to provide a copy of the vaccination record if obtained from local pharmacy or Health Dept. Verbalized acceptance and  understanding.  Covid-19 vaccine status: Completed vaccines  Qualifies for Shingles Vaccine? Yes   Zostavax completed No   Shingrix Completed?: No.    Education has been provided regarding the importance of this vaccine. Patient has been advised to call insurance company to determine out of pocket expense if they have not yet received this vaccine. Advised may also receive vaccine at local pharmacy or Health Dept. Verbalized acceptance and understanding.  Screening Tests Health Maintenance  Topic Date Due   INFLUENZA VACCINE  05/09/2021   HEMOGLOBIN A1C  06/12/2021   URINE MICROALBUMIN  08/11/2021   Zoster Vaccines- Shingrix (1 of 2) 08/17/2021 (Originally 07/26/2003)   COVID-19 Vaccine (5 - Booster for Pfizer series) 09/20/2021   FOOT EXAM  12/10/2021   Pneumonia Vaccine 14+ Years old (3 - PPSV23 if available, else PCV20) 04/27/2022   OPHTHALMOLOGY EXAM  06/27/2022   MAMMOGRAM  08/17/2022   COLONOSCOPY (Pts 45-66yrs Insurance coverage will need to be confirmed)  10/17/2023   TETANUS/TDAP  01/08/2028   DEXA SCAN  Completed   Hepatitis C Screening  Completed   HPV VACCINES  Aged Out    Health Maintenance  Health Maintenance Due  Topic Date Due   INFLUENZA VACCINE  05/09/2021   HEMOGLOBIN A1C  06/12/2021   URINE MICROALBUMIN  08/11/2021    Colorectal cancer screening: Type of screening: Colonoscopy. Completed 10/16/18. Repeat every 5 years  Mammogram status: Completed 119/21. Repeat every year  Bone Density status: Completed 08/15/19. Results reflect: Bone density results: NORMAL. Repeat every 2 years.  Additional Screening:  Hepatitis C Screening:  Completed 02/17/16  Vision Screening: Recommended annual ophthalmology exams for early detection of glaucoma and other disorders of the eye. Is the patient up to date with their annual eye exam?  Yes  Who is the provider or what is the name of the office in which the patient attends annual eye exams? Dr Gershon Crane If pt is not  established with a provider, would they like to be referred to a provider to establish care? No .   Dental Screening: Recommended annual dental exams for proper oral hygiene  Community Resource Referral / Chronic Care Management: CRR required this visit?  No   CCM required this visit?  No      Plan:     I have personally reviewed and noted the following in the patient's chart:   Medical and social history Use of alcohol, tobacco or illicit drugs  Current medications and supplements including opioid prescriptions.  Functional ability and status Nutritional status Physical activity Advanced directives List of other physicians Hospitalizations, surgeries, and ER visits in previous 12 months Vitals Screenings to include cognitive, depression, and falls Referrals and appointments  In addition, I have reviewed and discussed with patient certain preventive protocols, quality metrics, and best practice recommendations. A written personalized care plan for preventive services as well as general preventive health recommendations were provided to patient.     Willette Brace, LPN   [id number]/[id number]   Nurse Notes: None

## 2021-08-10 NOTE — Patient Instructions (Addendum)
[name] , Thank you for taking time to come for your Medicare Wellness Visit. I appreciate your ongoing commitment to your health goals. Please review the following plan we discussed and let me know if I can assist you in the future.   Screening recommendations/referrals: Colonoscopy: done 10/16/18 repeat every 5 years  Mammogram: Done 08/17/20 repeat every year Bone Density: Done 08/15/19 repeat every 2 years  Recommended yearly ophthalmology/optometry visit for glaucoma screening and checkup Recommended yearly dental visit for hygiene and checkup  Vaccinations: Influenza vaccine: Due  Pneumococcal vaccine: Due Tdap vaccine: Done 01/07/18 Repeat every 10 years  Shingles vaccine: Shingrix discussed. Please contact your pharmacy for coverage information.    Covid-19:Completed 1/22, 2/12, 08/05/20 & 07/26/21  Advanced directives: Advance directive discussed with you today. Even though you declined this today please call our office should you change your mind and we can give you the proper paperwork for you to fill out.  Conditions/risks identified: get weight down  Next appointment: Follow up in one year for your annual wellness visit    Preventive Care 65 Years and Older, Female Preventive care refers to lifestyle choices and visits with your health care provider that can promote health and wellness. What does preventive care include? A yearly physical exam. This is also called an annual well check. Dental exams once or twice a year. Routine eye exams. Ask your health care provider how often you should have your eyes checked. Personal lifestyle choices, including: Daily care of your teeth and gums. Regular physical activity. Eating a healthy diet. Avoiding tobacco and drug use. Limiting alcohol use. Practicing safe sex. Taking low-dose aspirin every day. Taking vitamin and mineral supplements as recommended by your health care provider. What happens during an annual well  check? The services and screenings done by your health care provider during your annual well check will depend on your age, overall health, lifestyle risk factors, and family history of disease. Counseling  Your health care provider may ask you questions about your: Alcohol use. Tobacco use. Drug use. Emotional well-being. Home and relationship well-being. Sexual activity. Eating habits. History of falls. Memory and ability to understand (cognition). Work and work Statistician. Reproductive health. Screening  You may have the following tests or measurements: Height, weight, and BMI. Blood pressure. Lipid and cholesterol levels. These may be checked every 5 years, or more frequently if you are over 41 years old. Skin check. Lung cancer screening. You may have this screening every year starting at age 70 if you have a 30-pack-year history of smoking and currently smoke or have quit within the past 15 years. Fecal occult blood test (FOBT) of the stool. You may have this test every year starting at age 35. Flexible sigmoidoscopy or colonoscopy. You may have a sigmoidoscopy every 5 years or a colonoscopy every 10 years starting at age 27. Hepatitis C blood test. Hepatitis B blood test. Sexually transmitted disease (STD) testing. Diabetes screening. This is done by checking your blood sugar (glucose) after you have not eaten for a while (fasting). You may have this done every 1-3 years. Bone density scan. This is done to screen for osteoporosis. You may have this done starting at age 93. Mammogram. This may be done every 1-2 years. Talk to your health care provider about how often you should have regular mammograms. Talk with your health care provider about your test results, treatment options, and if necessary, the need for more tests. Vaccines  Your health care provider may recommend  certain vaccines, such as: Influenza vaccine. This is recommended every year. Tetanus, diphtheria, and  acellular pertussis (Tdap, Td) vaccine. You may need a Td booster every 10 years. Zoster vaccine. You may need this after age 62. Pneumococcal 13-valent conjugate (PCV13) vaccine. One dose is recommended after age 46. Pneumococcal polysaccharide (PPSV23) vaccine. One dose is recommended after age 29. Talk to your health care provider about which screenings and vaccines you need and how often you need them. This information is not intended to replace advice given to you by your health care provider. Make sure you discuss any questions you have with your health care provider. Document Released: 10/22/2015 Document Revised: 06/14/2016 Document Reviewed: 07/27/2015 Elsevier Interactive Patient Education  2017 [place] Prevention in the Home Falls can cause injuries. They can happen to people of all ages. There are many things you can do to make your home safe and to help prevent falls. What can I do on the outside of my home? Regularly fix the edges of walkways and driveways and fix any cracks. Remove anything that might make you trip as you walk through a door, such as a raised step or threshold. Trim any bushes or trees on the path to your home. Use bright outdoor lighting. Clear any walking paths of anything that might make someone trip, such as rocks or tools. Regularly check to see if handrails are loose or broken. Make sure that both sides of any steps have handrails. Any raised decks and porches should have guardrails on the edges. Have any leaves, snow, or ice cleared regularly. Use sand or salt on walking paths during winter. Clean up any spills in your garage right away. This includes oil or grease spills. What can I do in the bathroom? Use night lights. Install grab bars by the toilet and in the tub and shower. Do not use towel bars as grab bars. Use non-skid mats or decals in the tub or shower. If you need to sit down in the shower, use a plastic, non-slip stool. Keep  the floor dry. Clean up any water that spills on the floor as soon as it happens. Remove soap buildup in the tub or shower regularly. Attach bath mats securely with double-sided non-slip rug tape. Do not have throw rugs and other things on the floor that can make you trip. What can I do in the bedroom? Use night lights. Make sure that you have a light by your bed that is easy to reach. Do not use any sheets or blankets that are too big for your bed. They should not hang down onto the floor. Have a firm chair that has side arms. You can use this for support while you get dressed. Do not have throw rugs and other things on the floor that can make you trip. What can I do in the kitchen? Clean up any spills right away. Avoid walking on wet floors. Keep items that you use a lot in easy-to-reach places. If you need to reach something above you, use a strong step stool that has a grab bar. Keep electrical cords out of the way. Do not use floor polish or wax that makes floors slippery. If you must use wax, use non-skid floor wax. Do not have throw rugs and other things on the floor that can make you trip. What can I do with my stairs? Do not leave any items on the stairs. Make sure that there are handrails on both sides of the  stairs and use them. Fix handrails that are broken or loose. Make sure that handrails are as long as the stairways. Check any carpeting to make sure that it is firmly attached to the stairs. Fix any carpet that is loose or worn. Avoid having throw rugs at the top or bottom of the stairs. If you do have throw rugs, attach them to the floor with carpet tape. Make sure that you have a light switch at the top of the stairs and the bottom of the stairs. If you do not have them, ask someone to add them for you. What else can I do to help prevent falls? Wear shoes that: Do not have high heels. Have rubber bottoms. Are comfortable and fit you well. Are closed at the toe. Do not  wear sandals. If you use a stepladder: Make sure that it is fully opened. Do not climb a closed stepladder. Make sure that both sides of the stepladder are locked into place. Ask someone to hold it for you, if possible. Clearly mark and make sure that you can see: Any grab bars or handrails. First and last steps. Where the edge of each step is. Use tools that help you move around (mobility aids) if they are needed. These include: Canes. Walkers. Scooters. Crutches. Turn on the lights when you go into a dark area. Replace any light bulbs as soon as they burn out. Set up your furniture so you have a clear path. Avoid moving your furniture around. If any of your floors are uneven, fix them. If there are any pets around you, be aware of where they are. Review your medicines with your doctor. Some medicines can make you feel dizzy. This can increase your chance of falling. Ask your doctor what other things that you can do to help prevent falls. This information is not intended to replace advice given to you by your health care provider. Make sure you discuss any questions you have with your health care provider. Document Released: 07/22/2009 Document Revised: 03/02/2016 Document Reviewed: 10/30/2014 Elsevier Interactive Patient Education  2017 [employer].

## 2021-08-17 ENCOUNTER — Ambulatory Visit: Payer: Medicare PPO | Admitting: Family Medicine

## 2021-08-17 ENCOUNTER — Other Ambulatory Visit: Payer: Self-pay

## 2021-08-17 ENCOUNTER — Encounter: Payer: Self-pay | Admitting: Family Medicine

## 2021-08-17 VITALS — BP 124/72 | HR 50 | Temp 98.1°F | Resp 16 | Ht 61.0 in | Wt 245.4 lb

## 2021-08-17 DIAGNOSIS — R200 Anesthesia of skin: Secondary | ICD-10-CM | POA: Diagnosis not present

## 2021-08-17 DIAGNOSIS — E785 Hyperlipidemia, unspecified: Secondary | ICD-10-CM | POA: Diagnosis not present

## 2021-08-17 DIAGNOSIS — R001 Bradycardia, unspecified: Secondary | ICD-10-CM | POA: Diagnosis not present

## 2021-08-17 DIAGNOSIS — N898 Other specified noninflammatory disorders of vagina: Secondary | ICD-10-CM

## 2021-08-17 DIAGNOSIS — I10 Essential (primary) hypertension: Secondary | ICD-10-CM

## 2021-08-17 DIAGNOSIS — Z23 Encounter for immunization: Secondary | ICD-10-CM

## 2021-08-17 DIAGNOSIS — R202 Paresthesia of skin: Secondary | ICD-10-CM | POA: Diagnosis not present

## 2021-08-17 DIAGNOSIS — E1169 Type 2 diabetes mellitus with other specified complication: Secondary | ICD-10-CM | POA: Diagnosis not present

## 2021-08-17 LAB — LIPID PANEL
Cholesterol: 158 mg/dL (ref 0–200)
HDL: 54.1 mg/dL (ref >39.00)
LDL Cholesterol: 86 mg/dL (ref 0–99)
NonHDL: 104.22
Total CHOL/HDL Ratio: 3
Triglycerides: 89 mg/dL (ref 0.0–149.0)
VLDL: 17.8 mg/dL (ref 0.0–40.0)

## 2021-08-17 LAB — COMPREHENSIVE METABOLIC PANEL
ALT: 21 U/L (ref 0–35)
AST: 31 U/L (ref 0–37)
Albumin: 4.2 g/dL (ref 3.5–5.2)
Alkaline Phosphatase: 63 U/L (ref 39–117)
BUN: 21 mg/dL (ref 6–23)
CO2: 32 mEq/L (ref 19–32)
Calcium: 10.3 mg/dL (ref 8.4–10.5)
Chloride: 102 mEq/L (ref 96–112)
Creatinine, Ser: 0.99 mg/dL (ref 0.40–1.20)
GFR: 58.77 mL/min — ABNORMAL LOW (ref >60.00)
Glucose, Bld: 96 mg/dL (ref 70–99)
Potassium: 3.8 mEq/L (ref 3.5–5.1)
Sodium: 142 mEq/L (ref 135–145)
Total Bilirubin: 0.3 mg/dL (ref 0.2–1.2)
Total Protein: 7.1 g/dL (ref 6.0–8.3)

## 2021-08-17 LAB — TSH: TSH: 2.68 u[IU]/mL (ref 0.35–5.50)

## 2021-08-17 LAB — VITAMIN B12: Vitamin B-12: 316 pg/mL (ref 211–911)

## 2021-08-17 LAB — MICROALBUMIN / CREATININE URINE RATIO
Creatinine,U: 107 mg/dL
Microalb Creat Ratio: 0.7 mg/g (ref 0.0–30.0)
Microalb, Ur: <0.7 mg/dL (ref 0.0–1.9)

## 2021-08-17 LAB — HEMOGLOBIN A1C: Hgb A1c MFr Bld: 6.8 % — ABNORMAL HIGH (ref 4.6–6.5)

## 2021-08-17 NOTE — Patient Instructions (Addendum)
A few things to remember from today's visit:  Type 2 diabetes mellitus with other specified complication, without long-term current use of insulin ([place]) - Plan: Hemoglobin A1c, Fructosamine, Microalbumin / creatinine urine ratio  Vaginal pruritus - Plan: Ambulatory referral to Gynecology  Bradycardia  Essential hypertension - Plan: Comprehensive metabolic panel, TSH  Numbness and tingling of both upper extremities - Plan: Comprehensive metabolic panel, Vitamin O64  Hyperlipidemia associated with type 2 diabetes mellitus ([place]) - Plan: Comprehensive metabolic panel, Lipid panel  If you need refills please call your pharmacy. Do not use My Chart to request refills or for acute issues that need immediate attention.   Hear rate is low today is low, so try decreasing Atenolol dose from 1/2 tab daily to alternating between 1/2 tab and 1/4 tab every other day. Continue monitoring numbness, we may need neuro evaluation given the fact you can not have MRI or take Prednisone.  Please be sure medication list is accurate. If a new problem present, please set up appointment sooner than planned today.

## 2021-08-17 NOTE — Progress Notes (Signed)
HPI: Chief Complaint  Patient presents with   Follow-up   [name] is a 70 y.o. female, who is here today for chronic disease management.  Last seen on 05/17/21 for her CPE.  She is frustrated because she is getting phone calls from her health insurance inquiring about her last CPE. According to pt, she was told that because she did not have blood work it was not consider a CPE. She is under a lot of stress already due to her mother health problems.  Hypertension:  Medications:HCTZ 25 mg daily, Atenolol 25 mg 1/2 tab daily,and Amlodipine 5 mg daily. BP readings at home: 120's/60's. HR 40's-132. She has taken 1/4 extra of Atenolol x 2 since her last visit because palpitations, which she ascribes to Hydrocodone-Acetaminophen. Side effects:None. Negative for unusual or severe headache, visual changes, exertional chest pain, dyspnea,  focal weakness, or edema. Since her last visit she has seen cardiologist for atrial fib. She is on Eliquis 5 mg bid and Flecainide 100 mg bid.  HypoK+ on KLOR 10 meq daily.  Lab Results  Component Value Date   CREATININE 0.88 12/10/2020   BUN 17 12/10/2020   NA 144 12/10/2020   K 3.8 12/10/2020   CL 103 12/10/2020   CO2 31 12/10/2020   Diabetes Mellitus II: Dx'ed in 2018. - Checking BG at home: Not checking. She is on non pharmacologic treatment. Frequent steroid injection for lumbar radiculopathy.  - Diet: Not consistently. - Exercise: None , aggravates chronic pain. - eye exam: 06/2021. - foot exam: 12/2020.  - Negative for symptoms of hypoglycemia, polyuria, polydipsia,foot ulcers/trauma  Lab Results  Component Value Date   HGBA1C 6.6 (H) 12/10/2020   Lab Results  Component Value Date   MICROALBUR 1.6 08/11/2020   Hyperlipidemia: Currently on Pravastatin 20 mg daily. Following a low fat diet: Yes. Side effects from medication:None.  Lab Results  Component Value Date   CHOL 169 12/10/2020   HDL 47.40 12/10/2020    LDLCALC 95 12/10/2020   TRIG 131.0 12/10/2020   CHOLHDL 4 12/10/2020   Vaginal pruritus on and off for a while. Diflucan 150 mg helps, annoyed by the fact she cannot get Rx upon request when she needs it. Vaginal bleeding when she applies cream with applicator. She would like to see gyn.  RUE and left forearm intermittent numbness for the past 2 weeks. Occasional neck pain. Last a few seconds, no associated weakness. Has waken her up and happens sometimes while driving. She has not noted rash or skin changes. She takes a daily multivitamin.  Review of Systems  Constitutional:  Positive for fatigue. Negative for activity change, appetite change, chills and fever.  HENT:  Negative for mouth sores, nosebleeds and sore throat.   Respiratory:  Negative for cough and wheezing.   Genitourinary:  Negative for decreased urine volume, dysuria and hematuria.  Musculoskeletal:  Positive for arthralgias and back pain.  Neurological:  Negative for syncope and facial asymmetry.  Psychiatric/Behavioral:  Negative for confusion. The patient is nervous/anxious.   Rest of ROS see pertinent positives and negatives in HPI.  Current Outpatient Medications on File Prior to Visit  Medication Sig Dispense Refill   Aspirin-Salicylamide-Caffeine (BC FAST PAIN RELIEF) 650-195-33.3 MG PACK Take 1 tablet by mouth as needed.     atenolol (TENORMIN) 25 MG tablet Take 0.5 tablets (12.5 mg total) by mouth daily. May take an extra 1/2 tablet for breakthrough afib 120 tablet 3   azelastine (ASTELIN) 0.1 % nasal spray  Place 2 sprays into both nostrils 2 (two) times daily. Use in each nostril as directed 30 mL 11   calcium carbonate (TUMS EX) 750 MG chewable tablet Chew 2 tablets by mouth daily as needed for heartburn.     cyclobenzaprine (FLEXERIL) 10 MG tablet Take 2.5 mg by mouth daily as needed (pain level 8 or greater).      diclofenac sodium (VOLTAREN) 1 % GEL APPLY (4G) BY TOPICAL ROUTE 4 TIMES EVERY DAY TO THE  AFFECTED AREA(S) (WC PA)  1   fluticasone (FLONASE) 50 MCG/ACT nasal spray Use 2 spray(s) in each nostril once daily 48 g 0   guaiFENesin (MUCINEX) 600 MG 12 hr tablet Take 300 mg by mouth as needed.      hydrochlorothiazide (HYDRODIURIL) 25 MG tablet TAKE 1 TABLET BY MOUTH ONCE DAILY (DUE  FOR  FOLLOW  UP) 90 tablet 0   HYDROcodone-acetaminophen (NORCO/VICODIN) 5-325 MG tablet Take 0.5 tablets by mouth daily as needed (pain level 8 or greater). 30 tablet 0   hydrocortisone 2.5 % ointment APPLY TWICE A DAY 60 g 11   hydrOXYzine (ATARAX/VISTARIL) 25 MG tablet Take 1 tablet (25 mg total) by mouth 3 (three) times daily as needed for anxiety. 90 tablet 3   Multiple Vitamins-Minerals (MULTI-VITAMIN GUMMIES PO) Take by mouth as needed.     omeprazole (PRILOSEC) 40 MG capsule Take 1 capsule (40 mg total) by mouth daily. 60 capsule 11   Polyethylene Glycol 3350 (MIRALAX PO) Take by mouth as needed.     potassium chloride (KLOR-CON) 10 MEQ tablet Take 2 tablets by mouth once daily 180 tablet 1   pravastatin (PRAVACHOL) 20 MG tablet Take by mouth twice a week 27 tablet 3   trolamine salicylate (ASPERCREME) 10 % cream Apply 1 application topically as needed for muscle pain.     cetirizine (ZYRTEC) 10 MG tablet Take 10 mg by mouth daily. Alternating with Allegra (Patient not taking: Reported on 08/17/2021)     flecainide (TAMBOCOR) 100 MG tablet Take 1 tablet (100 mg total) by mouth 2 (two) times daily. 60 tablet 2   No current facility-administered medications on file prior to visit.    Past Medical History:  Diagnosis Date   Abdominal pain, epigastric 12/28/2008   Centricity Description: ABDOMINAL PAIN, EPIGASTRIC Qualifier: Diagnosis of  By: [name], [name]   Centricity Description: ABDOMINAL PAIN-EPIGASTRIC Qualifier: Diagnosis of  By: [name], [name]    Allergic rhinitis 09/16/2013   Allergy    Anemia    Anxiety    Anxiety disorder, unspecified 07/06/2017   Arthritis    Blood in  stool    BMI 45.0-49.9, adult ([place]) 07/26/2016   Class 3 obesity with serious comorbidity and body mass index (BMI) of 45.0 to 49.9 in adult 03/07/2017   DDD (degenerative disc disease), lumbar    DIVERTICULOSIS-COLON 12/28/2008   Qualifier: Diagnosis of  By: [name], [name]    GERD 12/28/2008   Qualifier: Diagnosis of  By: [name], [name]    GERD (gastroesophageal reflux disease)    Headache    Hyperglycemia 03/17/2014   Hypertension    Hypokalemia 07/26/2016   IBS (irritable bowel syndrome)-C-D 07/26/2016   Obesity    Other and unspecified hyperlipidemia 03/17/2014   PERSONAL HX COLONIC POLYPS 12/28/2008   Qualifier: Diagnosis of  By: [name], [name]    Reflux esophagitis 12/28/2008   Qualifier: Diagnosis of  By: Julaine Hua CMA (AAMA), Yvonne Kendall  lumbar spinal fusion 07/02/2012   Tubulovillous adenoma of colon 2008    Allergies  Allergen Reactions   Crestor [Rosuvastatin]    Erythromycin     rash   Lansoprazole    Penicillins     rash   Potassium-Containing Compounds Other (See Comments)    Stomach upset, can take pills, but not the packets    Robaxin [Methocarbamol]     Messed up stomach   Sulfonamide Derivatives     rash   Trental [Pentoxifylline Er]     Bad headache    Social History   Socioeconomic History   Marital status: Widowed    Spouse name: Not on file   Number of children: Not on file   Years of education: Not on file   Highest education level: Not on file  Occupational History   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   Social Determinants of Health   Financial Resource Strain: Low Risk    Difficulty of Paying Living Expenses: Not hard at all  Food Insecurity: No Food Insecurity   Worried About Charity fundraiser in the Last Year: Never true   [place] in the Last Year: Never true  Transportation Needs: No Transportation Needs   Lack of Transportation (Medical): No   Lack of Transportation (Non-Medical): No  Physical Activity: Inactive   Days of Exercise per Week: 0 days   Minutes of Exercise per Session: 0 min  Stress: No Stress Concern Present   Feeling of Stress : Only a little  Social Connections: Moderately Isolated   Frequency of Communication with Friends and Family: Twice a week   Frequency of Social Gatherings with Friends and Family: Once a week   Attends Religious Services: 1 to 4 times per year   Active Member of [employer] or Organizations: No   Attends Archivist Meetings: Never   Marital Status: Widowed   Today's Vitals   08/17/21 0906  BP: 124/72  Pulse: (!) 50  Resp: 16  Temp: 98.1 F (36.7 C)  SpO2: 97%  Weight: 245 lb 6.4 oz (111.3 kg)  Height: 5\' 1"  (1.549 m)   Body mass index is 46.37 kg/m.  Physical Exam Vitals and nursing note reviewed.  Constitutional:      General: She is not in acute distress.    Appearance: She is well-developed.  HENT:     Head: Normocephalic and atraumatic.     Mouth/Throat:     Mouth: Mucous membranes are moist.     Pharynx: Oropharynx is clear.  Eyes:     Conjunctiva/sclera: Conjunctivae normal.  Cardiovascular:     Rate and Rhythm: Regular rhythm. Bradycardia present.     Pulses:          Dorsalis pedis pulses are 2+ on the right side and 2+ on the left side.  Heart sounds: No murmur heard. Pulmonary:     Effort: Pulmonary effort is normal. No respiratory distress.     Breath sounds: Normal breath sounds.  Abdominal:     Palpations:  Abdomen is soft. There is no hepatomegaly or mass.     Tenderness: There is no abdominal tenderness.  Musculoskeletal:     Cervical back: Tenderness present. Pain with movement present. Decreased range of motion.       Back:     Comments: Tinel and Phalen negative bilateral.  Lymphadenopathy:     Cervical: No cervical adenopathy.  Skin:    General: Skin is warm.     Findings: No erythema or rash.  Neurological:     General: No focal deficit present.     Mental Status: She is alert and oriented to person, place, and time.     Cranial Nerves: No cranial nerve deficit.     Comments: Antalgic gait, not assisted.  Psychiatric:        Mood and Affect: Mood is anxious.     Comments: Well groomed, good eye contact.   ASSESSMENT AND PLAN:  [name] was seen today for follow-up.  Diagnoses and all orders for this visit: Orders Placed This Encounter  Procedures   Flu Vaccine QUAD High Dose(Fluad)   Comprehensive metabolic panel   Lipid panel   Hemoglobin A1c   Fructosamine   Microalbumin / creatinine urine ratio   TSH   Vitamin B12   Ambulatory referral to Gynecology   Lab Results  Component Value Date   [id number] 098 08/17/2021   Lab Results  Component Value Date   HGBA1C 6.8 (H) 08/17/2021   Lab Results  Component Value Date   TSH 2.68 08/17/2021   Lab Results  Component Value Date   CREATININE 0.99 08/17/2021   BUN 21 08/17/2021   NA 142 08/17/2021   K 3.8 08/17/2021   CL 102 08/17/2021   CO2 32 08/17/2021   Lab Results  Component Value Date   ALT 21 08/17/2021   AST 31 08/17/2021   ALKPHOS 63 08/17/2021   BILITOT 0.3 08/17/2021   Lab Results  Component Value Date   CHOL 158 08/17/2021   HDL 54.10 08/17/2021   LDLCALC 86 08/17/2021   TRIG 89.0 08/17/2021   CHOLHDL 3 08/17/2021   Type 2 diabetes mellitus with other specified complication, without long-term current use of insulin ([place]) HgA1C has been at goal. Continue non pharmacologic  treatment.  Regular exercise and healthy diet with avoidance of added sugar food intake is an important part of treatment and recommended. Annual eye exam, periodic dental and foot care recommended. F/U in 5-6 months  Vaginal pruritus Recurrent and associated with vaginal bleeding. Explained that it is not appropriate to continue taking Diflucan for pruritus without ruling out other problems. Some side effects of medication discussed. Gyn referral placed.  Bradycardia We discussed some side effects of Atenolol. We could change to a prn medication. She is afraid of making changes, does not want to take it prn, she has a pill cutter, agrees with alternating between 1/2 tab and 1/4 tab daily. She would like to message cardio about changes today. Continue monitoring HR. Instructed about warning signs..  Essential hypertension BP adequately controlled. Continue Amlodipine 5 mg daily and HCTZ 25 mg daily.Atenolol dose decreased because bradycardia. DASH/low salt diet to continue. Continue monitoring BP. Eye exam is current.  Numbness and tingling of both upper extremities We discussed possible etiologies: Radiculopathy, carpal tunnel  synd among some. Hx and examination do not suggest a serious process. States that she cannot have cervical MRI's because claustrophobia, sedation is not effective. After discussion of some side effects, she is not interested in trial of Prednisone. EMG to be considered if not resolved or if problem gets worse.  Hyperlipidemia associated with type 2 diabetes mellitus ([facility]) Continue Pravastatin 20 mg daily and low fat diet. Further recommendations according to FLP results.  Need for influenza vaccination -     Flu Vaccine QUAD High Dose(Fluad)  I spent a total of 43 minutes in both face to face and non face to face activities for this visit on the date of this encounter. During this time history was obtained and documented, examination was performed, prior  labs reviewed, and assessment/plan discussed. I explained that I check her labs regularly and last visit I did not think she needed labs because numbers have been stable. Some labs I recommend checking annually and some q 6 months.  Return in about 5 months (around 01/15/2022).  Zaley Talley [name], MD [facility]. [place] office.

## 2021-08-18 ENCOUNTER — Other Ambulatory Visit: Payer: Self-pay | Admitting: Family Medicine

## 2021-08-18 DIAGNOSIS — I10 Essential (primary) hypertension: Secondary | ICD-10-CM

## 2021-08-21 LAB — FRUCTOSAMINE: Fructosamine: 222 umol/L (ref 205–285)

## 2021-08-23 DIAGNOSIS — Z1231 Encounter for screening mammogram for malignant neoplasm of breast: Secondary | ICD-10-CM | POA: Diagnosis not present

## 2021-08-23 LAB — HM MAMMOGRAPHY

## 2021-08-26 ENCOUNTER — Encounter: Payer: Self-pay | Admitting: Family Medicine

## 2021-08-29 ENCOUNTER — Telehealth: Payer: Self-pay | Admitting: Family Medicine

## 2021-08-29 NOTE — Telephone Encounter (Signed)
Called [name] to let her know that I did communicate with [name] to let him know about episodes of bradycardia and changes in Atenolol.  She tried to decreased Atenolol to 1/4 tab but did not feel well, had episodes of atrial fib more frequent. She is not interested for now in trying new medications. She has no symptoms when she has HR's 40's-50's, which is not very frequent. Instructed to continue Atenolol 25 mg 1/2 tab daily and continue monitoring HR. Clearly instructed about warning signs. Keep next appt with cardiologist. She voices understanding and agrees with plan. Megan Allocca [name], MD

## 2021-09-12 ENCOUNTER — Other Ambulatory Visit: Payer: Self-pay | Admitting: Family Medicine

## 2021-09-12 ENCOUNTER — Telehealth: Payer: Self-pay | Admitting: Family Medicine

## 2021-09-12 ENCOUNTER — Other Ambulatory Visit: Payer: Self-pay | Admitting: Gastroenterology

## 2021-09-12 DIAGNOSIS — K219 Gastro-esophageal reflux disease without esophagitis: Secondary | ICD-10-CM

## 2021-09-12 DIAGNOSIS — I10 Essential (primary) hypertension: Secondary | ICD-10-CM

## 2021-09-12 NOTE — Telephone Encounter (Signed)
Pt is calling and need dr [name] to take over prescribing hydrocortisone 2.5 % ointment . Dr Silvio Pate originally prescribed medication. Pt uses medication for external irritation in anal area from drainage  [facility] ([facility] [place] Phone:  [phone]  Fax:  [phone]

## 2021-09-16 NOTE — Telephone Encounter (Signed)
I spoke with patient. We went over the information below and she verbalized understanding. The problem is stable. Okay to refill x 1? Until her next appt?

## 2021-09-16 NOTE — Telephone Encounter (Signed)
I could continue prescribing topical Hydrocortisone as far as problem is stable and she does not need it often. We can discuss problem next visit in more detail. Thanks, BJ

## 2021-09-19 ENCOUNTER — Other Ambulatory Visit: Payer: Self-pay | Admitting: Family Medicine

## 2021-09-19 DIAGNOSIS — K219 Gastro-esophageal reflux disease without esophagitis: Secondary | ICD-10-CM

## 2021-09-19 MED ORDER — HYDROCORTISONE 2.5 % EX OINT: TOPICAL_OINTMENT | CUTANEOUS | 3 refills | Status: DC

## 2021-09-19 NOTE — Telephone Encounter (Signed)
Rx sent. Naveyah Iacovelli [name], MD

## 2021-09-27 ENCOUNTER — Other Ambulatory Visit: Payer: Self-pay

## 2021-09-27 ENCOUNTER — Encounter: Payer: Self-pay | Admitting: Obstetrics and Gynecology

## 2021-09-27 ENCOUNTER — Ambulatory Visit: Payer: Medicare PPO | Admitting: Obstetrics and Gynecology

## 2021-09-27 DIAGNOSIS — N898 Other specified noninflammatory disorders of vagina: Secondary | ICD-10-CM | POA: Insufficient documentation

## 2021-09-27 NOTE — Progress Notes (Signed)
Pt not seen, left before evaluation

## 2021-10-12 DIAGNOSIS — F439 Reaction to severe stress, unspecified: Secondary | ICD-10-CM | POA: Diagnosis not present

## 2021-10-12 DIAGNOSIS — K08409 Partial loss of teeth, unspecified cause, unspecified class: Secondary | ICD-10-CM | POA: Diagnosis not present

## 2021-10-12 DIAGNOSIS — I110 Hypertensive heart disease with heart failure: Secondary | ICD-10-CM | POA: Diagnosis not present

## 2021-10-12 DIAGNOSIS — F419 Anxiety disorder, unspecified: Secondary | ICD-10-CM | POA: Diagnosis not present

## 2021-10-12 DIAGNOSIS — Z6841 Body Mass Index (BMI) 40.0 and over, adult: Secondary | ICD-10-CM | POA: Diagnosis not present

## 2021-10-12 DIAGNOSIS — E1136 Type 2 diabetes mellitus with diabetic cataract: Secondary | ICD-10-CM | POA: Diagnosis not present

## 2021-10-12 DIAGNOSIS — Z7901 Long term (current) use of anticoagulants: Secondary | ICD-10-CM | POA: Diagnosis not present

## 2021-10-12 DIAGNOSIS — Z8249 Family history of ischemic heart disease and other diseases of the circulatory system: Secondary | ICD-10-CM | POA: Diagnosis not present

## 2021-10-12 DIAGNOSIS — G8929 Other chronic pain: Secondary | ICD-10-CM | POA: Diagnosis not present

## 2021-10-12 DIAGNOSIS — J309 Allergic rhinitis, unspecified: Secondary | ICD-10-CM | POA: Diagnosis not present

## 2021-10-12 DIAGNOSIS — K581 Irritable bowel syndrome with constipation: Secondary | ICD-10-CM | POA: Diagnosis not present

## 2021-10-12 DIAGNOSIS — M1990 Unspecified osteoarthritis, unspecified site: Secondary | ICD-10-CM | POA: Diagnosis not present

## 2021-10-12 DIAGNOSIS — K219 Gastro-esophageal reflux disease without esophagitis: Secondary | ICD-10-CM | POA: Diagnosis not present

## 2021-10-12 DIAGNOSIS — E785 Hyperlipidemia, unspecified: Secondary | ICD-10-CM | POA: Diagnosis not present

## 2021-10-12 DIAGNOSIS — E261 Secondary hyperaldosteronism: Secondary | ICD-10-CM | POA: Diagnosis not present

## 2021-10-12 DIAGNOSIS — D6869 Other thrombophilia: Secondary | ICD-10-CM | POA: Diagnosis not present

## 2021-10-12 DIAGNOSIS — R32 Unspecified urinary incontinence: Secondary | ICD-10-CM | POA: Diagnosis not present

## 2021-10-20 ENCOUNTER — Ambulatory Visit (INDEPENDENT_AMBULATORY_CARE_PROVIDER_SITE_OTHER): Payer: Medicare PPO

## 2021-10-20 ENCOUNTER — Other Ambulatory Visit (HOSPITAL_COMMUNITY): Payer: Self-pay | Admitting: Physician Assistant

## 2021-10-20 ENCOUNTER — Encounter: Payer: Self-pay | Admitting: Adult Health

## 2021-10-20 ENCOUNTER — Other Ambulatory Visit: Payer: Self-pay

## 2021-10-20 ENCOUNTER — Ambulatory Visit: Payer: Medicare PPO | Admitting: Adult Health

## 2021-10-20 VITALS — BP 140/80 | HR 62 | Temp 98.1°F | Ht 61.0 in | Wt 239.0 lb

## 2021-10-20 DIAGNOSIS — T148XXA Other injury of unspecified body region, initial encounter: Secondary | ICD-10-CM

## 2021-10-20 DIAGNOSIS — S0990XA Unspecified injury of head, initial encounter: Secondary | ICD-10-CM

## 2021-10-20 DIAGNOSIS — R2681 Unsteadiness on feet: Secondary | ICD-10-CM

## 2021-10-20 DIAGNOSIS — M25562 Pain in left knee: Secondary | ICD-10-CM

## 2021-10-20 NOTE — Progress Notes (Signed)
Subjective:    Patient ID: [name], female    DOB: [birth year], 71 y.o.   MRN: [id number]  HPI 71 year old female who  has a past medical history of Abdominal pain, epigastric (12/28/2008), Allergic rhinitis (09/16/2013), Allergy, Anemia, Anxiety, Anxiety disorder, unspecified (07/06/2017), Arthritis, Blood in stool, BMI 45.0-49.9, adult ([place]) (07/26/2016), Class 3 obesity with serious comorbidity and body mass index (BMI) of 45.0 to 49.9 in adult (03/07/2017), DDD (degenerative disc disease), lumbar, DIVERTICULOSIS-COLON (12/28/2008), GERD (12/28/2008), GERD (gastroesophageal reflux disease), Headache, Hyperglycemia (03/17/2014), Hypertension, Hypokalemia (07/26/2016), IBS (irritable bowel syndrome)-C-D (07/26/2016), Obesity, Other and unspecified hyperlipidemia (03/17/2014), PERSONAL HX COLONIC POLYPS (12/28/2008), Reflux esophagitis (12/28/2008), S/P lumbar spinal fusion (07/02/2012), and Tubulovillous adenoma of colon (2008).  She is a patient of Dr. [name], who I am seeing today for the first time due to an acute issue   She reports that over the last two weeks she has had two falls over the last week   Her first fall stemmed from a mechanical fall when a family members small dog got caught under her feet. This fall caused her to fall onto her right leg  Her second fall happened a week ago. She does not remember how she fell. She remembers being in the kitchen and turning around. The next thing she remembers is that of being on the floor. She reports hitting the front of her head and left knee. She reports headaches ( history of headache) but feels as though her headaches are more nagging but are improving at the week goes on. Denies blurred vision, nausea, vomiting, or confusion. She is on Eliquis. Feels as  though her BP may have dropped.   She has a bruise on her right thigh, left knee pain, and bilateral hamstring pain. She has been able to walk abnd bear weight but the knee pain causes  her to feel unstable.   Review of Systems See HPI   Past Medical History:  Diagnosis Date   Abdominal pain, epigastric 12/28/2008   Centricity Description: ABDOMINAL PAIN, EPIGASTRIC Qualifier: Diagnosis of  By: [name], [name]   Centricity Description: ABDOMINAL PAIN-EPIGASTRIC Qualifier: Diagnosis of  By: [name], [name]    Allergic rhinitis 09/16/2013   Allergy    Anemia    Anxiety    Anxiety disorder, unspecified 07/06/2017   Arthritis    Blood in stool    BMI 45.0-49.9, adult ([facility]) 07/26/2016   Class 3 obesity with serious comorbidity and body mass index (BMI) of 45.0 to 49.9 in adult 03/07/2017   DDD (degenerative disc disease), lumbar    DIVERTICULOSIS-COLON 12/28/2008   Qualifier: Diagnosis of  By: [name], [name]    GERD 12/28/2008   Qualifier: Diagnosis of  By: [name], [name]    GERD (gastroesophageal reflux disease)    Headache    Hyperglycemia 03/17/2014   Hypertension    Hypokalemia 07/26/2016   IBS (irritable bowel syndrome)-C-D 07/26/2016   Obesity    Other and unspecified hyperlipidemia 03/17/2014   PERSONAL HX COLONIC POLYPS 12/28/2008   Qualifier: Diagnosis of  By: [name], [name]    Reflux esophagitis 12/28/2008   Qualifier: Diagnosis of  By: Julaine Hua CMA (AAMA), [name]     S/P lumbar spinal fusion 07/02/2012   Tubulovillous adenoma of colon 2008    Social History   Socioeconomic History   Marital status: Widowed    Spouse name: Not on file   Number of children: Not on file  Years of education: Not on file   Highest education level: Not on file  Occupational History   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   Social Determinants of Health   Financial Resource Strain: Low Risk    Difficulty of Paying Living Expenses: Not hard at all  Food Insecurity: No Food Insecurity   Worried About Charity fundraiser in the Last Year: Never true   [place] in the Last Year: Never true  Transportation Needs: No Transportation Needs   Lack of Transportation (Medical): No   Lack of Transportation (Non-Medical): No  Physical Activity: Inactive   Days of Exercise per Week: 0 days   Minutes of Exercise per Session: 0 min  Stress: No Stress Concern Present   Feeling of Stress : Only a little  Social Connections: Moderately Isolated   Frequency of Communication with Friends and Family: Twice a week   Frequency of Social Gatherings with Friends and Family: Once a week   Attends Religious Services: 1 to 4 times per year   Active Member of [employer] or Organizations: No   Attends Archivist Meetings: Never   Marital Status: Widowed  Intimate Partner Violence: Not At Risk   Fear of Current or Ex-Partner: No   Emotionally Abused: No   Physically Abused: No   Sexually Abused: No    Past Surgical History:  Procedure Laterality Date   ABDOMINAL HYSTERECTOMY     COLONOSCOPY  01/17/2013   laser surgery to remove scar     not sure of the year; after hyst   RADIOLOGY WITH ANESTHESIA N/A 02/25/2015   Procedure: MRI OF LUMBAR SPINE;  Surgeon: Medication Radiologist, MD;  Location: [facility];  Service: Radiology;  Laterality: N/A;  DR. Bennie Pierini COHEN/MRI   SPINE SURGERY     TONSILLECTOMY AND ADENOIDECTOMY      Family History  Problem Relation Age of Onset   Hypertension Mother    Hyperlipidemia Mother    Hyperlipidemia Father    Hypertension Father    Arthritis Father     Hyperlipidemia Sister    Cervical cancer Sister    Diabetes Sister    Heart disease Sister    Cancer Maternal Grandfather    Hypertension Paternal Grandmother    Pancreatic cancer Maternal Aunt    Breast cancer Paternal Aunt    Breast cancer Cousin     Allergies  Allergen Reactions   Crestor [Rosuvastatin]    Erythromycin     rash   Lansoprazole    Penicillins     rash   Potassium-Containing Compounds Other (See Comments)    Stomach upset, can take pills, but not the packets    Robaxin [Methocarbamol]     Messed up stomach   Sulfonamide Derivatives     rash   Trental [  Pentoxifylline Er]     Bad headache    Current Outpatient Medications on File Prior to Visit  Medication Sig Dispense Refill   amLODipine (NORVASC) 5 MG tablet Take 1 tablet by mouth once daily 90 tablet 3   Aspirin-Salicylamide-Caffeine (BC FAST PAIN RELIEF) 650-195-33.3 MG PACK Take 1 tablet by mouth as needed.     atenolol (TENORMIN) 25 MG tablet Take 0.5 tablets (12.5 mg total) by mouth daily. May take an extra 1/2 tablet for breakthrough afib 120 tablet 3   azelastine (ASTELIN) 0.1 % nasal spray Place 2 sprays into both nostrils 2 (two) times daily. Use in each nostril as directed 30 mL 11   calcium carbonate (TUMS EX) 750 MG chewable tablet Chew 2 tablets by mouth daily as needed for heartburn.     cetirizine (ZYRTEC) 10 MG tablet Take 10 mg by mouth daily. Alternating with Allegra     cyclobenzaprine (FLEXERIL) 10 MG tablet Take 2.5 mg by mouth daily as needed (pain level 8 or greater).      diclofenac sodium (VOLTAREN) 1 % GEL APPLY (4G) BY TOPICAL ROUTE 4 TIMES EVERY DAY TO THE AFFECTED AREA(S) (WC PA)  1   ELIQUIS 5 MG TABS tablet Take 1 tablet by mouth twice daily 180 tablet 3   fluticasone (FLONASE) 50 MCG/ACT nasal spray Use 2 spray(s) in each nostril once daily 48 g 0   guaiFENesin (MUCINEX) 600 MG 12 hr tablet Take 300 mg by mouth as needed.     hydrochlorothiazide (HYDRODIURIL) 25 MG tablet  TAKE 1 TABLET BY MOUTH ONCE DAILY (DUE  FOR  FOLLOW  UP) 90 tablet 2   HYDROcodone-acetaminophen (NORCO/VICODIN) 5-325 MG tablet Take 0.5 tablets by mouth daily as needed (pain level 8 or greater). 30 tablet 0   hydrocortisone 2.5 % ointment Daily as needed. 60 g 3   hydrOXYzine (ATARAX/VISTARIL) 25 MG tablet Take 1 tablet (25 mg total) by mouth 3 (three) times daily as needed for anxiety. 90 tablet 3   Multiple Vitamins-Minerals (MULTI-VITAMIN GUMMIES PO) Take by mouth as needed.     omeprazole (PRILOSEC) 40 MG capsule Take 1 capsule (40 mg total) by mouth daily. 60 capsule 11   Polyethylene Glycol 3350 (MIRALAX PO) Take by mouth as needed.     potassium chloride (KLOR-CON) 10 MEQ tablet Take 2 tablets by mouth once daily 180 tablet 1   pravastatin (PRAVACHOL) 20 MG tablet Take by mouth twice a week 27 tablet 3   trolamine salicylate (ASPERCREME) 10 % cream Apply 1 application topically as needed for muscle pain.     No current facility-administered medications on file prior to visit.    BP 140/80    Pulse 62    Temp 98.1 F (36.7 C) (Oral)    Ht 5\' 1"  (1.549 m)    Wt 239 lb (108.4 kg)    SpO2 97%    BMI 45.16 kg/m       Objective:   Physical Exam Vitals and nursing note reviewed.  Constitutional:      Appearance: Normal appearance.  HENT:     Head:   Musculoskeletal:        General: Normal range of motion.     Left knee: Swelling and bony tenderness present. No deformity or crepitus. Normal range of motion. Normal alignment, normal meniscus and normal patellar mobility.  Skin:    General: Skin is warm and dry.     Capillary Refill: Capillary refill takes less than 2 seconds.  Neurological:     General: No focal deficit present.     Mental Status: She is alert and oriented to person, place, and time.  Psychiatric:        Mood and Affect: Mood normal.        Behavior: Behavior normal.        Thought Content: Thought content normal.        Judgment: Judgment normal.       Assessment & Plan:  1. Acute pain of left knee - DG Knee 1-2 Views Left; Future  2. Injury of head, initial encounter - Refused CT head despite knowing risks.  - Red flags reviewed and she was advised to go to the ER if any develop - Also needs to be seen right away after head injury while on eliquis  - Advised to follow up with Afib clinic about recent fall.   3. Gait instability - Ambulatory referral to Physical Therapy  4. Muscle strain  - Ambulatory referral to Physical Therapy  Dorothyann Peng, NP

## 2021-10-20 NOTE — Patient Instructions (Signed)
It was great meeting you today   We will xray your left knee today   I am going to set you up for physical therapy - they will call you to schedule your appointment

## 2021-10-25 ENCOUNTER — Telehealth: Payer: Self-pay | Admitting: Family Medicine

## 2021-10-25 NOTE — Telephone Encounter (Signed)
Pt would like knee xray results

## 2021-10-26 NOTE — Telephone Encounter (Signed)
Please see result note 

## 2021-11-03 ENCOUNTER — Ambulatory Visit (HOSPITAL_COMMUNITY)
Admission: RE | Admit: 2021-11-03 | Discharge: 2021-11-03 | Disposition: A | Discharge status: Home / Self Care | Payer: Medicare PPO | Source: Ambulatory Visit | Attending: Physician Assistant | Admitting: Physician Assistant

## 2021-11-03 ENCOUNTER — Other Ambulatory Visit: Payer: Self-pay

## 2021-11-03 ENCOUNTER — Encounter (HOSPITAL_COMMUNITY): Payer: Self-pay | Admitting: Physician Assistant

## 2021-11-03 VITALS — BP 118/76 | HR 51 | Ht 61.0 in | Wt 240.6 lb

## 2021-11-03 DIAGNOSIS — R0683 Snoring: Secondary | ICD-10-CM | POA: Insufficient documentation

## 2021-11-03 DIAGNOSIS — Z87891 Personal history of nicotine dependence: Secondary | ICD-10-CM | POA: Diagnosis not present

## 2021-11-03 DIAGNOSIS — I484 Atypical atrial flutter: Secondary | ICD-10-CM | POA: Insufficient documentation

## 2021-11-03 DIAGNOSIS — I480 Paroxysmal atrial fibrillation: Secondary | ICD-10-CM

## 2021-11-03 DIAGNOSIS — D6869 Other thrombophilia: Secondary | ICD-10-CM | POA: Diagnosis not present

## 2021-11-03 DIAGNOSIS — E785 Hyperlipidemia, unspecified: Secondary | ICD-10-CM | POA: Diagnosis not present

## 2021-11-03 DIAGNOSIS — Z6841 Body Mass Index (BMI) 40.0 and over, adult: Secondary | ICD-10-CM | POA: Insufficient documentation

## 2021-11-03 DIAGNOSIS — I10 Essential (primary) hypertension: Secondary | ICD-10-CM | POA: Insufficient documentation

## 2021-11-03 DIAGNOSIS — E669 Obesity, unspecified: Secondary | ICD-10-CM | POA: Insufficient documentation

## 2021-11-03 DIAGNOSIS — Z7901 Long term (current) use of anticoagulants: Secondary | ICD-10-CM | POA: Diagnosis not present

## 2021-11-03 DIAGNOSIS — R001 Bradycardia, unspecified: Secondary | ICD-10-CM | POA: Insufficient documentation

## 2021-11-03 DIAGNOSIS — Z79899 Other long term (current) drug therapy: Secondary | ICD-10-CM | POA: Insufficient documentation

## 2021-11-03 NOTE — Progress Notes (Signed)
Primary Care Physician: [name], [name], MD Primary Cardiologist: Dr Acie Fredrickson Primary Electrophysiologist: none Referring Physician: Dr Doristine Mango [name] is a 71 y.o. female with a history of HTN, HLD, and atrial fibrillation who presents for follow up in the [facility].  The patient was initially diagnosed with atrial fibrillation 04/2020 after presenting to the her PCP office with palpitations. She also wore a 30 day event monitor which showed a 7% afib burden. Patient is on Eliquis for a CHADS2VASC score of 4. Patient reports that recently she has had more episodes of palpitations at rest. She was changed to metoprolol but this did not control her heart rate as well although she was on prednisone at the time. She admits to snoring and daytime somnolence.   On follow up today, patient reports she has done well from a cardiac standpoint with no heart racing or palpitations. Her mother did pass away on 10/16/21 and she is still grieving. She did have two falls and was seen at her PCP office. She refused head CT and refuses again today. She reports her headaches are resolving.   Today, she denies symptoms of palpitations, chest pain, orthopnea, PND, dizziness, presyncope, syncope, bleeding, or neurologic sequela. The patient is tolerating medications without difficulties and is otherwise without complaint today.    Atrial Fibrillation Risk Factors:  she does have symptoms or diagnosis of sleep apnea. She declines sleep study. she does not have a history of rheumatic fever. she does not have a history of alcohol use.   she has a BMI of Body mass index is 45.46 kg/m.Marland Kitchen Filed Weights   11/03/21 0905  Weight: 109.1 kg       Family History  Problem Relation Age of Onset   Hypertension Mother    Hyperlipidemia Mother    Hyperlipidemia Father    Hypertension Father    Arthritis Father    Hyperlipidemia Sister    Cervical cancer Sister     Diabetes Sister    Heart disease Sister    Cancer Maternal Grandfather    Hypertension Paternal Grandmother    Pancreatic cancer Maternal Aunt    Breast cancer Paternal Aunt    Breast cancer Cousin      Atrial Fibrillation Management history:  Previous antiarrhythmic drugs: flecainide  Previous cardioversions: none Previous ablations: none CHADS2VASC score: 4 Anticoagulation history: Eliquis   Past Medical History:  Diagnosis Date   Abdominal pain, epigastric 12/28/2008   Centricity Description: ABDOMINAL PAIN, EPIGASTRIC Qualifier: Diagnosis of  By: Julaine Hua CMA (AAMA), [name]   Centricity Description: ABDOMINAL PAIN-EPIGASTRIC Qualifier: Diagnosis of  By: [name], [name]    Allergic rhinitis 09/16/2013   Allergy    Anemia    Anxiety    Anxiety disorder, unspecified 07/06/2017   Arthritis    Blood in stool    BMI 45.0-49.9, adult ([place]) 07/26/2016   Class 3 obesity with serious comorbidity and body mass index (BMI) of 45.0 to 49.9 in adult 03/07/2017   DDD (degenerative disc disease), lumbar    DIVERTICULOSIS-COLON 12/28/2008   Qualifier: Diagnosis of  By: [name], [name]    GERD 12/28/2008   Qualifier: Diagnosis of  By: [name], [name]    GERD (gastroesophageal reflux disease)    Headache    Hyperglycemia 03/17/2014   Hypertension    Hypokalemia 07/26/2016   IBS (irritable bowel syndrome)-C-D 07/26/2016   Obesity    Other and unspecified hyperlipidemia 03/17/2014  PERSONAL HX COLONIC POLYPS 12/28/2008   Qualifier: Diagnosis of  By: [name], [name]    Reflux esophagitis 12/28/2008   Qualifier: Diagnosis of  By: Julaine Hua CMA (AAMA), [name]     S/P lumbar spinal fusion 07/02/2012   Tubulovillous adenoma of colon 2008   Past Surgical History:  Procedure Laterality Date   ABDOMINAL HYSTERECTOMY     COLONOSCOPY  2014   laser surgery to remove scar     not sure of the year; after hyst   RADIOLOGY WITH ANESTHESIA N/A 02/25/2015   Procedure: MRI OF LUMBAR  SPINE;  Surgeon: Medication Radiologist, MD;  Location: [facility];  Service: Radiology;  Laterality: N/A;  DR. Bennie Pierini COH[facility]/MRI   SPINE SURGERY     TONSILLECTOMY AND ADENOIDECTOMY      Current Outpatient Medications  Medication Sig Dispense Refill   amLODipine (NORVASC) 5 MG tablet Take 1 tablet by mouth once daily 90 tablet 3   Aspirin-Salicylamide-Caffeine (BC FAST PAIN RELIEF) 650-195-33.3 MG PACK Take 1 tablet by mouth as needed.     atenolol (TENORMIN) 25 MG tablet Take 0.5 tablets (12.5 mg total) by mouth daily. May take an extra 1/2 tablet for breakthrough afib 120 tablet 3   azelastine (ASTELIN) 0.1 % nasal spray Place 2 sprays into both nostrils 2 (two) times daily. Use in each nostril as directed 30 mL 11   calcium carbonate (TUMS EX) 750 MG chewable tablet Chew 2 tablets by mouth daily as needed for heartburn.     cetirizine (ZYRTEC) 10 MG tablet Take 10 mg by mouth daily. Alternating with Allegra     cyclobenzaprine (FLEXERIL) 10 MG tablet Take 2.5 mg by mouth daily as needed (pain level 8 or greater).      diclofenac sodium (VOLTAREN) 1 % GEL APPLY (4G) BY TOPICAL ROUTE 4 TIMES EVERY DAY TO THE AFFECTED AREA(S) (WC PA)  1   ELIQUIS 5 MG TABS tablet Take 1 tablet by mouth twice daily 180 tablet 3   flecainide (TAMBOCOR) 100 MG tablet Take 1 tablet by mouth twice daily 60 tablet 6   fluticasone (FLONASE) 50 MCG/ACT nasal spray Use 2 spray(s) in each nostril once daily 48 g 0   guaiFENesin (MUCINEX) 600 MG 12 hr tablet Take 300 mg by mouth as needed.     hydrochlorothiazide (HYDRODIURIL) 25 MG tablet TAKE 1 TABLET BY MOUTH ONCE DAILY (DUE  FOR  FOLLOW  UP) 90 tablet 2   HYDROcodone-acetaminophen (NORCO/VICODIN) 5-325 MG tablet Take 0.5 tablets by mouth daily as needed (pain level 8 or greater). 30 tablet 0   hydrocortisone 2.5 % ointment Daily as needed. 60 g 3   hydrOXYzine (ATARAX/VISTARIL) 25 MG tablet Take 1 tablet (25 mg total) by mouth 3 (three) times daily as needed for anxiety. 90  tablet 3   Multiple Vitamins-Minerals (MULTI-VITAMIN GUMMIES PO) Take by mouth as needed.     omeprazole (PRILOSEC) 40 MG capsule Take 1 capsule (40 mg total) by mouth daily. 60 capsule 11   Polyethylene Glycol 3350 (MIRALAX PO) Take by mouth as needed.     potassium chloride (KLOR-CON) 10 MEQ tablet Take 2 tablets by mouth once daily 180 tablet 1   pravastatin (PRAVACHOL) 20 MG tablet Take by mouth twice a week 27 tablet 3   trolamine salicylate (ASPERCREME) 10 % cream Apply 1 application topically as needed for muscle pain.     No current facility-administered medications for this encounter.    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   Social Determinants of Health   Financial Resource Strain: Low Risk    Difficulty of Paying Living Expenses: Not hard at all   Food Insecurity: No Food Insecurity   Worried About Charity fundraiser in the Last Year: Never true   [place] in the Last Year: Never true  Transportation Needs: No Transportation Needs   Lack of Transportation (Medical): No   Lack of Transportation (Non-Medical): No  Physical Activity: Inactive   Days of Exercise per Week: 0 days   Minutes of Exercise per Session: 0 min  Stress: No Stress Concern Present   Feeling of Stress : Only a little  Social Connections: Moderately Isolated   Frequency of Communication with Friends and Family: Twice a week   Frequency of Social Gatherings with Friends and Family: Once a week   Attends Religious Services: 1 to 4 times per year   Active Member of [employer] or Organizations: No   Attends Archivist Meetings: Never   Marital Status: Widowed  Human resources officer Violence: Not At Risk   Fear of Current or Ex-Partner: No   Emotionally Abused: No   Physically Abused: No   Sexually Abused: No     ROS- All systems are reviewed and negative except as per the HPI above.  Physical Exam: Vitals:   11/03/21 0905  BP: 118/76  Pulse: (!) 51  Weight: 109.1 kg  Height: 5\' 1"  (1.549 m)    GEN- The patient is a well appearing obese female, alert and oriented x 3 today.   HEENT-head normocephalic, atraumatic, sclera clear, conjunctiva pink, hearing intact, trachea midline. Lungs- Clear to ausculation bilaterally, normal work of breathing Heart- Regular rate and rhythm, bradycardia, no murmurs, rubs or gallops  GI- soft, NT, ND, + BS Extremities- no clubbing, cyanosis, or edema MS- no significant deformity or atrophy Skin- no rash or lesion Psych- euthymic mood, full affect Neuro- strength and sensation are intact   Wt Readings from Last 3 Encounters:  11/03/21 109.1 kg  10/20/21 108.4 kg  09/27/21  110.4 kg    EKG today demonstrates  SB Vent. rate 51 BPM PR interval 178 ms QRS duration 96 ms QT/QTcB 470/433 ms  Echo 05/11/20  demonstrated   1. Left ventricular ejection fraction, by estimation, is 60 to 65%. The left ventricle has normal function. The left ventricle has no regional wall motion abnormalities. Left ventricular diastolic parameters were normal.   2. Right ventricular systolic function is normal. The right ventricular  size is normal. There is normal pulmonary artery systolic pressure.   3. The mitral valve is normal in structure. Trivial mitral valve  regurgitation. No evidence of mitral stenosis.   4. The aortic valve is tricuspid. Aortic valve regurgitation is not  visualized. Mild aortic valve sclerosis is present, with no evidence of aortic valve stenosis.   5. The inferior vena cava is normal in size with greater than 50%  respiratory variability, suggesting right atrial pressure of 3 mmHg.   Epic records are reviewed at length today  CHA2DS2-VASc Score = 4  The patient's score is based upon: CHF History: 0 HTN History: 1 Diabetes History: 1 Stroke History: 0 Vascular Disease History: 0 Age Score: 1 Gender Score: 1          ASSESSMENT AND PLAN: 1. Paroxysmal Atrial Fibrillation/atypical atrial flutter The patient's CHA2DS2-VASc score is 4, indicating a 4.8% annual risk of stroke.   Patient appears to be maintaining SR.  Continue flecainide 100 mg BID Continue Eliquis 5 mg BID Continue atenolol 12.5 mg daily with an extra 12.5 mg for heart racing.  2. Secondary Hypercoagulable State (ICD10:  D68.69) The patient is at significant risk for stroke/thromboembolism based upon her CHA2DS2-VASc Score of 4.  Continue Apixaban (Eliquis).   3. Obesity Lifestyle modification was discussed and encouraged including regular physical activity and weight reduction.  4. HTN Stable, no changes today.  5. Snoring/daytime somnolence She continues to decline testing.  6. Bradycardia Asymptomatic, needs to remain on AV node agent to oppose flecainide.    Follow up with cardiology APP per  recall. AF clinic in 6 months.    [facility] [address] [place], [place] [address] [phone] 11/03/2021 10:54 AM

## 2021-11-17 ENCOUNTER — Telehealth: Payer: Self-pay | Admitting: Family Medicine

## 2021-11-17 DIAGNOSIS — L84 Corns and callosities: Secondary | ICD-10-CM

## 2021-11-17 DIAGNOSIS — M79672 Pain in left foot: Secondary | ICD-10-CM

## 2021-11-17 NOTE — Telephone Encounter (Signed)
Patient called in requesting a referral for her foot.  Patient could be contacted at [phone].  Please advise.

## 2021-11-18 NOTE — Telephone Encounter (Signed)
Referral can be placed but more detail is needed. What is the concern or symptoms for which she thinks she needs a podiatrist? Thanks, BJ

## 2021-11-21 NOTE — Telephone Encounter (Signed)
I called and spoke with patient. She has been having foot pain and difficulty walking since her fall. She also has calluses on her feet. Referral placed & pt is aware that they will call her to schedule the appointment.

## 2021-11-29 ENCOUNTER — Ambulatory Visit (INDEPENDENT_AMBULATORY_CARE_PROVIDER_SITE_OTHER): Payer: Medicare PPO | Admitting: Podiatry

## 2021-11-29 ENCOUNTER — Other Ambulatory Visit: Payer: Self-pay

## 2021-11-29 ENCOUNTER — Ambulatory Visit: Payer: Medicare PPO | Admitting: Podiatry

## 2021-11-29 ENCOUNTER — Ambulatory Visit (INDEPENDENT_AMBULATORY_CARE_PROVIDER_SITE_OTHER): Payer: Medicare PPO

## 2021-11-29 DIAGNOSIS — B351 Tinea unguium: Secondary | ICD-10-CM

## 2021-11-29 DIAGNOSIS — L84 Corns and callosities: Secondary | ICD-10-CM | POA: Diagnosis not present

## 2021-11-29 DIAGNOSIS — S90852A Superficial foreign body, left foot, initial encounter: Secondary | ICD-10-CM

## 2021-11-29 DIAGNOSIS — Z7901 Long term (current) use of anticoagulants: Secondary | ICD-10-CM | POA: Diagnosis not present

## 2021-11-29 DIAGNOSIS — M79674 Pain in right toe(s): Secondary | ICD-10-CM | POA: Diagnosis not present

## 2021-11-29 DIAGNOSIS — S90859A Superficial foreign body, unspecified foot, initial encounter: Secondary | ICD-10-CM

## 2021-11-29 DIAGNOSIS — M79675 Pain in left toe(s): Secondary | ICD-10-CM | POA: Diagnosis not present

## 2021-11-29 DIAGNOSIS — M79672 Pain in left foot: Secondary | ICD-10-CM

## 2021-11-29 MED ORDER — EFINACONAZOLE 10 % EX SOLN: 1.0000 [drp] | Freq: Every day | CUTANEOUS | 11 refills | Status: DC

## 2021-11-29 NOTE — Patient Instructions (Signed)
Efinaconazole Topical Solution °What is this medication? °EFINACONAZOLE (e FEE na KON a zole) is an antifungal medicine. It is used to treat certain kinds of fungal infections of the toenail. °This medicine may be used for other purposes; ask your health care provider or pharmacist if you have questions. °COMMON BRAND NAME(S): JUBLIA °What should I tell my care team before I take this medication? °They need to know if you have any of these conditions: °an unusual or allergic reaction to efinaconazole, other medicines, foods, dyes or preservatives °pregnant or trying to get pregnant °breast-feeding °How should I use this medication? °This medicine is for external use only. Do not take by mouth. Follow the directions on the label. Wash hands before and after use. Apply this medicine using the provided brush to cover the entire toenail. Do not use your medicine more often than directed. Finish the full course prescribed by your doctor or health care professional even if you think your condition is better. Do not stop using except on the advice of your doctor or health care professional. °Talk to your pediatrician regarding the use of this medicine in children. While this drug may be prescribed for children as young as 6 years for selected conditions, precautions do apply. °Overdosage: If you think you have taken too much of this medicine contact a poison control center or emergency room at once. °NOTE: This medicine is only for you. Do not share this medicine with others. °What if I miss a dose? °If you miss a dose, use it as soon as you can. If it is almost time for your next dose, use only that dose. Do not use double or extra doses. °What may interact with this medication? °Interactions have not been studied. Do not use any other nail products (i.e., nail polish, pedicures) during treatment with this medicine. °This list may not describe all possible interactions. Give your health care provider a list of all the  medicines, herbs, non-prescription drugs, or dietary supplements you use. Also tell them if you smoke, drink alcohol, or use illegal drugs. Some items may interact with your medicine. °What should I watch for while using this medication? °Do not get this medicine in your eyes. If you do, rinse out with plenty of cool tap water. °Tell your doctor or health care professional if your symptoms do not start to get better or if they get worse. °Wait for at least 10 minutes after bathing before applying this medication. After bathing, make sure that your feet are very dry. Fungal infections like moist conditions. Do not walk around barefoot. To help prevent reinfection, wear freshly washed cotton, not synthetic clothing. Tell your doctor or health care professional if you develop sores or blisters that do not heal properly. If your nail infection returns after you stop using this medicine, contact your doctor or health care professional. °What side effects may I notice from receiving this medication? °Side effects that you should report to your doctor or health care professional as soon as possible: °allergic reactions like skin rash, itching or hives, swelling of the face, lips, or tongue °ingrown toenail °Side effects that usually do not require medical attention (report to your doctor or health care professional if they continue or are bothersome): °mild skin irritation, burning, or itching °This list may not describe all possible side effects. Call your doctor for medical advice about side effects. You may report side effects to FDA at 1-800-FDA-1088. °Where should I keep my medication? °Keep out of the   reach of children. °Store at room temperature between 20 and 25 degrees C (68 and 77 degrees F). Keep this medicine in the original container. Throw away any unused medicine after the expiration date. °This medicine is flammable. Avoid exposure to heat, fire, flame, and smoking. °NOTE: This sheet is a summary. It may  not cover all possible information. If you have questions about this medicine, talk to your doctor, pharmacist, or health care provider. °© 2022 Elsevier/Gold Standard (2019-02-05 00:00:00) ° °

## 2021-12-02 ENCOUNTER — Other Ambulatory Visit: Payer: Self-pay | Admitting: Podiatry

## 2021-12-02 MED ORDER — TAVABOROLE 5 % EX SOLN: 1.0000 "application " | Freq: Every day | CUTANEOUS | 2 refills | Status: DC

## 2021-12-02 NOTE — Progress Notes (Signed)
Sent tavabrole as jublia not covered.

## 2021-12-04 NOTE — Progress Notes (Signed)
Subjective:   Patient ID: [name], female   DOB: 71 y.o.   MRN: [id number]   HPI 71 year old female presents the office today for concerns of callus to her feet as well as dry, cracking skin.  Also some of the nails be trimmed today.  She states that the callus started after she had a fall in her left leg.  She feels that she is stepping on something at times.  Denies any open sores or any swelling or redness or any drainage.  No other concerns.   Review of Systems  All other systems reviewed and are negative.  Past Medical History:  Diagnosis Date   Abdominal pain, epigastric 12/28/2008   Centricity Description: ABDOMINAL PAIN, EPIGASTRIC Qualifier: Diagnosis of  By: [name], [name]   Centricity Description: ABDOMINAL PAIN-EPIGASTRIC Qualifier: Diagnosis of  By: [name], [name]    Allergic rhinitis 09/16/2013   Allergy    Anemia    Anxiety    Anxiety disorder, unspecified 07/06/2017   Arthritis    Blood in stool    BMI 45.0-49.9, adult ([place]) 07/26/2016   Class 3 obesity with serious comorbidity and body mass index (BMI) of 45.0 to 49.9 in adult 03/07/2017   DDD (degenerative disc disease), lumbar    DIVERTICULOSIS-COLON 12/28/2008   Qualifier: Diagnosis of  By: [name], [name]    GERD 12/28/2008   Qualifier: Diagnosis of  By: [name], [name]    GERD (gastroesophageal reflux disease)    Headache    Hyperglycemia 03/17/2014   Hypertension    Hypokalemia 07/26/2016   IBS (irritable bowel syndrome)-C-D 07/26/2016   Obesity    Other and unspecified hyperlipidemia 03/17/2014   PERSONAL HX COLONIC POLYPS 12/28/2008   Qualifier: Diagnosis of  By: [name], [name]    Reflux esophagitis 12/28/2008   Qualifier: Diagnosis of  By: [name], [name]     S/P lumbar spinal fusion 07/02/2012   Tubulovillous adenoma of colon 2008    Past Surgical History:  Procedure Laterality Date   ABDOMINAL HYSTERECTOMY     COLONOSCOPY  2014   laser  surgery to remove scar     not sure of the year; after hyst   RADIOLOGY WITH ANESTHESIA N/A 02/25/2015   Procedure: MRI OF LUMBAR SPINE;  Surgeon: Medication Radiologist, MD;  Location: [facility];  Service: Radiology;  Laterality: N/A;  DR. Bennie Pierini COHEN/MRI   SPINE SURGERY     TONSILLECTOMY AND ADENOIDECTOMY       Current Outpatient Medications:    Efinaconazole 10 % SOLN, Apply 1 drop topically daily., Disp: 4 mL, Rfl: 11   amLODipine (NORVASC) 5 MG tablet, Take 1 tablet by mouth once daily, Disp: 90 tablet, Rfl: 3   Aspirin-Salicylamide-Caffeine (BC FAST PAIN RELIEF) 650-195-33.3 MG PACK, Take 1 tablet by mouth as needed., Disp: , Rfl:    atenolol (TENORMIN) 25 MG tablet, Take 0.5 tablets (12.5 mg total) by mouth daily. May take an extra 1/2 tablet for breakthrough afib, Disp: 120 tablet, Rfl: 3   azelastine (ASTELIN) 0.1 % nasal spray, Place 2 sprays into both nostrils 2 (two) times daily. Use in each nostril as directed, Disp: 30 mL, Rfl: 11   calcium carbonate (TUMS EX) 750 MG chewable tablet, Chew 2 tablets by mouth daily as needed for heartburn., Disp: , Rfl:    cetirizine (ZYRTEC) 10 MG tablet, Take 10 mg by mouth daily. Alternating with Allegra, Disp: , Rfl:    cyclobenzaprine (FLEXERIL)  10 MG tablet, Take 2.5 mg by mouth daily as needed (pain level 8 or greater). , Disp: , Rfl:    diclofenac sodium (VOLTAREN) 1 % GEL, APPLY (4G) BY TOPICAL ROUTE 4 TIMES EVERY DAY TO THE AFFECTED AREA(S) (WC PA), Disp: , Rfl: 1   ELIQUIS 5 MG TABS tablet, Take 1 tablet by mouth twice daily, Disp: 180 tablet, Rfl: 3   flecainide (TAMBOCOR) 100 MG tablet, Take 1 tablet by mouth twice daily, Disp: 60 tablet, Rfl: 6   fluticasone (FLONASE) 50 MCG/ACT nasal spray, Use 2 spray(s) in each nostril once daily, Disp: 48 g, Rfl: 0   guaiFENesin (MUCINEX) 600 MG 12 hr tablet, Take 300 mg by mouth as needed., Disp: , Rfl:    hydrochlorothiazide (HYDRODIURIL) 25 MG tablet, TAKE 1 TABLET BY MOUTH ONCE DAILY (DUE  FOR   FOLLOW  UP), Disp: 90 tablet, Rfl: 2   HYDROcodone-acetaminophen (NORCO/VICODIN) 5-325 MG tablet, Take 0.5 tablets by mouth daily as needed (pain level 8 or greater)., Disp: 30 tablet, Rfl: 0   hydrocortisone 2.5 % ointment, Daily as needed., Disp: 60 g, Rfl: 3   hydrOXYzine (ATARAX/VISTARIL) 25 MG tablet, Take 1 tablet (25 mg total) by mouth 3 (three) times daily as needed for anxiety., Disp: 90 tablet, Rfl: 3   Multiple Vitamins-Minerals (MULTI-VITAMIN GUMMIES PO), Take by mouth as needed., Disp: , Rfl:    omeprazole (PRILOSEC) 40 MG capsule, Take 1 capsule (40 mg total) by mouth daily., Disp: 60 capsule, Rfl: 11   Polyethylene Glycol 3350 (MIRALAX PO), Take by mouth as needed., Disp: , Rfl:    potassium chloride (KLOR-CON) 10 MEQ tablet, Take 2 tablets by mouth once daily, Disp: 180 tablet, Rfl: 1   pravastatin (PRAVACHOL) 20 MG tablet, Take by mouth twice a week, Disp: 27 tablet, Rfl: 3   Tavaborole 5 % SOLN, Apply 1 application topically daily., Disp: 10 mL, Rfl: 2   trolamine salicylate (ASPERCREME) 10 % cream, Apply 1 application topically as needed for muscle pain., Disp: , Rfl:   Allergies  Allergen Reactions   Crestor [Rosuvastatin]    Erythromycin     rash   Lansoprazole    Penicillins     rash   Potassium-Containing Compounds Other (See Comments)    Stomach upset, can take pills, but not the packets    Robaxin [Methocarbamol]     Messed up stomach   Sulfonamide Derivatives     rash   Trental [Pentoxifylline Er]     Bad headache          Objective:  Physical Exam  General: AAO x3, NAD  Dermatological: Nails are hypertrophic, dystrophic, brittle, discolored, elongated 10. No surrounding redness or drainage. Tenderness nails 1-5 bilaterally.  Multiple punctate annular hyperkeratotic lesions present plantar aspect of foot left side worse than right mostly centralized on the plantar lateral aspect near the heel.  There is no underlying ulceration drainage or signs of  infection there is no foreign body noted.  No open lesions or pre-ulcerative lesions are identified today.  Vascular: Dorsalis Pedis artery and Posterior Tibial artery pedal pulses are palpable bilateral with immedate capillary fill time. There is no pain with calf compression, swelling, warmth, erythema.   Neruologic: Grossly intact via light touch bilateral.  Musculoskeletal: Tenderness of the hyperkeratotic lesions.  No other areas of discomfort.  Muscular strength 5/5 in all groups tested bilateral.  Gait: Unassisted, Nonantalgic.       Assessment:   Symptomatic hyperkeratotic lesions, onychomycosis  Plan:  -Treatment options discussed including all alternatives, risks, and complications -Etiology of symptoms were discussed -X-rays were obtained and reviewed with the patient.  No evidence of acute fracture or foreign body. -Sharply debrided multiple annular hyperkeratotic lesions without any complications or bleeding.  Recommend moisturizer and offloading daily. -Sharply debrided the nails x10 without any complications or bleeding  Return in about 9 weeks (around 01/31/2022).  Trula Slade DPM

## 2021-12-11 ENCOUNTER — Encounter: Payer: Self-pay | Admitting: Cardiovascular Disease

## 2021-12-11 NOTE — Progress Notes (Signed)
Cardiology Office Note:    Date:  12/12/2021   ID:  [name], [name] 22-Sep-1951, MRN [id number]  PCP:  [name], [name], MD  Cardiologist:  Mertie Moores, MD    Referring MD: [name], [name], MD   Chief Complaint  Patient presents with   Atrial Fibrillation   Hypertension        Problem list 1.  Sinus bradycardia 2.  Essential hypertension  10/16/17    [name] is a 71 y.o. female with a hx of  HTN. She has been on Atenolol - dose has been gradually decreased due to sinus bradycardia .  She was started on Lotrel ( amlodipine - benazepril  10-20 mg a day )  The Lotrel has caused her legs to swell and has pain in her bladder when she urinates.   Has also developed chills since she has been taking the Lotrel .   Has never had any syncope.   Has occasional "swimmy headedness"  and orthostasis .   Family hx of MI and strokes in her family   She eats fast foods and prepared foods 3-4 times a week. Walks every other day  - 1/4 mile a day   She fell on Dec. 11 ( was pushing her mother up a wheelchair ramp and fell in the ice )   December 05, 2017: [name] was seen today for follow-up of her high blood pressure.  Her blood pressure has been much better only. Is eating better,  Is sweating lots.    Was told to drink Gatorade several times a week.  Wt is 244 .   Has lost 9 lbs   Has not working out regularly  Has had some ortho  issues  Is eating out much less.   April 21, 2020:  Seen back after ~ 2 year absence  Has no energy. Had palpitations last night - lasted a few minutes.  Was in the bed so no CP , no dyspnea Has lost 4 lbs this past week .  Labs from her primary MD were reveiwed and look ok  Takes a teaspoon of salt 1-2 times a week for leg cramps.   She is now on some leg cramp medicine .  Was advised not to eat salt.  She eats fast foods 3 times a week,  [employer], [employer], [employer], cook out  [employer] several times a week Eats  bologna, sausages  Aug. 3, 2022:  [name] is seen for follow up of her Afib  Is in Afib today , in on Eliquis   BP is well controlled.  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  She is not interested in starting anything else at this time .   December 12, 2021: [name] is seen for follow up of her atrial fib Is on eliqis Her mother passed away in 02-Nov-2021 Was seen in the Afib clinic on Jan. 26, 2023 She was in NSR at the time On flecainide, atenolol 12.5 QD ( with an occasional extra 1/2 tab)  Wt is 245  Has fallen twice , is not sure she can walk now   Past Medical History:  Diagnosis Date   Abdominal pain, epigastric 12/28/2008   Centricity Description: ABDOMINAL  PAIN, EPIGASTRIC Qualifier: Diagnosis of  By: Julaine Hua CMA (AAMA), [name]   Centricity Description: ABDOMINAL PAIN-EPIGASTRIC Qualifier: Diagnosis of  By: [name], [name]    Allergic rhinitis 09/16/2013   Allergy    Anemia    Anxiety    Anxiety disorder, unspecified 07/06/2017   Arthritis    Blood in stool    BMI 45.0-49.9, adult ([place]) 07/26/2016   Class 3 obesity with serious comorbidity and body mass index (BMI) of 45.0 to 49.9 in adult 03/07/2017   DDD (degenerative disc disease), lumbar    DIVERTICULOSIS-COLON 12/28/2008   Qualifier: Diagnosis of  By: [name], [name]    GERD 12/28/2008   Qualifier: Diagnosis of  By: [name], [name]    GERD (gastroesophageal reflux disease)    Headache    Hyperglycemia 03/17/2014   Hypertension    Hypokalemia 07/26/2016   IBS (irritable bowel syndrome)-C-D 07/26/2016   Obesity    Other and unspecified hyperlipidemia 03/17/2014   PERSONAL HX COLONIC POLYPS 12/28/2008   Qualifier: Diagnosis of  By: [name], [name]    Reflux esophagitis 12/28/2008    Qualifier: Diagnosis of  By: [name], [name]     S/P lumbar spinal fusion 07/02/2012   Tubulovillous adenoma of colon 2008    Past Surgical History:  Procedure Laterality Date   ABDOMINAL HYSTERECTOMY     COLONOSCOPY  2014   laser surgery to remove scar     not sure of the year; after hyst   RADIOLOGY WITH ANESTHESIA N/A 02/25/2015   Procedure: MRI OF LUMBAR SPINE;  Surgeon: Medication Radiologist, MD;  Location: [facility];  Service: Radiology;  Laterality: N/A;  DR. Bennie Pierini COHEN/MRI   SPINE SURGERY     TONSILLECTOMY AND ADENOIDECTOMY      Current Medications: Current Meds  Medication Sig   amLODipine (NORVASC) 5 MG tablet Take 1 tablet by mouth once daily   Aspirin-Salicylamide-Caffeine (BC FAST PAIN RELIEF) 650-195-33.3 MG PACK Take 1 tablet by mouth as needed.   atenolol (TENORMIN) 25 MG tablet Take 0.5 tablets (12.5 mg total) by mouth daily. May take an extra 1/2 tablet for breakthrough afib   azelastine (ASTELIN) 0.1 % nasal spray Place 2 sprays into both nostrils 2 (two) times daily. Use in each nostril as directed   calcium carbonate (TUMS EX) 750 MG chewable tablet Chew 2 tablets by mouth daily as needed for heartburn.   cetirizine (ZYRTEC) 10 MG tablet Take 10 mg by mouth daily. Alternating with Allegra   cyclobenzaprine (FLEXERIL) 10 MG tablet Take 2.5 mg by mouth daily as needed (pain level 8 or greater).    diclofenac sodium (VOLTAREN) 1 % GEL APPLY (4G) BY TOPICAL ROUTE 4 TIMES EVERY DAY TO THE AFFECTED AREA(S) (WC PA)   ELIQUIS 5 MG TABS tablet Take 1 tablet by mouth twice daily   flecainide (TAMBOCOR) 100 MG tablet Take 1 tablet by mouth twice daily   fluticasone (FLONASE) 50 MCG/ACT nasal spray Use 2 spray(s) in each nostril once daily   hydrochlorothiazide (HYDRODIURIL) 25 MG tablet TAKE 1 TABLET BY MOUTH ONCE DAILY (DUE  FOR  FOLLOW  UP)   HYDROcodone-acetaminophen (NORCO/VICODIN) 5-325 MG tablet Take 0.5 tablets by mouth daily as needed (pain level 8 or greater).    hydrocortisone 2.5 % ointment Daily as needed.   hydrOXYzine (ATARAX/VISTARIL) 25 MG tablet Take 1 tablet (25 mg total) by mouth 3 (three) times daily as needed for anxiety.   Multiple Vitamins-Minerals (MULTI-VITAMIN GUMMIES PO)  Take by mouth as needed.   Olopatadine HCl (PATADAY) 0.2 % SOLN Apply to eye daily.   Polyethylene Glycol 3350 (MIRALAX PO) Take by mouth as needed.   potassium chloride (KLOR-CON) 10 MEQ tablet Take 2 tablets by mouth once daily   pravastatin (PRAVACHOL) 20 MG tablet Take by mouth twice a week   Tavaborole 5 % SOLN Apply 1 application topically daily.   trolamine salicylate (ASPERCREME) 10 % cream Apply 1 application topically as needed for muscle pain.     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   Social Determinants of Health   Financial Resource Strain: Low Risk    Difficulty of Paying Living Expenses: Not hard at all  Food Insecurity: No Food Insecurity   Worried About Charity fundraiser in the Last Year:  Never true   [place] in the Last Year: Never true  Transportation Needs: No Transportation Needs   Lack of Transportation (Medical): No   Lack of Transportation (Non-Medical): No  Physical Activity: Inactive   Days of Exercise per Week: 0 days   Minutes of Exercise per Session: 0 min  Stress: No Stress Concern Present   Feeling of Stress : Only a little  Social Connections: Moderately Isolated   Frequency of Communication with Friends and Family: Twice a week   Frequency of Social Gatherings with Friends and Family: Once a week   Attends Religious Services: 1 to 4 times per year   Active Member of [employer] or Organizations: No   Attends Archivist Meetings: Never   Marital Status: Widowed     Family History: The patient's family history includes Arthritis in her father; Breast cancer in her cousin and paternal aunt; Cancer in her maternal grandfather; Cervical cancer in her sister; Diabetes in her sister; Heart disease in her sister; Hyperlipidemia in her father, mother, and sister; Hypertension in her father, mother, and paternal grandmother; Pancreatic cancer in her maternal aunt.  ROS:   As per current hx. Otherwise negatve    EKGs/Labs/Other Studies Reviewed:    The following studies were reviewed today:    Recent Labs: 08/17/2021: ALT 21; BUN 21; Creatinine, Ser 0.99; Potassium 3.8; Sodium 142; TSH 2.68  Recent Lipid Panel    Component Value Date/Time   CHOL 158 08/17/2021 1004   TRIG 89.0 08/17/2021 1004   HDL 54.10 08/17/2021 1004   CHOLHDL 3 08/17/2021 1004  VLDL 17.8 08/17/2021 1004   LDLCALC 86 08/17/2021 1004   Physical Exam: Blood pressure 132/70, pulse (!) 55, height '5\' 1"'$  (1.549 m), weight 245 lb (111.1 kg), SpO2 97 %.  GEN:  middle age,  4 obese female , in no acute distress HEENT: Normal NECK: No JVD; No carotid bruits LYMPHATICS: No lymphadenopathy CARDIAC: RRR , no murmurs, rubs, gallops RESPIRATORY:  Clear to auscultation  without rales, wheezing or rhonchi  ABDOMEN: Soft, non-tender, non-distended MUSCULOSKELETAL:  No edema; No deformity  SKIN: Warm and dry NEUROLOGIC:  Alert and oriented x 3    EKG    ASSESSMENT:    No diagnosis found.   PLAN:    1.  Atrial fib:    Is in NSR currently Cont eliquis    2.  Essential hypertension:    BP is well controlled.    3.  Obesity:   advised more exercise    4.  Fatigue:  she has been taking care of her mother for years.  Her mother just passed away and so she is slowly getting some of her life back. It will likely take a while for her to get fully back into a normal life. Encouraged her to get out more, exercise, join a support group etc.     Medication Adjustments/Labs and Tests Ordered: Current medicines are reviewed at length with the patient today.  Concerns regarding medicines are outlined above.  No orders of the defined types were placed in this encounter.   No orders of the defined types were placed in this encounter.    Signed, Mertie Moores, MD  12/12/2021 8:39 PM    [place]

## 2021-12-12 ENCOUNTER — Ambulatory Visit: Payer: Medicare PPO | Admitting: Cardiovascular Disease

## 2021-12-12 ENCOUNTER — Encounter: Payer: Self-pay | Admitting: Cardiovascular Disease

## 2021-12-12 ENCOUNTER — Other Ambulatory Visit: Payer: Self-pay

## 2021-12-12 VITALS — BP 132/70 | HR 55 | Ht 61.0 in | Wt 245.0 lb

## 2021-12-12 DIAGNOSIS — I480 Paroxysmal atrial fibrillation: Secondary | ICD-10-CM

## 2021-12-12 DIAGNOSIS — I10 Essential (primary) hypertension: Secondary | ICD-10-CM | POA: Diagnosis not present

## 2021-12-12 NOTE — Patient Instructions (Signed)
Medication Instructions:  ?Your physician recommends that you continue on your current medications as directed. Please refer to the Current Medication list given to you today. ? ?*If you need a refill on your cardiac medications before your next appointment, please call your pharmacy* ? ? ?Lab Work: ?NONE ?If you have labs (blood work) drawn today and your tests are completely normal, you will receive your results only by: ?MyChart Message (if you have MyChart) OR ?A paper copy in the mail ?If you have any lab test that is abnormal or we need to change your treatment, we will call you to review the results. ? ?Testing/Procedures: ?NONE ? ?Follow-Up: ?At [facility], you and your health needs are our priority.  As part of our continuing mission to provide you with exceptional heart care, we have created designated Provider Care Teams.  These Care Teams include your primary Cardiologist (physician) and Advanced Practice Providers (APPs -  Physician Assistants and Nurse Practitioners) who all work together to provide you with the care you need, when you need it. ? ?Your next appointment:   ?1 year(s) ? ?The format for your next appointment:   ?In Person ? ?Provider:   ?Mertie Moores, MD  or Robbie Lis, PA-C, Christen Bame, NP, or Richardson Dopp, PA-C ?

## 2022-01-14 NOTE — Progress Notes (Deleted)
? ?HPI: ?No chief complaint on file. ? ? ?[name] is a 71 y.o. female, who is here today for chronic disease management. ?Last seen on 10/20/21 for acute visit. ?Since her last visit she has followed with podiatrist and cardiologist. ? ?Hypertension:  ?Medications:*** ?BP readings at home:*** ?Side effects:*** ? ?Negative for unusual or severe headache, visual changes, exertional chest pain, dyspnea,  focal weakness, or edema. ? ?Lab Results  ?Component Value Date  ? CREATININE 0.99 08/17/2021  ? BUN 21 08/17/2021  ? NA 142 08/17/2021  ? K 3.8 08/17/2021  ? CL 102 08/17/2021  ? CO2 32 08/17/2021  ? ? ?Diabetes Mellitus II:  ?- Checking BG at home: *** ?- Medications: *** ?- Compliance: *** ?- eye exam: *** ?- foot exam: *** ? ?- Negative for symptoms of hypoglycemia, polyuria, polydipsia, numbness extremities, foot ulcers/trauma ? ?Lab Results  ?Component Value Date  ? HGBA1C 6.8 (H) 08/17/2021  ? ?Lab Results  ?Component Value Date  ? MICROALBUR <0.7 08/17/2021  ? ?Review of Systems ?Rest of ROS see pertinent positives and negatives in HPI. ? ?Current Outpatient Medications on File Prior to Visit  ?Medication Sig Dispense Refill  ? amLODipine (NORVASC) 5 MG tablet Take 1 tablet by mouth once daily 90 tablet 3  ? Aspirin-Salicylamide-Caffeine (BC FAST PAIN RELIEF) 650-195-33.3 MG PACK Take 1 tablet by mouth as needed.    ? atenolol (TENORMIN) 25 MG tablet Take 0.5 tablets (12.5 mg total) by mouth daily. May take an extra 1/2 tablet for breakthrough afib 120 tablet 3  ? azelastine (ASTELIN) 0.1 % nasal spray Place 2 sprays into both nostrils 2 (two) times daily. Use in each nostril as directed 30 mL 11  ? calcium carbonate (TUMS EX) 750 MG chewable tablet Chew 2 tablets by mouth daily as needed for heartburn.    ? cetirizine (ZYRTEC) 10 MG tablet Take 10 mg by mouth daily. Alternating with Allegra    ? cyclobenzaprine (FLEXERIL) 10 MG tablet Take 2.5 mg by mouth daily as needed (pain level 8 or  greater).     ? diclofenac sodium (VOLTAREN) 1 % GEL APPLY (4G) BY TOPICAL ROUTE 4 TIMES EVERY DAY TO THE AFFECTED AREA(S) (WC PA)  1  ? ELIQUIS 5 MG TABS tablet Take 1 tablet by mouth twice daily 180 tablet 3  ? flecainide (TAMBOCOR) 100 MG tablet Take 1 tablet by mouth twice daily 60 tablet 6  ? fluticasone (FLONASE) 50 MCG/ACT nasal spray Use 2 spray(s) in each nostril once daily 48 g 0  ? hydrochlorothiazide (HYDRODIURIL) 25 MG tablet TAKE 1 TABLET BY MOUTH ONCE DAILY (DUE  FOR  FOLLOW  UP) 90 tablet 2  ? HYDROcodone-acetaminophen (NORCO/VICODIN) 5-325 MG tablet Take 0.5 tablets by mouth daily as needed (pain level 8 or greater). 30 tablet 0  ? hydrocortisone 2.5 % ointment Daily as needed. 60 g 3  ? hydrOXYzine (ATARAX/VISTARIL) 25 MG tablet Take 1 tablet (25 mg total) by mouth 3 (three) times daily as needed for anxiety. 90 tablet 3  ? Multiple Vitamins-Minerals (MULTI-VITAMIN GUMMIES PO) Take by mouth as needed.    ? Olopatadine HCl (PATADAY) 0.2 % SOLN Apply to eye daily.    ? omeprazole (PRILOSEC) 40 MG capsule Take 1 capsule (40 mg total) by mouth daily. (Patient taking differently: Take 80 mg by mouth daily. 1 capsule 2 times a day) 60 capsule 11  ? Polyethylene Glycol 3350 (MIRALAX PO) Take by mouth as needed.    ? potassium  chloride (KLOR-CON) 10 MEQ tablet Take 2 tablets by mouth once daily 180 tablet 1  ? pravastatin (PRAVACHOL) 20 MG tablet Take by mouth twice a week 27 tablet 3  ? Tavaborole 5 % SOLN Apply 1 application topically daily. 10 mL 2  ? trolamine salicylate (ASPERCREME) 10 % cream Apply 1 application topically as needed for muscle pain.    ? ?No current facility-administered medications on file prior to visit.  ? ? ?Past Medical History:  ?Diagnosis Date  ? Abdominal pain, epigastric 12/28/2008  ? Centricity Description: ABDOMINAL PAIN, EPIGASTRIC Qualifier: Diagnosis of  By: [name], [name]   Centricity Description: ABDOMINAL PAIN-EPIGASTRIC Qualifier: Diagnosis of  By:  [name], [name]   ? Allergic rhinitis 09/16/2013  ? Allergy   ? Anemia   ? Anxiety   ? Anxiety disorder, unspecified 07/06/2017  ? Arthritis   ? Blood in stool   ? BMI 45.0-49.9, adult ([place]) 07/26/2016  ? Class 3 obesity with serious comorbidity and body mass index (BMI) of 45.0 to 49.9 in adult 03/07/2017  ? DDD (degenerative disc disease), lumbar   ? DIVERTICULOSIS-COLON 12/28/2008  ? Qualifier: Diagnosis of  By: [name], [name]   ? GERD 12/28/2008  ? Qualifier: Diagnosis of  By: [name], [name]   ? GERD (gastroesophageal reflux disease)   ? Headache   ? Hyperglycemia 03/17/2014  ? Hypertension   ? Hypokalemia 07/26/2016  ? IBS (irritable bowel syndrome)-C-D 07/26/2016  ? Obesity   ? Other and unspecified hyperlipidemia 03/17/2014  ? PERSONAL HX COLONIC POLYPS 12/28/2008  ? Qualifier: Diagnosis of  By: [name], [name]   ? Reflux esophagitis 12/28/2008  ? Qualifier: Diagnosis of  By: [name], [name]    ? S/P lumbar spinal fusion 07/02/2012  ? Tubulovillous adenoma of colon 2007/01/01  ? ? ?Allergies  ?Allergen Reactions  ? Crestor [Rosuvastatin]   ? Erythromycin   ?  rash  ? Lansoprazole   ? Penicillins   ?  rash  ? Potassium-Containing Compounds Other (See Comments)  ?  Stomach upset, can take pills, but not the packets ?  ? Robaxin [Methocarbamol]   ?  Messed up stomach  ? Sulfonamide Derivatives   ?  rash  ? Trental [Pentoxifylline Er]   ?  Bad headache  ? ? ?Social History  ? ?Socioeconomic History  ? Marital status: Widowed  ?  Spouse name: Not on file  ? Number of children: Not on file  ? Years of education: Not on file  ? Highest education level: Not on file  ?Occupational History  ? 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 Smoking status: Former  ?  Packs/day: 1.00  ?  Years: 38.00  ?  Pack years: 38.00  ?  Types: Cigarettes  ?  Quit date: 10/28/1997  ?  Years since quitting: 24.2  ? Smokeless tobacco: Never  ? Tobacco comments:  ?  Former smoker 06/06/21  ?Vaping Use  ?  Vaping Use: Never used  ?Substance and Sexual Activity  ? Alcohol use: No  ? Drug use: No  ? Sexual activity: Not Currently  ?  Comment: Husband passed away in 01-01-99  ?Other Topics Concern  ? Not on file  ?Social History Narrative  ? Widow, does not work, college, exercises.  ? ?Social Determinants of Health  ? ?Financial Resource Strain: Low Risk   ? Difficulty of Paying Living Expenses: Not hard at all  ?Food Insecurity:  No Food Insecurity  ? Worried About Charity fundraiser in the Last Year: Never true  ? Ran Out of Food in the Last Year: Never true  ?Transportation Needs: No Transportation Needs  ? Lack of Transportation (Medical): No  ? Lack of Transportation (Non-Medical): No  ?Physical Activity: Inactive  ? Days of Exercise per Week: 0 days  ? Minutes of Exercise per Session: 0 min  ?Stress: No Stress Concern Present  ? Feeling of Stress : Only a little  ?Social Connections: Moderately Isolated  ? Frequency of Communication with Friends and Family: Twice a week  ? Frequency of Social Gatherings with Friends and Family: Once a week  ? Attends Religious Services: 1 to 4 times per year  ? Active Member of Clubs or Organizations: No  ? Attends Archivist Meetings: Never  ? Marital Status: Widowed  ? ? ?There were no vitals filed for this visit. ? ?There is no height or weight on file to calculate BMI. ? ?Physical Exam ? ?ASSESSMENT AND PLAN: ? ?There are no diagnoses linked to this encounter. ? ?No orders of the defined types were placed in this encounter. ? ? ?No problem-specific Assessment & Plan notes found for this encounter. ? ? ?No follow-ups on file. ? ? ?Phat Dalton [name], MD ?[facility]. ?[place] office. ? ? ?

## 2022-01-16 ENCOUNTER — Ambulatory Visit: Payer: Medicare PPO | Admitting: Family Medicine

## 2022-01-16 DIAGNOSIS — I10 Essential (primary) hypertension: Secondary | ICD-10-CM

## 2022-01-16 DIAGNOSIS — E1169 Type 2 diabetes mellitus with other specified complication: Secondary | ICD-10-CM

## 2022-01-16 NOTE — Progress Notes (Signed)
? ?HPI: ?[name] is a 71 y.o. female, who is here today for follow up. ?She was last seen on 08/17/22. ?Since her last visit she has followed with cardiologist and pain management. ?Hydrocodone-Acetaminophen is being weaned off because skin pruritus. ? ?Her mother passed away since her last visit, she was her caregiver, she was under hospice care.  ?She is living alone. ? ?Hypertension:  ?Medications: Amlodipine 5 mg daily, HCTZ 25 mg daily, and Atenolol 25 mg 1/2 tab daily. ?BP readings at home:120's/60's. ?Side effects:None ?Atrial fib on Eliquis 5 mg bid and Flecainide 100 mg bid. ?Negative for unusual or severe headache, visual changes, exertional chest pain, dyspnea,  focal weakness, or edema. ? ?Lab Results  ?Component Value Date  ? CREATININE 0.99 08/17/2021  ? BUN 21 08/17/2021  ? NA 142 08/17/2021  ? K 3.8 08/17/2021  ? CL 102 08/17/2021  ? CO2 32 08/17/2021  ? ?Diabetes Mellitus II: Dx'ed in 2018. ?- Checking BG at home: Not checking. ?She is on non pharmacologic treatment. She has decreased meal portions, has lost some wt.  ?- eye exam: 06/2021. ?- foot exam: 11/2021. She sees podiatrist regularly. ?- Negative for symptoms of hypoglycemia, polyuria, polydipsia, numbness extremities, foot ulcers/trauma ? ?Lab Results  ?Component Value Date  ? HGBA1C 6.8 (H) 08/17/2021  ? ?Lab Results  ?Component Value Date  ? MICROALBUR <0.7 08/17/2021  ?Because hx of IBS, she has many food restrictions, constipation and diarrhea. ?She follows with GI. ?Fruit and vegetables aggravate diarrhea but she can eat vegetables cook in soups. ?+ Hemorrhoids, intermittent rectal bleed, mainly with diarrhea. ?Takes miralax as needed and Imodium. ? ?She needs refills on Astelin nasal spray, which helps with nasal congestion and rhinorrhea. Symptoms worse during pollen season. ? ?Review of Systems  ?Constitutional:  Positive for fatigue. Negative for activity change, appetite change and fever.  ?HENT:  Negative for  mouth sores, nosebleeds and trouble swallowing.   ?Respiratory:  Negative for cough and wheezing.   ?Gastrointestinal:  Negative for abdominal pain, nausea and vomiting.  ?     Negative for changes in bowel habits.  ?Genitourinary:  Negative for decreased urine volume and hematuria.  ?Musculoskeletal:  Positive for arthralgias and back pain.  ?Skin:  Negative for rash.  ?Neurological:  Negative for syncope, facial asymmetry and weakness.  ?Psychiatric/Behavioral:  Negative for confusion. The patient is nervous/anxious.   ?Rest see pertinent positives and negatives per HPI. ? ?Current Outpatient Medications on File Prior to Visit  ?Medication Sig Dispense Refill  ? amLODipine (NORVASC) 5 MG tablet Take 1 tablet by mouth once daily 90 tablet 3  ? Aspirin-Salicylamide-Caffeine (BC FAST PAIN RELIEF) 650-195-33.3 MG PACK Take 1 tablet by mouth as needed.    ? atenolol (TENORMIN) 25 MG tablet Take 0.5 tablets (12.5 mg total) by mouth daily. May take an extra 1/2 tablet for breakthrough afib 120 tablet 3  ? calcium carbonate (TUMS EX) 750 MG chewable tablet Chew 2 tablets by mouth daily as needed for heartburn.    ? cyclobenzaprine (FLEXERIL) 10 MG tablet Take 2.5 mg by mouth daily as needed (pain level 8 or greater).     ? ELIQUIS 5 MG TABS tablet Take 1 tablet by mouth twice daily 180 tablet 3  ? flecainide (TAMBOCOR) 100 MG tablet Take 1 tablet by mouth twice daily 60 tablet 6  ? fluticasone (FLONASE) 50 MCG/ACT nasal spray Use 2 spray(s) in each nostril once daily 48 g 0  ? hydrochlorothiazide (HYDRODIURIL) 25  MG tablet TAKE 1 TABLET BY MOUTH ONCE DAILY (DUE  FOR  FOLLOW  UP) 90 tablet 2  ? HYDROcodone-acetaminophen (NORCO/VICODIN) 5-325 MG tablet Take 0.5 tablets by mouth daily as needed (pain level 8 or greater). 30 tablet 0  ? hydrocortisone 2.5 % ointment Daily as needed. 60 g 3  ? hydrOXYzine (ATARAX/VISTARIL) 25 MG tablet Take 1 tablet (25 mg total) by mouth 3 (three) times daily as needed for anxiety. 90 tablet  3  ? Multiple Vitamins-Minerals (MULTI-VITAMIN GUMMIES PO) Take by mouth as needed.    ? Olopatadine HCl 0.2 % SOLN Apply to eye daily.    ? omeprazole (PRILOSEC) 40 MG capsule Take 40 mg by mouth in the morning and at bedtime.    ? Polyethylene Glycol 3350 (MIRALAX PO) Take by mouth as needed.    ? potassium chloride (KLOR-CON) 10 MEQ tablet Take 2 tablets by mouth once daily 180 tablet 1  ? pravastatin (PRAVACHOL) 20 MG tablet Take by mouth twice a week 27 tablet 3  ? Tavaborole 5 % SOLN Apply 1 application topically daily. 10 mL 2  ? trolamine salicylate (ASPERCREME) 10 % cream Apply 1 application topically as needed for muscle pain.    ? ?No current facility-administered medications on file prior to visit.  ? ? ?Past Medical History:  ?Diagnosis Date  ? Abdominal pain, epigastric 12/28/2008  ? Centricity Description: ABDOMINAL PAIN, EPIGASTRIC Qualifier: Diagnosis of  By: [name], [name]   Centricity Description: ABDOMINAL PAIN-EPIGASTRIC Qualifier: Diagnosis of  By: [name], [name]   ? Allergic rhinitis 09/16/2013  ? Allergy   ? Anemia   ? Anxiety   ? Anxiety disorder, unspecified 07/06/2017  ? Arthritis   ? Blood in stool   ? BMI 45.0-49.9, adult ([place]) 07/26/2016  ? Class 3 obesity with serious comorbidity and body mass index (BMI) of 45.0 to 49.9 in adult 03/07/2017  ? DDD (degenerative disc disease), lumbar   ? DIVERTICULOSIS-COLON 12/28/2008  ? Qualifier: Diagnosis of  By: [name], [name]   ? GERD 12/28/2008  ? Qualifier: Diagnosis of  By: [name], [name]   ? GERD (gastroesophageal reflux disease)   ? Headache   ? Hyperglycemia 03/17/2014  ? Hypertension   ? Hypokalemia 07/26/2016  ? IBS (irritable bowel syndrome)-C-D 07/26/2016  ? Obesity   ? Other and unspecified hyperlipidemia 03/17/2014  ? PERSONAL HX COLONIC POLYPS 12/28/2008  ? Qualifier: Diagnosis of  By: [name], [name]   ? Reflux esophagitis 12/28/2008  ? Qualifier: Diagnosis of  By: [name], [name]    ? S/P  lumbar spinal fusion 07/02/2012  ? Tubulovillous adenoma of colon 2008  ? ?Allergies  ?Allergen Reactions  ? Crestor [Rosuvastatin]   ? Erythromycin   ?  rash  ? Lansoprazole   ? Penicillins   ?  rash  ? Potassium-Containing Compounds Other (See Comments)  ?  Stomach upset, can take pills, but not the packets ?  ? Robaxin [Methocarbamol]   ?  Messed up stomach  ? Sulfonamide Derivatives   ?  rash  ? Trental [Pentoxifylline Er]   ?  Bad headache  ? ? ?Social History  ? ?Socioeconomic History  ? Marital status: Widowed  ?  Spouse name: Not on file  ? Number of children: Not on file  ? Years of education: Not on file  ? Highest education level: Not on file  ?Occupational History  ? 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 Smoking status: Former  ?  Packs/day: 1.00  ?  Years: 38.00  ?  Pack years: 38.00  ?  Types: Cigarettes  ?  Quit date: 10/28/1997  ?  Years since quitting: 24.2  ? Smokeless tobacco: Never  ? Tobacco comments:  ?  Former smoker 06/06/21  ?Vaping Use  ? Vaping Use: Never used  ?Substance and Sexual Activity  ? Alcohol use: No  ? Drug use: No  ? Sexual activity: Not Currently  ?  Comment: Husband passed away in Dec 26, 1998  ?Other Topics Concern  ? Not on file  ?Social History Narrative  ? Widow, does not work, college, exercises.  ? ?Social Determinants of Health  ? ?Financial Resource Strain: Low Risk   ? Difficulty of Paying Living Expenses: Not hard at all  ?Food Insecurity: No Food Insecurity  ? Worried About Charity fundraiser in the Last Year: Never true  ? Ran Out of Food in the Last Year: Never true  ?Transportation Needs: No Transportation Needs  ? Lack of Transportation (Medical): No  ? Lack of Transportation (Non-Medical): No  ?Physical Activity: Inactive  ? Days of Exercise per Week: 0 days  ? Minutes of Exercise per Session: 0 min  ?Stress: No Stress Concern Present  ? Feeling of Stress : Only a little  ?Social Connections: Moderately Isolated  ? Frequency of Communication with  Friends and Family: Twice a week  ? Frequency of Social Gatherings with Friends and Family: Once a week  ? Attends Religious Services: 1 to 4 times per year  ? Active Member of Clubs or Organizations: No  ? At

## 2022-01-18 ENCOUNTER — Encounter: Payer: Self-pay | Admitting: Family Medicine

## 2022-01-18 ENCOUNTER — Ambulatory Visit: Payer: Medicare PPO | Admitting: Family Medicine

## 2022-01-18 VITALS — BP 120/70 | HR 63 | Resp 16 | Ht 61.0 in | Wt 238.1 lb

## 2022-01-18 DIAGNOSIS — Z9109 Other allergy status, other than to drugs and biological substances: Secondary | ICD-10-CM | POA: Diagnosis not present

## 2022-01-18 DIAGNOSIS — I10 Essential (primary) hypertension: Secondary | ICD-10-CM | POA: Diagnosis not present

## 2022-01-18 DIAGNOSIS — K582 Mixed irritable bowel syndrome: Secondary | ICD-10-CM | POA: Diagnosis not present

## 2022-01-18 DIAGNOSIS — E1169 Type 2 diabetes mellitus with other specified complication: Secondary | ICD-10-CM | POA: Diagnosis not present

## 2022-01-18 LAB — POCT GLYCOSYLATED HEMOGLOBIN (HGB A1C): HbA1c, POC (prediabetic range): 5.9 % (ref 5.7–6.4)

## 2022-01-18 LAB — BASIC METABOLIC PANEL
BUN: 15 mg/dL (ref 6–23)
CO2: 33 mEq/L — ABNORMAL HIGH (ref 19–32)
Calcium: 10.2 mg/dL (ref 8.4–10.5)
Chloride: 102 mEq/L (ref 96–112)
Creatinine, Ser: 1.05 mg/dL (ref 0.40–1.20)
GFR: 54.6 mL/min — ABNORMAL LOW (ref >60.00)
Glucose, Bld: 102 mg/dL — ABNORMAL HIGH (ref 70–99)
Potassium: 4.2 mEq/L (ref 3.5–5.1)
Sodium: 142 mEq/L (ref 135–145)

## 2022-01-18 MED ORDER — AZELASTINE HCL 0.1 % NA SOLN: 2.0000 | Freq: Two times a day (BID) | NASAL | 6 refills | Status: AC

## 2022-01-18 NOTE — Patient Instructions (Addendum)
.A few things to remember from today's visit: ? ?Type 2 diabetes mellitus with other specified complication, without long-term current use of insulin ([place]) - Plan: POC HgB A1c ? ?Essential hypertension ? ?Environmental allergies - Plan: azelastine (ASTELIN) 0.1 % nasal spray ? ?Irritable bowel syndrome with both constipation and diarrhea ? ?If you need refills please call your pharmacy. ?Do not use My Chart to request refills or for acute issues that need immediate attention. ?  ?No changes today. ? ?Please be sure medication list is accurate. ?If a new problem present, please set up appointment sooner than planned today. ? ? ? ?What Is a Low-Residue Diet? ?It limits high-fiber foods, like whole-grain breads and cereals, nuts, seeds, raw or dried fruits, and vegetables. ? ? ? ?Peace of Mind on the [place] ?Being in an unfamiliar place with Crohn's can cause a lot of stress. Keep your cool with these tips. ? ?Share ?Share on Facebook ?Share on Twitter ?"Residue" is undigested food, including fiber, that makes up stool. The goal of the diet is to have fewer, smaller bowel movements each day. That will ease symptoms like diarrhea, bloating, gas, and stomach cramping. ? ?Your doctor may recommend this diet for a little while when you have a flare, or after surgery to help with recovery. ? ?What You Can Eat ?Grains ? ?Refined or enriched white breads and plain crackers, such as saltines or Melba toast (no seeds) ?Cooked cereals, like farina, cream of wheat, and grits ?Cold cereals, like puffed rice and corn flakes ?White rice, noodles, and refined pasta ?Fruits and Vegetables ? ?The skin and seeds of many fruits and vegetables are full of fiber, so you need to peel them and avoid the seeds. ? ?These vegetables are OK: ? ?Well-cooked fresh vegetables or canned vegetables without seeds, like asparagus tips, beets, green beans, carrots, mushrooms, spinach, squash (no seeds), and pumpkin ?Cooked potatoes without  skin ?Tomato sauce (no seeds) ?Fruits on the good list include: ? ?Ripe bananas ?Soft cantaloupe ?Honeydew ?Canned or cooked fruits without seeds or skin, like applesauce or canned pears ?Avocado ?Milk and Dairy ? ?They're OK in moderation. Milk has no fiber, but it may trigger symptoms like diarrhea and cramping if you're lactose intolerant. If you are (meaning you have trouble processing dairy foods), you could take lactase supplements or buy lactose-free products. ? ?Meats ? ?Animal products don't have fiber. You can eat beef, lamb, chicken, fish (no bones), and pork, as long as they're lean, tender, and soft. Eggs are OK, too. ? ?Fats, Sauces, and Condiments ? ?These are all on the diet: ? ?Margarine, butter, and oils ?Mayonnaise and ketchup ?Sour cream ?Smooth sauces and salad dressing ?Soy sauce ?Clear jelly, honey, and syrup ?Sweets and Snacks ? ?You can satisfy your sweet tooth on a low-residue diet. These desserts and snacks are OK to eat in moderation: ? ?Plain cakes and cookies ?Gelatin, plain puddings, custard, and sherbet ?Ice cream and ice pops ?Hard candy ?Pretzels (not whole-grain varieties) ?Vanilla wafers ?Drinks ? ?Safe beverages include: ? ?Decaffeinated coffee, tea, and carbonated beverages (caffeine can upset your stomach) ?Milk ?Juices made without seeds or pulp, like apple, no-pulp orange, and cranberry ?Strained vegetable juices ?  ? ?What You Can?t Eat ?On this plan, you?ll stay away from: ? ?Coconut, seeds, and nuts, including those found in bread, cereal, desserts, and candy ?Whole-grain products, including breads, cereals, crackers, pasta, rice, and kasha ?Raw or dried fruits, like prunes, berries, raisins, figs, and pineapple ?Most  raw vegetables ?Certain cooked vegetables, including peas, broccoli, winter squash, Brussels sprouts, cabbage, corn (and cornbread), onions, cauliflower, potatoes with skin, and baked beans ?Beans, lentils, and tofu ?Tough meats with gristle, and smoked or  cured deli meats ?Cheese with seeds, nuts, or fruit ?Crunchy peanut butter, jam, marmalade, and preserves ?Pickles, olives, relish, sauerkraut, and horseradish ?Popcorn ?Fruit juices with pulp or seeds, prune juice, and pear nectar ?  ? ?How to Make It Work for You ?As long as you follow the general guidelines for the diet, you can mix and match as much as you?d like. There are many meal options to choose from on a low-residue diet: ? ?Breakfast ? ?Decaffeinated coffee with cream and sugar ?Cup of juice, such as no-pulp orange juice, apple juice, or cranberry juice ?Domenick Gong ?Scrambled eggs ?Waffles, [place] toast, or pancakes ?White-bread toast with margarine and grape jelly (no seeds) ?Lunch ? ?Baked chicken, white rice, canned carrots, or green beans ?Salad with baked chicken, American cheese, smooth salad dressing, white dinner roll ?Baked potato (no skin) with sour cream and butter or margarine ?Hamburger with white seedless bun, ketchup, and mayonnaise -- and lettuce if it doesn't make your symptoms worse ?Dinner ? ?Tender roast beef, white rice, cooked carrots or spinach, white dinner roll with margarine or butter ?Pasta with butter or olive oil, French bread, fruit cocktail ?Baked chicken, white rice or baked potato without skin, and cooked green beans ?Broiled fish, white rice, and canned green beans ?Everyone is different. You may be OK with some of the things listed under "foods to avoid," while other items on the "foods to enjoy" list may bother you. So keep a food diary for a few weeks. Track what you eat and how it makes you feel, so you know what works for you. ? ?If you enjoy whole grains, nuts, and raw fruits and vegetables, shifting to a low-residue diet may be hard. But if you prefer white bread and pasta, don't mind canned fruits and vegetables, and are happy to snack on saltines and vanilla wafers, it may come naturally. ? ?Remember, this isn?t a healthy way to eat for a long time because it skips  many important nutrients. ? ?Ask your doctor if they know a nutritionist who can help make sure your diet is right for you and let you know if you need to take supplements. ? ?

## 2022-01-18 NOTE — Assessment & Plan Note (Addendum)
HgA1C at goal. ?Continue non pharmacologic treatment. ?Regular exercise and healthy diet with avoidance of added sugar food intake encouraged. ?Annual eye exam, periodic dental and foot care to continue. ?F/U in 5-6 months ? ?

## 2022-01-21 ENCOUNTER — Encounter: Payer: Self-pay | Admitting: Family Medicine

## 2022-01-31 ENCOUNTER — Ambulatory Visit: Payer: Medicare PPO | Admitting: Podiatry

## 2022-01-31 DIAGNOSIS — L84 Corns and callosities: Secondary | ICD-10-CM

## 2022-01-31 DIAGNOSIS — M79675 Pain in left toe(s): Secondary | ICD-10-CM

## 2022-01-31 DIAGNOSIS — B351 Tinea unguium: Secondary | ICD-10-CM

## 2022-01-31 DIAGNOSIS — M79674 Pain in right toe(s): Secondary | ICD-10-CM | POA: Diagnosis not present

## 2022-01-31 DIAGNOSIS — Z7901 Long term (current) use of anticoagulants: Secondary | ICD-10-CM

## 2022-01-31 MED ORDER — CICLOPIROX 8 % EX SOLN: Freq: Every day | CUTANEOUS | 0 refills | Status: DC

## 2022-02-06 ENCOUNTER — Other Ambulatory Visit: Payer: Self-pay | Admitting: Family Medicine

## 2022-02-06 DIAGNOSIS — E876 Hypokalemia: Secondary | ICD-10-CM

## 2022-02-08 NOTE — Progress Notes (Signed)
Subjective: ?71 year old female presents today for follow-up evaluation of thick, elongated toenails that she not able to trim her self as well as for calluses to her feet.  She will stop the topical medication for the nail fungus that was causing irritation around the nails.  No drainage or pus currently.  No new issues today. ? ?Objective: ?AAO x3, NAD ?DP/PT pulses palpable bilaterally, CRT less than 3 seconds ?Nails can to be hypertrophic, dystrophic, discolored yellow-brown discoloration x10.  Tenderness nails 1-5 bilaterally.  There is no edema, erythema or signs of infection.  Multiple punctate annular hyperkeratotic lesions plantar foot left side worse than the right without any underlying ulceration drainage or signs of infection. ?No pain with calf compression, swelling, warmth, erythema ? ?Assessment: ?Onychomycosis, porokeratosis ? ?Plan: ?-All treatment options discussed with the patient including all alternatives, risks, complications.  ?-Sharply debrided the nails x10 without any complications or bleeding.  I have ordered Penlac for her. ?-Sharply debrided the hyperkeratotic lesions bilaterally x4 without any complications or bleeding. ?-Patient encouraged to call the office with any questions, concerns, change in symptoms.  ? ?Trula Slade DPM ? ?

## 2022-03-20 ENCOUNTER — Encounter: Payer: Self-pay | Admitting: Family Medicine

## 2022-03-20 ENCOUNTER — Ambulatory Visit: Payer: Medicare PPO | Admitting: Family Medicine

## 2022-03-20 VITALS — BP 132/82 | HR 60 | Temp 98.4°F | Resp 16 | Ht 61.0 in | Wt 237.2 lb

## 2022-03-20 DIAGNOSIS — I10 Essential (primary) hypertension: Secondary | ICD-10-CM | POA: Diagnosis not present

## 2022-03-20 DIAGNOSIS — R350 Frequency of micturition: Secondary | ICD-10-CM

## 2022-03-20 DIAGNOSIS — M5416 Radiculopathy, lumbar region: Secondary | ICD-10-CM

## 2022-03-20 DIAGNOSIS — N390 Urinary tract infection, site not specified: Secondary | ICD-10-CM

## 2022-03-20 LAB — POCT URINALYSIS DIPSTICK
Bilirubin, UA: NEGATIVE
Blood, UA: NEGATIVE
Glucose, UA: NEGATIVE
Ketones, UA: NEGATIVE
Nitrite, UA: NEGATIVE
Protein, UA: NEGATIVE
Spec Grav, UA: 1.02 (ref 1.010–1.025)
Urobilinogen, UA: 0.2 E.U./dL
pH, UA: 6 (ref 5.0–8.0)

## 2022-03-20 MED ORDER — PREDNISONE 20 MG PO TABS: ORAL_TABLET | ORAL | 0 refills | Status: AC

## 2022-03-20 MED ORDER — NITROFURANTOIN MONOHYD MACRO 100 MG PO CAPS: 100.0000 mg | ORAL_CAPSULE | Freq: Two times a day (BID) | ORAL | 0 refills | Status: AC

## 2022-03-20 MED ORDER — FLUCONAZOLE 150 MG PO TABS: 150.0000 mg | ORAL_TABLET | Freq: Once | ORAL | 0 refills | Status: AC

## 2022-03-20 NOTE — Progress Notes (Signed)
ACUTE VISIT Chief Complaint  Patient presents with   Dysuria    Patient c/o pain when urinates. Pressure on R lower abdomen. She said it started last wk.    HPI: [name] is a 71 y.o. female with hx of atrial fib on chronic anticoagulation,DM II,chronic pain,anxiety,and HTN here today complaining of lower abdominal pressure like sensation and urinary symptoms for the past week. She has history of IBS constipation and diarrhea, which also causes lower abdominal pain. No changes in bowel habits.  Perineal pressure with urination.  Dysuria  The current episode started in the past 7 days. The problem has been unchanged. The quality of the pain is described as burning. The pain is mild. There has been no fever. She is Not sexually active. There is No history of pyelonephritis. Associated symptoms include frequency and urgency. Pertinent negatives include no chills, discharge, flank pain, hematuria, hesitancy, nausea or vomiting. She has tried nothing for the symptoms.   Lower back pain radiated to LE's, intermittent episodes of numbness for years. S/P lumbar spinal fusion  Pain has been worse, exacerbated by movements and alleviated by rest.  Pain is severe, affects her sleep and ADL's. She is on hydrocodone-acetaminophen 5-325 mg twice daily as needed.  She is also taking BCs, according the patient she was advised to avoid  taking extra acetaminophen. Planning on starting PT tomorrow. Negative for saddle anesthesia or changes in bladder/bowel function. Follows with pain management.  She use to take systemic steroid treatment 6 times per year and it helped tremendously with pain. She is asking for prednisone prescription.  BP was elevated today, she reporting BPs at home 120s to 130s/70 to 80s. She is on HCTZ 25 mg daily and atenolol 25 mg 1/2 tablet daily, and amlodipine 5 mg daily.  Review of Systems  Constitutional:  Negative for chills.  Respiratory:  Negative for  cough, shortness of breath and wheezing.   Cardiovascular:  Positive for leg swelling (stable.). Negative for chest pain and palpitations.  Gastrointestinal:  Positive for abdominal pain. Negative for nausea and vomiting.  Genitourinary:  Positive for dysuria, frequency and urgency. Negative for flank pain, hematuria and hesitancy.  Musculoskeletal:  Positive for arthralgias, back pain and gait problem.  Skin:  Negative for rash.  Neurological:  Negative for syncope.  Psychiatric/Behavioral:  Positive for sleep disturbance. Negative for confusion. The patient is nervous/anxious.   Rest see pertinent positives and negatives per HPI.  Current Outpatient Medications on File Prior to Visit  Medication Sig Dispense Refill   amLODipine (NORVASC) 5 MG tablet Take 1 tablet by mouth once daily 90 tablet 3   Aspirin-Salicylamide-Caffeine (BC FAST PAIN RELIEF) 650-195-33.3 MG PACK Take 1 tablet by mouth as needed.     atenolol (TENORMIN) 25 MG tablet Take 0.5 tablets (12.5 mg total) by mouth daily. May take an extra 1/2 tablet for breakthrough afib 120 tablet 3   azelastine (ASTELIN) 0.1 % nasal spray Place 2 sprays into both nostrils 2 (two) times daily. Use in each nostril as directed 30 mL 6   calcium carbonate (TUMS EX) 750 MG chewable tablet Chew 2 tablets by mouth daily as needed for heartburn.     ciclopirox (PENLAC) 8 % solution Apply topically at bedtime. Apply over nail and surrounding skin. Apply daily over previous coat. After seven (7) days, may remove with alcohol and continue cycle. 6.6 mL 0   cyclobenzaprine (FLEXERIL) 10 MG tablet Take 2.5 mg by mouth daily as needed (pain  level 8 or greater).      ELIQUIS 5 MG TABS tablet Take 1 tablet by mouth twice daily 180 tablet 3   flecainide (TAMBOCOR) 100 MG tablet Take 1 tablet by mouth twice daily 60 tablet 6   fluticasone (FLONASE) 50 MCG/ACT nasal spray Use 2 spray(s) in each nostril once daily 48 g 0   hydrochlorothiazide (HYDRODIURIL) 25 MG  tablet TAKE 1 TABLET BY MOUTH ONCE DAILY (DUE  FOR  FOLLOW  UP) 90 tablet 2   HYDROcodone-acetaminophen (NORCO/VICODIN) 5-325 MG tablet Take 0.5 tablets by mouth daily as needed (pain level 8 or greater). 30 tablet 0   hydrocortisone 2.5 % ointment Daily as needed. 60 g 3   hydrOXYzine (ATARAX/VISTARIL) 25 MG tablet Take 1 tablet (25 mg total) by mouth 3 (three) times daily as needed for anxiety. 90 tablet 3   Multiple Vitamins-Minerals (MULTI-VITAMIN GUMMIES PO) Take by mouth as needed.     Olopatadine HCl 0.2 % SOLN Apply to eye daily.     Polyethylene Glycol 3350 (MIRALAX PO) Take by mouth as needed.     potassium chloride (KLOR-CON) 10 MEQ tablet Take 2 tablets by mouth once daily 180 tablet 1   pravastatin (PRAVACHOL) 20 MG tablet Take by mouth twice a week 27 tablet 3   trolamine salicylate (ASPERCREME) 10 % cream Apply 1 application topically as needed for muscle pain.     omeprazole (PRILOSEC) 40 MG capsule Take 40 mg by mouth in the morning and at bedtime.     No current facility-administered medications on file prior to visit.   Past Medical History:  Diagnosis Date   Abdominal pain, epigastric 12/28/2008   Centricity Description: ABDOMINAL PAIN, EPIGASTRIC Qualifier: Diagnosis of  By: [name], [name]   Centricity Description: ABDOMINAL PAIN-EPIGASTRIC Qualifier: Diagnosis of  By: [name], [name]    Allergic rhinitis 09/16/2013   Allergy    Anemia    Anxiety    Anxiety disorder, unspecified 07/06/2017   Arthritis    Blood in stool    BMI 45.0-49.9, adult ([place]) 07/26/2016   Class 3 obesity with serious comorbidity and body mass index (BMI) of 45.0 to 49.9 in adult 03/07/2017   DDD (degenerative disc disease), lumbar    DIVERTICULOSIS-COLON 12/28/2008   Qualifier: Diagnosis of  By: [name], [name]    GERD 12/28/2008   Qualifier: Diagnosis of  By: [name], [name]    GERD (gastroesophageal reflux disease)    Headache    Hyperglycemia 03/17/2014    Hypertension    Hypokalemia 07/26/2016   IBS (irritable bowel syndrome)-C-D 07/26/2016   Obesity    Other and unspecified hyperlipidemia 03/17/2014   PERSONAL HX COLONIC POLYPS 12/28/2008   Qualifier: Diagnosis of  By: [name], [name]    Reflux esophagitis 12/28/2008   Qualifier: Diagnosis of  By: [name], [name]     S/P lumbar spinal fusion 07/02/2012   Tubulovillous adenoma of colon 2008   Allergies  Allergen Reactions   Crestor [Rosuvastatin]    Erythromycin     rash   Jublia [Efinaconazole] Hives    Redness/burning around nail   Lansoprazole    Penicillins     rash   Potassium-Containing Compounds Other (See Comments)    Stomach upset, can take pills, but not the packets    Robaxin [Methocarbamol]     Messed up stomach   Sulfonamide Derivatives     rash   Tea Tree Oil  Redness, burning (localized)   Trental [Pentoxifylline Er]     Bad headache   Social History   Socioeconomic History   Marital status: Widowed    Spouse name: Not on file   Number of children: Not on file   Years of education: Not on file   Highest education level: Not on file  Occupational History   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   Social Determinants of Health   Financial Resource Strain: Low Risk  (08/10/2021)   Overall Financial Resource Strain (CARDIA)    Difficulty of Paying Living Expenses: Not hard at all  Food Insecurity: No  Food Insecurity (08/10/2021)   Hunger Vital Sign    Worried About Running Out of Food in the Last Year: Never true    Ran Out of Food in the Last Year: Never true  Transportation Needs: No Transportation Needs (08/10/2021)   PRAPARE - Hydrologist (Medical): No    Lack of Transportation (Non-Medical): No  Physical Activity: Inactive (08/10/2021)   Exercise Vital Sign    Days of Exercise per Week: 0 days    Minutes of Exercise per Session: 0 min  Stress: No Stress Concern Present (08/10/2021)   [place]    Feeling of Stress : Only a little  Social Connections: Moderately Isolated (08/10/2021)   Social Connection and Isolation Panel [NHANES]    Frequency of Communication with Friends and Family: Twice a week    Frequency of Social Gatherings with Friends and Family: Once a week    Attends Religious Services: 1 to 4 times per year    Active Member of [employer] or Organizations: No    Attends Archivist Meetings: Never    Marital Status: Widowed   Vitals:   03/20/22 1607 03/20/22 1618  BP: (!) 144/80 132/82  Pulse: 60   Resp: 16   Temp: 98.4 F (36.9 C)   SpO2: 96%    Body mass index is 44.82 kg/m.  Physical Exam Vitals and nursing note reviewed.  Constitutional:      General: She is not in acute distress.    Appearance: She is well-developed.  HENT:     Head: Normocephalic and atraumatic.  Eyes:     Conjunctiva/sclera: Conjunctivae normal.  Cardiovascular:     Rate and Rhythm: Normal rate and regular rhythm.     Heart sounds: No murmur heard. Pulmonary:     Effort: Pulmonary effort is normal. No respiratory distress.     Breath sounds: Normal breath sounds.  Abdominal:     Palpations: Abdomen is soft. There is no mass.     Tenderness: There is no abdominal tenderness.  Musculoskeletal:     Lumbar back: Tenderness present. No bony tenderness.  Lymphadenopathy:  Cervical: No cervical adenopathy.  Skin:    General: Skin is warm.     Findings: No erythema.  Neurological:     Mental Status: She is alert and oriented to person, place, and time.     Comments: Antalgic gait, not assisted.  Psychiatric:        Mood and Affect: Mood is anxious.   ASSESSMENT AND PLAN:  Ms.Katelind was seen today for dysuria.  Diagnoses and all orders for this visit: Orders Placed This Encounter  Procedures   Culture, Urine   POCT urinalysis dipstick   Lumbar radiculopathy Pain is getting worse. Prednisone side effects discussed, including arrhythmias, elevation of BP, and hyperglycemia. In the past prednisone has helped her greatly with pain. Epidural injections have been discussed but at this time she can not arrange transportation, planning on doing so next year. Continue hydrocodone-acetaminophen 5-325 mg twice daily as needed. Continue follow-up with pain management. Instructed about warning signs.  -     predniSONE (DELTASONE) 20 MG tablet; 2 tabs for 5 days, 1 tabs for 4 days, and 1/2 tab for 3 days. Take tables together with breakfast.  Urinary frequency History and urine dipstick analysis suggestive of UTI. Urine dipstick small amount of leuk, rest negative. We discussed other possible causes. Ucx sent.  Urinary tract infection without hematuria, site unspecified Empiric abx treatment with Macrobid 100 mg started today. Treatment will be tailored according to Ucx results and susceptibility report. Instructed about warning signs.  -     nitrofurantoin, macrocrystal-monohydrate, (MACROBID) 100 MG capsule; Take 1 capsule (100 mg total) by mouth 2 (two) times daily for 5 days.  Other orders -     fluconazole (DIFLUCAN) 150 MG tablet; Take 1 tablet (150 mg total) by mouth once for 1 dose. Has had vaginal yeast infection with abx, Diflucan sent in case she needs it.  Essential hypertension Reporting normal BPs at home. Continue HCTZ 25 mg daily,  amlodipine 5 mg daily, and atenolol 25 mg 1/2 tablet daily. Continue monitoring BP regularly. She has appointment with her cardiologist on 05/04/2022.  I spent a total of 38 minutes in both face to face and non face to face activities for this visit on the date of this encounter. During this time history was obtained and documented, examination was performed, and assessment/plan discussed.  Return if symptoms worsen or fail to improve, for Keep next appt.  Umar Patmon G. [name], MD  [facility]. [place] office.

## 2022-03-20 NOTE — Patient Instructions (Addendum)
A few things to remember from today's visit:   Urinary frequency - Plan: POCT urinalysis dipstick  Lumbar radiculopathy - Plan: predniSONE (DELTASONE) 20 MG tablet  Urinary tract infection without hematuria, site unspecified - Plan: nitrofurantoin, macrocrystal-monohydrate, (MACROBID) 100 MG capsule  Essential hypertension  If you need refills please call your pharmacy. Do not use My Chart to request refills or for acute issues that need immediate attention.   Diflucan if yeast like symptoms. Prednisone with food, breakfast. Monitor blood pressure and pulse.  Please be sure medication list is accurate. If a new problem present, please set up appointment sooner than planned today.  Urinary Tract Infection, Adult A urinary tract infection (UTI) is an infection of any part of the urinary tract. The urinary tract includes: The kidneys. The ureters. The bladder. The urethra. These organs make, store, and get rid of pee (urine) in the body. What are the causes? This infection is caused by germs (bacteria) in your genital area. These germs grow and cause swelling (inflammation) of your urinary tract. What increases the risk? The following factors may make you more likely to develop this condition: Using a small, thin tube (catheter) to drain pee. Not being able to control when you pee or poop (incontinence). Being female. If you are female, these things can increase the risk: Using these methods to prevent pregnancy: A medicine that kills sperm (spermicide). A device that blocks sperm (diaphragm). Having low levels of a female hormone (estrogen). Being pregnant. You are more likely to develop this condition if: You have genes that add to your risk. You are sexually active. You take antibiotic medicines. You have trouble peeing because of: A prostate that is bigger than normal, if you are female. A blockage in the part of your body that drains pee from the bladder. A kidney  stone. A nerve condition that affects your bladder. Not getting enough to drink. Not peeing often enough. You have other conditions, such as: Diabetes. A weak disease-fighting system (immune system). Sickle cell disease. Gout. Injury of the spine. What are the signs or symptoms? Symptoms of this condition include: Needing to pee right away. Peeing small amounts often. Pain or burning when peeing. Blood in the pee. Pee that smells bad or not like normal. Trouble peeing. Pee that is cloudy. Fluid coming from the vagina, if you are female. Pain in the belly or lower back. Other symptoms include: Vomiting. Not feeling hungry. Feeling mixed up (confused). This may be the first symptom in older adults. Being tired and grouchy (irritable). A fever. Watery poop (diarrhea). How is this treated? Taking antibiotic medicine. Taking other medicines. Drinking enough water. In some cases, you may need to see a specialist. Follow these instructions at home:  Medicines Take over-the-counter and prescription medicines only as told by your doctor. If you were prescribed an antibiotic medicine, take it as told by your doctor. Do not stop taking it even if you start to feel better. General instructions Make sure you: Pee until your bladder is empty. Do not hold pee for a long time. Empty your bladder after sex. Wipe from front to back after peeing or pooping if you are a female. Use each tissue one time when you wipe. Drink enough fluid to keep your pee pale yellow. Keep all follow-up visits. Contact a doctor if: You do not get better after 1-2 days. Your symptoms go away and then come back. Get help right away if: You have very bad back pain. You have  very bad pain in your lower belly. You have a fever. You have chills. You feeling like you will vomit or you vomit. Summary A urinary tract infection (UTI) is an infection of any part of the urinary tract. This condition is caused  by germs in your genital area. There are many risk factors for a UTI. Treatment includes antibiotic medicines. Drink enough fluid to keep your pee pale yellow. This information is not intended to replace advice given to you by your health care provider. Make sure you discuss any questions you have with your health care provider. Document Revised: 05/07/2020 Document Reviewed: 05/07/2020 Elsevier Patient Education  [place].

## 2022-03-21 LAB — URINE CULTURE
MICRO NUMBER:: 13513197
SPECIMEN QUALITY:: ADEQUATE

## 2022-03-29 NOTE — Progress Notes (Unsigned)
ACUTE VISIT No chief complaint on file.  HPI: Ms.[place] Marializ Ferrebee is a 71 y.o. female, who is here today complaining of *** HPI  Review of Systems Rest see pertinent positives and negatives per HPI.  Current Outpatient Medications on File Prior to Visit  Medication Sig Dispense Refill  . amLODipine (NORVASC) 5 MG tablet Take 1 tablet by mouth once daily 90 tablet 3  . Aspirin-Salicylamide-Caffeine (BC FAST PAIN RELIEF) 650-195-33.3 MG PACK Take 1 tablet by mouth as needed.    Marland Kitchen atenolol (TENORMIN) 25 MG tablet Take 0.5 tablets (12.5 mg total) by mouth daily. May take an extra 1/2 tablet for breakthrough afib 120 tablet 3  . azelastine (ASTELIN) 0.1 % nasal spray Place 2 sprays into both nostrils 2 (two) times daily. Use in each nostril as directed 30 mL 6  . calcium carbonate (TUMS EX) 750 MG chewable tablet Chew 2 tablets by mouth daily as needed for heartburn.    . ciclopirox (PENLAC) 8 % solution Apply topically at bedtime. Apply over nail and surrounding skin. Apply daily over previous coat. After seven (7) days, may remove with alcohol and continue cycle. 6.6 mL 0  . cyclobenzaprine (FLEXERIL) 10 MG tablet Take 2.5 mg by mouth daily as needed (pain level 8 or greater).     Marland Kitchen ELIQUIS 5 MG TABS tablet Take 1 tablet by mouth twice daily 180 tablet 3  . flecainide (TAMBOCOR) 100 MG tablet Take 1 tablet by mouth twice daily 60 tablet 6  . fluticasone (FLONASE) 50 MCG/ACT nasal spray Use 2 spray(s) in each nostril once daily 48 g 0  . hydrochlorothiazide (HYDRODIURIL) 25 MG tablet TAKE 1 TABLET BY MOUTH ONCE DAILY (DUE  FOR  FOLLOW  UP) 90 tablet 2  . HYDROcodone-acetaminophen (NORCO/VICODIN) 5-325 MG tablet Take 0.5 tablets by mouth daily as needed (pain level 8 or greater). 30 tablet 0  . hydrocortisone 2.5 % ointment Daily as needed. 60 g 3  . hydrOXYzine (ATARAX/VISTARIL) 25 MG tablet Take 1 tablet (25 mg total) by mouth 3 (three) times daily as needed for anxiety. 90 tablet 3   . Multiple Vitamins-Minerals (MULTI-VITAMIN GUMMIES PO) Take by mouth as needed.    . Olopatadine HCl 0.2 % SOLN Apply to eye daily.    Marland Kitchen omeprazole (PRILOSEC) 40 MG capsule Take 40 mg by mouth in the morning and at bedtime.    . Polyethylene Glycol 3350 (MIRALAX PO) Take by mouth as needed.    . potassium chloride (KLOR-CON) 10 MEQ tablet Take 2 tablets by mouth once daily 180 tablet 1  . pravastatin (PRAVACHOL) 20 MG tablet Take by mouth twice a week 27 tablet 3  . trolamine salicylate (ASPERCREME) 10 % cream Apply 1 application topically as needed for muscle pain.     No current facility-administered medications on file prior to visit.     Past Medical History:  Diagnosis Date  . Abdominal pain, epigastric 12/28/2008   Centricity Description: ABDOMINAL PAIN, EPIGASTRIC Qualifier: Diagnosis of  By: [name], [name]   Centricity Description: ABDOMINAL PAIN-EPIGASTRIC Qualifier: Diagnosis of  By: [name], [name]   . Allergic rhinitis 09/16/2013  . Allergy   . Anemia   . Anxiety   . Anxiety disorder, unspecified 07/06/2017  . Arthritis   . Blood in stool   . BMI 45.0-49.9, adult ([place]) 07/26/2016  . Class 3 obesity with serious comorbidity and body mass index (BMI) of 45.0 to 49.9 in adult 03/07/2017  . DDD (  degenerative disc disease), lumbar   . DIVERTICULOSIS-COLON 12/28/2008   Qualifier: Diagnosis of  By: [name], [name]   . GERD 12/28/2008   Qualifier: Diagnosis of  By: [name], [name]   . GERD (gastroesophageal reflux disease)   . Headache   . Hyperglycemia 03/17/2014  . Hypertension   . Hypokalemia 07/26/2016  . IBS (irritable bowel syndrome)-C-D 07/26/2016  . Obesity   . Other and unspecified hyperlipidemia 03/17/2014  . PERSONAL HX COLONIC POLYPS 12/28/2008   Qualifier: Diagnosis of  By: [name], [name]   . Reflux esophagitis 12/28/2008   Qualifier: Diagnosis of  By: Julaine Hua CMA (AAMA), [name]    . S/P lumbar spinal fusion 07/02/2012  .  Tubulovillous adenoma of colon 18-Jan-2007   Allergies  Allergen Reactions  . Crestor [Rosuvastatin]   . Erythromycin     rash  . Jublia [Efinaconazole] Hives    Redness/burning around nail  . Lansoprazole   . Penicillins     rash  . Potassium-Containing Compounds Other (See Comments)    Stomach upset, can take pills, but not the packets   . Robaxin [Methocarbamol]     Messed up stomach  . Sulfonamide Derivatives     rash  . Tea Tree Oil     Redness, burning (localized)  . Trental [Pentoxifylline Er]     Bad headache    Social History   Socioeconomic History  . Marital status: Widowed    Spouse name: Not on file  . Number of children: Not on file  . Years of education: Not on file  . Highest education level: Not on file  Occupational History  . 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 Smoking status: Former    Packs/day: 1.00    Years: 38.00    Total pack years: 38.00    Types: Cigarettes    Quit date: 10/28/1997    Years since quitting: 24.4  . Smokeless tobacco: Never  . Tobacco comments:    Former smoker 06/06/21  Vaping Use  . Vaping Use: Never used  Substance and Sexual Activity  . Alcohol use: No  . Drug use: No  . Sexual activity: Not Currently    Comment: Husband passed away in 01-18-99  Other Topics Concern  . 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   Social Determinants of Health   Financial Resource Strain: Low Risk  (08/10/2021)   Overall Financial Resource Strain (CARDIA)   . Difficulty of Paying Living Expenses: Not hard at all  Food Insecurity: No Food Insecurity (08/10/2021)   Hunger Vital Sign   . Worried About Charity fundraiser in the Last Year: Never true   . Ran Out of Food in the Last Year: Never true  Transportation Needs: No Transportation Needs (08/10/2021)   PRAPARE - Transportation   . Lack of Transportation (Medical): No   . Lack of Transportation (Non-Medical): No  Physical  Activity: Inactive (08/10/2021)   Exercise Vital Sign   . Days of Exercise per Week: 0 days   . Minutes of Exercise per Session: 0 min  Stress: No Stress Concern Present (08/10/2021)   [place]   . Feeling of Stress : Only a little  Social Connections: Moderately Isolated (08/10/2021)   Social Connection and Isolation Panel [NHANES]   . Frequency of Communication with Friends and Family: Twice a week   .  Frequency of Social Gatherings with Friends and Family: Once a week   . Attends Religious Services: 1 to 4 times per year   . Active Member of Clubs or Organizations: No   . Attends Archivist Meetings: Never   . Marital Status: Widowed    There were no vitals filed for this visit. There is no height or weight on file to calculate BMI.  Physical Exam  ASSESSMENT AND PLAN:  There are no diagnoses linked to this encounter.   No follow-ups on file.   [name]. [name], MD  [facility]. [place] office.  Discharge Instructions   None

## 2022-03-31 ENCOUNTER — Encounter: Payer: Self-pay | Admitting: Family Medicine

## 2022-03-31 ENCOUNTER — Ambulatory Visit: Payer: Medicare PPO | Admitting: Family Medicine

## 2022-03-31 VITALS — BP 128/70 | HR 71 | Resp 16 | Ht 61.0 in | Wt 241.0 lb

## 2022-03-31 DIAGNOSIS — K582 Mixed irritable bowel syndrome: Secondary | ICD-10-CM

## 2022-03-31 DIAGNOSIS — K625 Hemorrhage of anus and rectum: Secondary | ICD-10-CM | POA: Insufficient documentation

## 2022-03-31 DIAGNOSIS — N949 Unspecified condition associated with female genital organs and menstrual cycle: Secondary | ICD-10-CM

## 2022-04-03 ENCOUNTER — Other Ambulatory Visit: Payer: Self-pay

## 2022-04-03 ENCOUNTER — Emergency Department (HOSPITAL_BASED_OUTPATIENT_CLINIC_OR_DEPARTMENT_OTHER)
Admission: EM | Admit: 2022-04-03 | Discharge: 2022-04-03 | Disposition: A | Discharge status: Home / Self Care | Payer: Medicare PPO | Attending: Emergency Medicine | Admitting: Emergency Medicine

## 2022-04-03 ENCOUNTER — Encounter (HOSPITAL_BASED_OUTPATIENT_CLINIC_OR_DEPARTMENT_OTHER): Payer: Self-pay | Admitting: Emergency Medicine

## 2022-04-03 ENCOUNTER — Emergency Department (HOSPITAL_BASED_OUTPATIENT_CLINIC_OR_DEPARTMENT_OTHER): Payer: Medicare PPO

## 2022-04-03 DIAGNOSIS — M48061 Spinal stenosis, lumbar region without neurogenic claudication: Secondary | ICD-10-CM | POA: Insufficient documentation

## 2022-04-03 DIAGNOSIS — Z7982 Long term (current) use of aspirin: Secondary | ICD-10-CM | POA: Diagnosis not present

## 2022-04-03 DIAGNOSIS — K5730 Diverticulosis of large intestine without perforation or abscess without bleeding: Secondary | ICD-10-CM | POA: Diagnosis not present

## 2022-04-03 DIAGNOSIS — M545 Low back pain, unspecified: Secondary | ICD-10-CM | POA: Diagnosis not present

## 2022-04-03 DIAGNOSIS — K7689 Other specified diseases of liver: Secondary | ICD-10-CM | POA: Diagnosis not present

## 2022-04-03 DIAGNOSIS — Z7901 Long term (current) use of anticoagulants: Secondary | ICD-10-CM | POA: Diagnosis not present

## 2022-04-03 DIAGNOSIS — R1031 Right lower quadrant pain: Secondary | ICD-10-CM | POA: Diagnosis present

## 2022-04-03 DIAGNOSIS — G8929 Other chronic pain: Secondary | ICD-10-CM

## 2022-04-03 DIAGNOSIS — E876 Hypokalemia: Secondary | ICD-10-CM | POA: Diagnosis not present

## 2022-04-03 LAB — CBC WITH DIFFERENTIAL/PLATELET
Abs Immature Granulocytes: 0.14 10*3/uL — ABNORMAL HIGH (ref 0.00–0.07)
Basophils Absolute: 0.1 10*3/uL (ref 0.0–0.1)
Basophils Relative: 1 %
Eosinophils Absolute: 0.3 10*3/uL (ref 0.0–0.5)
Eosinophils Relative: 3 %
HCT: 42.6 % (ref 36.0–46.0)
Hemoglobin: 13.9 g/dL (ref 12.0–15.0)
Immature Granulocytes: 1 %
Lymphocytes Relative: 28 %
Lymphs Abs: 3.6 10*3/uL (ref 0.7–4.0)
MCH: 28.8 pg (ref 26.0–34.0)
MCHC: 32.6 g/dL (ref 30.0–36.0)
MCV: 88.2 fL (ref 80.0–100.0)
Monocytes Absolute: 1.3 10*3/uL — ABNORMAL HIGH (ref 0.1–1.0)
Monocytes Relative: 10 %
Neutro Abs: 7.4 10*3/uL (ref 1.7–7.7)
Neutrophils Relative %: 57 %
Platelets: 257 10*3/uL (ref 150–400)
RBC: 4.83 MIL/uL (ref 3.87–5.11)
RDW: 16.3 % — ABNORMAL HIGH (ref 11.5–15.5)
WBC: 12.8 10*3/uL — ABNORMAL HIGH (ref 4.0–10.5)
nRBC: 0 % (ref 0.0–0.2)

## 2022-04-03 LAB — COMPREHENSIVE METABOLIC PANEL
ALT: 24 U/L (ref 0–44)
AST: 23 U/L (ref 15–41)
Albumin: 4 g/dL (ref 3.5–5.0)
Alkaline Phosphatase: 78 U/L (ref 38–126)
Anion gap: 7 (ref 5–15)
BUN: 21 mg/dL (ref 8–23)
CO2: 34 mmol/L — ABNORMAL HIGH (ref 22–32)
Calcium: 10.1 mg/dL (ref 8.9–10.3)
Chloride: 100 mmol/L (ref 98–111)
Creatinine, Ser: 1.03 mg/dL — ABNORMAL HIGH (ref 0.44–1.00)
GFR, Estimated: 59 mL/min — ABNORMAL LOW (ref >60)
Glucose, Bld: 101 mg/dL — ABNORMAL HIGH (ref 70–99)
Potassium: 4.1 mmol/L (ref 3.5–5.1)
Sodium: 141 mmol/L (ref 135–145)
Total Bilirubin: 0.5 mg/dL (ref 0.3–1.2)
Total Protein: 7.3 g/dL (ref 6.5–8.1)

## 2022-04-03 LAB — URINALYSIS, ROUTINE W REFLEX MICROSCOPIC
Bilirubin Urine: NEGATIVE
Glucose, UA: NEGATIVE mg/dL
Hgb urine dipstick: NEGATIVE
Ketones, ur: NEGATIVE mg/dL
Leukocytes,Ua: NEGATIVE
Nitrite: NEGATIVE
Protein, ur: NEGATIVE mg/dL
Specific Gravity, Urine: 1.015 (ref 1.005–1.030)
pH: 6 (ref 5.0–8.0)

## 2022-04-03 MED ORDER — ONDANSETRON HCL 4 MG/2ML IJ SOLN
4.0000 mg | Freq: Once | INTRAMUSCULAR | Status: DC
  Filled 2022-04-03: qty 2

## 2022-04-03 MED ORDER — HYDROMORPHONE HCL 1 MG/ML IJ SOLN
1.0000 mg | Freq: Once | INTRAMUSCULAR | Status: AC
  Administered 2022-04-03: 1 mg via INTRAVENOUS
  Filled 2022-04-03: qty 1

## 2022-04-03 MED ORDER — IOHEXOL 300 MG/ML  SOLN
100.0000 mL | Freq: Once | INTRAMUSCULAR | Status: AC | PRN
  Administered 2022-04-03: 100 mL via INTRAVENOUS

## 2022-04-03 MED ORDER — HYDROMORPHONE HCL 1 MG/ML IJ SOLN
1.0000 mg | Freq: Once | INTRAMUSCULAR | Status: DC
  Filled 2022-04-03: qty 1

## 2022-04-03 MED ORDER — LIDOCAINE 5 % EX PTCH: 1.0000 | MEDICATED_PATCH | CUTANEOUS | 0 refills | Status: DC

## 2022-04-03 MED ORDER — SODIUM CHLORIDE 0.9 % IV BOLUS
1000.0000 mL | Freq: Once | INTRAVENOUS | Status: AC
  Administered 2022-04-03: 1000 mL via INTRAVENOUS

## 2022-04-04 ENCOUNTER — Ambulatory Visit (INDEPENDENT_AMBULATORY_CARE_PROVIDER_SITE_OTHER): Payer: Medicare PPO | Admitting: Podiatry

## 2022-04-04 DIAGNOSIS — B351 Tinea unguium: Secondary | ICD-10-CM | POA: Diagnosis not present

## 2022-04-04 DIAGNOSIS — Z7901 Long term (current) use of anticoagulants: Secondary | ICD-10-CM | POA: Diagnosis not present

## 2022-04-04 DIAGNOSIS — M79674 Pain in right toe(s): Secondary | ICD-10-CM | POA: Diagnosis not present

## 2022-04-04 DIAGNOSIS — M79675 Pain in left toe(s): Secondary | ICD-10-CM

## 2022-04-04 DIAGNOSIS — L84 Corns and callosities: Secondary | ICD-10-CM | POA: Diagnosis not present

## 2022-04-04 MED ORDER — CICLOPIROX 8 % EX SOLN
Freq: Every day | CUTANEOUS | 0 refills | Status: DC
Start: 2022-04-04 — End: 2022-12-18

## 2022-04-05 LAB — URINE CULTURE

## 2022-04-17 NOTE — Progress Notes (Signed)
Subjective: 71 year old female presents today for follow-up evaluation of thick, elongated toenails that she not able to trim her self as well as for calluses to her feet.  No new concerns today.  She is on Eliquis.  Objective: AAO x3, NAD DP/PT pulses palpable bilaterally, CRT less than 3 seconds Nails can to be hypertrophic, dystrophic, discolored yellow-brown discoloration x10.  Tenderness nails 1-5 bilaterally.  There is no edema, erythema or signs of infection.  Multiple punctate annular hyperkeratotic lesions plantar foot left side worse than the right without any underlying ulceration drainage or signs of infection. No pain with calf compression, swelling, warmth, erythema  Assessment: Onychomycosis, porokeratosis  Plan: -All treatment options discussed with the patient including all alternatives, risks, complications.  -Sharply debrided the nails x10 without any complications or bleeding.  Continue Penlac. -Sharply debrided the hyperkeratotic lesions bilaterally x4 without any complications or bleeding. -Patient encouraged to call the office with any questions, concerns, change in symptoms.   Trula Slade DPM

## 2022-05-04 ENCOUNTER — Encounter (HOSPITAL_COMMUNITY): Payer: Self-pay | Admitting: Physician Assistant

## 2022-05-04 ENCOUNTER — Ambulatory Visit (HOSPITAL_COMMUNITY)
Admission: RE | Admit: 2022-05-04 | Discharge: 2022-05-04 | Disposition: A | Discharge status: Home / Self Care | Payer: Medicare PPO | Source: Ambulatory Visit | Attending: Physician Assistant | Admitting: Physician Assistant

## 2022-05-04 VITALS — BP 132/74 | HR 64 | Ht 61.0 in | Wt 243.6 lb

## 2022-05-04 DIAGNOSIS — I358 Other nonrheumatic aortic valve disorders: Secondary | ICD-10-CM | POA: Insufficient documentation

## 2022-05-04 DIAGNOSIS — E669 Obesity, unspecified: Secondary | ICD-10-CM | POA: Diagnosis not present

## 2022-05-04 DIAGNOSIS — D6869 Other thrombophilia: Secondary | ICD-10-CM | POA: Diagnosis not present

## 2022-05-04 DIAGNOSIS — R400 Somnolence: Secondary | ICD-10-CM | POA: Diagnosis not present

## 2022-05-04 DIAGNOSIS — I484 Atypical atrial flutter: Secondary | ICD-10-CM | POA: Diagnosis not present

## 2022-05-04 DIAGNOSIS — Z79899 Other long term (current) drug therapy: Secondary | ICD-10-CM | POA: Diagnosis not present

## 2022-05-04 DIAGNOSIS — Z6841 Body Mass Index (BMI) 40.0 and over, adult: Secondary | ICD-10-CM | POA: Diagnosis not present

## 2022-05-04 DIAGNOSIS — E785 Hyperlipidemia, unspecified: Secondary | ICD-10-CM | POA: Diagnosis not present

## 2022-05-04 DIAGNOSIS — R0683 Snoring: Secondary | ICD-10-CM | POA: Diagnosis not present

## 2022-05-04 DIAGNOSIS — I480 Paroxysmal atrial fibrillation: Secondary | ICD-10-CM | POA: Diagnosis not present

## 2022-05-04 DIAGNOSIS — I10 Essential (primary) hypertension: Secondary | ICD-10-CM | POA: Diagnosis not present

## 2022-05-04 DIAGNOSIS — Z7901 Long term (current) use of anticoagulants: Secondary | ICD-10-CM | POA: Insufficient documentation

## 2022-05-04 MED ORDER — FLECAINIDE ACETATE 100 MG PO TABS: 100.0000 mg | ORAL_TABLET | Freq: Two times a day (BID) | ORAL | 6 refills | Status: DC

## 2022-05-04 NOTE — Progress Notes (Signed)
Primary Care Physician: [name], [name], MD Primary Cardiologist: Dr Acie Fredrickson Primary Electrophysiologist: none Referring Physician: Dr Doristine Mango [name] is a 71 y.o. female with a history of HTN, HLD, and atrial fibrillation who presents for follow up in the [facility].  The patient was initially diagnosed with atrial fibrillation 04/2020 after presenting to the her PCP office with palpitations. She also wore a 30 day event monitor which showed a 7% afib burden. Patient is on Eliquis for a CHADS2VASC score of 4. Patient reports that recently she has had more episodes of palpitations at rest. She was changed to metoprolol but this did not control her heart rate as well although she was on prednisone at the time. She admits to snoring and daytime somnolence.   On follow up today, patient reports she has done well from an afib standpoint. She did have one episode of afib after getting a steroid injection for bursitis. Otherwise, she has maintained SR. No current bleeding issues on anticoagulation.   Today, she denies symptoms of palpitations, chest pain, orthopnea, PND, dizziness, presyncope, syncope, bleeding, or neurologic sequela. The patient is tolerating medications without difficulties and is otherwise without complaint today.    Atrial Fibrillation Risk Factors:  she does have symptoms or diagnosis of sleep apnea. She declines sleep study. she does not have a history of rheumatic fever. she does not have a history of alcohol use.   she has a BMI of Body mass index is 46.03 kg/m.Marland Kitchen Filed Weights   05/04/22 0904  Weight: 110.5 kg    Family History  Problem Relation Age of Onset   Hypertension Mother    Hyperlipidemia Mother    Hyperlipidemia Father    Hypertension Father    Arthritis Father    Hyperlipidemia Sister    Cervical cancer Sister    Diabetes Sister    Heart disease Sister    Cancer Maternal Grandfather    Hypertension  Paternal Grandmother    Pancreatic cancer Maternal Aunt    Breast cancer Paternal Aunt    Breast cancer Cousin      Atrial Fibrillation Management history:  Previous antiarrhythmic drugs: flecainide  Previous cardioversions: none Previous ablations: none CHADS2VASC score: 4 Anticoagulation history: Eliquis   Past Medical History:  Diagnosis Date   Abdominal pain, epigastric 12/28/2008   Centricity Description: ABDOMINAL PAIN, EPIGASTRIC Qualifier: Diagnosis of  By: Julaine Hua CMA (AAMA), [name]   Centricity Description: ABDOMINAL PAIN-EPIGASTRIC Qualifier: Diagnosis of  By: [name], [name]    Allergic rhinitis 09/16/2013   Allergy    Anemia    Anxiety    Anxiety disorder, unspecified 07/06/2017   Arthritis    Blood in stool    BMI 45.0-49.9, adult ([place]) 07/26/2016   Class 3 obesity with serious comorbidity and body mass index (BMI) of 45.0 to 49.9 in adult 03/07/2017   DDD (degenerative disc disease), lumbar    DIVERTICULOSIS-COLON 12/28/2008   Qualifier: Diagnosis of  By: [name], [name]    GERD 12/28/2008   Qualifier: Diagnosis of  By: [name], [name]    GERD (gastroesophageal reflux disease)    Headache    Hyperglycemia 03/17/2014   Hypertension    Hypokalemia 07/26/2016   IBS (irritable bowel syndrome)-C-D 07/26/2016   Obesity    Other and unspecified hyperlipidemia 03/17/2014   PERSONAL HX COLONIC POLYPS 12/28/2008   Qualifier: Diagnosis of  By: [name], [name]    Reflux esophagitis 12/28/2008  Qualifier: Diagnosis of  By: [name], [name]     S/P lumbar spinal fusion 07/02/2012   Tubulovillous adenoma of colon 2008   Past Surgical History:  Procedure Laterality Date   ABDOMINAL HYSTERECTOMY     COLONOSCOPY  2014   laser surgery to remove scar     not sure of the year; after hyst   RADIOLOGY WITH ANESTHESIA N/A 02/25/2015   Procedure: MRI OF LUMBAR SPINE;  Surgeon: Medication Radiologist, MD;  Location: [facility];  Service: Radiology;   Laterality: N/A;  DR. Bennie Pierini COHEN/MRI   SPINE SURGERY     TONSILLECTOMY AND ADENOIDECTOMY      Current Outpatient Medications  Medication Sig Dispense Refill   amLODipine (NORVASC) 5 MG tablet Take 1 tablet by mouth once daily 90 tablet 3   Aspirin-Salicylamide-Caffeine (BC FAST PAIN RELIEF) 650-195-33.3 MG PACK Take 1 tablet by mouth as needed.     atenolol (TENORMIN) 25 MG tablet Take 0.5 tablets (12.5 mg total) by mouth daily. May take an extra 1/2 tablet for breakthrough afib 120 tablet 3   azelastine (ASTELIN) 0.1 % nasal spray Place 2 sprays into both nostrils 2 (two) times daily. Use in each nostril as directed 30 mL 6   calcium carbonate (TUMS EX) 750 MG chewable tablet Chew 2 tablets by mouth daily as needed for heartburn.     ciclopirox (PENLAC) 8 % solution Apply topically at bedtime. Apply over nail and surrounding skin. Apply daily over previous coat. After seven (7) days, may remove with alcohol and continue cycle. 6.6 mL 0   cyclobenzaprine (FLEXERIL) 10 MG tablet Take 2.5 mg by mouth daily as needed (pain level 8 or greater).      ELIQUIS 5 MG TABS tablet Take 1 tablet by mouth twice daily 180 tablet 3   flecainide (TAMBOCOR) 100 MG tablet Take 1 tablet by mouth twice daily 60 tablet 6   fluticasone (FLONASE) 50 MCG/ACT nasal spray Use 2 spray(s) in each nostril once daily 48 g 0   hydrochlorothiazide (HYDRODIURIL) 25 MG tablet TAKE 1 TABLET BY MOUTH ONCE DAILY (DUE  FOR  FOLLOW  UP) 90 tablet 2   HYDROcodone-acetaminophen (NORCO/VICODIN) 5-325 MG tablet Take 0.5 tablets by mouth daily as needed (pain level 8 or greater). 30 tablet 0   hydrocortisone 2.5 % ointment Daily as needed. 60 g 3   hydrOXYzine (ATARAX/VISTARIL) 25 MG tablet Take 1 tablet (25 mg total) by mouth 3 (three) times daily as needed for anxiety. 90 tablet 3   lidocaine (LIDODERM) 5 % Place 1 patch onto the skin daily. Remove & Discard patch within 12 hours or as directed by MD 30 patch 0   Multiple  Vitamins-Minerals (MULTI-VITAMIN GUMMIES PO) Take by mouth as needed.     Olopatadine HCl 0.2 % SOLN Apply to eye daily.     omeprazole (PRILOSEC) 40 MG capsule Take 40 mg by mouth 2 (two) times daily.     Polyethylene Glycol 3350 (MIRALAX PO) Take by mouth as needed.     potassium chloride (KLOR-CON) 10 MEQ tablet Take 2 tablets by mouth once daily 180 tablet 1   pravastatin (PRAVACHOL) 20 MG tablet Take by mouth twice a week 27 tablet 3   trolamine salicylate (ASPERCREME) 10 % cream Apply 1 application topically as needed for muscle pain.     No current facility-administered medications for this encounter.    Allergies  Allergen Reactions   Crestor [Rosuvastatin]    Erythromycin  rash   Jublia [Efinaconazole] Hives    Redness/burning around nail   Lansoprazole    Penicillins     rash   Potassium-Containing Compounds Other (See Comments)    Stomach upset, can take pills, but not the packets    Robaxin [Methocarbamol]     Messed up stomach   Sulfonamide Derivatives     rash   Tea Tree Oil     Redness, burning (localized)   Trental [Pentoxifylline Er]     Bad headache    Social History   Socioeconomic History   Marital status: Widowed    Spouse name: Not on file   Number of children: Not on file   Years of education: Not on file   Highest education level: Not on file  Occupational History   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    Social Determinants of Health   Financial Resource Strain: Low Risk  (08/10/2021)   Overall Financial Resource Strain (CARDIA)    Difficulty of Paying Living Expenses: Not hard at all  Food Insecurity: No Food Insecurity (08/10/2021)   Hunger Vital Sign    Worried About Running Out of Food in the Last Year: Never true    Ran Out of Food in the Last Year: Never true  Transportation Needs: No Transportation Needs (08/10/2021)   PRAPARE - Hydrologist (Medical): No    Lack of Transportation (Non-Medical): No  Physical Activity: Inactive (08/10/2021)   Exercise Vital Sign    Days of Exercise per Week: 0 days    Minutes of Exercise per Session: 0 min  Stress: No Stress Concern Present (08/10/2021)   [place]    Feeling of Stress : Only a little  Social Connections: Moderately Isolated (08/10/2021)   Social Connection and Isolation Panel [NHANES]    Frequency of Communication with Friends and Family: Twice a week    Frequency of Social Gatherings with Friends and Family: Once a week    Attends Religious Services: 1 to 4 times per year    Active Member of [employer] or Organizations: No    Attends Archivist Meetings: Never    Marital Status: Widowed  Intimate Partner Violence: Not At Risk (08/10/2021)   Humiliation, Afraid, Rape, and Kick questionnaire    Fear of Current or Ex-Partner: No    Emotionally Abused: No    Physically Abused: No    Sexually Abused: No     ROS- All systems are reviewed and negative except as per the HPI above.  Physical Exam: Vitals:   05/04/22 0904  BP: 132/74  Pulse: 64  Weight: 110.5 kg  Height: '5\' 1"'$  (1.549 m)     GEN- The patient is a well appearing obese female, alert and oriented  x 3 today.   HEENT-head normocephalic, atraumatic, sclera clear, conjunctiva pink, hearing intact, trachea midline. Lungs- Clear to ausculation bilaterally, normal  work of breathing Heart- Regular rate and rhythm, no murmurs, rubs or gallops  GI- soft, NT, ND, + BS Extremities- no clubbing, cyanosis, or edema MS- no significant deformity or atrophy Skin- no rash or lesion Psych- euthymic mood, full affect Neuro- strength and sensation are intact   Wt Readings from Last 3 Encounters:  05/04/22 110.5 kg  03/31/22 109.3 kg  03/20/22 107.6 kg    EKG today demonstrates  SR Vent. rate 64 BPM PR interval 188 ms QRS duration 110 ms QT/QTcB 450/464 ms  Echo 05/11/20 demonstrated   1. Left ventricular ejection fraction, by estimation, is 60 to 65%. The left ventricle has normal function. The left ventricle has no regional wall motion abnormalities. Left ventricular diastolic parameters were normal.   2. Right ventricular systolic function is normal. The right ventricular  size is normal. There is normal pulmonary artery systolic pressure.   3. The mitral valve is normal in structure. Trivial mitral valve  regurgitation. No evidence of mitral stenosis.   4. The aortic valve is tricuspid. Aortic valve regurgitation is not  visualized. Mild aortic valve sclerosis is present, with no evidence of aortic valve stenosis.   5. The inferior vena cava is normal in size with greater than 50%  respiratory variability, suggesting right atrial pressure of 3 mmHg.   Epic records are reviewed at length today  CHA2DS2-VASc Score = 4  The patient's score is based upon: CHF History: 0 HTN History: 1 Diabetes History: 1 Stroke History: 0 Vascular Disease History: 0 Age Score: 1 Gender Score: 1        ASSESSMENT AND PLAN: 1. Paroxysmal Atrial Fibrillation/atypical atrial flutter The patient's CHA2DS2-VASc score is 4, indicating a 4.8% annual risk of stroke.   Patient appears to be maintaining SR. Continue flecainide 100 mg BID Continue Eliquis 5 mg BID Continue atenolol 12.5 mg daily with an extra 12.5 mg for heart racing.  2. Secondary Hypercoagulable  State (ICD10:  D68.69) The patient is at significant risk for stroke/thromboembolism based upon her CHA2DS2-VASc Score of 4.  Continue Apixaban (Eliquis).   3. Obesity Body mass index is 46.03 kg/m. Lifestyle modification was discussed and encouraged including regular physical activity and weight reduction.  4. HTN Stable, no changes today.   Follow up in the AF clinic in 6 months.    [facility] [address] [place], [place] [address] [phone] 05/04/2022 9:13 AM

## 2022-05-29 ENCOUNTER — Telehealth: Payer: Self-pay | Admitting: Cardiovascular Disease

## 2022-05-29 NOTE — Telephone Encounter (Signed)
Patient calling about receiving an epidural and still feeling numb in that area. She rcieved it on July 31st. Calling to see what the Dr thinks. Please advise

## 2022-05-30 NOTE — Telephone Encounter (Signed)
Patient states that she saw her orthopedic physician and got an epidural injection for chronic back pain on 05/08/22. Patient states that her BLE were intermittently cold prior to the shot, but since receiving she is experiencing numbness in her buttock that radiates down to toes, and swelling. Denies that swelling has worsened since prior to the epidural, and states that it's only worse if she doesn't wear her compression socks. Spoke with orthopedic physician who per pt, states that they did not know why she was still having the numbness, or why it has now begun to radiate down to toes. She states she is supposed to return to see them in one month. Advised patient that the numbness which is progressing is likely a sign of the severe spinal stenosis that was discovered during last ED visit, and not indicative of cardiac issues. She will touch base with them again to discuss further treatment options.

## 2022-06-17 IMAGING — DX DG KNEE 1-2V*L*
2 series · 2 of 2 positions shown · non-contrast
Comparison: None.

CLINICAL DATA: Fall and left knee pain.

EXAM:
LEFT KNEE - 1-2 VIEW

[knee ap]
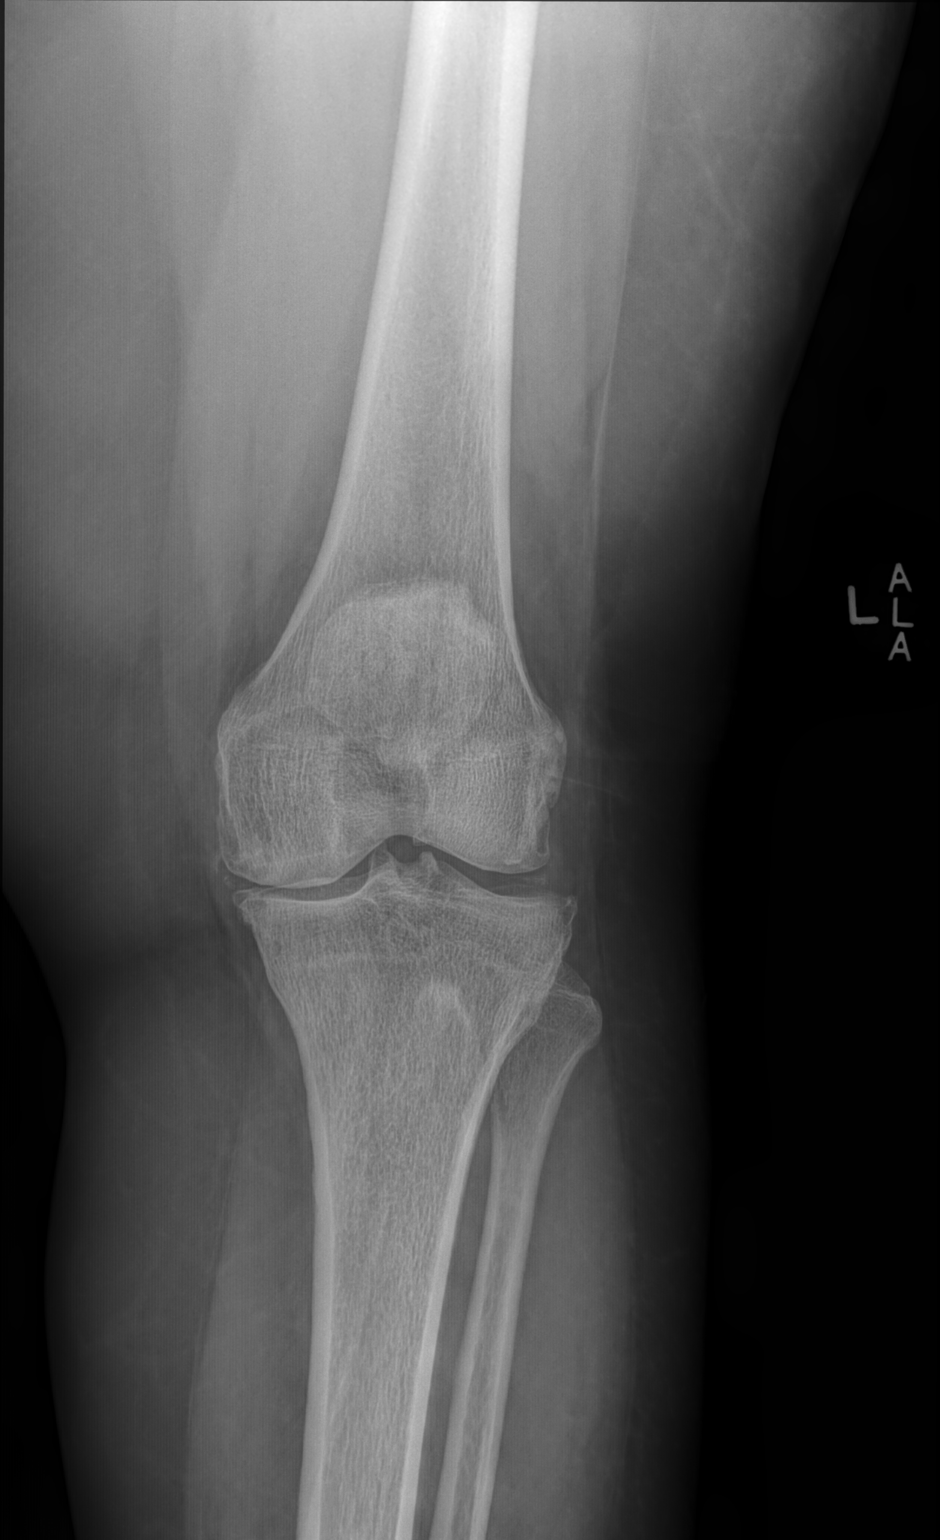

[knee lat]
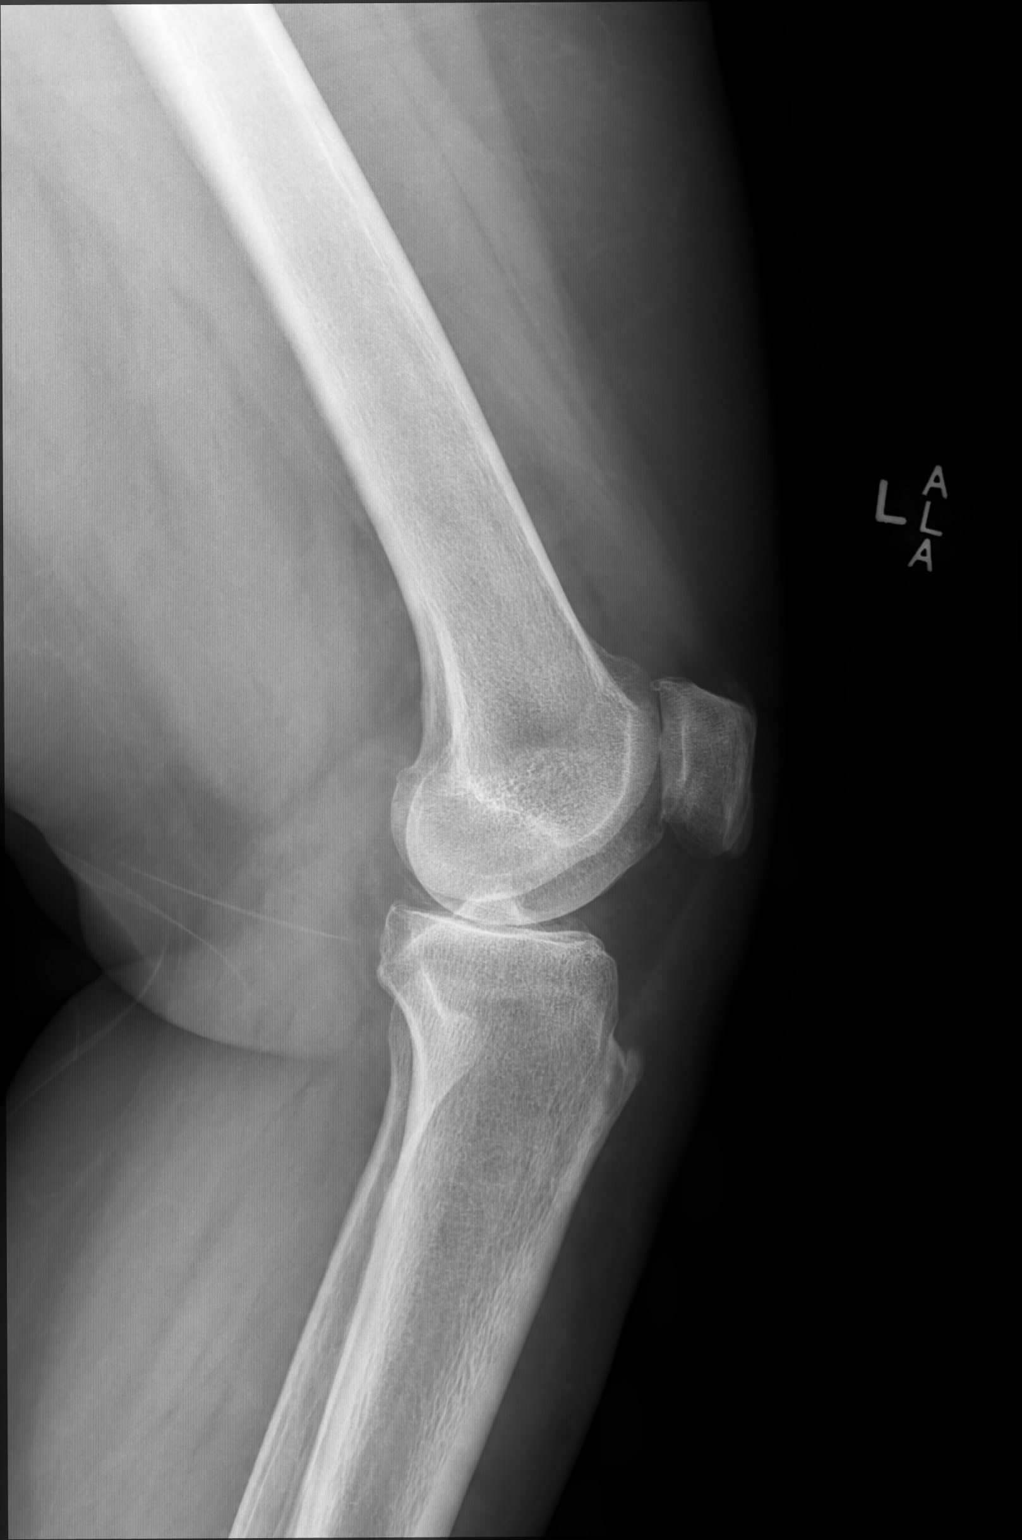

[2 of 2 positions shown; findings below may reference images not displayed]

FINDINGS: There is no acute fracture or dislocation. The bones are osteopenic.
There is arthritic changes with tricompartmental narrowing with
moderate narrowing of the medial compartment. There is a small
suprapatellar effusion. The soft tissues are unremarkable.
IMPRESSION: 1. No acute fracture or dislocation.
2. Osteopenia and moderate osteoarthritis.
3. Small suprapatellar effusion.

## 2022-06-23 ENCOUNTER — Encounter: Payer: Self-pay | Admitting: Obstetrics and Gynecology

## 2022-06-23 ENCOUNTER — Ambulatory Visit: Payer: Medicare PPO | Admitting: Obstetrics and Gynecology

## 2022-06-23 VITALS — BP 128/81 | HR 59 | Ht 60.0 in | Wt 236.0 lb

## 2022-06-23 DIAGNOSIS — M62838 Other muscle spasm: Secondary | ICD-10-CM | POA: Diagnosis not present

## 2022-06-23 DIAGNOSIS — R350 Frequency of micturition: Secondary | ICD-10-CM

## 2022-06-23 DIAGNOSIS — R159 Full incontinence of feces: Secondary | ICD-10-CM

## 2022-06-23 DIAGNOSIS — N393 Stress incontinence (female) (male): Secondary | ICD-10-CM

## 2022-06-23 DIAGNOSIS — N3281 Overactive bladder: Secondary | ICD-10-CM

## 2022-06-23 LAB — POCT URINALYSIS DIPSTICK
Bilirubin, UA: NEGATIVE
Blood, UA: NEGATIVE
Glucose, UA: NEGATIVE
Ketones, UA: NEGATIVE
Leukocytes, UA: NEGATIVE
Nitrite, UA: NEGATIVE
Protein, UA: NEGATIVE
Spec Grav, UA: 1.025 (ref 1.010–1.025)
Urobilinogen, UA: 0.2 E.U./dL
pH, UA: 6 (ref 5.0–8.0)
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
## 2022-07-07 ENCOUNTER — Ambulatory Visit: Payer: Medicare PPO | Admitting: Podiatry

## 2022-07-12 ENCOUNTER — Other Ambulatory Visit: Payer: Self-pay | Admitting: Family Medicine

## 2022-07-12 ENCOUNTER — Telehealth: Payer: Self-pay | Admitting: Family Medicine

## 2022-07-12 DIAGNOSIS — I10 Essential (primary) hypertension: Secondary | ICD-10-CM

## 2022-07-12 DIAGNOSIS — E1169 Type 2 diabetes mellitus with other specified complication: Secondary | ICD-10-CM

## 2022-07-12 MED ORDER — PRAVASTATIN SODIUM 20 MG PO TABS: ORAL_TABLET | ORAL | 3 refills | Status: DC

## 2022-07-12 NOTE — Telephone Encounter (Signed)
Refill pravastatin (PRAVACHOL) 20 MG tablet

## 2022-07-12 NOTE — Telephone Encounter (Signed)
Pt also has questions as to whether she can get a vaccine (covid, flu) 2 weeks prior to her getting an epidural in her back.

## 2022-07-12 NOTE — Therapy (Addendum)
OUTPATIENT PHYSICAL THERAPY FEMALE PELVIC EVALUATION   Patient Name: [name] MRN: [id number] DOB:02-01-1951, 71 y.o., female Today's Date: 07/14/2022   PT End of Session - 07/14/22 1051     Visit Number 1    Date for PT Re-Evaluation 10/06/22    Authorization Type humana    Progress Note Due on Visit 10    PT Start Time 1015    PT Stop Time 1050    PT Time Calculation (min) 35 min    Activity Tolerance Patient tolerated treatment well    Behavior During Therapy [facility] for tasks assessed/performed             Past Medical History:  Diagnosis Date   Abdominal pain, epigastric 12/28/2008   Centricity Description: ABDOMINAL PAIN, EPIGASTRIC Qualifier: Diagnosis of  By: [name], [name]   Centricity Description: ABDOMINAL PAIN-EPIGASTRIC Qualifier: Diagnosis of  By: [name], [name]    Allergic rhinitis 09/16/2013   Allergy    Anemia    Anxiety    Anxiety disorder, unspecified 07/06/2017   Arthritis    Blood in stool    BMI 45.0-49.9, adult ([place]) 07/26/2016   Class 3 obesity with serious comorbidity and body mass index (BMI) of 45.0 to 49.9 in adult 03/07/2017   DDD (degenerative disc disease), lumbar    Diabetes mellitus without complication ([place])    DIVERTICULOSIS-COLON 12/28/2008   Qualifier: Diagnosis of  By: [name], [name]    GERD 12/28/2008   Qualifier: Diagnosis of  By: [name], [name]    GERD (gastroesophageal reflux disease)    Headache    Hyperglycemia 03/17/2014   Hypertension    Hypokalemia 07/26/2016   IBS (irritable bowel syndrome)-C-D 07/26/2016   Obesity    Other and unspecified hyperlipidemia 03/17/2014   PERSONAL HX COLONIC POLYPS 12/28/2008   Qualifier: Diagnosis of  By: [name], [name]    Reflux esophagitis 12/28/2008   Qualifier: Diagnosis of  By: [name], [name]     S/P lumbar spinal fusion 07/02/2012   Tubulovillous adenoma of colon 2008   Past Surgical History:  Procedure  Laterality Date   ABDOMINAL HYSTERECTOMY     COLONOSCOPY  2014   laser surgery to remove scar     not sure of the year; after hyst   RADIOLOGY WITH ANESTHESIA N/A 02/25/2015   Procedure: MRI OF LUMBAR SPINE;  Surgeon: Medication Radiologist, MD;  Location: [facility];  Service: Radiology;  Laterality: N/A;  DR. Bennie Pierini COHEN/MRI   SPINE SURGERY     TONSILLECTOMY AND ADENOIDECTOMY     Patient Active Problem List   Diagnosis Date Noted   Rectal bleed 03/31/2022   Vaginal itching 09/27/2021   Atypical atrial flutter ([place]) 07/05/2021   Secondary hypercoagulable state ([place]) 06/06/2021   Atrial fibrillation ([place]) 04/16/2020   Symptomatic spider varicose vein 05/15/2018   Bradycardia, sinus 09/07/2017   Anxiety disorder, unspecified 07/06/2017   Hyperlipidemia associated with type 2 diabetes mellitus ([place]) 04/26/2017   Morbid obesity ([place]) 03/07/2017   Hypokalemia 07/26/2016   IBS (irritable bowel syndrome)-C-D 07/26/2016   Morbid obesity with BMI of 45.0-49.9, adult ([place]) 07/26/2016   Type 2 diabetes mellitus with other specified complication ([place]) [id number]   Essential hypertension 03/17/2014   Other and unspecified hyperlipidemia 03/17/2014   Hyperglycemia 03/17/2014   Allergic rhinitis 09/16/2013   Arthritis 07/02/2012   S/P lumbar spinal fusion 07/02/2012   REFLUX ESOPHAGITIS 12/28/2008   GERD 12/28/2008   DIVERTICULOSIS-COLON 12/28/2008  Abdominal pain, epigastric 12/28/2008   PERSONAL HX COLONIC POLYPS 12/28/2008    PCP: [name], [name], MD  REFERRING PROVIDER: Jaquita Folds, MD  REFERRING DIAG:  [phone] (ICD-10-CM) - Overactive bladder  N39.3 (ICD-10-CM) - SUI (stress urinary incontinence, female)  R15.9 (ICD-10-CM) - Incontinence of feces, unspecified fecal incontinence type  M62.838 (ICD-10-CM) - Levator spasm    THERAPY DIAG:  Muscle weakness (generalized) - Plan: PT plan of care cert/re-cert  Other lack of coordination - Plan: PT plan of care  cert/re-cert  Incontinence of feces, unspecified fecal incontinence type - Plan: PT plan of care cert/re-cert  Urinary incontinence, unspecified type - Plan: PT plan of care cert/re-cert  Rationale for Evaluation and Treatment Rehabilitation  ONSET DATE: 2018  SUBJECTIVE:                                                                                                                                                                                           SUBJECTIVE STATEMENT: Stool leakage started after she had her colonoscopy. Her urine leakage started when they cut her bladder muscle during her hysterectomy. Patient takes a fluid pill. She uses the bathroom a lot at night. She could leak stool and urine when she does not realize it. Patient has had numbness since her epidural in 04/2022. She gets pressure in the lower abdomen.  Fluid intake: water    PAIN:  Are you having pain? No  PRECAUTIONS: None  WEIGHT BEARING RESTRICTIONS No  FALLS:  Has patient fallen in last 6 months? No  LIVING ENVIRONMENT: Lives with: lives alone  OCCUPATION: retired  PLOF: Independent  PATIENT GOALS reduce the number of pads she uses  PERTINENT HISTORY:  A Fib; DM; chronic anticoagulation; bladder injury with her hysterectomy ;history of bladder instillations with DMSO ; IBS  BOWEL MOVEMENT Pain with bowel movement: No Type of bowel movement:Type (Bristol Stool Scale) Type 4 or Type 6, Frequency 1-4 times per day, Strain Yes, and Splinting yes Fully empty rectum: No Leakage: Yes: daily Pads: 4 pads and 3 depends daily  Fiber supplement: Yes: Miralx  for constipation,  Immodium for diarrhea  URINATION Pain with urination: No Fully empty bladder: No; push on her belly or vagina to empty bladder Stream: Weak Urgency: No Frequency: Day time voids- usually every few hours.  Nocturia: 3 times per night to void Leakage: Urge to void, Walking to the bathroom, Coughing, Sneezing, Laughing,  Lifting, and sit to stand Pads: 4 pads and 3 depends daily   PREGNANCY Vaginal deliveries 1   OBJECTIVE:   DIAGNOSTIC FINDINGS:  Pelvic floor strength I/V, puborectalis I/V external anal sphincter  II/V; PVR of 31 ml was obtained by bladder scan.   Pelvic floor musculature: Right levator tender, Right obturator tender, Left levator tender, Left obturator tender  PATIENT SURVEYS:  PFIQ-7 159 ; UIQ -7 81; CRAIQ-7 81  COGNITION:  Overall cognitive status: Within functional limits for tasks assessed     SENSATION:  Light touch: Appears intact  Proprioception: Appears intact   GAIT: Assistive device utilized: Single point cane Level of assistance: Complete Independence Comments: walks with small steps               POSTURE: No Significant postural limitations, rounded shoulders, forward head, and anterior pelvic tilt   PELVIC ALIGNMENT:correct  LUMBARAROM/PROM  A/PROM A/PROM  eval  Flexion   Extension   Right lateral flexion   Left lateral flexion   Right rotation   Left rotation    (Blank rows = not tested)  LOWER EXTREMITY ROM:  Passive ROM Right eval Left eval  Hip flexion    Hip extension    Hip abduction    Hip adduction    Hip internal rotation    Hip external rotation    Knee flexion    Knee extension    Ankle dorsiflexion    Ankle plantarflexion    Ankle inversion    Ankle eversion     (Blank rows = not tested)  LOWER EXTREMITY MMT:  MMT Right eval Left eval  Hip flexion 90 90  Hip extension    Hip abduction    Hip adduction    Hip internal rotation    Hip external rotation 40 40  Knee flexion    Knee extension    Ankle dorsiflexion    Ankle plantarflexion    Ankle inversion    Ankle eversion      PALPATION:   General  Patient able to tighten her abdominals correctly                External Perineal Exam stool came dripping out without control                             Internal Pelvic Floor No movement of the pelvic floor  muscles, the anus was wide open   Patient confirms identification and approves PT to assess internal pelvic floor and treatment Yes  PELVIC MMT:   MMT eval  Vaginal 0/5  Internal Anal Sphincter 0/5  External Anal Sphincter 0/5  Puborectalis 0/5  (Blank rows = not tested)        TONE: increased  TODAY'S TREATMENT  EVAL was completed   HOME EXERCISE PROGRAM: None today  ASSESSMENT:  CLINICAL IMPRESSION: Patient is a 72 y.o. female who was seen today for physical therapy evaluation and treatment for overactive bladder, stress urinary incontinence,fecal incontinence, and levator spasm. Patient leaks stool since she had her colonoscopy. Her urinary leakage started when she had her hysterectomy and the bladder was injured. Pelvic floor strength was 0/5. Patient anus was very open when therapist placed her finger into the anus. Patient has tightness and tenderness located in bilateral levator ani, obturator internist. Patient had stool pouring out of the anus during the internal muscle exam and she did not feel the stool coming out. Patient wears 4 pads and 3 depends daily. She leaks urine with urge to void, walking to the bathroom, coughing, sneezing, laughing, lifting, and sit to stand. Patient has to push on her belly to urinate. Patient ambulates with  a single point cane. Patient will benefit from skilled therapy to improve pelvic floor strength and coordination to reduce leakage.    OBJECTIVE IMPAIRMENTS decreased activity tolerance, decreased coordination, decreased endurance, decreased strength, increased fascial restrictions, and impaired tone.   ACTIVITY LIMITATIONS carrying, lifting, bending, standing, transfers, continence, and toileting  PARTICIPATION LIMITATIONS: meal prep, cleaning, shopping, and community activity  PERSONAL FACTORS Age, Fitness, Past/current experiences, Time since onset of injury/illness/exacerbation, and 3+ comorbidities: A Fib; DM; chronic  anticoagulation; bladder injury with her hysterectomy ;history of bladder instillations with DMSO ; IBS  are also affecting patient's functional outcome.   REHAB POTENTIAL: Good  CLINICAL DECISION MAKING: Evolving/moderate complexity  EVALUATION COMPLEXITY: Moderate   GOALS: Goals reviewed with patient? Yes  SHORT TERM GOALS: Target date: 08/11/2022  Patient able to contract the pelvic floor to 1/5 due to improve coordination.  Baseline: Goal status: INITIAL  2.  Patient on a timed voiding program every 2 hours to reduce leakage.  Baseline:  Goal status: INITIAL  3.  Patient is able to start feeling stool leakage.  Baseline:  Goal status: INITIAL   LONG TERM GOALS: Target date: 10/06/2022   Patient independent with her HEP for pelvic floor strengthening.  Baseline:  Goal status: INITIAL  2.  Patient is able to reduce her pad usage to 1-2 per day due to increased In pelvic floor strength >/= 3/5.  Baseline:  Goal status: INITIAL  3.  Patient anus is able to hug therapist finger due to improved pelvic floor contraction and reduction of fecal leakage.  Baseline:  Goal status: INITIAL  4.  PFIQ-7 decreased </= 80 from 159 due to improved leakage and reduction of her frustration with the leakage.  Baseline:  Goal status: INITIAL  5.  Patient went from sit to stand with urinary leakage decreased >/= 50%.  Baseline:  Goal status: INITIAL   PLAN: PT FREQUENCY: 1x/week  PT DURATION: 12 weeks  PLANNED INTERVENTIONS: Therapeutic exercises, Therapeutic activity, Neuromuscular re-education, Patient/Family education, Self Care, Biofeedback, and Manual therapy  PLAN FOR NEXT SESSION: work on pelvic floor contraction, manual work into the vaginal and rectum   Earlie Counts, PT 07/14/22 12:34 PM

## 2022-07-14 ENCOUNTER — Ambulatory Visit: Payer: Medicare PPO | Attending: Obstetrics and Gynecology | Admitting: Physical Therapy

## 2022-07-14 ENCOUNTER — Other Ambulatory Visit: Payer: Self-pay

## 2022-07-14 ENCOUNTER — Ambulatory Visit (INDEPENDENT_AMBULATORY_CARE_PROVIDER_SITE_OTHER): Payer: Medicare PPO | Admitting: Podiatry

## 2022-07-14 ENCOUNTER — Encounter: Payer: Self-pay | Admitting: Physical Therapy

## 2022-07-14 DIAGNOSIS — N3281 Overactive bladder: Secondary | ICD-10-CM | POA: Insufficient documentation

## 2022-07-14 DIAGNOSIS — M62838 Other muscle spasm: Secondary | ICD-10-CM | POA: Insufficient documentation

## 2022-07-14 DIAGNOSIS — R278 Other lack of coordination: Secondary | ICD-10-CM | POA: Diagnosis not present

## 2022-07-14 DIAGNOSIS — M79674 Pain in right toe(s): Secondary | ICD-10-CM | POA: Diagnosis not present

## 2022-07-14 DIAGNOSIS — M6281 Muscle weakness (generalized): Secondary | ICD-10-CM | POA: Diagnosis not present

## 2022-07-14 DIAGNOSIS — L84 Corns and callosities: Secondary | ICD-10-CM | POA: Diagnosis not present

## 2022-07-14 DIAGNOSIS — Z7901 Long term (current) use of anticoagulants: Secondary | ICD-10-CM

## 2022-07-14 DIAGNOSIS — M79675 Pain in left toe(s): Secondary | ICD-10-CM

## 2022-07-14 DIAGNOSIS — B351 Tinea unguium: Secondary | ICD-10-CM

## 2022-07-14 DIAGNOSIS — R159 Full incontinence of feces: Secondary | ICD-10-CM | POA: Diagnosis not present

## 2022-07-14 DIAGNOSIS — N393 Stress incontinence (female) (male): Secondary | ICD-10-CM | POA: Insufficient documentation

## 2022-07-14 DIAGNOSIS — R32 Unspecified urinary incontinence: Secondary | ICD-10-CM

## 2022-07-14 NOTE — Progress Notes (Signed)
Subjective: Chief Complaint  Patient presents with   Diabetes    Diabetic foot care, A1c- 5.9 BG- Not taking, Nail trim, Patient had a injection in her back in July and her feet have numbness and cold feeling in both feet since,     71 year old female presents today for follow-up evaluation of thick, elongated toenails that she not able to trim her self as well as for calluses to her feet.  No new concerns today.  She is on Eliquis.  Objective: AAO x3, NAD DP/PT pulses palpable bilaterally, CRT less than 3 seconds Nails can to be hypertrophic, dystrophic, discolored yellow-brown discoloration x10.  Tenderness nails 1-5 bilaterally.  There is no edema, erythema or signs of infection.  Multiple punctate annular hyperkeratotic lesions plantar foot left side worse than the right without any underlying ulceration drainage or signs of infection. No pain with calf compression, swelling, warmth, erythema  Assessment: Onychomycosis, porokeratosis  Plan: -All treatment options discussed with the patient including all alternatives, risks, complications.  -Sharply debrided the nails x10 without any complications or bleeding.  Continue Penlac.  Also discussed adding urea nail gel to help with the thickening. -Sharply debrided the hyperkeratotic lesions bilaterally x4 without any complications or bleeding. -Patient encouraged to call the office with any questions, concerns, change in symptoms.   Trula Slade DPM

## 2022-07-14 NOTE — Telephone Encounter (Signed)
I called and spoke with patient. She is aware of information below.

## 2022-07-14 NOTE — Patient Instructions (Signed)
You can get "urea nail gel" on the toenails to help thin them

## 2022-07-14 NOTE — Telephone Encounter (Signed)
Yes, she can have vaccination 2 weeks before epidural injection; unless provider performing procedure advise otherwise. Thanks, BJ

## 2022-07-17 DIAGNOSIS — E1136 Type 2 diabetes mellitus with diabetic cataract: Secondary | ICD-10-CM | POA: Diagnosis not present

## 2022-07-17 DIAGNOSIS — H5203 Hypermetropia, bilateral: Secondary | ICD-10-CM | POA: Diagnosis not present

## 2022-07-17 DIAGNOSIS — H524 Presbyopia: Secondary | ICD-10-CM | POA: Diagnosis not present

## 2022-07-17 DIAGNOSIS — Z7984 Long term (current) use of oral hypoglycemic drugs: Secondary | ICD-10-CM | POA: Diagnosis not present

## 2022-07-17 DIAGNOSIS — H25813 Combined forms of age-related cataract, bilateral: Secondary | ICD-10-CM | POA: Diagnosis not present

## 2022-07-18 NOTE — Progress Notes (Signed)
HPI: [name] is a 71 y.o. female, who is here today for chronic disease management.  Last seen on 01/18/22. [name] presents for a six-month follow-up appointment, reporting some ongoing issues with urine and bowel incontinence.. She reports having an epidural injection on July 31st, which resulted in numbness in her buttocks/perineal area, and worsening incontinence.  States that she is scheduled for another epidural on October 16th, this time "upper", points to thoracic area. She reports having to wear pads all the time due to an inability to sense when she needs to use the bathroom. She is not able to pass stool when she is on the toilet,wonders if she is impacted. All these problems are affecting her quality of life, she avoids to go out.  Hx of constipation or diarrhea and intermittent rectal bleed.Abdominal bloating sensation and cramps exacerbated by certain foods. Colonoscopy in 10/2018.  Her uro-gynecologist who recommended pelvic floor exercises to help with stool and urine incontinence., scheduled to start therapy.   States that she has had difficulty with blood draws,recently, resulting in bruising.  Atrial fib ,currently taking Eliquis 5 mg bid and  Last follow up with cardiologist in 04/2022. Hypertension on Atenolol 25 mg 1/2 tab daily, Amlodipine 5 mg daily,and HCTZ 25 mg daily. BP readings at home:Not checking. Negative for unusual or severe headache, visual changes, exertional chest pain, dyspnea, focal weakness, or worsening edema.  Lab Results  Component Value Date   CREATININE 1.03 (H) 04/03/2022   BUN 21 04/03/2022   NA 141 04/03/2022   K 4.1 04/03/2022   CL 100 04/03/2022   CO2 34 (H) 04/03/2022   Her diet mostly consists of fast food, but she tries to choose healthier options like fish or chicken. She is aware of the need to be careful with weight gain,has more back pain. She is currently walking with a cane now due to back and LE  pain. Follows with ortho and on chronic opioid treatment.  Hyperlipidemia: Currently on Pravastatin 20 mg daily. She has tolerated medication well. Lab Results  Component Value Date   CHOL 158 08/17/2021   HDL 54.10 08/17/2021   LDLCALC 86 08/17/2021   TRIG 89.0 08/17/2021   CHOLHDL 3 08/17/2021   Diabetes Mellitus II: Dx'ed in 2018.  - Checking BG at home: Not checking. She is not on medication. - eye exam: 07/2022. - foot exam: 11/2021. - Negative for symptoms of hypoglycemia, polyuria, polydipsia, foot ulcers/trauma. LE numbness related to lumbar radiculopathy.  Lab Results  Component Value Date   HGBA1C 5.9 01/18/2022   Lab Results  Component Value Date   MICROALBUR <0.7 08/17/2021   Review of Systems  Constitutional:  Positive for activity change and fatigue. Negative for appetite change and fever.  HENT:  Negative for mouth sores, nosebleeds and sore throat.   Respiratory:  Negative for cough and wheezing.   Gastrointestinal:  Negative for nausea and vomiting.       Negative for changes in bowel habits.  Endocrine: Negative for cold intolerance and heat intolerance.  Genitourinary:  Negative for decreased urine volume, dysuria and hematuria.  Skin:  Negative for rash.  Neurological:  Negative for syncope and facial asymmetry.  Psychiatric/Behavioral:  Negative for confusion. The patient is nervous/anxious.   Rest of ROS see pertinent positives and negatives in HPI.  Current Outpatient Medications on File Prior to Visit  Medication Sig Dispense Refill   amLODipine (NORVASC) 5 MG tablet Take 1 tablet by mouth once daily 90  tablet 3   Aspirin-Salicylamide-Caffeine (BC FAST PAIN RELIEF) 650-195-33.3 MG PACK Take 1 tablet by mouth as needed.     atenolol (TENORMIN) 25 MG tablet Take 0.5 tablets (12.5 mg total) by mouth daily. May take an extra 1/2 tablet for breakthrough afib 120 tablet 3   azelastine (ASTELIN) 0.1 % nasal spray Place 2 sprays into both nostrils 2  (two) times daily. Use in each nostril as directed 30 mL 6   calcium carbonate (TUMS EX) 750 MG chewable tablet Chew 2 tablets by mouth daily as needed for heartburn.     Cetirizine HCl (ZYRTEC ALLERGY PO) Take by mouth.     ciclopirox (PENLAC) 8 % solution Apply topically at bedtime. Apply over nail and surrounding skin. Apply daily over previous coat. After seven (7) days, may remove with alcohol and continue cycle. 6.6 mL 0   cyclobenzaprine (FLEXERIL) 10 MG tablet Take 2.5 mg by mouth daily as needed (pain level 8 or greater).      Docusate Sodium (STOOL SOFTENER LAXATIVE PO) Take by mouth.     ELIQUIS 5 MG TABS tablet Take 1 tablet by mouth twice daily 180 tablet 3   fluticasone (FLONASE) 50 MCG/ACT nasal spray Use 2 spray(s) in each nostril once daily 48 g 0   hydrochlorothiazide (HYDRODIURIL) 25 MG tablet TAKE 1 TABLET BY MOUTH ONCE DAILY . APPOINTMENT REQUIRED FOR FUTURE REFILLS 90 tablet 0   HYDROcodone-acetaminophen (NORCO/VICODIN) 5-325 MG tablet Take 0.5 tablets by mouth daily as needed (pain level 8 or greater). 30 tablet 0   hydrocortisone 2.5 % ointment Daily as needed. 60 g 3   hydrOXYzine (ATARAX/VISTARIL) 25 MG tablet Take 1 tablet (25 mg total) by mouth 3 (three) times daily as needed for anxiety. 90 tablet 3   lidocaine 4 % Place 1 patch onto the skin daily.     Magnesium Hydroxide (DULCOLAX PO) Take by mouth.     Menthol-Zinc Oxide (CALMOSEPTINE) 0.44-20.6 % OINT Apply topically.     miconazole (REMEDY PHYTOPLEX ANTIFUNGAL) 2 % powder Apply topically as needed for itching.     Multiple Vitamins-Minerals (MULTI-VITAMIN GUMMIES PO) Take by mouth as needed.     Olopatadine HCl 0.2 % SOLN Apply to eye daily.     omeprazole (PRILOSEC) 40 MG capsule Take 40 mg by mouth 2 (two) times daily.     Polyethylene Glycol 3350 (MIRALAX PO) Take by mouth as needed.     potassium chloride (KLOR-CON) 10 MEQ tablet Take 2 tablets by mouth once daily 180 tablet 1   pravastatin (PRAVACHOL) 20 MG  tablet Take by mouth twice a week 27 tablet 3   Simethicone (GAS-X PO) Take by mouth.     trolamine salicylate (ASPERCREME) 10 % cream Apply 1 application topically as needed for muscle pain.     No current facility-administered medications on file prior to visit.   Past Medical History:  Diagnosis Date   Abdominal pain, epigastric 12/28/2008   Centricity Description: ABDOMINAL PAIN, EPIGASTRIC Qualifier: Diagnosis of  By: [name], [name]   Centricity Description: ABDOMINAL PAIN-EPIGASTRIC Qualifier: Diagnosis of  By: [name], [name]    Allergic rhinitis 09/16/2013   Allergy    Anemia    Anxiety    Anxiety disorder, unspecified 07/06/2017   Arthritis    Blood in stool    BMI 45.0-49.9, adult ([place]) 07/26/2016   Class 3 obesity with serious comorbidity and body mass index (BMI) of 45.0 to 49.9 in adult 03/07/2017   DDD (  degenerative disc disease), lumbar    Diabetes mellitus without complication ([place])    DIVERTICULOSIS-COLON 12/28/2008   Qualifier: Diagnosis of  By: [name], [name]    GERD 12/28/2008   Qualifier: Diagnosis of  By: [name], [name]    GERD (gastroesophageal reflux disease)    Headache    Hyperglycemia 03/17/2014   Hypertension    Hypokalemia 07/26/2016   IBS (irritable bowel syndrome)-C-D 07/26/2016   Obesity    Other and unspecified hyperlipidemia 03/17/2014   PERSONAL HX COLONIC POLYPS 12/28/2008   Qualifier: Diagnosis of  By: [name], [name]    Reflux esophagitis 12/28/2008   Qualifier: Diagnosis of  By: [name], [name]     S/P lumbar spinal fusion 07/02/2012   Tubulovillous adenoma of colon 01/05/2007   Allergies  Allergen Reactions   Crestor [Rosuvastatin]    Erythromycin     rash   Jublia [Efinaconazole] Hives    Redness/burning around nail   Lansoprazole    Penicillins     rash   Potassium-Containing Compounds Other (See Comments)    Stomach upset, can take pills, but not the packets    Robaxin  [Methocarbamol]     Messed up stomach   Sulfonamide Derivatives     rash   Tea Tree Oil     Redness, burning (localized)   Trental [Pentoxifylline Er]     Bad headache   Social History   Socioeconomic History   Marital status: Widowed    Spouse name: Not on file   Number of children: Not on file   Years of education: Not on file   Highest education level: Not on file  Occupational History   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   Social Determinants of Health   Financial Resource Strain: Low Risk  (08/10/2021)   Overall Financial Resource Strain (CARDIA)    Difficulty of Paying Living Expenses: Not hard at all  Food Insecurity: No Food Insecurity (08/10/2021)   Hunger Vital Sign    Worried About Running Out of Food in the Last Year: Never true    Ran Out of Food in the Last Year: Never true  Transportation Needs: No Transportation Needs (08/10/2021)   PRAPARE - Hydrologist (Medical): No    Lack of Transportation (Non-Medical): No  Physical Activity: Inactive (08/10/2021)   Exercise Vital Sign    Days of Exercise per Week: 0 days    Minutes of Exercise per Session: 0 min  Stress: No Stress Concern Present (08/10/2021)   [place]    Feeling of Stress : Only a little  Social Connections: Moderately Isolated (08/10/2021)   Social Connection and  Isolation Panel [NHANES]    Frequency of Communication with Friends and  Family: Twice a week    Frequency of Social Gatherings with Friends and Family: Once a week    Attends Religious Services: 1 to 4 times per year    Active Member of [employer] or Organizations: No    Attends Archivist Meetings: Never    Marital Status: Widowed   Vitals:   07/19/22 1419  BP: 127/78  Pulse: 60  Resp: 18  Temp: 98.4 F (36.9 C)  SpO2: 97%   Wt Readings from Last 3 Encounters:  07/19/22 244 lb (110.7 kg)  06/23/22 236 lb (107 kg)  05/04/22 243 lb 9.6 oz (110.5 kg)  Body mass index is 47.65 kg/m.  Physical Exam Vitals and nursing note reviewed.  Constitutional:      General: She is not in acute distress.    Appearance: She is well-developed.  HENT:     Head: Normocephalic and atraumatic.     Mouth/Throat:     Mouth: Mucous membranes are moist.     Pharynx: Oropharynx is clear.  Eyes:     Conjunctiva/sclera: Conjunctivae normal.  Cardiovascular:     Rate and Rhythm: Normal rate and regular rhythm.     Pulses:          Posterior tibial pulses are 2+ on the right side and 2+ on the left side.     Heart sounds: No murmur heard. Pulmonary:     Effort: Pulmonary effort is normal. No respiratory distress.     Breath sounds: Normal breath sounds.  Abdominal:     Palpations: Abdomen is soft. There is no hepatomegaly or mass.     Tenderness: There is no abdominal tenderness.  Musculoskeletal:     Right lower leg: 1+ Pitting Edema present.     Left lower leg: 1+ Pitting Edema present.  Lymphadenopathy:     Cervical: No cervical adenopathy.  Skin:    General: Skin is warm.     Findings: No erythema or rash.  Neurological:     General: No focal deficit present.     Mental Status: She is alert and oriented to person, place, and time.     Cranial Nerves: No cranial nerve deficit.     Comments: Antalgic gait assisted with a cane.  Psychiatric:        Mood and Affect: Mood and affect  normal.   ASSESSMENT AND PLAN:  [name] was seen today for follow-up.  Diagnoses and all orders for this visit: Orders Placed This Encounter  Procedures   POC HgB A1c   Lab Results  Component Value Date   HGBA1C 6.0 07/19/2022    Type 2 diabetes mellitus with other specified complication, without long-term current use of insulin ([place]) Problem is well controlled.,  HgA1C is still at goal. Pharmacologic treatment is not needed at this time. Regular exercise and healthy diet with avoidance of added sugar food intake is an important part of treatment and recommended. Annual eye exam, periodic dental and foot care recommended. F/U in 5-6 months  Hyperlipidemia associated with type 2 diabetes mellitus ([place]) Because recently she had difficulty with blood draws, we are holding on blood work. We can check FLP next visit. Continue Pravastatin 20 mg daily. Encouraged to improve diet. Last LDL 86.  Essential hypertension BP adequately controlled. Continue Atenolol 25 mg 1/2 tab daily, Amlodipine 5 mg daily,and HCTZ 25 mg daily. Low salt/DASH diet recommended. Eye exam is current.  Pelvic floor dysfunction in female Planning on starting pelvic floor therapy. Advise the patient  to schedule bathroom breaks every two hours, may help with episodes of incontinence. Bisacodyl sup at night prn to try.  -     bisacodyl (BISACODYL LAXATIVE) 10 MG suppository; Place 1 suppository (10 mg total) rectally daily as needed for moderate constipation.  Irritable bowel syndrome with both constipation and diarrhea Stable over all. Continue avoiding foods that exacerbate symptoms. OTC stool softener daily as needed for constipation.  Lumbar radiculopathy Problem is getting worse, reporting saddle anesthesia. Continue following with ortho and neurosurgeon, planning on another epidural injection.  I spent a total of 46 minutes in both face to face and non face to face activities for this visit on  the date of this encounter. During this time history was obtained and documented, examination was performed, prior labs reviewed, and assessment/plan discussed.  Return in about 6 months (around 01/18/2023).  Drexler Maland G. [name], MD  [facility]. [place] office.

## 2022-07-19 ENCOUNTER — Ambulatory Visit: Payer: Medicare PPO | Admitting: Family Medicine

## 2022-07-19 ENCOUNTER — Encounter: Payer: Self-pay | Admitting: Family Medicine

## 2022-07-19 VITALS — BP 127/78 | HR 60 | Temp 98.4°F | Resp 18 | Ht 60.0 in | Wt 244.0 lb

## 2022-07-19 DIAGNOSIS — I10 Essential (primary) hypertension: Secondary | ICD-10-CM | POA: Diagnosis not present

## 2022-07-19 DIAGNOSIS — E785 Hyperlipidemia, unspecified: Secondary | ICD-10-CM | POA: Diagnosis not present

## 2022-07-19 DIAGNOSIS — K582 Mixed irritable bowel syndrome: Secondary | ICD-10-CM | POA: Diagnosis not present

## 2022-07-19 DIAGNOSIS — E1169 Type 2 diabetes mellitus with other specified complication: Secondary | ICD-10-CM | POA: Diagnosis not present

## 2022-07-19 DIAGNOSIS — M5416 Radiculopathy, lumbar region: Secondary | ICD-10-CM | POA: Diagnosis not present

## 2022-07-19 DIAGNOSIS — M6289 Other specified disorders of muscle: Secondary | ICD-10-CM

## 2022-07-19 LAB — POCT GLYCOSYLATED HEMOGLOBIN (HGB A1C): HbA1c, POC (prediabetic range): 6 % (ref 5.7–6.4)

## 2022-07-19 MED ORDER — ACCU-CHEK AVIVA PLUS W/DEVICE KIT: PACK | 0 refills | Status: DC

## 2022-07-19 MED ORDER — BISACODYL 10 MG RE SUPP: 10.0000 mg | Freq: Every day | RECTAL | 1 refills | Status: DC | PRN

## 2022-07-19 NOTE — Patient Instructions (Addendum)
A few things to remember from today's visit:   Type 2 diabetes mellitus with other specified complication, without long-term current use of insulin ([place]) - Plan: POC HgB A1c  Hyperlipidemia associated with type 2 diabetes mellitus ([place])  Essential hypertension  Pelvic floor dysfunction in female  1. Continue with your scheduled cervical epidural on October 16th. 2. Begin pelvic floor physical therapy for rectum muscle weakness and incontinence issues. 3. Schedule bathroom breaks every two hours to help manage incontinence until you start pelvic floor exercises. 4. Maintain a low salt diet and stay well hydrated to support kidney function. 5. Be mindful of your weight and try to choose healthier food options to avoid worsening your back issues. 6. Get your flu shot and COVID shot at the same time, two weeks after your epidural. 7. Monitor your blood sugar at home using a glucometer.  We will check your cholesterol and kidney function at that time.  If you need refills for medications you take chronically, please call your pharmacy. Do not use My Chart to request refills or for acute issues that need immediate attention. If you send a my chart message, it may take a few days to be addressed, specially if I am not in the office.  Please be sure medication list is accurate. If a new problem present, please set up appointment sooner than planned today.

## 2022-07-21 ENCOUNTER — Other Ambulatory Visit: Payer: Self-pay

## 2022-07-21 MED ORDER — ACCU-CHEK GUIDE ME W/DEVICE KIT: PACK | 0 refills | Status: DC

## 2022-07-21 MED ORDER — ACCU-CHEK GUIDE VI STRP: ORAL_STRIP | 12 refills | Status: DC

## 2022-07-21 MED ORDER — ACCU-CHEK SOFTCLIX LANCETS MISC: 12 refills | Status: AC

## 2022-07-31 ENCOUNTER — Encounter: Payer: Self-pay | Admitting: *Deleted

## 2022-08-02 ENCOUNTER — Ambulatory Visit: Payer: Medicare PPO | Admitting: Physical Therapy

## 2022-08-02 ENCOUNTER — Encounter: Payer: Self-pay | Admitting: Physical Therapy

## 2022-08-02 DIAGNOSIS — R278 Other lack of coordination: Secondary | ICD-10-CM

## 2022-08-02 DIAGNOSIS — R32 Unspecified urinary incontinence: Secondary | ICD-10-CM

## 2022-08-02 DIAGNOSIS — M6281 Muscle weakness (generalized): Secondary | ICD-10-CM

## 2022-08-02 DIAGNOSIS — M62838 Other muscle spasm: Secondary | ICD-10-CM | POA: Diagnosis not present

## 2022-08-02 DIAGNOSIS — N393 Stress incontinence (female) (male): Secondary | ICD-10-CM | POA: Diagnosis not present

## 2022-08-02 DIAGNOSIS — R159 Full incontinence of feces: Secondary | ICD-10-CM | POA: Diagnosis not present

## 2022-08-02 DIAGNOSIS — N3281 Overactive bladder: Secondary | ICD-10-CM | POA: Diagnosis not present

## 2022-08-02 NOTE — Therapy (Signed)
OUTPATIENT PHYSICAL THERAPY TREATMENT NOTE   Patient Name: [name] MRN: [id number] DOB:12/23/1950, 71 y.o., female Today's Date: 08/02/2022  PCP: [name], [name], MD REFERRING PROVIDER: Jaquita Folds, MD  END OF SESSION:   PT End of Session - 08/02/22 1450     Visit Number 2    Date for PT Re-Evaluation 10/06/22    Authorization Type humana    Authorization Time Period 07/14/2022-10/06/2022    Authorization - Visit Number 2    Authorization - Number of Visits 12    Progress Note Due on Visit 10    PT Start Time 6144    PT Stop Time 1525    PT Time Calculation (min) 40 min    Activity Tolerance Patient tolerated treatment well    Behavior During Therapy [facility] for tasks assessed/performed             Past Medical History:  Diagnosis Date   Abdominal pain, epigastric 12/28/2008   Centricity Description: ABDOMINAL PAIN, EPIGASTRIC Qualifier: Diagnosis of  By: [name], [name]   Centricity Description: ABDOMINAL PAIN-EPIGASTRIC Qualifier: Diagnosis of  By: [name], [name]    Allergic rhinitis 09/16/2013   Allergy    Anemia    Anxiety    Anxiety disorder, unspecified 07/06/2017   Arthritis    Blood in stool    BMI 45.0-49.9, adult ([place]) 07/26/2016   Class 3 obesity with serious comorbidity and body mass index (BMI) of 45.0 to 49.9 in adult 03/07/2017   DDD (degenerative disc disease), lumbar    Diabetes mellitus without complication ([place])    DIVERTICULOSIS-COLON 12/28/2008   Qualifier: Diagnosis of  By: [name], [name]    GERD 12/28/2008   Qualifier: Diagnosis of  By: [name], [name]    GERD (gastroesophageal reflux disease)    Headache    Hyperglycemia 03/17/2014   Hypertension    Hypokalemia 07/26/2016   IBS (irritable bowel syndrome)-C-D 07/26/2016   Obesity    Other and unspecified hyperlipidemia 03/17/2014   PERSONAL HX COLONIC POLYPS 12/28/2008   Qualifier: Diagnosis of  By: [name], [name]    Reflux  esophagitis 12/28/2008   Qualifier: Diagnosis of  By: Julaine Hua CMA (AAMA), [name]     S/P lumbar spinal fusion 07/02/2012   Tubulovillous adenoma of colon 2008   Past Surgical History:  Procedure Laterality Date   ABDOMINAL HYSTERECTOMY     COLONOSCOPY  2014   laser surgery to remove scar     not sure of the year; after hyst   RADIOLOGY WITH ANESTHESIA N/A 02/25/2015   Procedure: MRI OF LUMBAR SPINE;  Surgeon: Medication Radiologist, MD;  Location: [place];  Service: Radiology;  Laterality: N/A;  DR. Bennie Pierini COHEN/MRI   SPINE SURGERY     TONSILLECTOMY AND ADENOIDECTOMY     Patient Active Problem List   Diagnosis Date Noted   Rectal bleed 03/31/2022   Vaginal itching 09/27/2021   Atypical atrial flutter ([place]) 07/05/2021   Secondary hypercoagulable state ([place]) 06/06/2021   Atrial fibrillation ([place]) 04/16/2020   Symptomatic spider varicose vein 05/15/2018   Bradycardia, sinus 09/07/2017   Anxiety disorder, unspecified 07/06/2017   Hyperlipidemia associated with type 2 diabetes mellitus ([place]) 04/26/2017   Morbid obesity ([place]) 03/07/2017   Hypokalemia 07/26/2016   IBS (irritable bowel syndrome)-C-D 07/26/2016   Morbid obesity with BMI of 45.0-49.9, adult ([place]) 07/26/2016   Type 2 diabetes mellitus with other specified complication ([place]) 31/54/0086   Essential hypertension 03/17/2014  Other and unspecified hyperlipidemia 03/17/2014   Hyperglycemia 03/17/2014   Allergic rhinitis 09/16/2013   Arthritis 07/02/2012   S/P lumbar spinal fusion 07/02/2012   REFLUX ESOPHAGITIS 12/28/2008   GERD 12/28/2008   DIVERTICULOSIS-COLON 12/28/2008   Abdominal pain, epigastric 12/28/2008   PERSONAL HX COLONIC POLYPS 12/28/2008   REFERRING DIAG:  N32.81 (ICD-10-CM) - Overactive bladder  N39.3 (ICD-10-CM) - SUI (stress urinary incontinence, female)  R15.9 (ICD-10-CM) - Incontinence of feces, unspecified fecal incontinence type  M62.838 (ICD-10-CM) - Levator spasm      THERAPY DIAG:  Muscle  weakness (generalized) - Plan: PT plan of care cert/re-cert   Other lack of coordination - Plan: PT plan of care cert/re-cert   Incontinence of feces, unspecified fecal incontinence type - Plan: PT plan of care cert/re-cert   Urinary incontinence, unspecified type - Plan: PT plan of care cert/re-cert   Rationale for Evaluation and Treatment Rehabilitation   ONSET DATE: 2018   SUBJECTIVE:                                                                                                                                                                                            SUBJECTIVE STATEMENT: I have had an injection in my back on 07/25/2022. I am numb since I had the shot so I can not tell that I am contracting the muscles.     PAIN:  Are you having pain? No   PRECAUTIONS: None   WEIGHT BEARING RESTRICTIONS No   FALLS:  Has patient fallen in last 6 months? No   LIVING ENVIRONMENT: Lives with: lives alone   OCCUPATION: retired   PLOF: Independent   PATIENT GOALS reduce the number of pads she uses   PERTINENT HISTORY:  A Fib; DM; chronic anticoagulation; bladder injury with her hysterectomy ;history of bladder instillations with DMSO ; IBS   BOWEL MOVEMENT Pain with bowel movement: No Type of bowel movement:Type (Bristol Stool Scale) Type 4 or Type 6, Frequency 1-4 times per day, Strain Yes, and Splinting yes Fully empty rectum: No Leakage: Yes: daily Pads: 4 pads and 3 depends daily   Fiber supplement: Yes: Miralx  for constipation,  Immodium for diarrhea   URINATION Pain with urination: No Fully empty bladder: No; push on her belly or vagina to empty bladder Stream: Weak Urgency: No Frequency: Day time voids- usually every few hours.  Nocturia: 3 times per night to void Leakage: Urge to void, Walking to the bathroom, Coughing, Sneezing, Laughing, Lifting, and sit to stand Pads: 4 pads and 3 depends daily     PREGNANCY Vaginal deliveries 1     OBJECTIVE:  DIAGNOSTIC FINDINGS:  Pelvic floor strength I/V, puborectalis I/V external anal sphincter II/V; PVR of 31 ml was obtained by bladder scan.   Pelvic floor musculature: Right levator tender, Right obturator tender, Left levator tender, Left obturator tender   PATIENT SURVEYS:  PFIQ-7 159 ; UIQ -7 81; CRAIQ-7 81   COGNITION:            Overall cognitive status: Within functional limits for tasks assessed                          SENSATION:            Light touch: Appears intact            Proprioception: Appears intact     GAIT: Assistive device utilized: Single point cane Level of assistance: Complete Independence Comments: walks with small steps                POSTURE: No Significant postural limitations, rounded shoulders, forward head, and anterior pelvic tilt               PELVIC ALIGNMENT:correct   LUMBARAROM/PROM   A/PROM A/PROM  eval  Flexion    Extension    Right lateral flexion    Left lateral flexion    Right rotation    Left rotation     (Blank rows = not tested)   LOWER EXTREMITY ROM:   Passive ROM Right eval Left eval  Hip flexion      Hip extension      Hip abduction      Hip adduction      Hip internal rotation      Hip external rotation      Knee flexion      Knee extension      Ankle dorsiflexion      Ankle plantarflexion      Ankle inversion      Ankle eversion       (Blank rows = not tested)   LOWER EXTREMITY MMT:   MMT Right eval Left eval  Hip flexion 90 90  Hip extension      Hip abduction      Hip adduction      Hip internal rotation      Hip external rotation 40 40  Knee flexion      Knee extension      Ankle dorsiflexion      Ankle plantarflexion      Ankle inversion      Ankle eversion         PALPATION:   General  Patient able to tighten her abdominals correctly                 External Perineal Exam stool came dripping out without control                             Internal Pelvic Floor No movement of the  pelvic floor muscles, the anus was wide open    Patient confirms identification and approves PT to assess internal pelvic floor and treatment Yes   PELVIC MMT:   MMT eval  Vaginal 0/5  Internal Anal Sphincter 0/5  External Anal Sphincter 0/5  Puborectalis 0/5  (Blank rows = not tested)         TONE: increased   TODAY'S TREATMENT  08/02/2022 Neuromuscular re-education: Pelvic floor contraction training:right sidely with pillow between knees  squeezing it with pelvic floor contraction holding 5 sec 10x 2    Sidely press hands on thighs holding for 5 sec to engage the lower abdominal with pelvic floor 10 x 2    Sitting ball sqeeze hold 5 sec 10x 2    Sitting hip abduction with yellow band 10x2 Exercises: Strengthening: nustep level 3 for 5 mintues    PATIENT EDUCATION: 08/02/2022 Education details: Access Code: [email or web address] Person educated: Patient Education method: Explanation, Demonstration, Tactile cues, Verbal cues, and Handouts Education comprehension: verbalized understanding, returned demonstration, verbal cues required, tactile cues required, and needs further education    HOME EXERCISE PROGRAM: 08/02/2022 Access Code: [email or web address] URL: [email or web address] Date: 08/02/2022 Prepared by: Earlie Counts  Exercises - Supine Hip Adduction Isometric with Ball  - 1 x daily - 7 x weekly - 2 sets - 10 reps - Supine Transversus Abdominis Bracing - Hands on Thighs  - 1 x daily - 7 x weekly - 2 sets - 10 reps - 5 sec hold - Seated Hip Adduction Squeeze with Ball  - 1 x daily - 7 x weekly - 2 sets - 10 reps - 5 sec hold - Seated Hip Abduction with Resistance  - 1 x daily - 7 x weekly - 2 sets - 10 reps None today   ASSESSMENT:   CLINICAL IMPRESSION: Patient is a 71 y.o. female who was seen today for physical therapy  treatment for overactive bladder, stress urinary incontinence,fecal incontinence, and levator spasm. Patient reports she has been numb in the pelvic area  since she had her injections on 07/25/2022. Patient cannot feel that she is contracting the pelvic floor. Patient exercises are having overflow of muscles to assist in pelvic floor strengtheningDuring treatment there was a smell of stool. . Patient will benefit from skilled therapy to improve pelvic floor strength and coordination to reduce leakage.      OBJECTIVE IMPAIRMENTS decreased activity tolerance, decreased coordination, decreased endurance, decreased strength, increased fascial restrictions, and impaired tone.    ACTIVITY LIMITATIONS carrying, lifting, bending, standing, transfers, continence, and toileting   PARTICIPATION LIMITATIONS: meal prep, cleaning, shopping, and community activity   PERSONAL FACTORS Age, Fitness, Past/current experiences, Time since onset of injury/illness/exacerbation, and 3+ comorbidities: A Fib; DM; chronic anticoagulation; bladder injury with her hysterectomy ;history of bladder instillations with DMSO ; IBS  are also affecting patient's functional outcome.    REHAB POTENTIAL: Good   CLINICAL DECISION MAKING: Evolving/moderate complexity   EVALUATION COMPLEXITY: Moderate     GOALS: Goals reviewed with patient? Yes   SHORT TERM GOALS: Target date: 08/11/2022   Patient able to contract the pelvic floor to 1/5 due to improve coordination.  Baseline: Goal status: INITIAL   2.  Patient on a timed voiding program every 2 hours to reduce leakage.  Baseline:  Goal status: INITIAL   3.  Patient is able to start feeling stool leakage.  Baseline:  Goal status: INITIAL     LONG TERM GOALS: Target date: 10/06/2022    Patient independent with her HEP for pelvic floor strengthening.  Baseline:  Goal status: INITIAL   2.  Patient is able to reduce her pad usage to 1-2 per day due to increased In pelvic floor strength >/= 3/5.  Baseline:  Goal status: INITIAL   3.  Patient anus is able to hug therapist finger due to improved pelvic floor contraction  and reduction of fecal leakage.  Baseline:  Goal status: INITIAL   4.  PFIQ-7 decreased </= 80 from  159 due to improved leakage and reduction of her frustration with the leakage.  Baseline:  Goal status: INITIAL   5.  Patient went from sit to stand with urinary leakage decreased >/= 50%.  Baseline:  Goal status: INITIAL     PLAN: PT FREQUENCY: 1x/week   PT DURATION: 12 weeks   PLANNED INTERVENTIONS: Therapeutic exercises, Therapeutic activity, Neuromuscular re-education, Patient/Family education, Self Care, Biofeedback, and Manual therapy   PLAN FOR NEXT SESSION: work on pelvic floor contraction, manual work into the vaginal and rectum, timed voiding, see what the doctor says. Standing exercises sit to stand  Earlie Counts, PT 08/02/22 3:33 PM

## 2022-08-08 ENCOUNTER — Other Ambulatory Visit: Payer: Self-pay | Admitting: Family Medicine

## 2022-08-08 DIAGNOSIS — E876 Hypokalemia: Secondary | ICD-10-CM

## 2022-08-11 ENCOUNTER — Ambulatory Visit (INDEPENDENT_AMBULATORY_CARE_PROVIDER_SITE_OTHER): Payer: Medicare PPO

## 2022-08-11 VITALS — BP 122/62 | HR 67 | Temp 98.1°F | Ht 61.5 in | Wt 244.3 lb

## 2022-08-11 DIAGNOSIS — Z0000 Encounter for general adult medical examination without abnormal findings: Secondary | ICD-10-CM

## 2022-08-11 NOTE — Progress Notes (Addendum)
Subjective:   [name] is a 71 y.o. female who presents for Medicare Annual (Subsequent) preventive examination.  Review of Systems      Cardiac Risk Factors include: advanced age (>55mn, >>24women);hypertension;diabetes mellitus     Objective:    Today's Vitals   08/11/22 1434  BP: 122/62  Pulse: 67  Temp: 98.1 F (36.7 C)  TempSrc: Oral  SpO2: 96%  Weight: 244 lb 4.8 oz (110.8 kg)  Height: 5' 1.5" (1.562 m)   Body mass index is 45.41 kg/m.     08/11/2022    3:02 PM 07/14/2022   10:22 AM 07/20/2020    8:28 AM 02/01/2018    8:47 AM 04/27/2017    7:35 AM 02/22/2015    9:29 AM  Advanced Directives  Does Patient Have a Medical Advance Directive? _0  No  Would patient like information on creating a medical advance directive? No - Patient declined No - Patient declined No - Patient declined   No - patient declined information    Current Medications (verified) Outpatient Encounter Medications as of 08/11/2022  Medication Sig   Accu-Chek Softclix Lancets lancets Use to check blood sugars 1-2 times daily.   amLODipine (NORVASC) 5 MG tablet Take 1 tablet by mouth once daily   Aspirin-Salicylamide-Caffeine (BC FAST PAIN RELIEF) 650-195-33.3 MG PACK Take 1 tablet by mouth as needed.   atenolol (TENORMIN) 25 MG tablet Take 0.5 tablets (12.5 mg total) by mouth daily. May take an extra 1/2 tablet for breakthrough afib   azelastine (ASTELIN) 0.1 % nasal spray Place 2 sprays into both nostrils 2 (two) times daily. Use in each nostril as directed   bisacodyl (BISACODYL LAXATIVE) 10 MG suppository Place 1 suppository (10 mg total) rectally daily as needed for moderate constipation.   Blood Glucose Monitoring Suppl (ACCU-CHEK GUIDE ME) w/Device KIT Use to test blood sugars 1-2 times daily.   calcium carbonate (TUMS EX) 750 MG chewable tablet Chew 2 tablets by mouth daily as needed for heartburn.   Cetirizine HCl (ZYRTEC ALLERGY PO) Take by mouth.   ciclopirox  (PENLAC) 8 % solution Apply topically at bedtime. Apply over nail and surrounding skin. Apply daily over previous coat. After seven (7) days, may remove with alcohol and continue cycle.   cyclobenzaprine (FLEXERIL) 10 MG tablet Take 2.5 mg by mouth daily as needed (pain level 8 or greater).    Docusate Sodium (STOOL SOFTENER LAXATIVE PO) Take by mouth.   ELIQUIS 5 MG TABS tablet Take 1 tablet by mouth twice daily   fluticasone (FLONASE) 50 MCG/ACT nasal spray Use 2 spray(s) in each nostril once daily   glucose blood (ACCU-CHEK GUIDE) test strip Use to test blood sugars 1-2 times daily.   hydrochlorothiazide (HYDRODIURIL) 25 MG tablet TAKE 1 TABLET BY MOUTH ONCE DAILY . APPOINTMENT REQUIRED FOR FUTURE REFILLS   HYDROcodone-acetaminophen (NORCO/VICODIN) 5-325 MG tablet Take 0.5 tablets by mouth daily as needed (pain level 8 or greater).   hydrocortisone 2.5 % ointment Daily as needed.   hydrOXYzine (ATARAX/VISTARIL) 25 MG tablet Take 1 tablet (25 mg total) by mouth 3 (three) times daily as needed for anxiety.   Magnesium Hydroxide (DULCOLAX PO) Take by mouth.   Menthol-Zinc Oxide (CALMOSEPTINE) 0.44-20.6 % OINT Apply topically.   miconazole (REMEDY PHYTOPLEX ANTIFUNGAL) 2 % powder Apply topically as needed for itching.   Multiple Vitamins-Minerals (MULTI-VITAMIN GUMMIES PO) Take by mouth as needed.   Olopatadine HCl 0.2 % SOLN Apply to eye daily.  omeprazole (PRILOSEC) 40 MG capsule Take 40 mg by mouth 2 (two) times daily.   Polyethylene Glycol 3350 (MIRALAX PO) Take by mouth as needed.   potassium chloride (KLOR-CON) 10 MEQ tablet Take 2 tablets by mouth once daily   pravastatin (PRAVACHOL) 20 MG tablet Take by mouth twice a week   Simethicone (GAS-X PO) Take by mouth.   trolamine salicylate (ASPERCREME) 10 % cream Apply 1 application topically as needed for muscle pain.   No facility-administered encounter medications on file as of 08/11/2022.    Allergies (verified) Crestor [rosuvastatin],  Erythromycin, Jublia [efinaconazole], Lansoprazole, Penicillins, Potassium-containing compounds, Robaxin [methocarbamol], Sulfonamide derivatives, Tea tree oil, and Trental [pentoxifylline er]   History: Past Medical History:  Diagnosis Date   Abdominal pain, epigastric 12/28/2008   Centricity Description: ABDOMINAL PAIN, EPIGASTRIC Qualifier: Diagnosis of  By: [name], [name]   Centricity Description: ABDOMINAL PAIN-EPIGASTRIC Qualifier: Diagnosis of  By: [name], [name]    Allergic rhinitis 09/16/2013   Allergy    Anemia    Anxiety    Anxiety disorder, unspecified 07/06/2017   Arthritis    Blood in stool    BMI 45.0-49.9, adult ([place]) 07/26/2016   Class 3 obesity with serious comorbidity and body mass index (BMI) of 45.0 to 49.9 in adult 03/07/2017   DDD (degenerative disc disease), lumbar    Diabetes mellitus without complication ([place])    DIVERTICULOSIS-COLON 12/28/2008   Qualifier: Diagnosis of  By: [name], [name]    GERD 12/28/2008   Qualifier: Diagnosis of  By: [name], [name]    GERD (gastroesophageal reflux disease)    Headache    Hyperglycemia 03/17/2014   Hypertension    Hypokalemia 07/26/2016   IBS (irritable bowel syndrome)-C-D 07/26/2016   Obesity    Other and unspecified hyperlipidemia 03/17/2014   PERSONAL HX COLONIC POLYPS 12/28/2008   Qualifier: Diagnosis of  By: [name], [name]    Reflux esophagitis 12/28/2008   Qualifier: Diagnosis of  By: Julaine Hua CMA (AAMA), [name]     S/P lumbar spinal fusion 07/02/2012   Tubulovillous adenoma of colon 2008   Past Surgical History:  Procedure Laterality Date   ABDOMINAL HYSTERECTOMY     COLONOSCOPY  2014   laser surgery to remove scar     not sure of the year; after hyst   RADIOLOGY WITH ANESTHESIA N/A 02/25/2015   Procedure: MRI OF LUMBAR SPINE;  Surgeon: Medication Radiologist, MD;  Location: [facility];  Service: Radiology;  Laterality: N/A;  DR. Bennie Pierini COHEN/MRI   SPINE SURGERY      TONSILLECTOMY AND ADENOIDECTOMY     Family History  Problem Relation Age of Onset   Hypertension Mother    Hyperlipidemia Mother    Hyperlipidemia Father    Hypertension Father    Arthritis Father    Hyperlipidemia Sister    Cervical cancer Sister    Diabetes Sister    Heart disease Sister    Cancer Maternal Grandfather    Hypertension Paternal Grandmother    Pancreatic cancer Maternal Aunt    Breast cancer Paternal Aunt    Breast cancer Cousin    Social History   Socioeconomic History   Marital status: Widowed    Spouse name: Not on file   Number of children: Not on file   Years of education: Not on file   Highest education level: Not on file  Occupational History   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   Social Determinants of Health   Financial Resource Strain: Low Risk  (08/11/2022)   Overall Financial Resource Strain (CARDIA)    Difficulty of Paying Living Expenses: Not hard at all  Food Insecurity: No Food Insecurity (08/11/2022)   Hunger Vital Sign    Worried About Running Out of Food in the Last Year: Never true    Ran Out of Food in the Last Year: Never true  Transportation Needs: No Transportation Needs (08/11/2022)   PRAPARE - Hydrologist (Medical): No    Lack of Transportation (Non-Medical): No  Physical Activity: Insufficiently Active (08/11/2022)   Exercise Vital Sign    Days of Exercise  per Week: 5 days    Minutes of Exercise per Session: 20 min  Stress: No Stress Concern Present (08/11/2022)   [place]    Feeling of Stress : Not at all  Social Connections: Moderately Integrated (08/11/2022)   Social Connection and Isolation Panel [NHANES]    Frequency of Communication with Friends and Family: More than three times a week    Frequency of Social Gatherings with Friends and Family: More than three times a week    Attends Religious Services: More than 4 times per year    Active Member of [employer] or Organizations: Yes    Attends Archivist Meetings: More than 4 times per year    Marital Status: Widowed    Tobacco Counseling Counseling given: Not Answered Tobacco comments: Former smoker 06/06/21   Clinical Intake:  Pre-visit preparation completed: Yes  Pain : No/denies painNutrition Risk Assessment:  Has the patient had any N/V/D within the last 2 months?  No  Does the patient have any non-healing wounds?  No  Has the patient had any unintentional weight loss or weight gain?  No   Diabetes:  Is the patient diabetic?  Yes  If diabetic, was a CBG obtained today?   Did the patient bring in their glucometer from home?  No    Financial Strains and Diabetes Management:  Are you having any financial strains with the device, your supplies or your medication? No .  Does the patient want to be seen by Chronic Care Management for management of their diabetes?  No  Would the patient like to be referred to a Nutritionist or for Diabetic Management?  No   Diabetic Exams:  Diabetic Eye Exam: Completed Yes. Overdue for diabetic eye exam. Pt has been advised about the importance in completing this exam. A referral has been placed today. Message sent to referral coordinator for scheduling purposes. Advised pt to expect a call from office referred to regarding appt.  Diabetic Foot Exam: Completed  Scheduled 11/21/21 Pt has been advised about the importance in completing this exam. Pt is scheduled for diabetic foot exam on Followed by Followed  by Dr Jacqualyn Posey     BMI - recorded: 45.41 Nutritional Status: BMI > 30  Obese Nutritional Risks: None Diabetes: Yes CBG done?: No Did pt. bring in CBG monitor from home?: No  How often do you need to have someone help you when you read instructions, pamphlets, or other written materials from your doctor or pharmacy?: 3 - Sometimes (Sister assist)  Diabetic?  Yes  Interpreter Needed?: No  Information entered by :: Rolene Arbour LPN   Activities of Daily Living    08/11/2022    2:52 PM 08/09/2022   12:37 PM  In your present state of health, do you have any difficulty performing the following activities:  Hearing? 0 0  Vision? 0 0  Difficulty concentrating or making decisions? 0 0  Walking or climbing stairs? 1 1  Comment Uses a cane. Followed by Dr Maia Petties   Dressing or bathing? 1 1  Comment Patient stated just not easy   Doing errands, shopping? 0 0  Preparing Food and eating ? N N  Using the Toilet? N Y  In the past six months, have you accidently leaked urine? N Y  Do you have problems with loss of bowel control? N Y  Managing your Medications? N N  Managing your Finances? N N  Housekeeping or managing your Housekeeping? N N    Patient Care Team: [name], [name], MD as PCP - General (Family Medicine) Nahser, Wonda Cheng, MD as PCP - Cardiology (Cardiology)  Indicate any recent Medical Services you may have received from other than [facility] providers in the past year (date may be approximate).     Assessment:   This is a routine wellness examination for [name].  Hearing/Vision screen Hearing Screening - Comments:: Denies hearing difficulties   Vision Screening - Comments:: Wears Reading glasses - up to date with routine eye exams with  Dr Gershon Crane  Dietary issues and exercise activities discussed: Exercise limited by: None  identified   Goals Addressed               This Visit's Progress     Stay Healthy (pt-stated)        Continue to live right.       Depression Screen    08/11/2022    2:50 PM 07/19/2022    2:21 PM 03/31/2022    2:03 PM 03/20/2022    4:11 PM 01/18/2022   11:29 AM 08/10/2021   10:31 AM 07/20/2020    8:23 AM  PHQ 2/9 Scores  PHQ - 2 Score 0 0 0 1 0 0 1  PHQ- 9 Score 0 _0 Fall Risk    08/11/2022    2:58 PM 08/09/2022   12:37 PM 07/19/2022    2:23 PM 03/31/2022    2:02 PM 03/20/2022    4:11 PM  Fall Risk   Falls in the past year? _1 Number falls in past yr: _2 Injury with Fall? 0 0 0 0 0  Comment No injuries or medical attention needed      Risk for fall due to :    History of fall(s) Impaired balance/gait  Follow up Falls prevention discussed   Falls evaluation completed Falls evaluation completed    FALL RISK PREVENTION PERTAINING TO THE HOME:  Any stairs in or around the home? Yes  If so, are there any without handrails? No Home free of loose throw rugs  in walkways, pet beds, electrical cords, etc? Yes Adequate lighting in your home to reduce risk of falls? Yes   ASSISTIVE DEVICES UTILIZED TO PREVENT FALLS:  Life alert? No Use of a cane, walker or w/c? Yes  Grab bars in the bathroom? Yes  Shower chair or bench in shower? Yes  Elevated toilet seat or a handicapped toilet? Yes   TIMED UP AND GO:  Was the test performed? Yes .  Length of time to ambulate 10 feet: 10 sec.   Gait slow and steady with assistive device  Cognitive Function:    02/01/2018    8:55 AM  MMSE - Mini Mental State Exam  Not completed: Refused        08/11/2022    3:02 PM 08/10/2021   10:39 AM 07/20/2020    9:01 AM  6CIT Screen  What Year? 0 points 0 points 0 points  What month? 0 points 0 points 0 points  What time? 0 points 0 points   Count back from 20 0 points 0 points 0 points  Months in reverse 0 points 0 points 0 points  Repeat phrase 0 points 0  points 0 points  Total Score 0 points 0 points     Immunizations Immunization History  Administered Date(s) Administered   Fluad Quad(high Dose 65+) 07/20/2020, 08/17/2021   Influenza Split 07/02/2012, 07/22/2013   Influenza, High Dose Seasonal PF 07/11/2019   Influenza,inj,Quad PF,6+ Mos 06/16/2014, 07/26/2016, 07/06/2017, 07/09/2018   PFIZER(Purple Top)SARS-COV-2 Vaccination 10/31/2019, 11/21/2019, 08/05/2020, 07/26/2021   Pneumococcal Conjugate-13 05/16/2019   Pneumococcal Polysaccharide-23 04/27/2017   Tdap 04/08/2008, 01/07/2018    TDAP status: Up to date  Flu Vaccine status: Due, Education has been provided regarding the importance of this vaccine. Advised may receive this vaccine at local pharmacy or Health Dept. Aware to provide a copy of the vaccination record if obtained from local pharmacy or Health Dept. Verbalized acceptance and understanding.  Pneumococcal vaccine status: Declined,  Education has been provided regarding the importance of this vaccine but patient still declined. Advised may receive this vaccine at local pharmacy or Health Dept. Aware to provide a copy of the vaccination record if obtained from local pharmacy or Health Dept. Verbalized acceptance and understanding.   Covid-19 vaccine status: Completed vaccines  Qualifies for Shingles Vaccine? No   Zostavax completed No   Shingrix Completed?: No.    Education has been provided regarding the importance of this vaccine. Patient has been advised to call insurance company to determine out of pocket expense if they have not yet received this vaccine. Advised may also receive vaccine at local pharmacy or Health Dept. Verbalized acceptance and understanding.  Screening Tests Health Maintenance  Topic Date Due   FOOT EXAM  12/10/2021   OPHTHALMOLOGY EXAM  06/27/2022   Diabetic kidney evaluation - Urine ACR  08/17/2022   COVID-19 Vaccine (5 - Pfizer series) 08/27/2022 (Originally 09/20/2021)   Zoster Vaccines-  Shingrix (1 of 2) 11/11/2022 (Originally 07/26/2003)   INFLUENZA VACCINE  01/07/2023 (Originally 05/09/2022)   HEMOGLOBIN A1C  01/18/2023   Diabetic kidney evaluation - GFR measurement  04/04/2023   Medicare Annual Wellness (AWV)  08/12/2023   MAMMOGRAM  08/24/2023   COLONOSCOPY (Pts 45-42yr Insurance coverage will need to be confirmed)  10/17/2023   TETANUS/TDAP  01/08/2028   DEXA SCAN  Completed   Hepatitis C Screening  Completed   HPV VACCINES  Aged Out   Pneumonia Vaccine 71 Years old  [place]  Health Maintenance Due  Topic Date Due   FOOT EXAM  12/10/2021   OPHTHALMOLOGY EXAM  06/27/2022   Diabetic kidney evaluation - Urine ACR  08/17/2022    Colorectal cancer screening: Type of screening: Colonoscopy. Completed 10/16/18. Repeat every 5 years  Mammogram status: Completed 08/23/21. Repeat every year  Bone Density status: Completed 08/15/19. Results reflect: Bone density results: OSTEOPOROSIS. Repeat every   years.  Lung Cancer Screening: (Low Dose CT Chest recommended if Age 52-80 years, 30 pack-year currently smoking OR have quit w/in 15years.) does not qualify.     Additional Screening:  Hepatitis C Screening: does qualify; Completed 02/17/16  Vision Screening: Recommended annual ophthalmology exams for early detection of glaucoma and other disorders of the eye. Is the patient up to date with their annual eye exam?  Yes  Who is the provider or what is the name of the office in which the patient attends annual eye exams? Dr Gershon Crane If pt is not established with a provider, would they like to be referred to a provider to establish care? No .   Dental Screening: Recommended annual dental exams for proper oral hygiene  Community Resource Referral / Chronic Care Management:  CRR required this visit?  No   CCM required this visit?  No      Plan:     I have personally reviewed and noted the following in the patient's chart:   Medical and  social history Use of alcohol, tobacco or illicit drugs  Current medications and supplements including opioid prescriptions. Patient is currently taking opioid prescriptions. Information provided to patient regarding non-opioid alternatives. Patient advised to discuss non-opioid treatment plan with their provider. Functional ability and status Nutritional status Physical activity Advanced directives List of other physicians Hospitalizations, surgeries, and ER visits in previous 12 months Vitals Screenings to include cognitive, depression, and falls Referrals and appointments  In addition, I have reviewed and discussed with patient certain preventive protocols, quality metrics, and best practice recommendations. A written personalized care plan for preventive services as well as general preventive health recommendations were provided to patient.     Criselda Peaches, LPN   [id number]   Nurse Notes: None

## 2022-08-11 NOTE — Patient Instructions (Addendum)
[name] , Thank you for taking time to come for your Medicare Wellness Visit. I appreciate your ongoing commitment to your health goals. Please review the following plan we discussed and let me know if I can assist you in the future.   These are the goals we discussed:  Goals       Patient Stated      Plan a trip and travel when you can.       Patient Stated      Lose weight      Patient Stated      Get weight down      Stay Healthy (pt-stated)      Continue to live right.        This is a list of the screening recommended for you and due dates:  Health Maintenance  Topic Date Due   Complete foot exam   12/10/2021   Eye exam for diabetics  06/27/2022   Yearly kidney health urinalysis for diabetes  08/17/2022   COVID-19 Vaccine (5 - Pfizer series) 08/27/2022*   Zoster (Shingles) Vaccine (1 of 2) 11/11/2022*   Flu Shot  01/07/2023*   Hemoglobin A1C  01/18/2023   Yearly kidney function blood test for diabetes  04/04/2023   Medicare Annual Wellness Visit  08/12/2023   Mammogram  08/24/2023   Colon Cancer Screening  10/17/2023   Tetanus Vaccine  01/08/2028   DEXA scan (bone density measurement)  Completed   Hepatitis C Screening: USPSTF Recommendation to screen - Ages 36-79 yo.  Completed   HPV Vaccine  Aged Out   Pneumonia Vaccine  Discontinued  *Topic was postponed. The date shown is not the original due date.   Opioid Pain Medicine Management Opioids are powerful medicines that are used to treat moderate to severe pain. When used for short periods of time, they can help you to: Sleep better. Do better in physical or occupational therapy. Feel better in the first few days after an injury. Recover from surgery. Opioids should be taken with the supervision of a trained health care provider. They should be taken for the shortest period of time possible. This is because opioids can be addictive, and the longer you take opioids, the greater your risk of addiction. This  addiction can also be called opioid use disorder. What are the risks? Using opioid pain medicines for longer than 3 days increases your risk of side effects. Side effects include: Constipation. Nausea and vomiting. Breathing difficulties (respiratory depression). Drowsiness. Confusion. Opioid use disorder. Itching. Taking opioid pain medicine for a long period of time can affect your ability to do daily tasks. It also puts you at risk for: Motor vehicle crashes. Depression. Suicide. Heart attack. Overdose, which can be life-threatening. What is a pain treatment plan? A pain treatment plan is an agreement between you and your health care provider. Pain is unique to each person, and treatments vary depending on your condition. To manage your pain, you and your health care provider need to work together. To help you do this: Discuss the goals of your treatment, including how much pain you might expect to have and how you will manage the pain. Review the risks and benefits of taking opioid medicines. Remember that a good treatment plan uses more than one approach and minimizes the chance of side effects. Be honest about the amount of medicines you take and about any drug or alcohol use. Get pain medicine prescriptions from only one health care provider. Pain can be  managed with many types of alternative treatments. Ask your health care provider to refer you to one or more specialists who can help you manage pain through: Physical or occupational therapy. Counseling (cognitive behavioral therapy). Good nutrition. Biofeedback. Massage. Meditation. Non-opioid medicine. Following a gentle exercise program. How to use opioid pain medicine Taking medicine Take your pain medicine exactly as told by your health care provider. Take it only when you need it. If your pain gets less severe, you may take less than your prescribed dose if your health care provider approves. If you are not having  pain, do nottake pain medicine unless your health care provider tells you to take it. If your pain is severe, do nottry to treat it yourself by taking more pills than instructed on your prescription. Contact your health care provider for help. Write down the times when you take your pain medicine. It is easy to become confused while on pain medicine. Writing the time can help you avoid overdose. Take other over-the-counter or prescription medicines only as told by your health care provider. Keeping yourself and others safe  While you are taking opioid pain medicine: Do not drive, use machinery, or power tools. Do not sign legal documents. Do not drink alcohol. Do not take sleeping pills. Do not supervise children by yourself. Do not do activities that require climbing or being in high places. Do not go to a lake, river, ocean, spa, or swimming pool. Do not share your pain medicine with anyone. Keep pain medicine in a locked cabinet or in a secure area where pets and children cannot reach it. Stopping your use of opioids If you have been taking opioid medicine for more than a few weeks, you may need to slowly decrease (taper) how much you take until you stop completely. Tapering your use of opioids can decrease your risk of symptoms of withdrawal, such as: Pain and cramping in the abdomen. Nausea. Sweating. Sleepiness. Restlessness. Uncontrollable shaking (tremors). Cravings for the medicine. Do not attempt to taper your use of opioids on your own. Talk with your health care provider about how to do this. Your health care provider may prescribe a step-down schedule based on how much medicine you are taking and how long you have been taking it. Getting rid of leftover pills Do not save any leftover pills. Get rid of leftover pills safely by: Taking the medicine to a prescription take-back program. This is usually offered by the county or law enforcement. Bringing them to a pharmacy that  has a drug disposal container. Flushing them down the toilet. Check the label or package insert of your medicine to see whether this is safe to do. Throwing them out in the trash. Check the label or package insert of your medicine to see whether this is safe to do. If it is safe to throw it out, remove the medicine from the original container, put it into a sealable bag or container, and mix it with used coffee grounds, food scraps, dirt, or cat litter before putting it in the trash. Follow these instructions at home: Activity Do exercises as told by your health care provider. Avoid activities that make your pain worse. Return to your normal activities as told by your health care provider. Ask your health care provider what activities are safe for you. General instructions You may need to take these actions to prevent or treat constipation: Drink enough fluid to keep your urine pale yellow. Take over-the-counter or prescription medicines. Eat foods that  are high in fiber, such as beans, whole grains, and fresh fruits and vegetables. Limit foods that are high in fat and processed sugars, such as fried or sweet foods. Keep all follow-up visits. This is important. Where to find support If you have been taking opioids for a long time, you may benefit from receiving support for quitting from a local support group or counselor. Ask your health care provider for a referral to these resources in your area. Where to find more information Centers for Disease Control and Prevention (CDC): http://www.wolf.info/ [place] Food and Drug Administration (FDA): [place] Get help right away if: You may have taken too much of an opioid (overdosed). Common symptoms of an overdose: Your breathing is slower or more shallow than normal. You have a very slow heartbeat (pulse). You have slurred speech. You have nausea and vomiting. Your pupils become very small. You have other potential symptoms: You are very confused. You  faint or feel like you will faint. You have cold, clammy skin. You have blue lips or fingernails. You have thoughts of harming yourself or harming others. These symptoms may represent a serious problem that is an emergency. Do not wait to see if the symptoms will go away. Get medical help right away. Call your local emergency services (911 in the [place] Do not drive yourself to the hospital.  If you ever feel like you may hurt yourself or others, or have thoughts about taking your own life, get help right away. Go to your nearest emergency department or: Call your local emergency services (911 in the [place] Call the [facility] (608)571-4617 in the [place] Call a suicide crisis helpline, such as the [facility] at 838-459-0251 or 988 in the [place]. This is open 24 hours a day in the [place] Text the Crisis Text Line at (203)120-1479 (in the [place].). Summary Opioid medicines can help you manage moderate to severe pain for a short period of time. A pain treatment plan is an agreement between you and your health care provider. Discuss the goals of your treatment, including how much pain you might expect to have and how you will manage the pain. If you think that you or someone else may have taken too much of an opioid, get medical help right away. This information is not intended to replace advice given to you by your health care provider. Make sure you discuss any questions you have with your health care provider. Document Revised: 04/20/2021 Document Reviewed: 01/05/2021 Elsevier Patient Education  [place] directives: Advance directive discussed with you today. Even though you declined this today, please call our office should you change your mind, and we can give you the proper paperwork for you to fill out.   Conditions/risks identified: None  Next appointment: Follow up in one year for your annual wellness visit    Preventive Care 65  Years and Older, Female Preventive care refers to lifestyle choices and visits with your health care provider that can promote health and wellness. What does preventive care include? A yearly physical exam. This is also called an annual well check. Dental exams once or twice a year. Routine eye exams. Ask your health care provider how often you should have your eyes checked. Personal lifestyle choices, including: Daily care of your teeth and gums. Regular physical activity. Eating a healthy diet. Avoiding tobacco and drug use. Limiting alcohol use. Practicing safe sex. Taking low-dose aspirin every day. Taking vitamin and mineral supplements  as recommended by your health care provider. What happens during an annual well check? The services and screenings done by your health care provider during your annual well check will depend on your age, overall health, lifestyle risk factors, and family history of disease. Counseling  Your health care provider may ask you questions about your: Alcohol use. Tobacco use. Drug use. Emotional well-being. Home and relationship well-being. Sexual activity. Eating habits. History of falls. Memory and ability to understand (cognition). Work and work Statistician. Reproductive health. Screening  You may have the following tests or measurements: Height, weight, and BMI. Blood pressure. Lipid and cholesterol levels. These may be checked every 5 years, or more frequently if you are over 54 years old. Skin check. Lung cancer screening. You may have this screening every year starting at age 30 if you have a 30-pack-year history of smoking and currently smoke or have quit within the past 15 years. Fecal occult blood test (FOBT) of the stool. You may have this test every year starting at age 46. Flexible sigmoidoscopy or colonoscopy. You may have a sigmoidoscopy every 5 years or a colonoscopy every 10 years starting at age 5. Hepatitis C blood  test. Hepatitis B blood test. Sexually transmitted disease (STD) testing. Diabetes screening. This is done by checking your blood sugar (glucose) after you have not eaten for a while (fasting). You may have this done every 1-3 years. Bone density scan. This is done to screen for osteoporosis. You may have this done starting at age 54. Mammogram. This may be done every 1-2 years. Talk to your health care provider about how often you should have regular mammograms. Talk with your health care provider about your test results, treatment options, and if necessary, the need for more tests. Vaccines  Your health care provider may recommend certain vaccines, such as: Influenza vaccine. This is recommended every year. Tetanus, diphtheria, and acellular pertussis (Tdap, Td) vaccine. You may need a Td booster every 10 years. Zoster vaccine. You may need this after age 5. Pneumococcal 13-valent conjugate (PCV13) vaccine. One dose is recommended after age 25. Pneumococcal polysaccharide (PPSV23) vaccine. One dose is recommended after age 37. Talk to your health care provider about which screenings and vaccines you need and how often you need them. This information is not intended to replace advice given to you by your health care provider. Make sure you discuss any questions you have with your health care provider. Document Released: 10/22/2015 Document Revised: 06/14/2016 Document Reviewed: 07/27/2015 Elsevier Interactive Patient Education  2017 [place] Prevention in the Home Falls can cause injuries. They can happen to people of all ages. There are many things you can do to make your home safe and to help prevent falls. What can I do on the outside of my home? Regularly fix the edges of walkways and driveways and fix any cracks. Remove anything that might make you trip as you walk through a door, such as a raised step or threshold. Trim any bushes or trees on the path to your home. Use  bright outdoor lighting. Clear any walking paths of anything that might make someone trip, such as rocks or tools. Regularly check to see if handrails are loose or broken. Make sure that both sides of any steps have handrails. Any raised decks and porches should have guardrails on the edges. Have any leaves, snow, or ice cleared regularly. Use sand or salt on walking paths during winter. Clean up any spills in your garage  right away. This includes oil or grease spills. What can I do in the bathroom? Use night lights. Install grab bars by the toilet and in the tub and shower. Do not use towel bars as grab bars. Use non-skid mats or decals in the tub or shower. If you need to sit down in the shower, use a plastic, non-slip stool. Keep the floor dry. Clean up any water that spills on the floor as soon as it happens. Remove soap buildup in the tub or shower regularly. Attach bath mats securely with double-sided non-slip rug tape. Do not have throw rugs and other things on the floor that can make you trip. What can I do in the bedroom? Use night lights. Make sure that you have a light by your bed that is easy to reach. Do not use any sheets or blankets that are too big for your bed. They should not hang down onto the floor. Have a firm chair that has side arms. You can use this for support while you get dressed. Do not have throw rugs and other things on the floor that can make you trip. What can I do in the kitchen? Clean up any spills right away. Avoid walking on wet floors. Keep items that you use a lot in easy-to-reach places. If you need to reach something above you, use a strong step stool that has a grab bar. Keep electrical cords out of the way. Do not use floor polish or wax that makes floors slippery. If you must use wax, use non-skid floor wax. Do not have throw rugs and other things on the floor that can make you trip. What can I do with my stairs? Do not leave any items on the  stairs. Make sure that there are handrails on both sides of the stairs and use them. Fix handrails that are broken or loose. Make sure that handrails are as long as the stairways. Check any carpeting to make sure that it is firmly attached to the stairs. Fix any carpet that is loose or worn. Avoid having throw rugs at the top or bottom of the stairs. If you do have throw rugs, attach them to the floor with carpet tape. Make sure that you have a light switch at the top of the stairs and the bottom of the stairs. If you do not have them, ask someone to add them for you. What else can I do to help prevent falls? Wear shoes that: Do not have high heels. Have rubber bottoms. Are comfortable and fit you well. Are closed at the toe. Do not wear sandals. If you use a stepladder: Make sure that it is fully opened. Do not climb a closed stepladder. Make sure that both sides of the stepladder are locked into place. Ask someone to hold it for you, if possible. Clearly mark and make sure that you can see: Any grab bars or handrails. First and last steps. Where the edge of each step is. Use tools that help you move around (mobility aids) if they are needed. These include: Canes. Walkers. Scooters. Crutches. Turn on the lights when you go into a dark area. Replace any light bulbs as soon as they burn out. Set up your furniture so you have a clear path. Avoid moving your furniture around. If any of your floors are uneven, fix them. If there are any pets around you, be aware of where they are. Review your medicines with your doctor. Some medicines can make you  feel dizzy. This can increase your chance of falling. Ask your doctor what other things that you can do to help prevent falls. This information is not intended to replace advice given to you by your health care provider. Make sure you discuss any questions you have with your health care provider. Document Released: 07/22/2009 Document Revised:  03/02/2016 Document Reviewed: 10/30/2014 Elsevier Interactive Patient Education  2017 [place]

## 2022-08-14 ENCOUNTER — Ambulatory Visit: Payer: Medicare PPO | Attending: Obstetrics and Gynecology | Admitting: Physical Therapy

## 2022-08-14 DIAGNOSIS — R278 Other lack of coordination: Secondary | ICD-10-CM | POA: Diagnosis not present

## 2022-08-14 DIAGNOSIS — M6281 Muscle weakness (generalized): Secondary | ICD-10-CM | POA: Insufficient documentation

## 2022-08-14 DIAGNOSIS — R159 Full incontinence of feces: Secondary | ICD-10-CM | POA: Diagnosis not present

## 2022-08-14 DIAGNOSIS — R32 Unspecified urinary incontinence: Secondary | ICD-10-CM | POA: Insufficient documentation

## 2022-08-14 NOTE — Therapy (Signed)
OUTPATIENT PHYSICAL THERAPY TREATMENT NOTE   Patient Name: [name] MRN: [id number] DOB:[birth year], 71 y.o., female Today's Date: 08/14/2022  PCP: [name], [name], MD  REFERRING PROVIDER: Jaquita Folds, MD   END OF SESSION:   PT End of Session - 08/14/22 1531     Visit Number 3    Date for PT Re-Evaluation 10/06/22    Authorization Type humana    Authorization Time Period 3    Authorization - Visit Number 3    Authorization - Number of Visits 12    Progress Note Due on Visit 10    PT Start Time 1530    PT Stop Time 1610    PT Time Calculation (min) 40 min    Activity Tolerance Patient tolerated treatment well    Behavior During Therapy [facility] for tasks assessed/performed             Past Medical History:  Diagnosis Date   Abdominal pain, epigastric 12/28/2008   Centricity Description: ABDOMINAL PAIN, EPIGASTRIC Qualifier: Diagnosis of  By: [name], [name]   Centricity Description: ABDOMINAL PAIN-EPIGASTRIC Qualifier: Diagnosis of  By: [name], [name]    Allergic rhinitis 09/16/2013   Allergy    Anemia    Anxiety    Anxiety disorder, unspecified 07/06/2017   Arthritis    Blood in stool    BMI 45.0-49.9, adult ([place]) 07/26/2016   Class 3 obesity with serious comorbidity and body mass index (BMI) of 45.0 to 49.9 in adult 03/07/2017   DDD (degenerative disc disease), lumbar    Diabetes mellitus without complication ([place])    DIVERTICULOSIS-COLON 12/28/2008   Qualifier: Diagnosis of  By: [name], [name]    GERD 12/28/2008   Qualifier: Diagnosis of  By: [name], [name]    GERD (gastroesophageal reflux disease)    Headache    Hyperglycemia 03/17/2014   Hypertension    Hypokalemia 07/26/2016   IBS (irritable bowel syndrome)-C-D 07/26/2016   Obesity    Other and unspecified hyperlipidemia 03/17/2014   PERSONAL HX COLONIC POLYPS 12/28/2008   Qualifier: Diagnosis of  By: [name], [name]    Reflux esophagitis  12/28/2008   Qualifier: Diagnosis of  By: [name], [name]     S/P lumbar spinal fusion 07/02/2012   Tubulovillous adenoma of colon 2008   Past Surgical History:  Procedure Laterality Date   ABDOMINAL HYSTERECTOMY     COLONOSCOPY  2014   laser surgery to remove scar     not sure of the year; after hyst   RADIOLOGY WITH ANESTHESIA N/A 02/25/2015   Procedure: MRI OF LUMBAR SPINE;  Surgeon: Medication Radiologist, MD;  Location: [facility];  Service: Radiology;  Laterality: N/A;  DR. Bennie Pierini COHEN/MRI   SPINE SURGERY     TONSILLECTOMY AND ADENOIDECTOMY     Patient Active Problem List   Diagnosis Date Noted   Rectal bleed 03/31/2022   Vaginal itching 09/27/2021   Atypical atrial flutter ([place]) 07/05/2021   Secondary hypercoagulable state ([place]) 06/06/2021   Atrial fibrillation ([place]) 04/16/2020   Symptomatic spider varicose vein 05/15/2018   Bradycardia, sinus 09/07/2017   Anxiety disorder, unspecified 07/06/2017   Hyperlipidemia associated with type 2 diabetes mellitus ([place]) 04/26/2017   Morbid obesity ([place]) 03/07/2017   Hypokalemia 07/26/2016   IBS (irritable bowel syndrome)-C-D 07/26/2016   Morbid obesity with BMI of 45.0-49.9, adult ([place]) 07/26/2016   Type 2 diabetes mellitus with other specified complication ([place]) 76/73/4193   Essential hypertension 03/17/2014  Other and unspecified hyperlipidemia 03/17/2014   Hyperglycemia 03/17/2014   Allergic rhinitis 09/16/2013   Arthritis 07/02/2012   S/P lumbar spinal fusion 07/02/2012   REFLUX ESOPHAGITIS 12/28/2008   GERD 12/28/2008   DIVERTICULOSIS-COLON 12/28/2008   Abdominal pain, epigastric 12/28/2008   PERSONAL HX COLONIC POLYPS 12/28/2008   REFERRING DIAG:  N32.81 (ICD-10-CM) - Overactive bladder  N39.3 (ICD-10-CM) - SUI (stress urinary incontinence, female)  R15.9 (ICD-10-CM) - Incontinence of feces, unspecified fecal incontinence type  M62.838 (ICD-10-CM) - Levator spasm      THERAPY DIAG:  Muscle weakness  (generalized) - Plan: PT plan of care cert/re-cert   Other lack of coordination - Plan: PT plan of care cert/re-cert   Incontinence of feces, unspecified fecal incontinence type - Plan: PT plan of care cert/re-cert   Urinary incontinence, unspecified type - Plan: PT plan of care cert/re-cert   Rationale for Evaluation and Treatment Rehabilitation   ONSET DATE: 2018   SUBJECTIVE:                                                                                                                                                                                            SUBJECTIVE STATEMENT: I went to take my trash out and not using my cane. I am not able to feel my feet and stepped wrong and went down the ramp. My right side is hurting from my fall last Thursday. I am seeing the doctor tomorrow.  I am urinating better. I am not straining as much to urinate.   PAIN:  Are you having pain? Yes: NPRS scale: 8/10 Pain location: right side of the body Pain description: constant Aggravating factors: movement Relieving factors: rest   PAIN:  Are you having pain? No   PRECAUTIONS: None   WEIGHT BEARING RESTRICTIONS No   FALLS:  Has patient fallen in last 6 months? No   LIVING ENVIRONMENT: Lives with: lives alone   OCCUPATION: retired   PLOF: Independent   PATIENT GOALS reduce the number of pads she uses   PERTINENT HISTORY:  A Fib; DM; chronic anticoagulation; bladder injury with her hysterectomy ;history of bladder instillations with DMSO ; IBS   BOWEL MOVEMENT Pain with bowel movement: No Type of bowel movement:Type (Bristol Stool Scale) Type 4 or Type 6, Frequency 1-4 times per day, Strain Yes, and Splinting yes Fully empty rectum: No Leakage: Yes: daily Pads: 4 pads and 3 depends daily   Fiber supplement: Yes: Miralx  for constipation,  Immodium for diarrhea   URINATION Pain with urination: No Fully empty bladder: No; push on her belly or vagina to empty bladder Stream:  Weak  Urgency: No Frequency: Day time voids- usually every few hours.  Nocturia: 3 times per night to void Leakage: Urge to void, Walking to the bathroom, Coughing, Sneezing, Laughing, Lifting, and sit to stand Pads: 4 pads and 3 depends daily     PREGNANCY Vaginal deliveries 1     OBJECTIVE:    DIAGNOSTIC FINDINGS:  Pelvic floor strength I/V, puborectalis I/V external anal sphincter II/V; PVR of 31 ml was obtained by bladder scan.   Pelvic floor musculature: Right levator tender, Right obturator tender, Left levator tender, Left obturator tender   PATIENT SURVEYS:  PFIQ-7 159 ; UIQ -7 81; CRAIQ-7 81   COGNITION:            Overall cognitive status: Within functional limits for tasks assessed                          SENSATION:            Light touch: Appears intact            Proprioception: Appears intact     GAIT: Assistive device utilized: Single point cane Level of assistance: Complete Independence Comments: walks with small steps                POSTURE: No Significant postural limitations, rounded shoulders, forward head, and anterior pelvic tilt               PELVIC ALIGNMENT:correct   LUMBARAROM/PROM   A/PROM A/PROM  eval  Flexion    Extension    Right lateral flexion    Left lateral flexion    Right rotation    Left rotation     (Blank rows = not tested)   LOWER EXTREMITY ROM:   Passive ROM Right eval Left eval  Hip flexion      Hip extension      Hip abduction      Hip adduction      Hip internal rotation      Hip external rotation      Knee flexion      Knee extension      Ankle dorsiflexion      Ankle plantarflexion      Ankle inversion      Ankle eversion       (Blank rows = not tested)   LOWER EXTREMITY MMT:   MMT Right eval Left eval  Hip flexion 90 90  Hip extension      Hip abduction      Hip adduction      Hip internal rotation      Hip external rotation 40 40  Knee flexion      Knee extension      Ankle dorsiflexion       Ankle plantarflexion      Ankle inversion      Ankle eversion         PALPATION:   General  Patient able to tighten her abdominals correctly                 External Perineal Exam stool came dripping out without control                             Internal Pelvic Floor No movement of the pelvic floor muscles, the anus was wide open    Patient confirms identification and approves PT to assess internal pelvic floor and treatment Yes  PELVIC MMT:   MMT eval  Vaginal 0/5  Internal Anal Sphincter 0/5  External Anal Sphincter 0/5  Puborectalis 0/5  (Blank rows = not tested)         TONE: increased   TODAY'S TREATMENT  08/14/2022  Neuromuscular re-education: Core facilitation:weight shifting side to side with pelvic floor contraction  Standing marching with contract pelvic floor and abdominals and not dropping the buttocks on the side the leg is lifting Mini squat with pelvic floor enagement 10x Sitting ball squeeze holding for 5 sec 10x  Sitting with ball squeeze knee extension 20x each side with pelvic floor contraction Sitting pressing down on foam roll holding 5 sec wiit pelvic floor contraction 10x  Walking sideways over orange bars with pelvic floor contraction 4 times Stepping over orange bars 4 times with pelvic floor contraction Strengthening: nustep for 6 minutes level 4 while assessing patient.  Self-care: Discussed with patient on timed voiding to go every 2 hours since she has trouble with sensation in the rectal area.     08/02/2022 Neuromuscular re-education: Pelvic floor contraction training:right sidely with pillow between knees squeezing it with pelvic floor contraction holding 5 sec 10x 2                                     Sidely press hands on thighs holding for 5 sec to engage the lower abdominal with pelvic floor 10 x 2                                     Sitting ball sqeeze hold 5 sec 10x 2                                     Sitting hip abduction  with yellow band 10x2 Exercises: Strengthening: nustep level 3 for 5 mintues      PATIENT EDUCATION: 08/02/2022 Education details: Access Code: [email or web address] Person educated: Patient Education method: Explanation, Demonstration, Tactile cues, Verbal cues, and Handouts Education comprehension: verbalized understanding, returned demonstration, verbal cues required, tactile cues required, and needs further education     HOME EXERCISE PROGRAM: 08/02/2022 Access Code: [email or web address] URL: [email or web address] Date: 08/02/2022 Prepared by: Earlie Counts   Exercises - Supine Hip Adduction Isometric with Ball  - 1 x daily - 7 x weekly - 2 sets - 10 reps - Supine Transversus Abdominis Bracing - Hands on Thighs  - 1 x daily - 7 x weekly - 2 sets - 10 reps - 5 sec hold - Seated Hip Adduction Squeeze with Ball  - 1 x daily - 7 x weekly - 2 sets - 10 reps - 5 sec hold - Seated Hip Abduction with Resistance  - 1 x daily - 7 x weekly - 2 sets - 10 reps None today   ASSESSMENT:   CLINICAL IMPRESSION: Patient is a 71 y.o. female who was seen today for physical therapy  treatment for overactive bladder, stress urinary incontinence,fecal incontinence, and levator spasm. Patient fell last week when she was taking her garbage out   and not using her cane. She will be seeing her doctor tomorrow due to the fall. Patient does not have to strain as much to urinate. Patient reports she is having less urinary leakage.  Patient is  doing her timed voiding every 2 hours. Her buttocks is numb so she is not able to feel the stool leakage. Patient will benefit from skilled therapy to improve pelvic floor strength and coordination to reduce leakage.      OBJECTIVE IMPAIRMENTS decreased activity tolerance, decreased coordination, decreased endurance, decreased strength, increased fascial restrictions, and impaired tone.    ACTIVITY LIMITATIONS carrying, lifting, bending, standing, transfers, continence, and  toileting   PARTICIPATION LIMITATIONS: meal prep, cleaning, shopping, and community activity   PERSONAL FACTORS Age, Fitness, Past/current experiences, Time since onset of injury/illness/exacerbation, and 3+ comorbidities: A Fib; DM; chronic anticoagulation; bladder injury with her hysterectomy ;history of bladder instillations with DMSO ; IBS  are also affecting patient's functional outcome.    REHAB POTENTIAL: Good   CLINICAL DECISION MAKING: Evolving/moderate complexity   EVALUATION COMPLEXITY: Moderate     GOALS: Goals reviewed with patient? Yes   SHORT TERM GOALS: Target date: 08/11/2022   Patient able to contract the pelvic floor to 1/5 due to improve coordination.  Baseline: Goal status: INITIAL   2.  Patient on a timed voiding program every 2 hours to reduce leakage.  Baseline:  Goal status: Met 08/14/2022   3.  Patient is able to start feeling stool leakage.  Baseline:  Goal status: INITIAL     LONG TERM GOALS: Target date: 10/06/2022    Patient independent with her HEP for pelvic floor strengthening.  Baseline:  Goal status: INITIAL   2.  Patient is able to reduce her pad usage to 1-2 per day due to increased In pelvic floor strength >/= 3/5.  Baseline:  Goal status: INITIAL   3.  Patient anus is able to hug therapist finger due to improved pelvic floor contraction and reduction of fecal leakage.  Baseline:  Goal status: INITIAL   4.  PFIQ-7 decreased </= 80 from 159 due to improved leakage and reduction of her frustration with the leakage.  Baseline:  Goal status: INITIAL   5.  Patient went from sit to stand with urinary leakage decreased >/= 50%.  Baseline:  Goal status: INITIAL     PLAN: PT FREQUENCY: 1x/week   PT DURATION: 12 weeks   PLANNED INTERVENTIONS: Therapeutic exercises, Therapeutic activity, Neuromuscular re-education, Patient/Family education, Self Care, Biofeedback, and Manual therapy   PLAN FOR NEXT SESSION: work on pelvic floor  contraction,  see what the doctor says. Standing exercises sit to stand Earlie Counts, PT 08/14/22 4:18 PM

## 2022-08-14 NOTE — Progress Notes (Unsigned)
ACUTE VISIT Chief Complaint  Patient presents with   ear pressure    Right ear   [place] last Thursday, right side still sore & has some swelling.   HPI: [name] is a 71 y.o. female, who is here today complaining of of right ear pressure and recent pressure like pain on the right side of the face, which she thinks might be related to her "sinuses."  She reports that the pressure was also located in the right parietal region. Sinus Problem This is a new problem. The current episode started in the past 7 days. The problem has been gradually improving since onset. There has been no fever. Associated symptoms include congestion, ear pain, headaches and sinus pressure. Pertinent negatives include no chills, coughing, diaphoresis, hoarse voice, shortness of breath, sneezing, sore throat or swollen glands.  She is using Flonase nasal spray daily.  Symptoms improved after one episode of epistaxis from her left nostril when blowing her nose on Saturday.  She also mentions experiencing drainage from the right ear, describing it as clear fluid.   She denies any fever, chills, sore throat, or recent exposure to sick individuals.   She does report wheezing, which she attributes to being overweight and "overexerting" herself during physical activity. States that she has discussed this with her cardiologist, who suggested that the wheezing could be related to her heart. This is not a new problem.  She has a history of smoking but quit in 1999.   She mentions that she is currently on a regimen of getting up every two hours to urinate , which was recommended by urologist.She has been taking physical therapy for urine/fecal incontinence and has experienced some improvement.   She experienced a fall on Thursday and has had some stiffness and soreness following her fall but is able to perform her normal activities. She has not noted deformities, joint edema,or changes in ROM. Hx of  chronic pain (generalized OA lumbar radiculopathy).  Review of Systems  Constitutional:  Positive for fatigue. Negative for chills and diaphoresis.  HENT:  Positive for congestion, ear pain, postnasal drip, rhinorrhea and sinus pressure. Negative for hearing loss, hoarse voice, sneezing and sore throat.   Respiratory:  Negative for cough and shortness of breath.   Gastrointestinal:  Negative for nausea and vomiting.  Genitourinary:  Negative for dysuria and hematuria.  Musculoskeletal:  Positive for arthralgias, back pain and gait problem.  Skin:  Negative for rash and wound.  Neurological:  Positive for headaches. Negative for syncope.  Psychiatric/Behavioral:  Positive for sleep disturbance. Negative for confusion. The patient is nervous/anxious.   Rest see pertinent positives and negatives per HPI.  Current Outpatient Medications on File Prior to Visit  Medication Sig Dispense Refill   Accu-Chek Softclix Lancets lancets Use to check blood sugars 1-2 times daily. 100 each 12   amLODipine (NORVASC) 5 MG tablet Take 1 tablet by mouth once daily 90 tablet 3   Aspirin-Salicylamide-Caffeine (BC FAST PAIN RELIEF) 650-195-33.3 MG PACK Take 1 tablet by mouth as needed.     atenolol (TENORMIN) 25 MG tablet Take 0.5 tablets (12.5 mg total) by mouth daily. May take an extra 1/2 tablet for breakthrough afib 120 tablet 3   azelastine (ASTELIN) 0.1 % nasal spray Place 2 sprays into both nostrils 2 (two) times daily. Use in each nostril as directed 30 mL 6   bisacodyl (BISACODYL LAXATIVE) 10 MG suppository Place 1 suppository (10 mg total) rectally daily as needed  for moderate constipation. 30 suppository 1   Blood Glucose Monitoring Suppl (ACCU-CHEK GUIDE ME) w/Device KIT Use to test blood sugars 1-2 times daily. 1 kit 0   calcium carbonate (TUMS EX) 750 MG chewable tablet Chew 2 tablets by mouth daily as needed for heartburn.     Cetirizine HCl (ZYRTEC ALLERGY PO) Take by mouth.     ciclopirox (PENLAC)  8 % solution Apply topically at bedtime. Apply over nail and surrounding skin. Apply daily over previous coat. After seven (7) days, may remove with alcohol and continue cycle. 6.6 mL 0   cyclobenzaprine (FLEXERIL) 10 MG tablet Take 2.5 mg by mouth daily as needed (pain level 8 or greater).      Docusate Sodium (STOOL SOFTENER LAXATIVE PO) Take by mouth.     ELIQUIS 5 MG TABS tablet Take 1 tablet by mouth twice daily 180 tablet 3   fluticasone (FLONASE) 50 MCG/ACT nasal spray Use 2 spray(s) in each nostril once daily 48 g 0   glucose blood (ACCU-CHEK GUIDE) test strip Use to test blood sugars 1-2 times daily. 100 each 12   hydrochlorothiazide (HYDRODIURIL) 25 MG tablet TAKE 1 TABLET BY MOUTH ONCE DAILY . APPOINTMENT REQUIRED FOR FUTURE REFILLS 90 tablet 0   HYDROcodone-acetaminophen (NORCO/VICODIN) 5-325 MG tablet Take 0.5 tablets by mouth daily as needed (pain level 8 or greater). 30 tablet 0   hydrocortisone 2.5 % ointment Daily as needed. 60 g 3   hydrOXYzine (ATARAX/VISTARIL) 25 MG tablet Take 1 tablet (25 mg total) by mouth 3 (three) times daily as needed for anxiety. 90 tablet 3   Magnesium Hydroxide (DULCOLAX PO) Take by mouth.     Menthol-Zinc Oxide (CALMOSEPTINE) 0.44-20.6 % OINT Apply topically.     miconazole (REMEDY PHYTOPLEX ANTIFUNGAL) 2 % powder Apply topically as needed for itching.     Multiple Vitamins-Minerals (MULTI-VITAMIN GUMMIES PO) Take by mouth as needed.     Olopatadine HCl 0.2 % SOLN Apply to eye daily.     omeprazole (PRILOSEC) 40 MG capsule Take 40 mg by mouth 2 (two) times daily.     Polyethylene Glycol 3350 (MIRALAX PO) Take by mouth as needed.     potassium chloride (KLOR-CON) 10 MEQ tablet Take 2 tablets by mouth once daily 180 tablet 1   pravastatin (PRAVACHOL) 20 MG tablet Take by mouth twice a week 27 tablet 3   Simethicone (GAS-X PO) Take by mouth.     trolamine salicylate (ASPERCREME) 10 % cream Apply 1 application topically as needed for muscle pain.      No current facility-administered medications on file prior to visit.   Past Medical History:  Diagnosis Date   Abdominal pain, epigastric 12/28/2008   Centricity Description: ABDOMINAL PAIN, EPIGASTRIC Qualifier: Diagnosis of  By: [name], [name]   Centricity Description: ABDOMINAL PAIN-EPIGASTRIC Qualifier: Diagnosis of  By: [name], [name]    Allergic rhinitis 09/16/2013   Allergy    Anemia    Anxiety    Anxiety disorder, unspecified 07/06/2017   Arthritis    Blood in stool    BMI 45.0-49.9, adult ([place]) 07/26/2016   Class 3 obesity with serious comorbidity and body mass index (BMI) of 45.0 to 49.9 in adult 03/07/2017   DDD (degenerative disc disease), lumbar    Diabetes mellitus without complication ([place])    DIVERTICULOSIS-COLON 12/28/2008   Qualifier: Diagnosis of  By: [name], [name]    GERD 12/28/2008   Qualifier: Diagnosis of  By: [name], Amy  S    GERD (gastroesophageal reflux disease)    Headache    Hyperglycemia 03/17/2014   Hypertension    Hypokalemia 07/26/2016   IBS (irritable bowel syndrome)-C-D 07/26/2016   Obesity    Other and unspecified hyperlipidemia 03/17/2014   PERSONAL HX COLONIC POLYPS 12/28/2008   Qualifier: Diagnosis of  By: [name], [name]    Reflux esophagitis 12/28/2008   Qualifier: Diagnosis of  By: [name], [name]     S/P lumbar spinal fusion 07/02/2012   Tubulovillous adenoma of colon 12/22/2006   Allergies  Allergen Reactions   Crestor [Rosuvastatin]    Erythromycin     rash   Jublia [Efinaconazole] Hives    Redness/burning around nail   Lansoprazole    Penicillins     rash   Potassium-Containing Compounds Other (See Comments)    Stomach upset, can take pills, but not the packets    Robaxin [Methocarbamol]     Messed up stomach   Sulfonamide Derivatives     rash   Tea Tree Oil     Redness, burning (localized)   Trental [Pentoxifylline Er]     Bad headache   Social History    Socioeconomic History   Marital status: Widowed    Spouse name: Not on file   Number of children: Not on file   Years of education: Not on file   Highest education level: Some college, no degree  Occupational History   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   Social Determinants of Health   Financial Resource Strain: Low Risk  (08/14/2022)   Overall Financial Resource Strain (CARDIA)    Difficulty of Paying Living Expenses: Not hard at all  Food Insecurity: No Food Insecurity (08/14/2022)   Hunger Vital Sign    Worried About Running Out of Food in the Last Year: Never true    Ran Out of Food in the Last Year: Never true  Transportation Needs: No Transportation Needs (08/14/2022)   PRAPARE - Hydrologist (Medical): No    Lack of Transportation (Non-Medical): No  Physical Activity: Insufficiently Active (08/11/2022)   Exercise Vital Sign    Days of Exercise per Week: 5 days    Minutes of Exercise per Session: 20 min  Stress: No Stress Concern Present (08/11/2022)   [place]    Feeling of Stress : Not at all  Social Connections: Moderately Integrated (08/11/2022)   Social Connection and Isolation Panel [NHANES]    Frequency of Communication with Friends and Family: More than three times a week    Frequency of Social Gatherings with Friends and Family:  More than three times a week    Attends Religious Services: More than 4 times per year    Active Member  of Clubs or Organizations: Yes    Attends Archivist Meetings: More than 4 times per year    Marital Status: Widowed   Vitals:   08/15/22 0919  BP: 128/80  Pulse: 68  Resp: 16  Temp: 98.5 F (36.9 C)  SpO2: 97%  Body mass index is 45.4 kg/m.  Physical Exam Vitals and nursing note reviewed.  Constitutional:      General: She is not in acute distress.    Appearance: She is well-developed. She is not ill-appearing.  HENT:     Head: Normocephalic and atraumatic.     Right Ear: Ear canal and external ear normal. A middle ear effusion is present. Tympanic membrane is not erythematous or bulging.     Left Ear: Tympanic membrane, ear canal and external ear normal.     Nose: Septal deviation and rhinorrhea present.     Right Turbinates: Enlarged.     Left Turbinates: Enlarged.     Right Sinus: Maxillary sinus tenderness (mild "pressure" sensation) present. No frontal sinus tenderness.     Left Sinus: No maxillary sinus tenderness or frontal sinus tenderness.  Eyes:     Conjunctiva/sclera: Conjunctivae normal.  Cardiovascular:     Rate and Rhythm: Normal rate. Rhythm irregular.     Heart sounds: No murmur heard. Pulmonary:     Effort: Pulmonary effort is normal. No respiratory distress.     Breath sounds: Normal breath sounds. No stridor.  Musculoskeletal:     Cervical back: No edema or erythema. No muscular tenderness.  Lymphadenopathy:     Cervical: No cervical adenopathy.  Skin:    General: Skin is warm.     Findings: No erythema or rash.  Neurological:     Mental Status: She is alert and oriented to person, place, and time.     Comments: Antalgic gait, assisted by a cane.  Psychiatric:        Mood and Affect: Affect normal. Mood is anxious.   ASSESSMENT AND PLAN:  Ms.Aleksia was seen today for ear pressure and fall.  Diagnoses and all orders for this  visit:  Fall in home, initial encounter I do not think imaging is needed today. Some of her comorbidities and medications can increase the risk for falls. We discussed fall precautions.  Need for influenza vaccination -     Flu Vaccine QUAD High Dose(Fluad)  Earache, right We discussed possible etiologies. Ear examination is not suggestive of a serious process. ?  Eustachian tube dysfunction. Recommend hyperinflation maneuvers a few times throughout the day. Monitor for new symptoms. Instructed about warning signs.  Seasonal allergic rhinitis due to pollen Symptoms have improved. I do not think there is an underlying bacterial process, so I am not recommending antibiotics. We could consider trial of doxycycline (allergy to penicillins).  Given her history of IBS diarrhea and constipation, which could be aggravating antibiotics, she prefers to hold on antibiotics. Flonase nasal spray every 2 to 3 days, it could aggravate nosebleed. Astelin nasal spray twice daily for 10 to 14 days and then as needed. Nasal saline irrigations as needed.  Wheezing  Not present today. Problem is not new and reported as stable. Could be related to allergies. COPD is also to be considered. She is not interested din Albuterol inh to use prn. Will re-evaluate next visit.  Return if symptoms worsen or fail to improve, for Keep next appt.  Evalyne Cortopassi G. [name], MD  [facility]. [place] office.

## 2022-08-15 ENCOUNTER — Ambulatory Visit: Payer: Medicare PPO | Admitting: Family Medicine

## 2022-08-15 ENCOUNTER — Encounter: Payer: Self-pay | Admitting: Family Medicine

## 2022-08-15 VITALS — BP 128/80 | HR 68 | Temp 98.5°F | Resp 16 | Ht 61.5 in | Wt 244.2 lb

## 2022-08-15 DIAGNOSIS — J301 Allergic rhinitis due to pollen: Secondary | ICD-10-CM

## 2022-08-15 DIAGNOSIS — R062 Wheezing: Secondary | ICD-10-CM

## 2022-08-15 DIAGNOSIS — W19XXXA Unspecified fall, initial encounter: Secondary | ICD-10-CM

## 2022-08-15 DIAGNOSIS — H9201 Otalgia, right ear: Secondary | ICD-10-CM | POA: Diagnosis not present

## 2022-08-15 DIAGNOSIS — Y92009 Unspecified place in unspecified non-institutional (private) residence as the place of occurrence of the external cause: Secondary | ICD-10-CM

## 2022-08-15 DIAGNOSIS — Z23 Encounter for immunization: Secondary | ICD-10-CM

## 2022-08-15 NOTE — Patient Instructions (Addendum)
A few things to remember from today's visit:   Fall in home, initial encounter  Need for influenza vaccination - Plan: Flu Vaccine QUAD High Dose(Fluad)  Earache, right  Seasonal allergic rhinitis due to pollen  Try to have your cane all the time with you. I do not think you have a sinus infection, if right sinus pain gets worse, we may need antibiotics. Astelin nasal spray 2 times daily for 10-14 days then as needed. Continue Flonase nasal spray every 2-3 days, can cause nose bleed. Start nasal saline irrigations.  If you need refills for medications you take chronically, please call your pharmacy. Do not use My Chart to request refills or for acute issues that need immediate attention. If you send a my chart message, it may take a few days to be addressed, specially if I am not in the office.  Please be sure medication list is accurate. If a new problem present, please set up appointment sooner than planned today.

## 2022-08-21 ENCOUNTER — Encounter: Payer: Self-pay | Admitting: Physical Therapy

## 2022-08-21 ENCOUNTER — Ambulatory Visit: Payer: Medicare PPO | Admitting: Physical Therapy

## 2022-08-21 DIAGNOSIS — R278 Other lack of coordination: Secondary | ICD-10-CM | POA: Diagnosis not present

## 2022-08-21 DIAGNOSIS — R159 Full incontinence of feces: Secondary | ICD-10-CM | POA: Diagnosis not present

## 2022-08-21 DIAGNOSIS — M6281 Muscle weakness (generalized): Secondary | ICD-10-CM

## 2022-08-21 DIAGNOSIS — R32 Unspecified urinary incontinence: Secondary | ICD-10-CM

## 2022-08-21 NOTE — Therapy (Signed)
OUTPATIENT PHYSICAL THERAPY TREATMENT NOTE   Patient Name: [name] MRN: [id number] DOB:Feb 03, 1951, 71 y.o., female Today's Date: 08/21/2022  PCP: [name], [name], MD  REFERRING PROVIDER:  Jaquita Folds, MD   END OF SESSION:   PT End of Session - 08/21/22 1452     Visit Number 4    Date for PT Re-Evaluation 10/06/22    Authorization Type humana    Authorization - Visit Number 4    Authorization - Number of Visits 12    Progress Note Due on Visit 10    PT Start Time 1400    PT Stop Time 9892    PT Time Calculation (min) 38 min    Activity Tolerance Patient tolerated treatment well    Behavior During Therapy WFL for tasks assessed/performed             Past Medical History:  Diagnosis Date   Abdominal pain, epigastric 12/28/2008   Centricity Description: ABDOMINAL PAIN, EPIGASTRIC Qualifier: Diagnosis of  By: [name], [name]   Centricity Description: ABDOMINAL PAIN-EPIGASTRIC Qualifier: Diagnosis of  By: [name], [name]    Allergic rhinitis 09/16/2013   Allergy    Anemia    Anxiety    Anxiety disorder, unspecified 07/06/2017   Arthritis    Blood in stool    BMI 45.0-49.9, adult ([place]) 07/26/2016   Class 3 obesity with serious comorbidity and body mass index (BMI) of 45.0 to 49.9 in adult 03/07/2017   DDD (degenerative disc disease), lumbar    Diabetes mellitus without complication ([place])    DIVERTICULOSIS-COLON 12/28/2008   Qualifier: Diagnosis of  By: [name], [name]    GERD 12/28/2008   Qualifier: Diagnosis of  By: [name], [name]    GERD (gastroesophageal reflux disease)    Headache    Hyperglycemia 03/17/2014   Hypertension    Hypokalemia 07/26/2016   IBS (irritable bowel syndrome)-C-D 07/26/2016   Obesity    Other and unspecified hyperlipidemia 03/17/2014   PERSONAL HX COLONIC POLYPS 12/28/2008   Qualifier: Diagnosis of  By: [name], [name]    Reflux esophagitis 12/28/2008   Qualifier: Diagnosis  of  By: [name], [name]     S/P lumbar spinal fusion 07/02/2012   Tubulovillous adenoma of colon 2008   Past Surgical History:  Procedure Laterality Date   ABDOMINAL HYSTERECTOMY     COLONOSCOPY  2014   laser surgery to remove scar     not sure of the year; after hyst   RADIOLOGY WITH ANESTHESIA N/A 02/25/2015   Procedure: MRI OF LUMBAR SPINE;  Surgeon: Medication Radiologist, MD;  Location: [facility];  Service: Radiology;  Laterality: N/A;  DR. Bennie Pierini COHEN/MRI   SPINE SURGERY     TONSILLECTOMY AND ADENOIDECTOMY     Patient Active Problem List   Diagnosis Date Noted   Rectal bleed 03/31/2022   Vaginal itching 09/27/2021   Atypical atrial flutter ([place]) 07/05/2021   Secondary hypercoagulable state ([place]) 06/06/2021   Atrial fibrillation ([place]) 04/16/2020   Symptomatic spider varicose vein 05/15/2018   Bradycardia, sinus 09/07/2017   Anxiety disorder, unspecified 07/06/2017   Hyperlipidemia associated with type 2 diabetes mellitus ([place]) 04/26/2017   Morbid obesity ([place]) 03/07/2017   Hypokalemia 07/26/2016   IBS (irritable bowel syndrome)-C-D 07/26/2016   Morbid obesity with BMI of 45.0-49.9, adult ([place]) 07/26/2016   Type 2 diabetes mellitus with other specified complication ([place]) 11/94/1740   Essential hypertension 03/17/2014   Other and unspecified hyperlipidemia  03/17/2014   Hyperglycemia 03/17/2014   Allergic rhinitis 09/16/2013   Arthritis 07/02/2012   S/P lumbar spinal fusion 07/02/2012   REFLUX ESOPHAGITIS 12/28/2008   GERD 12/28/2008   DIVERTICULOSIS-COLON 12/28/2008   Abdominal pain, epigastric 12/28/2008   PERSONAL HX COLONIC POLYPS 12/28/2008   REFERRING DIAG:  N32.81 (ICD-10-CM) - Overactive bladder  N39.3 (ICD-10-CM) - SUI (stress urinary incontinence, female)  R15.9 (ICD-10-CM) - Incontinence of feces, unspecified fecal incontinence type  M62.838 (ICD-10-CM) - Levator spasm      THERAPY DIAG:  Muscle weakness (generalized) - Plan: PT plan of care  cert/re-cert   Other lack of coordination - Plan: PT plan of care cert/re-cert   Incontinence of feces, unspecified fecal incontinence type - Plan: PT plan of care cert/re-cert   Urinary incontinence, unspecified type - Plan: PT plan of care cert/re-cert   Rationale for Evaluation and Treatment Rehabilitation   ONSET DATE: 2018   SUBJECTIVE:                                                                                                                                                                                            SUBJECTIVE STATEMENT: I had IBS over the weekend and on the commode so I am tired. I went to the doctor for my fall and everything was good. Left leg has Improved feeling but not the right. Still not able to feel the anus tighten.     PAIN:  Are you having pain? Yes: NPRS scale: 8/10 Pain location: right side of the body Pain description: constant Aggravating factors: movement Relieving factors: rest   PAIN:  Are you having pain? No   PRECAUTIONS: None   WEIGHT BEARING RESTRICTIONS No   FALLS:  Has patient fallen in last 6 months? No   LIVING ENVIRONMENT: Lives with: lives alone   OCCUPATION: retired   PLOF: Independent   PATIENT GOALS reduce the number of pads she uses   PERTINENT HISTORY:  A Fib; DM; chronic anticoagulation; bladder injury with her hysterectomy ;history of bladder instillations with DMSO ; IBS   BOWEL MOVEMENT Pain with bowel movement: No Type of bowel movement:Type (Bristol Stool Scale) Type 4 or Type 6, Frequency 1-4 times per day, Strain Yes, and Splinting yes Fully empty rectum: No Leakage: Yes: daily Pads: 4 pads and 3 depends daily   Fiber supplement: Yes: Miralx  for constipation,  Immodium for diarrhea   URINATION Pain with urination: No Fully empty bladder: No; push on her belly or vagina to empty bladder Stream: Weak Urgency: No Frequency: Day time voids- usually every few hours.  Nocturia: 3 times per night  to void Leakage: Urge to void, Walking to the bathroom, Coughing, Sneezing, Laughing, Lifting, and sit to stand Pads: 4 pads and 3 depends daily     PREGNANCY Vaginal deliveries 1    OBJECTIVE: (objective measures completed at initial evaluation unless otherwise dated)   (Copy Eval's Objective through Plan section here)   DIAGNOSTIC FINDINGS:  Pelvic floor strength I/V, puborectalis I/V external anal sphincter II/V; PVR of 31 ml was obtained by bladder scan.   Pelvic floor musculature: Right levator tender, Right obturator tender, Left levator tender, Left obturator tender   PATIENT SURVEYS:  PFIQ-7 159 ; UIQ -7 81; CRAIQ-7 81   COGNITION:            Overall cognitive status: Within functional limits for tasks assessed                          SENSATION:            Light touch: Appears intact            Proprioception: Appears intact     GAIT: Assistive device utilized: Single point cane Level of assistance: Complete Independence Comments: walks with small steps                POSTURE: No Significant postural limitations, rounded shoulders, forward head, and anterior pelvic tilt               PELVIC ALIGNMENT:correct   LUMBARAROM/PROM   A/PROM A/PROM  eval  Flexion    Extension    Right lateral flexion    Left lateral flexion    Right rotation    Left rotation     (Blank rows = not tested)   LOWER EXTREMITY ROM:   Passive ROM Right eval Left eval  Hip flexion      Hip extension      Hip abduction      Hip adduction      Hip internal rotation      Hip external rotation      Knee flexion      Knee extension      Ankle dorsiflexion      Ankle plantarflexion      Ankle inversion      Ankle eversion       (Blank rows = not tested)   LOWER EXTREMITY MMT:   MMT Right eval Left eval  Hip flexion 90 90  Hip extension      Hip abduction      Hip adduction      Hip internal rotation      Hip external rotation 40 40  Knee flexion      Knee  extension      Ankle dorsiflexion      Ankle plantarflexion      Ankle inversion      Ankle eversion         PALPATION:   General  Patient able to tighten her abdominals correctly                 External Perineal Exam stool came dripping out without control                             Internal Pelvic Floor No movement of the pelvic floor muscles, the anus was wide open    Patient confirms identification and approves PT to assess internal pelvic floor and treatment Yes  PELVIC MMT:   MMT eval  Vaginal 0/5  Internal Anal Sphincter 0/5  External Anal Sphincter 0/5  Puborectalis 0/5  (Blank rows = not tested)         TONE: increased   TODAY'S TREATMENT  08/21/2022 Neuromuscular re-education:  Pelvic floor contraction training( all exercises include pelvic floor and core engagement) :Mini squat with pelvic floor enagement 10x Sitting ball squeeze holding for 5 sec 10x  Walking sideways over orange bars with pelvic floor contraction 4 times Sitting pressing down on foam roll holding 5 sec with pelvic floor contraction 10x  Stepping over orange bars 4 times with pelvic floor contraction Sitting with ball squeeze knee extension 20x each side with pelvic floor contraction Stand holding onto pole bringing one foot onto step and alternate with pelvic floor contraction  Exercises: Strengthening:nustep for 5 minutes level 2 while assessing patient. due to feeling tired today.     08/14/2022   Neuromuscular re-education: Core facilitation:weight shifting side to side with pelvic floor contraction             Standing marching with contract pelvic floor and abdominals and not dropping the buttocks on the side the leg is lifting Mini squat with pelvic floor enagement 10x Sitting ball squeeze holding for 5 sec 10x  Sitting with ball squeeze knee extension 20x each side with pelvic floor contraction Sitting pressing down on foam roll holding 5 sec wiit pelvic floor contraction 10x   Walking sideways over orange bars with pelvic floor contraction 4 times Stepping over orange bars 4 times with pelvic floor contraction Strengthening: nustep for 6 minutes level 4 while assessing patient.  Self-care: Discussed with patient on timed voiding to go every 2 hours since she has trouble with sensation in the rectal area.     08/02/2022 Neuromuscular re-education: Pelvic floor contraction training:right sidely with pillow between knees squeezing it with pelvic floor contraction holding 5 sec 10x 2                                     Sidely press hands on thighs holding for 5 sec to engage the lower abdominal with pelvic floor 10 x 2                                     Sitting ball sqeeze hold 5 sec 10x 2                                     Sitting hip abduction with yellow band 10x2 Exercises: Strengthening: nustep level 3 for 5 mintues      PATIENT EDUCATION: 08/02/2022 Education details: Access Code: [email or web address] Person educated: Patient Education method: Explanation, Demonstration, Tactile cues, Verbal cues, and Handouts Education comprehension: verbalized understanding, returned demonstration, verbal cues required, tactile cues required, and needs further education     HOME EXERCISE PROGRAM: 08/02/2022 Access Code: [email or web address] URL: [email or web address] Date: 08/02/2022 Prepared by: Earlie Counts   Exercises - Supine Hip Adduction Isometric with Ball  - 1 x daily - 7 x weekly - 2 sets - 10 reps - Supine Transversus Abdominis Bracing - Hands on Thighs  - 1 x daily - 7 x weekly - 2 sets - 10 reps - 5 sec hold - Seated Hip  Adduction Squeeze with Ball  - 1 x daily - 7 x weekly - 2 sets - 10 reps - 5 sec hold - Seated Hip Abduction with Resistance  - 1 x daily - 7 x weekly - 2 sets - 10 reps None today   ASSESSMENT:   CLINICAL IMPRESSION: Patient is a 71 y.o. female who was seen today for physical therapy  treatment for overactive bladder, stress urinary  incontinence,fecal incontinence, and levator spasm. Patient has trouble lifting her left leg due to discomfort in the left buttocks. Patient has trouble with feeling the anus contract due to numbness and reduction of sensation. Patient is not able to feel the stool leakage due to lack of feeling. Patient feels her stool leakage is less but not sure how much.  Patient will benefit from skilled therapy to improve pelvic floor strength and coordination to reduce leakage.      OBJECTIVE IMPAIRMENTS decreased activity tolerance, decreased coordination, decreased endurance, decreased strength, increased fascial restrictions, and impaired tone.    ACTIVITY LIMITATIONS carrying, lifting, bending, standing, transfers, continence, and toileting   PARTICIPATION LIMITATIONS: meal prep, cleaning, shopping, and community activity   PERSONAL FACTORS Age, Fitness, Past/current experiences, Time since onset of injury/illness/exacerbation, and 3+ comorbidities: A Fib; DM; chronic anticoagulation; bladder injury with her hysterectomy ;history of bladder instillations with DMSO ; IBS  are also affecting patient's functional outcome.    REHAB POTENTIAL: Good   CLINICAL DECISION MAKING: Evolving/moderate complexity   EVALUATION COMPLEXITY: Moderate     GOALS: Goals reviewed with patient? Yes   SHORT TERM GOALS: Target date: 08/11/2022   Patient able to contract the pelvic floor to 1/5 due to improve coordination.  Baseline: Goal status: INITIAL   2.  Patient on a timed voiding program every 2 hours to reduce leakage.  Baseline:  Goal status: Met 08/14/2022   3.  Patient is able to start feeling stool leakage.  Baseline: not able to due to feeling numb on the buttocks Goal status: INITIAL     LONG TERM GOALS: Target date: 10/06/2022    Patient independent with her HEP for pelvic floor strengthening.  Baseline:  Goal status: INITIAL   2.  Patient is able to reduce her pad usage to 1-2 per day due to  increased In pelvic floor strength >/= 3/5.  Baseline:  Goal status: INITIAL   3.  Patient anus is able to hug therapist finger due to improved pelvic floor contraction and reduction of fecal leakage.  Baseline:  Goal status: INITIAL   4.  PFIQ-7 decreased </= 80 from 159 due to improved leakage and reduction of her frustration with the leakage.  Baseline:  Goal status: INITIAL   5.  Patient went from sit to stand with urinary leakage decreased >/= 50%.  Baseline:  Goal status: INITIAL     PLAN: PT FREQUENCY: 1x/week   PT DURATION: 12 weeks   PLANNED INTERVENTIONS: Therapeutic exercises, Therapeutic activity, Neuromuscular re-education, Patient/Family education, Self Care, Biofeedback, and Manual therapy   PLAN FOR NEXT SESSION: work on pelvic floor contraction,  see what the doctor says. Standing exercises sit to stand, see if urinary leakage is less with sit to stand.   Earlie Counts, PT 08/21/22 3:25 PM

## 2022-08-21 NOTE — Progress Notes (Signed)
Appointment cancelled - please disregard. 

## 2022-08-28 ENCOUNTER — Encounter: Payer: Self-pay | Admitting: Physical Therapy

## 2022-08-28 ENCOUNTER — Ambulatory Visit: Payer: Medicare PPO | Admitting: Physical Therapy

## 2022-08-28 ENCOUNTER — Other Ambulatory Visit: Payer: Self-pay | Admitting: Family Medicine

## 2022-08-28 DIAGNOSIS — M6281 Muscle weakness (generalized): Secondary | ICD-10-CM

## 2022-08-28 DIAGNOSIS — R159 Full incontinence of feces: Secondary | ICD-10-CM | POA: Diagnosis not present

## 2022-08-28 DIAGNOSIS — I10 Essential (primary) hypertension: Secondary | ICD-10-CM

## 2022-08-28 DIAGNOSIS — R278 Other lack of coordination: Secondary | ICD-10-CM | POA: Diagnosis not present

## 2022-08-28 DIAGNOSIS — R32 Unspecified urinary incontinence: Secondary | ICD-10-CM | POA: Diagnosis not present

## 2022-08-28 NOTE — Therapy (Signed)
OUTPATIENT PHYSICAL THERAPY TREATMENT NOTE   Patient Name: [name] MRN: [id number] DOB:05-03-1951, 71 y.o., female Today's Date: 08/28/2022  PCP: [name], [name], MD   REFERRING PROVIDER: Jaquita Folds, MD    END OF SESSION:   PT End of Session - 08/28/22 1243     Visit Number 5    Date for PT Re-Evaluation 10/06/22    Authorization Type humana    Authorization - Visit Number 5    Authorization - Number of Visits 12    Progress Note Due on Visit 10    PT Start Time 0938    PT Stop Time 1829    PT Time Calculation (min) 38 min    Activity Tolerance Patient tolerated treatment well    Behavior During Therapy WFL for tasks assessed/performed             Past Medical History:  Diagnosis Date   Abdominal pain, epigastric 12/28/2008   Centricity Description: ABDOMINAL PAIN, EPIGASTRIC Qualifier: Diagnosis of  By: [name], [name]   Centricity Description: ABDOMINAL PAIN-EPIGASTRIC Qualifier: Diagnosis of  By: [name], [name]    Allergic rhinitis 09/16/2013   Allergy    Anemia    Anxiety    Anxiety disorder, unspecified 07/06/2017   Arthritis    Blood in stool    BMI 45.0-49.9, adult ([place]) 07/26/2016   Class 3 obesity with serious comorbidity and body mass index (BMI) of 45.0 to 49.9 in adult 03/07/2017   DDD (degenerative disc disease), lumbar    Diabetes mellitus without complication ([place])    DIVERTICULOSIS-COLON 12/28/2008   Qualifier: Diagnosis of  By: [name], [name]    GERD 12/28/2008   Qualifier: Diagnosis of  By: [name], [name]    GERD (gastroesophageal reflux disease)    Headache    Hyperglycemia 03/17/2014   Hypertension    Hypokalemia 07/26/2016   IBS (irritable bowel syndrome)-C-D 07/26/2016   Obesity    Other and unspecified hyperlipidemia 03/17/2014   PERSONAL HX COLONIC POLYPS 12/28/2008   Qualifier: Diagnosis of  By: [name], [name]    Reflux esophagitis 12/28/2008   Qualifier: Diagnosis  of  By: [name], [name]     S/P lumbar spinal fusion 07/02/2012   Tubulovillous adenoma of colon 2008   Past Surgical History:  Procedure Laterality Date   ABDOMINAL HYSTERECTOMY     COLONOSCOPY  2014   laser surgery to remove scar     not sure of the year; after hyst   RADIOLOGY WITH ANESTHESIA N/A 02/25/2015   Procedure: MRI OF LUMBAR SPINE;  Surgeon: Medication Radiologist, MD;  Location: [facility];  Service: Radiology;  Laterality: N/A;  DR. Bennie Pierini COHEN/MRI   SPINE SURGERY     TONSILLECTOMY AND ADENOIDECTOMY     Patient Active Problem List   Diagnosis Date Noted   Rectal bleed 03/31/2022   Vaginal itching 09/27/2021   Atypical atrial flutter ([place]) 07/05/2021   Secondary hypercoagulable state ([place]) 06/06/2021   Atrial fibrillation ([place]) 04/16/2020   Symptomatic spider varicose vein 05/15/2018   Bradycardia, sinus 09/07/2017   Anxiety disorder, unspecified 07/06/2017   Hyperlipidemia associated with type 2 diabetes mellitus ([place]) 04/26/2017   Morbid obesity ([place]) 03/07/2017   Hypokalemia 07/26/2016   IBS (irritable bowel syndrome)-C-D 07/26/2016   Morbid obesity with BMI of 45.0-49.9, adult ([place]) 07/26/2016   Type 2 diabetes mellitus with other specified complication ([place]) 93/71/6967   Essential hypertension 03/17/2014   Other and unspecified  hyperlipidemia 03/17/2014   Hyperglycemia 03/17/2014   Allergic rhinitis 09/16/2013   Arthritis 07/02/2012   S/P lumbar spinal fusion 07/02/2012   REFLUX ESOPHAGITIS 12/28/2008   GERD 12/28/2008   DIVERTICULOSIS-COLON 12/28/2008   Abdominal pain, epigastric 12/28/2008   PERSONAL HX COLONIC POLYPS 12/28/2008   REFERRING DIAG:  N32.81 (ICD-10-CM) - Overactive bladder  N39.3 (ICD-10-CM) - SUI (stress urinary incontinence, female)  R15.9 (ICD-10-CM) - Incontinence of feces, unspecified fecal incontinence type  M62.838 (ICD-10-CM) - Levator spasm      THERAPY DIAG:  Muscle weakness (generalized) - Plan: PT plan of care  cert/re-cert   Other lack of coordination - Plan: PT plan of care cert/re-cert   Incontinence of feces, unspecified fecal incontinence type - Plan: PT plan of care cert/re-cert   Urinary incontinence, unspecified type - Plan: PT plan of care cert/re-cert   Rationale for Evaluation and Treatment Rehabilitation   ONSET DATE: 2018   SUBJECTIVE:                                                                                                                                                                                            SUBJECTIVE STATEMENT: I had IBS over the weekend and on the commode so I am tired. I went to the doctor for my fall and everything was good. Left leg has Improved feeling but not the right. Still not able to feel the anus tighten.     PAIN:  Are you having pain? Yes: NPRS scale: 8/10 Pain location: right side of the body Pain description: constant Aggravating factors: movement Relieving factors: rest   PAIN:  Are you having pain? No   PRECAUTIONS: None   WEIGHT BEARING RESTRICTIONS No   FALLS:  Has patient fallen in last 6 months? No   LIVING ENVIRONMENT: Lives with: lives alone   OCCUPATION: retired   PLOF: Independent   PATIENT GOALS reduce the number of pads she uses   PERTINENT HISTORY:  A Fib; DM; chronic anticoagulation; bladder injury with her hysterectomy ;history of bladder instillations with DMSO ; IBS   BOWEL MOVEMENT Pain with bowel movement: No Type of bowel movement:Type (Bristol Stool Scale) Type 4 or Type 6, Frequency 1-4 times per day, Strain Yes, and Splinting yes Fully empty rectum: No Leakage: Yes: daily Pads: 4 pads and 3 depends daily   Fiber supplement: Yes: Miralx  for constipation,  Immodium for diarrhea   URINATION Pain with urination: No Fully empty bladder: No; push on her belly or vagina to empty bladder Stream: Weak Urgency: No Frequency: Day time voids- usually every few hours.  Nocturia: 3 times per night  to void Leakage: Urge to void, Walking to the bathroom, Coughing, Sneezing, Laughing, Lifting, and sit to stand Pads: 4 pads and 3 depends daily     PREGNANCY Vaginal deliveries 1    OBJECTIVE: (objective measures completed at initial evaluation unless otherwise dated)     (Copy Eval's Objective through Plan section here)   DIAGNOSTIC FINDINGS:  Pelvic floor strength I/V, puborectalis I/V external anal sphincter II/V; PVR of 31 ml was obtained by bladder scan.   Pelvic floor musculature: Right levator tender, Right obturator tender, Left levator tender, Left obturator tender   PATIENT SURVEYS:  PFIQ-7 159 ; UIQ -7 81; CRAIQ-7 81   COGNITION:            Overall cognitive status: Within functional limits for tasks assessed                          SENSATION:            Light touch: Appears intact            Proprioception: Appears intact     GAIT: Assistive device utilized: Single point cane Level of assistance: Complete Independence Comments: walks with small steps                POSTURE: No Significant postural limitations, rounded shoulders, forward head, and anterior pelvic tilt               PELVIC ALIGNMENT:correct   LUMBARAROM/PROM   A/PROM A/PROM  eval  Flexion    Extension    Right lateral flexion    Left lateral flexion    Right rotation    Left rotation     (Blank rows = not tested)   LOWER EXTREMITY ROM:   Passive ROM Right eval Left eval  Hip flexion      Hip extension      Hip abduction      Hip adduction      Hip internal rotation      Hip external rotation      Knee flexion      Knee extension      Ankle dorsiflexion      Ankle plantarflexion      Ankle inversion      Ankle eversion       (Blank rows = not tested)   LOWER EXTREMITY MMT:   MMT Right eval Left eval  Hip flexion 90 90  Hip extension      Hip abduction      Hip adduction      Hip internal rotation      Hip external rotation 40 40  Knee flexion      Knee  extension      Ankle dorsiflexion      Ankle plantarflexion      Ankle inversion      Ankle eversion         PALPATION:   General  Patient able to tighten her abdominals correctly                 External Perineal Exam stool came dripping out without control                             Internal Pelvic Floor No movement of the pelvic floor muscles, the anus was wide open    Patient confirms identification and approves PT to assess internal pelvic floor and  treatment Yes   PELVIC MMT:   MMT eval  Vaginal 0/5  Internal Anal Sphincter 0/5  External Anal Sphincter 0/5  Puborectalis 0/5  (Blank rows = not tested)         TONE: increased   TODAY'S TREATMENT  08/28/2022 Manual: Soft tissue mobilization: Scar tissue mobilization: Myofascial release: Spinal mobilization: Internal pelvic floor techniques: Dry needling: Neuromuscular re-education: Core retraining: Core facilitation: Form correction: Pelvic floor contraction training:( all exercises include pelvic floor and core engagement) :Mini squat with pelvic floor enagement 10x Sitting ball squeeze holding for 5 sec 10x while squeeze ball between your hands Walking sideways over orange bars with pelvic floor contraction 4 times Sitting pressing down on foam roll holding 5 sec with pelvic floor contraction 10x  Stepping over orange bars 4 times with pelvic floor contraction Sitting with ball squeeze knee extension 20x each side with pelvic floor contraction with 5# on each ankle Stand holding onto pole bringing one foot onto step and alternate with pelvic floor contraction Down training: Exercises: Stretches/mobility: Strengthening: Nustep for 10 minutes level 4 while assessing patient Therapeutic activities: Functional strengthening activities: Self-care:     08/21/2022 Neuromuscular re-education:   Pelvic floor contraction training( all exercises include pelvic floor and core engagement) :Mini squat with  pelvic floor enagement 10x Sitting ball squeeze holding for 5 sec 10x  Walking sideways over orange bars with pelvic floor contraction 4 times Sitting pressing down on foam roll holding 5 sec with pelvic floor contraction 10x  Stepping over orange bars 4 times with pelvic floor contraction Sitting with ball squeeze knee extension 20x each side with pelvic floor contraction Stand holding onto pole bringing one foot onto step and alternate with pelvic floor contraction   Exercises: Strengthening:nustep for 5 minutes level 2 while assessing patient. due to feeling tired today.      08/14/2022   Neuromuscular re-education: Core facilitation:weight shifting side to side with pelvic floor contraction             Standing marching with contract pelvic floor and abdominals and not dropping the buttocks on the side the leg is lifting Mini squat with pelvic floor enagement 10x Sitting ball squeeze holding for 5 sec 10x  Sitting with ball squeeze knee extension 20x each side with pelvic floor contraction Sitting pressing down on foam roll holding 5 sec wiit pelvic floor contraction 10x  Walking sideways over orange bars with pelvic floor contraction 4 times Stepping over orange bars 4 times with pelvic floor contraction Strengthening: nustep for 6 minutes level 4 while assessing patient.  Self-care: Discussed with patient on timed voiding to go every 2 hours since she has trouble with sensation in the rectal area.       PATIENT EDUCATION: 08/02/2022 Education details: Access Code: [email or web address] Person educated: Patient Education method: Explanation, Demonstration, Tactile cues, Verbal cues, and Handouts Education comprehension: verbalized understanding, returned demonstration, verbal cues required, tactile cues required, and needs further education     HOME EXERCISE PROGRAM: 08/02/2022 Access Code: [email or web address] URL: [email or web address] Date: 08/02/2022 Prepared by: Earlie Counts    Exercises - Supine Hip Adduction Isometric with Ball  - 1 x daily - 7 x weekly - 2 sets - 10 reps - Supine Transversus Abdominis Bracing - Hands on Thighs  - 1 x daily - 7 x weekly - 2 sets - 10 reps - 5 sec hold - Seated Hip Adduction Squeeze with Ball  - 1 x daily - 7 x weekly - 2 sets -  10 reps - 5 sec hold - Seated Hip Abduction with Resistance  - 1 x daily - 7 x weekly - 2 sets - 10 reps None today   ASSESSMENT:   CLINICAL IMPRESSION: Patient is a 71 y.o. female who was seen today for physical therapy  treatment for overactive bladder, stress urinary incontinence,fecal incontinence, and levator spasm. Patient still has trouble with feeling the anus contract due to numbness and reduction of sensation. Patient is not able to feel the stool leakage due to lack of feeling. Patient goes to the bathroom every 2 hours at night. During the day she is able to urinate without pushing the urine out and empty her bladder well.  Patient able to use weights with knee extension. Patient will benefit from skilled therapy to improve pelvic floor strength and coordination to reduce leakage.      OBJECTIVE IMPAIRMENTS decreased activity tolerance, decreased coordination, decreased endurance, decreased strength, increased fascial restrictions, and impaired tone.    ACTIVITY LIMITATIONS carrying, lifting, bending, standing, transfers, continence, and toileting   PARTICIPATION LIMITATIONS: meal prep, cleaning, shopping, and community activity   PERSONAL FACTORS Age, Fitness, Past/current experiences, Time since onset of injury/illness/exacerbation, and 3+ comorbidities: A Fib; DM; chronic anticoagulation; bladder injury with her hysterectomy ;history of bladder instillations with DMSO ; IBS  are also affecting patient's functional outcome.    REHAB POTENTIAL: Good   CLINICAL DECISION MAKING: Evolving/moderate complexity   EVALUATION COMPLEXITY: Moderate     GOALS: Goals reviewed with patient? Yes    SHORT TERM GOALS: Target date: 08/11/2022   Patient able to contract the pelvic floor to 1/5 due to improve coordination.  Baseline: Goal status:ongoing 08/28/2022   2.  Patient on a timed voiding program every 2 hours to reduce leakage.  Baseline:  Goal status: Met 08/14/2022   3.  Patient is able to start feeling stool leakage.  Baseline: not able to due to feeling numb on the buttocks Goal status: ongoing 08/28/2022     LONG TERM GOALS: Target date: 10/06/2022    Patient independent with her HEP for pelvic floor strengthening.  Baseline:  Goal status: ongoing 08/28/2022   2.  Patient is able to reduce her pad usage to 1-2 per day due to increased In pelvic floor strength >/= 3/5.  Baseline:  Goal status: ongoing 08/28/2022   3.  Patient anus is able to hug therapist finger due to improved pelvic floor contraction and reduction of fecal leakage.  Baseline:  Goal status: INITIAL   4.  PFIQ-7 decreased </= 80 from 159 due to improved leakage and reduction of her frustration with the leakage.  Baseline:  Goal status: INITIAL   5.  Patient went from sit to stand with urinary leakage decreased >/= 50%.  Baseline:  Goal status: ongoing 08/28/2022     PLAN: PT FREQUENCY: 1x/week   PT DURATION: 12 weeks   PLANNED INTERVENTIONS: Therapeutic exercises, Therapeutic activity, Neuromuscular re-education, Patient/Family education, Self Care, Biofeedback, and Manual therapy   PLAN FOR NEXT SESSION: work on pelvic floor contraction,  Standing exercises sit to stand, see if urinary leakage is less with sit to stand.     Earlie Counts, PT 08/28/22 1:20 PM

## 2022-08-29 DIAGNOSIS — Z1231 Encounter for screening mammogram for malignant neoplasm of breast: Secondary | ICD-10-CM | POA: Diagnosis not present

## 2022-08-29 LAB — HM MAMMOGRAPHY

## 2022-08-30 ENCOUNTER — Encounter: Payer: Self-pay | Admitting: Family Medicine

## 2022-09-06 ENCOUNTER — Ambulatory Visit: Payer: Medicare PPO | Admitting: Physical Therapy

## 2022-09-06 ENCOUNTER — Encounter: Payer: Self-pay | Admitting: Physical Therapy

## 2022-09-06 DIAGNOSIS — R159 Full incontinence of feces: Secondary | ICD-10-CM | POA: Diagnosis not present

## 2022-09-06 DIAGNOSIS — M6281 Muscle weakness (generalized): Secondary | ICD-10-CM

## 2022-09-06 DIAGNOSIS — R32 Unspecified urinary incontinence: Secondary | ICD-10-CM | POA: Diagnosis not present

## 2022-09-06 DIAGNOSIS — R278 Other lack of coordination: Secondary | ICD-10-CM | POA: Diagnosis not present

## 2022-09-06 NOTE — Therapy (Signed)
OUTPATIENT PHYSICAL THERAPY TREATMENT NOTE   Patient Name: [name] MRN: [id number] DOB:October 28, 1950, 71 y.o., female Today's Date: 09/06/2022  PCP: [name], [name], MD  REFERRING PROVIDER: Jaquita Folds, MD   END OF SESSION:   PT End of Session - 09/06/22 1242     Visit Number 6    Date for PT Re-Evaluation 10/06/22    Authorization Type humana    Authorization - Visit Number 6    Authorization - Number of Visits 12    Progress Note Due on Visit 10    PT Start Time 1237    PT Stop Time 9417    PT Time Calculation (min) 38 min    Activity Tolerance Patient tolerated treatment well    Behavior During Therapy WFL for tasks assessed/performed             Past Medical History:  Diagnosis Date   Abdominal pain, epigastric 12/28/2008   Centricity Description: ABDOMINAL PAIN, EPIGASTRIC Qualifier: Diagnosis of  By: [name], [name]   Centricity Description: ABDOMINAL PAIN-EPIGASTRIC Qualifier: Diagnosis of  By: [name], [name]    Allergic rhinitis 09/16/2013   Allergy    Anemia    Anxiety    Anxiety disorder, unspecified 07/06/2017   Arthritis    Blood in stool    BMI 45.0-49.9, adult ([place]) 07/26/2016   Class 3 obesity with serious comorbidity and body mass index (BMI) of 45.0 to 49.9 in adult 03/07/2017   DDD (degenerative disc disease), lumbar    Diabetes mellitus without complication ([place])    DIVERTICULOSIS-COLON 12/28/2008   Qualifier: Diagnosis of  By: [name], [name]    GERD 12/28/2008   Qualifier: Diagnosis of  By: [name], [name]    GERD (gastroesophageal reflux disease)    Headache    Hyperglycemia 03/17/2014   Hypertension    Hypokalemia 07/26/2016   IBS (irritable bowel syndrome)-C-D 07/26/2016   Obesity    Other and unspecified hyperlipidemia 03/17/2014   PERSONAL HX COLONIC POLYPS 12/28/2008   Qualifier: Diagnosis of  By: [name], [name]    Reflux esophagitis 12/28/2008   Qualifier: Diagnosis  of  By: [name], [name]     S/P lumbar spinal fusion 07/02/2012   Tubulovillous adenoma of colon 2008   Past Surgical History:  Procedure Laterality Date   ABDOMINAL HYSTERECTOMY     COLONOSCOPY  2014   laser surgery to remove scar     not sure of the year; after hyst   RADIOLOGY WITH ANESTHESIA N/A 02/25/2015   Procedure: MRI OF LUMBAR SPINE;  Surgeon: Medication Radiologist, MD;  Location: [facility];  Service: Radiology;  Laterality: N/A;  DR. Bennie Pierini COHEN/MRI   SPINE SURGERY     TONSILLECTOMY AND ADENOIDECTOMY     Patient Active Problem List   Diagnosis Date Noted   Rectal bleed 03/31/2022   Vaginal itching 09/27/2021   Atypical atrial flutter ([place]) 07/05/2021   Secondary hypercoagulable state ([place]) 06/06/2021   Atrial fibrillation ([place]) 04/16/2020   Symptomatic spider varicose vein 05/15/2018   Bradycardia, sinus 09/07/2017   Anxiety disorder, unspecified 07/06/2017   Hyperlipidemia associated with type 2 diabetes mellitus ([place]) 04/26/2017   Morbid obesity ([place]) 03/07/2017   Hypokalemia 07/26/2016   IBS (irritable bowel syndrome)-C-D 07/26/2016   Morbid obesity with BMI of 45.0-49.9, adult ([place]) 07/26/2016   Type 2 diabetes mellitus with other specified complication ([place]) 40/81/4481   Essential hypertension 03/17/2014   Other and unspecified hyperlipidemia 03/17/2014  Hyperglycemia 03/17/2014   Allergic rhinitis 09/16/2013   Arthritis 07/02/2012   S/P lumbar spinal fusion 07/02/2012   REFLUX ESOPHAGITIS 12/28/2008   GERD 12/28/2008   DIVERTICULOSIS-COLON 12/28/2008   Abdominal pain, epigastric 12/28/2008   PERSONAL HX COLONIC POLYPS 12/28/2008   REFERRING DIAG:  N32.81 (ICD-10-CM) - Overactive bladder  N39.3 (ICD-10-CM) - SUI (stress urinary incontinence, female)  R15.9 (ICD-10-CM) - Incontinence of feces, unspecified fecal incontinence type  M62.838 (ICD-10-CM) - Levator spasm      THERAPY DIAG:  Muscle weakness (generalized) - Plan: PT plan of care  cert/re-cert   Other lack of coordination - Plan: PT plan of care cert/re-cert   Incontinence of feces, unspecified fecal incontinence type - Plan: PT plan of care cert/re-cert   Urinary incontinence, unspecified type - Plan: PT plan of care cert/re-cert   Rationale for Evaluation and Treatment Rehabilitation   ONSET DATE: 2018   SUBJECTIVE:                                                                                                                                                                                            SUBJECTIVE STATEMENT: I twisted my right foot when walking and buttocks and feet got cold.  Right now I am constipated. Patient reports she has a small urinary leakage instead of a flood with sit to stand. She can wear a light pad instead of a heavy pad.   PAIN:  Are you having pain? Yes: NPRS scale: 8/10 Pain location: right side of the body Pain description: constant Aggravating factors: movement Relieving factors: rest   PAIN:  Are you having pain? No   PRECAUTIONS: None   WEIGHT BEARING RESTRICTIONS No   FALLS:  Has patient fallen in last 6 months? No   LIVING ENVIRONMENT: Lives with: lives alone   OCCUPATION: retired   PLOF: Independent   PATIENT GOALS reduce the number of pads she uses   PERTINENT HISTORY:  A Fib; DM; chronic anticoagulation; bladder injury with her hysterectomy ;history of bladder instillations with DMSO ; IBS   BOWEL MOVEMENT Pain with bowel movement: No Type of bowel movement:Type (Bristol Stool Scale) Type 4 or Type 6, Frequency 1-4 times per day, Strain Yes, and Splinting yes Fully empty rectum: No Leakage: Yes: daily Pads: 4 pads and 3 depends daily   Fiber supplement: Yes: Miralx  for constipation,  Immodium for diarrhea   URINATION Pain with urination: No Fully empty bladder: No; push on her belly or vagina to empty bladder Stream: Weak Urgency: No Frequency: Day time voids- usually every few hours.   Nocturia: 3 times per night to void  Leakage: Urge to void, Walking to the bathroom, Coughing, Sneezing, Laughing, Lifting, and sit to stand Pads: 4 pads and 3 depends daily     PREGNANCY Vaginal deliveries 1    OBJECTIVE: (objective measures completed at initial evaluation unless otherwise dated)     (Copy Eval's Objective through Plan section here)   DIAGNOSTIC FINDINGS:  Pelvic floor strength I/V, puborectalis I/V external anal sphincter II/V; PVR of 31 ml was obtained by bladder scan.   Pelvic floor musculature: Right levator tender, Right obturator tender, Left levator tender, Left obturator tender   PATIENT SURVEYS:  PFIQ-7 159 ; UIQ -7 81; CRAIQ-7 81   COGNITION:            Overall cognitive status: Within functional limits for tasks assessed                          SENSATION:            Light touch: Appears intact            Proprioception: Appears intact     GAIT: Assistive device utilized: Single point cane Level of assistance: Complete Independence Comments: walks with small steps                POSTURE: No Significant postural limitations, rounded shoulders, forward head, and anterior pelvic tilt               PELVIC ALIGNMENT:correct     LOWER EXTREMITY MMT:   MMT Right eval Left eval  Hip flexion 90 90  Hip external rotation 40 40     PALPATION:   General  Patient able to tighten her abdominals correctly                 External Perineal Exam stool came dripping out without control                             Internal Pelvic Floor No movement of the pelvic floor muscles, the anus was wide open    Patient confirms identification and approves PT to assess internal pelvic floor and treatment Yes   PELVIC MMT:   MMT eval  Vaginal 0/5  Internal Anal Sphincter 0/5  External Anal Sphincter 0/5  Puborectalis 0/5  (Blank rows = not tested)         TONE: increased   TODAY'S TREATMENT  09/06/2022  Exercises: Strengthening: Nustep level 3  for 10 min while assessing patient Sitting with ball squeeze bilateral elbow flexion holding 2# in each hand 20x Sitting with ball squeeze bilateral shoulder flexion holding 2# in each hand 10x 2  Sitting with ball squeeze knee extension 20x each side with pelvic floor contraction with 5# on each ankle Marching in sitting with 5# weight on the ankles and squeeze ball between hands.     08/28/2022 Neuromuscular re-education: Pelvic floor contraction training:( all exercises include pelvic floor and core engagement) :Mini squat with pelvic floor enagement 10x Sitting ball squeeze holding for 5 sec 10x while squeeze ball between your hands Walking sideways over orange bars with pelvic floor contraction 4 times Sitting pressing down on foam roll holding 5 sec with pelvic floor contraction 10x  Stepping over orange bars 4 times with pelvic floor contraction Sitting with ball squeeze knee extension 20x each side with pelvic floor contraction with 5# on each ankle Stand holding onto pole bringing one foot onto step  and alternate with pelvic floor contraction Exercises: Strengthening: Nustep for 10 minutes level 4 while assessing patient     08/21/2022 Neuromuscular re-education:   Pelvic floor contraction training( all exercises include pelvic floor and core engagement) :Mini squat with pelvic floor enagement 10x Sitting ball squeeze holding for 5 sec 10x  Walking sideways over orange bars with pelvic floor contraction 4 times Sitting pressing down on foam roll holding 5 sec with pelvic floor contraction 10x  Stepping over orange bars 4 times with pelvic floor contraction Sitting with ball squeeze knee extension 20x each side with pelvic floor contraction Stand holding onto pole bringing one foot onto step and alternate with pelvic floor contraction   Exercises: Strengthening:nustep for 5 minutes level 2 while assessing patient. due to feeling tired today.       PATIENT  EDUCATION: 08/02/2022 Education details: Access Code: [email or web address] Person educated: Patient Education method: Explanation, Demonstration, Tactile cues, Verbal cues, and Handouts Education comprehension: verbalized understanding, returned demonstration, verbal cues required, tactile cues required, and needs further education     HOME EXERCISE PROGRAM: 08/02/2022 Access Code: [email or web address] URL: [email or web address] Date: 08/02/2022 Prepared by: Earlie Counts   Exercises - Supine Hip Adduction Isometric with Ball  - 1 x daily - 7 x weekly - 2 sets - 10 reps - Supine Transversus Abdominis Bracing - Hands on Thighs  - 1 x daily - 7 x weekly - 2 sets - 10 reps - 5 sec hold - Seated Hip Adduction Squeeze with Ball  - 1 x daily - 7 x weekly - 2 sets - 10 reps - 5 sec hold - Seated Hip Abduction with Resistance  - 1 x daily - 7 x weekly - 2 sets - 10 reps None today   ASSESSMENT:   CLINICAL IMPRESSION: Patient is a 71 y.o. female who was seen today for physical therapy  treatment for overactive bladder, stress urinary incontinence,fecal incontinence, and levator spasm. Patient still has trouble with feeling the anus contract due to numbness and reduction of sensation. Patient will leak a little instead of a flood when going from sit to stand. Patient is wearing a light pad instead of a heavy pad. Patient reports 80% improvement since initial evaluation. She does not have to strain to use the bathroom. Patient not able to do standing exercises due to her hurting her right foot and toe nail is almost coming off. Patient will benefit from skilled therapy to improve pelvic floor strength and coordination to reduce leakage.      OBJECTIVE IMPAIRMENTS decreased activity tolerance, decreased coordination, decreased endurance, decreased strength, increased fascial restrictions, and impaired tone.    ACTIVITY LIMITATIONS carrying, lifting, bending, standing, transfers, continence, and toileting    PARTICIPATION LIMITATIONS: meal prep, cleaning, shopping, and community activity   PERSONAL FACTORS Age, Fitness, Past/current experiences, Time since onset of injury/illness/exacerbation, and 3+ comorbidities: A Fib; DM; chronic anticoagulation; bladder injury with her hysterectomy ;history of bladder instillations with DMSO ; IBS  are also affecting patient's functional outcome.    REHAB POTENTIAL: Good   CLINICAL DECISION MAKING: Evolving/moderate complexity   EVALUATION COMPLEXITY: Moderate     GOALS: Goals reviewed with patient? Yes   SHORT TERM GOALS: Target date: 08/11/2022   Patient able to contract the pelvic floor to 1/5 due to improve coordination.  Baseline: Goal status:ongoing 08/28/2022   2.  Patient on a timed voiding program every 2 hours to reduce leakage.  Baseline:  Goal status: Met 08/14/2022   3.  Patient  is able to start feeling stool leakage.  Baseline: not able to due to feeling numb on the buttocks Goal status: ongoing 08/28/2022     LONG TERM GOALS: Target date: 10/06/2022    Patient independent with her HEP for pelvic floor strengthening.  Baseline:  Goal status: ongoing 08/28/2022   2.  Patient is able to reduce her pad usage to 1-2 per day due to increased In pelvic floor strength >/= 3/5.  Baseline: wears 3 light day pads instead of heavy pads during the day.  Goal status: ongoing 08/28/2022   3.  Patient anus is able to hug therapist finger due to improved pelvic floor contraction and reduction of fecal leakage.  Baseline:  Goal status: INITIAL   4.  PFIQ-7 decreased </= 80 from 159 due to improved leakage and reduction of her frustration with the leakage.  Baseline:  Goal status: INITIAL   5.  Patient went from sit to stand with urinary leakage decreased >/= 50%.  Baseline:  Goal status: Met 09/06/2022     PLAN: PT FREQUENCY: 1x/week   PT DURATION: 12 weeks   PLANNED INTERVENTIONS: Therapeutic exercises, Therapeutic activity,  Neuromuscular re-education, Patient/Family education, Self Care, Biofeedback, and Manual therapy   PLAN FOR NEXT SESSION: work on pelvic floor contraction,  Standing exercises sit to stand   Earlie Counts, PT 09/06/22 1:14 PM

## 2022-09-09 ENCOUNTER — Ambulatory Visit (INDEPENDENT_AMBULATORY_CARE_PROVIDER_SITE_OTHER): Payer: Medicare PPO

## 2022-09-09 ENCOUNTER — Ambulatory Visit: Payer: Medicare PPO | Admitting: Podiatry

## 2022-09-09 DIAGNOSIS — B351 Tinea unguium: Secondary | ICD-10-CM | POA: Diagnosis not present

## 2022-09-09 DIAGNOSIS — Z7901 Long term (current) use of anticoagulants: Secondary | ICD-10-CM

## 2022-09-09 DIAGNOSIS — M79671 Pain in right foot: Secondary | ICD-10-CM

## 2022-09-09 DIAGNOSIS — M79675 Pain in left toe(s): Secondary | ICD-10-CM | POA: Diagnosis not present

## 2022-09-09 DIAGNOSIS — L600 Ingrowing nail: Secondary | ICD-10-CM | POA: Diagnosis not present

## 2022-09-09 DIAGNOSIS — M7751 Other enthesopathy of right foot: Secondary | ICD-10-CM

## 2022-09-09 DIAGNOSIS — M79674 Pain in right toe(s): Secondary | ICD-10-CM | POA: Diagnosis not present

## 2022-09-09 MED ORDER — DOXYCYCLINE HYCLATE 100 MG PO TABS: 100.0000 mg | ORAL_TABLET | Freq: Two times a day (BID) | ORAL | 0 refills | Status: DC

## 2022-09-09 NOTE — Progress Notes (Unsigned)
Subjective: Chief Complaint  Patient presents with   Diabetes    Diabetic A1c- 6.0 nail trim and left hallux came off 2 weeks ago,   71 year old female presents for above concerns.  She states the left big toenail came off on its own a couple weeks ago but a piece still remains on the corners causing pain with pressure in the shoes.  No drainage or pus.  No recent treatment otherwise.  To be trimmed today as they are thick.  She still using the Penlac for nail fungus.  She has had injections previously and she is having some numbness.  She is walking differently and having into the right ankle.  Since she has numbness she may have stepped wrong. This started about 4 days ago. No injuries to the ankle.  No recent treatment for the ankle pain.  On Eliquis   Last A1c 6.8 on 07/19/2022   Objective: AAO x3, NAD DP/PT pulses palpable bilaterally, CRT less than 3 seconds  Right foot bunion.  No pain with calf compression, swelling, warmth, erythema  Assessment:  Plan: -All treatment options discussed with the patient including all alternatives, risks, complications.  -X-rays were obtained and reviewed with the patient. Right ankle 3 views -[place] -Patient encouraged to call the office with any questions, concerns, change in symptoms.

## 2022-09-09 NOTE — Patient Instructions (Signed)

## 2022-09-11 ENCOUNTER — Ambulatory Visit: Payer: Medicare PPO | Admitting: Physical Therapy

## 2022-09-18 ENCOUNTER — Ambulatory Visit: Payer: Medicare PPO | Attending: Obstetrics and Gynecology | Admitting: Physical Therapy

## 2022-09-18 ENCOUNTER — Encounter: Payer: Self-pay | Admitting: Physical Therapy

## 2022-09-18 DIAGNOSIS — R32 Unspecified urinary incontinence: Secondary | ICD-10-CM | POA: Diagnosis not present

## 2022-09-18 DIAGNOSIS — R159 Full incontinence of feces: Secondary | ICD-10-CM | POA: Insufficient documentation

## 2022-09-18 DIAGNOSIS — M6281 Muscle weakness (generalized): Secondary | ICD-10-CM | POA: Diagnosis not present

## 2022-09-18 DIAGNOSIS — R278 Other lack of coordination: Secondary | ICD-10-CM | POA: Insufficient documentation

## 2022-09-18 NOTE — Therapy (Signed)
OUTPATIENT PHYSICAL THERAPY TREATMENT NOTE   Patient Name: [name] MRN: [id number] DOB:04-Nov-1950, 71 y.o., female Today's Date: 09/18/2022  PCP: [name], [name], MD   REFERRING PROVIDER: Jaquita Folds, MD   END OF SESSION:   PT End of Session - 09/18/22 1248     Visit Number 7    Date for PT Re-Evaluation 10/06/22    Authorization Type humana    Authorization Time Period -    Authorization - Visit Number 6    Authorization - Number of Visits 12    Progress Note Due on Visit 10    PT Start Time 1243    PT Stop Time 1310    PT Time Calculation (min) 27 min    Activity Tolerance Patient tolerated treatment well    Behavior During Therapy WFL for tasks assessed/performed             Past Medical History:  Diagnosis Date   Abdominal pain, epigastric 12/28/2008   Centricity Description: ABDOMINAL PAIN, EPIGASTRIC Qualifier: Diagnosis of  By: [name], [name]   Centricity Description: ABDOMINAL PAIN-EPIGASTRIC Qualifier: Diagnosis of  By: [name], [name]    Allergic rhinitis 09/16/2013   Allergy    Anemia    Anxiety    Anxiety disorder, unspecified 07/06/2017   Arthritis    Blood in stool    BMI 45.0-49.9, adult ([place]) 07/26/2016   Class 3 obesity with serious comorbidity and body mass index (BMI) of 45.0 to 49.9 in adult 03/07/2017   DDD (degenerative disc disease), lumbar    Diabetes mellitus without complication ([place])    DIVERTICULOSIS-COLON 12/28/2008   Qualifier: Diagnosis of  By: [name], [name]    GERD 12/28/2008   Qualifier: Diagnosis of  By: [name], [name]    GERD (gastroesophageal reflux disease)    Headache    Hyperglycemia 03/17/2014   Hypertension    Hypokalemia 07/26/2016   IBS (irritable bowel syndrome)-C-D 07/26/2016   Obesity    Other and unspecified hyperlipidemia 03/17/2014   PERSONAL HX COLONIC POLYPS 12/28/2008   Qualifier: Diagnosis of  By: [name], [name]    Reflux esophagitis  12/28/2008   Qualifier: Diagnosis of  By: [name], [name]     S/P lumbar spinal fusion 07/02/2012   Tubulovillous adenoma of colon 2008   Past Surgical History:  Procedure Laterality Date   ABDOMINAL HYSTERECTOMY     COLONOSCOPY  2014   laser surgery to remove scar     not sure of the year; after hyst   RADIOLOGY WITH ANESTHESIA N/A 02/25/2015   Procedure: MRI OF LUMBAR SPINE;  Surgeon: Medication Radiologist, MD;  Location: [facility];  Service: Radiology;  Laterality: N/A;  DR. Bennie Pierini COHEN/MRI   SPINE SURGERY     TONSILLECTOMY AND ADENOIDECTOMY     Patient Active Problem List   Diagnosis Date Noted   Rectal bleed 03/31/2022   Vaginal itching 09/27/2021   Atypical atrial flutter ([place]) 07/05/2021   Secondary hypercoagulable state ([place]) 06/06/2021   Atrial fibrillation ([place]) 04/16/2020   Symptomatic spider varicose vein 05/15/2018   Bradycardia, sinus 09/07/2017   Anxiety disorder, unspecified 07/06/2017   Hyperlipidemia associated with type 2 diabetes mellitus ([place]) 04/26/2017   Morbid obesity ([place]) 03/07/2017   Hypokalemia 07/26/2016   IBS (irritable bowel syndrome)-C-D 07/26/2016   Morbid obesity with BMI of 45.0-49.9, adult ([place]) 07/26/2016   Type 2 diabetes mellitus with other specified complication ([place]) 37/90/2409   Essential hypertension  03/17/2014   Other and unspecified hyperlipidemia 03/17/2014   Hyperglycemia 03/17/2014   Allergic rhinitis 09/16/2013   Arthritis 07/02/2012   S/P lumbar spinal fusion 07/02/2012   REFLUX ESOPHAGITIS 12/28/2008   GERD 12/28/2008   DIVERTICULOSIS-COLON 12/28/2008   Abdominal pain, epigastric 12/28/2008   PERSONAL HX COLONIC POLYPS 12/28/2008   REFERRING DIAG:  N32.81 (ICD-10-CM) - Overactive bladder  N39.3 (ICD-10-CM) - SUI (stress urinary incontinence, female)  R15.9 (ICD-10-CM) - Incontinence of feces, unspecified fecal incontinence type  M62.838 (ICD-10-CM) - Levator spasm      THERAPY DIAG:  Muscle weakness  (generalized) - Plan: PT plan of care cert/re-cert   Other lack of coordination - Plan: PT plan of care cert/re-cert   Incontinence of feces, unspecified fecal incontinence type - Plan: PT plan of care cert/re-cert   Urinary incontinence, unspecified type - Plan: PT plan of care cert/re-cert   Rationale for Evaluation and Treatment Rehabilitation   ONSET DATE: 2018   SUBJECTIVE:                                                                                                                                                                                            SUBJECTIVE STATEMENT: I had my one foot operated on. I have sprained the other ankle to protect the surgery foot.    PAIN:  Are you having pain? Yes: NPRS scale: 8/10 Pain location: right side of the body Pain description: constant Aggravating factors: movement Relieving factors: rest   PAIN:  Are you having pain? No   PRECAUTIONS: None   WEIGHT BEARING RESTRICTIONS No   FALLS:  Has patient fallen in last 6 months? No   LIVING ENVIRONMENT: Lives with: lives alone   OCCUPATION: retired   PLOF: Independent   PATIENT GOALS reduce the number of pads she uses   PERTINENT HISTORY:  A Fib; DM; chronic anticoagulation; bladder injury with her hysterectomy ;history of bladder instillations with DMSO ; IBS   BOWEL MOVEMENT Pain with bowel movement: No Type of bowel movement:Type (Bristol Stool Scale) Type 4 or Type 6, Frequency 1-4 times per day, Strain Yes, and Splinting yes Fully empty rectum: No Leakage: Yes: daily Pads: 4 pads and 3 depends daily   Fiber supplement: Yes: Miralx  for constipation,  Immodium for diarrhea   URINATION Pain with urination: No Fully empty bladder: No; push on her belly or vagina to empty bladder Stream: Weak Urgency: No Frequency: Day time voids- usually every few hours.  Nocturia: 3 times per night to void Leakage: Urge to void, Walking to the bathroom, Coughing, Sneezing,  Laughing, Lifting, and sit to stand Pads:  4 pads and 3 depends daily     PREGNANCY Vaginal deliveries 1    OBJECTIVE: (objective measures completed at initial evaluation unless otherwise dated)      DIAGNOSTIC FINDINGS:  Pelvic floor strength I/V, puborectalis I/V external anal sphincter II/V; PVR of 31 ml was obtained by bladder scan.   Pelvic floor musculature: Right levator tender, Right obturator tender, Left levator tender, Left obturator tender   PATIENT SURVEYS:  PFIQ-7 159 ; UIQ -7 81; CRAIQ-7 81   COGNITION:            Overall cognitive status: Within functional limits for tasks assessed                          SENSATION:            Light touch: Appears intact            Proprioception: Appears intact     GAIT: Assistive device utilized: Single point cane Level of assistance: Complete Independence Comments: walks with small steps                POSTURE: No Significant postural limitations, rounded shoulders, forward head, and anterior pelvic tilt               PELVIC ALIGNMENT:correct     LOWER EXTREMITY MMT:   MMT Right eval Left eval  Hip flexion 90 90  Hip external rotation 40 40     PALPATION:   General  Patient able to tighten her abdominals correctly                 External Perineal Exam stool came dripping out without control                             Internal Pelvic Floor No movement of the pelvic floor muscles, the anus was wide open    Patient confirms identification and approves PT to assess internal pelvic floor and treatment Yes   PELVIC MMT:   MMT eval  Vaginal 0/5  Internal Anal Sphincter 0/5  External Anal Sphincter 0/5  Puborectalis 0/5  (Blank rows = not tested)         TONE: increased   TODAY'S TREATMENT  09/18/2022 Exercises: Strengthening: Nustep for 8 minutes level 4 while assessing patient Walking sideways over orange bars with pelvic floor contraction 6 times Stepping over orange bars 4 times with pelvic  floor contraction Sitting pelvic floor contraction holding 5 sec Sitting with ball squeeze knee extension 20x each side with pelvic floor contraction with 5# on each ankle    09/06/2022   Exercises: Strengthening: Nustep level 3 for 10 min while assessing patient Sitting with ball squeeze bilateral elbow flexion holding 2# in each hand 20x Sitting with ball squeeze bilateral shoulder flexion holding 2# in each hand 10x 2  Sitting with ball squeeze knee extension 20x each side with pelvic floor contraction with 5# on each ankle Marching in sitting with 5# weight on the ankles and squeeze ball between hands.      08/28/2022 Neuromuscular re-education: Pelvic floor contraction training:( all exercises include pelvic floor and core engagement) :Mini squat with pelvic floor enagement 10x Sitting ball squeeze holding for 5 sec 10x while squeeze ball between your hands Walking sideways over orange bars with pelvic floor contraction 4 times Sitting pressing down on foam roll holding 5 sec with pelvic floor contraction 10x  Stepping over orange bars 4 times with pelvic floor contraction Sitting with ball squeeze knee extension 20x each side with pelvic floor contraction with 5# on each ankle Stand holding onto pole bringing one foot onto step and alternate with pelvic floor contraction Exercises: Strengthening: Nustep for 10 minutes level 4 while assessing patient       PATIENT EDUCATION: 08/02/2022 Education details: Access Code: [email or web address] Person educated: Patient Education method: Explanation, Demonstration, Tactile cues, Verbal cues, and Handouts Education comprehension: verbalized understanding, returned demonstration, verbal cues required, tactile cues required, and needs further education     HOME EXERCISE PROGRAM: 08/02/2022 Access Code: [email or web address] URL: [email or web address] Date: 08/02/2022 Prepared by: Earlie Counts   Exercises - Supine Hip Adduction Isometric  with Ball  - 1 x daily - 7 x weekly - 2 sets - 10 reps - Supine Transversus Abdominis Bracing - Hands on Thighs  - 1 x daily - 7 x weekly - 2 sets - 10 reps - 5 sec hold - Seated Hip Adduction Squeeze with Ball  - 1 x daily - 7 x weekly - 2 sets - 10 reps - 5 sec hold - Seated Hip Abduction with Resistance  - 1 x daily - 7 x weekly - 2 sets - 10 reps None today   ASSESSMENT:   CLINICAL IMPRESSION: Patient is a 71 y.o. female who was seen today for physical therapy  treatment for overactive bladder, stress urinary incontinence,fecal incontinence, and levator spasm. Patient still has trouble with feeling the anus contract due to numbness and reduction of sensation. Patient will not leak  when going from sit to stand. Patient is wearing a light pad instead of a heavy pad. Patient reports 80% improvement since initial evaluation. She does not have to strain to use the bathroom.  Patient will benefit from skilled therapy to improve pelvic floor strength and coordination to reduce leakage.      OBJECTIVE IMPAIRMENTS decreased activity tolerance, decreased coordination, decreased endurance, decreased strength, increased fascial restrictions, and impaired tone.    ACTIVITY LIMITATIONS carrying, lifting, bending, standing, transfers, continence, and toileting   PARTICIPATION LIMITATIONS: meal prep, cleaning, shopping, and community activity   PERSONAL FACTORS Age, Fitness, Past/current experiences, Time since onset of injury/illness/exacerbation, and 3+ comorbidities: A Fib; DM; chronic anticoagulation; bladder injury with her hysterectomy ;history of bladder instillations with DMSO ; IBS  are also affecting patient's functional outcome.    REHAB POTENTIAL: Good   CLINICAL DECISION MAKING: Evolving/moderate complexity   EVALUATION COMPLEXITY: Moderate     GOALS: Goals reviewed with patient? Yes   SHORT TERM GOALS: Target date: 08/11/2022   Patient able to contract the pelvic floor to 1/5 due to  improve coordination.  Baseline: Goal status:ongoing 08/28/2022   2.  Patient on a timed voiding program every 2 hours to reduce leakage.  Baseline:  Goal status: Met 08/14/2022   3.  Patient is able to start feeling stool leakage.  Baseline: not able to due to feeling numb on the buttocks Goal status: ongoing 08/28/2022     LONG TERM GOALS: Target date: 10/06/2022    Patient independent with her HEP for pelvic floor strengthening.  Baseline:  Goal status: ongoing 08/28/2022   2.  Patient is able to reduce her pad usage to 1-2 per day due to increased In pelvic floor strength >/= 3/5.  Baseline: wears 3 light day pads instead of heavy pads during the day.  Goal status: ongoing 08/28/2022   3.  Patient anus is able to  hug therapist finger due to improved pelvic floor contraction and reduction of fecal leakage.  Baseline:  Goal status: INITIAL   4.  PFIQ-7 decreased </= 80 from 159 due to improved leakage and reduction of her frustration with the leakage.  Baseline:  Goal status: INITIAL   5.  Patient went from sit to stand with urinary leakage decreased >/= 50%.  Baseline:  Goal status: Met 09/06/2022     PLAN: PT FREQUENCY: 1x/week   PT DURATION: 12 weeks   PLANNED INTERVENTIONS: Therapeutic exercises, Therapeutic activity, Neuromuscular re-education, Patient/Family education, Self Care, Biofeedback, and Manual therapy   PLAN FOR NEXT SESSION: work on pelvic floor contraction,  Standing exercises sit to stand   Earlie Counts, PT 09/18/22 1:14 PM

## 2022-09-19 ENCOUNTER — Other Ambulatory Visit: Payer: Self-pay | Admitting: Family Medicine

## 2022-09-22 ENCOUNTER — Ambulatory Visit: Payer: Medicare PPO | Admitting: Podiatry

## 2022-09-22 DIAGNOSIS — L600 Ingrowing nail: Secondary | ICD-10-CM

## 2022-09-22 NOTE — Patient Instructions (Signed)
Ankle Sprain, Phase I Rehab An ankle sprain is an injury to the ligaments of your ankle. Ankle sprains cause stiffness, loss of motion, and loss of strength. Ask your health care provider which exercises are safe for you. Do exercises exactly as told by your health care provider and adjust them as directed. It is normal to feel mild stretching, pulling, tightness, or discomfort as you do these exercises. Stop right away if you feel sudden pain or your pain gets worse. Do not begin these exercises until told by your health care provider. Stretching and range-of-motion exercises These exercises warm up your muscles and joints and improve the movement and flexibility of your lower leg and ankle. These exercises also help to relieve pain and stiffness. Gastroc and soleus stretch This exercise is also called a calf stretch. It stretches the muscles in the back of the lower leg. These muscles are the gastrocnemius, or gastroc, and the soleus. Sit on the floor with your left / right leg extended. Loop a belt or towel around the ball of your left / right foot. The ball of your foot is on the walking surface, right under your toes. Keep your left / right ankle and foot relaxed and keep your knee straight while you use the belt or towel to pull your foot toward you. You should feel a gentle stretch behind your calf or knee in your gastroc muscle. Hold this position for __________ seconds, then release to the starting position. Repeat the exercise with your knee bent. You can put a pillow or a rolled bath towel under your knee to support it. You should feel a stretch deep in your calf in the soleus muscle or at your Achilles tendon. Repeat __________ times. Complete this exercise __________ times a day. Ankle alphabet  Sit with your left / right leg supported at the lower leg. Do not rest your foot on anything. Make sure your foot has room to move freely. Think of your left / right foot as a paintbrush. Move  your foot to trace each letter of the alphabet in the air. Keep your hip and knee still while you trace. Make the letters as large as you can without feeling discomfort. Trace every letter from A to Z. Repeat __________ times. Complete this exercise __________ times a day. Strengthening exercises These exercises build strength and endurance in your ankle and lower leg. Endurance is the ability to use your muscles for a long time, even after they get tired. Ankle dorsiflexion  Secure a rubber exercise band or tube to an object, such as a table leg, that will stay still when the band is pulled. Secure the other end around your left / right foot. Sit on the floor facing the object, with your left / right leg extended. The band or tube should be slightly tense when your foot is relaxed. Slowly bring your foot toward you, bringing the top of your foot toward your shin (dorsiflexion), and pulling the band tighter. Hold this position for __________ seconds. Slowly return your foot to the starting position. Repeat __________ times. Complete this exercise __________ times a day. Ankle plantar flexion  Sit on the floor with your left / right leg extended. Loop a rubber exercise tube or band around the ball of your left / right foot. The ball of your foot is on the walking surface, right under your toes. Hold the ends of the band or tube in your hands. The band or tube should be slightly  tense when your foot is relaxed. Slowly point your foot and toes downward to tilt the top of your foot away from your shin (plantar flexion). Hold this position for __________ seconds. Slowly return your foot to the starting position. Repeat __________ times. Complete this exercise __________ times a day. Ankle eversion Sit on the floor with your legs straight out in front of you. Loop a rubber exercise band or tube around the ball of your left / right foot. The ball of your foot is on the walking surface, right under  your toes. Hold the ends of the band in your hands, or secure the band to a stable object. The band or tube should be slightly tense when your foot is relaxed. Slowly push your foot outward, away from your other leg (eversion). Hold this position for __________ seconds. Slowly return your foot to the starting position. Repeat __________ times. Complete this exercise __________ times a day. This information is not intended to replace advice given to you by your health care provider. Make sure you discuss any questions you have with your health care provider. Document Revised: 11/16/2020 Document Reviewed: 11/18/2020 Elsevier Patient Education  [place].

## 2022-09-23 ENCOUNTER — Other Ambulatory Visit: Payer: Self-pay | Admitting: Podiatry

## 2022-09-23 DIAGNOSIS — L600 Ingrowing nail: Secondary | ICD-10-CM

## 2022-09-23 DIAGNOSIS — B351 Tinea unguium: Secondary | ICD-10-CM

## 2022-09-23 DIAGNOSIS — Z7901 Long term (current) use of anticoagulants: Secondary | ICD-10-CM

## 2022-09-23 DIAGNOSIS — M79675 Pain in left toe(s): Secondary | ICD-10-CM

## 2022-09-23 DIAGNOSIS — M7751 Other enthesopathy of right foot: Secondary | ICD-10-CM

## 2022-09-23 DIAGNOSIS — M79671 Pain in right foot: Secondary | ICD-10-CM

## 2022-09-24 NOTE — Progress Notes (Signed)
Subjective: Chief Complaint  Patient presents with   Ingrown Toenail    Left hallux nail check,    71 year old female presents the office today for follow-up evaluation after having left hallux toenail removal.  She states that the healing well.  No drainage or pus.  No significant pain.   Objective: AAO x3, NAD DP/PT pulses palpable bilaterally, CRT less than 3 seconds Status post left hallux nail removal.  Swelling ambulation tissue still present concern is scabbed over.  There is no edema, erythema, drainage or pus or any signs of infection noted today. No pain with calf compression, swelling, warmth, erythema  Assessment: Status post hallux nail removal  Plan: -All treatment options discussed with the patient including all alternatives, risks, complications.  -Appears to be healing well.  Continue soaking Epsom salts, antibiotic ointment and dressing twice a day but can leave the area open at nighttime.  Monitor any signs or symptoms of infection. -Patient encouraged to call the office with any questions, concerns, change in symptoms.   Trula Slade DPM

## 2022-09-25 ENCOUNTER — Ambulatory Visit: Payer: Medicare PPO | Admitting: Physical Therapy

## 2022-09-25 ENCOUNTER — Encounter: Payer: Self-pay | Admitting: Physical Therapy

## 2022-09-25 DIAGNOSIS — R159 Full incontinence of feces: Secondary | ICD-10-CM | POA: Diagnosis not present

## 2022-09-25 DIAGNOSIS — M6281 Muscle weakness (generalized): Secondary | ICD-10-CM

## 2022-09-25 DIAGNOSIS — R278 Other lack of coordination: Secondary | ICD-10-CM | POA: Diagnosis not present

## 2022-09-25 DIAGNOSIS — R32 Unspecified urinary incontinence: Secondary | ICD-10-CM | POA: Diagnosis not present

## 2022-09-25 NOTE — Therapy (Signed)
OUTPATIENT PHYSICAL THERAPY TREATMENT NOTE   Patient Name: [name] MRN: [id number] DOB:11-Mar-1951, 71 y.o., female Today's Date: 09/25/2022  PCP: [name], [name], MD    REFERRING PROVIDER: Jaquita Folds, MD    END OF SESSION:   PT End of Session - 09/25/22 1234     Visit Number 8    Date for PT Re-Evaluation 10/06/22    Authorization Type humana    Authorization - Visit Number 7    Authorization - Number of Visits 12    Progress Note Due on Visit 10    PT Start Time 4268    PT Stop Time 1310    PT Time Calculation (min) 40 min    Activity Tolerance Patient tolerated treatment well    Behavior During Therapy WFL for tasks assessed/performed             Past Medical History:  Diagnosis Date   Abdominal pain, epigastric 12/28/2008   Centricity Description: ABDOMINAL PAIN, EPIGASTRIC Qualifier: Diagnosis of  By: [name], [name]   Centricity Description: ABDOMINAL PAIN-EPIGASTRIC Qualifier: Diagnosis of  By: [name], [name]    Allergic rhinitis 09/16/2013   Allergy    Anemia    Anxiety    Anxiety disorder, unspecified 07/06/2017   Arthritis    Blood in stool    BMI 45.0-49.9, adult ([place]) 07/26/2016   Class 3 obesity with serious comorbidity and body mass index (BMI) of 45.0 to 49.9 in adult 03/07/2017   DDD (degenerative disc disease), lumbar    Diabetes mellitus without complication ([place])    DIVERTICULOSIS-COLON 12/28/2008   Qualifier: Diagnosis of  By: [name], [name]    GERD 12/28/2008   Qualifier: Diagnosis of  By: [name], [name]    GERD (gastroesophageal reflux disease)    Headache    Hyperglycemia 03/17/2014   Hypertension    Hypokalemia 07/26/2016   IBS (irritable bowel syndrome)-C-D 07/26/2016   Obesity    Other and unspecified hyperlipidemia 03/17/2014   PERSONAL HX COLONIC POLYPS 12/28/2008   Qualifier: Diagnosis of  By: [name], [name]    Reflux esophagitis 12/28/2008   Qualifier:  Diagnosis of  By: [name], [name]     S/P lumbar spinal fusion 07/02/2012   Tubulovillous adenoma of colon 2008   Past Surgical History:  Procedure Laterality Date   ABDOMINAL HYSTERECTOMY     COLONOSCOPY  2014   laser surgery to remove scar     not sure of the year; after hyst   RADIOLOGY WITH ANESTHESIA N/A 02/25/2015   Procedure: MRI OF LUMBAR SPINE;  Surgeon: Medication Radiologist, MD;  Location: [facility];  Service: Radiology;  Laterality: N/A;  DR. Bennie Pierini COHEN/MRI   SPINE SURGERY     TONSILLECTOMY AND ADENOIDECTOMY     Patient Active Problem List   Diagnosis Date Noted   Rectal bleed 03/31/2022   Vaginal itching 09/27/2021   Atypical atrial flutter ([place]) 07/05/2021   Secondary hypercoagulable state ([place]) 06/06/2021   Atrial fibrillation ([place]) 04/16/2020   Symptomatic spider varicose vein 05/15/2018   Bradycardia, sinus 09/07/2017   Anxiety disorder, unspecified 07/06/2017   Hyperlipidemia associated with type 2 diabetes mellitus ([place]) 04/26/2017   Morbid obesity ([place]) 03/07/2017   Hypokalemia 07/26/2016   IBS (irritable bowel syndrome)-C-D 07/26/2016   Morbid obesity with BMI of 45.0-49.9, adult ([place]) 07/26/2016   Type 2 diabetes mellitus with other specified complication ([place]) 34/19/6222   Essential hypertension 03/17/2014   Other and  unspecified hyperlipidemia 03/17/2014   Hyperglycemia 03/17/2014   Allergic rhinitis 09/16/2013   Arthritis 07/02/2012   S/P lumbar spinal fusion 07/02/2012   REFLUX ESOPHAGITIS 12/28/2008   GERD 12/28/2008   DIVERTICULOSIS-COLON 12/28/2008   Abdominal pain, epigastric 12/28/2008   PERSONAL HX COLONIC POLYPS 12/28/2008   REFERRING DIAG:  N32.81 (ICD-10-CM) - Overactive bladder  N39.3 (ICD-10-CM) - SUI (stress urinary incontinence, female)  R15.9 (ICD-10-CM) - Incontinence of feces, unspecified fecal incontinence type  M62.838 (ICD-10-CM) - Levator spasm      THERAPY DIAG:  Muscle weakness (generalized) - Plan: PT plan of  care cert/re-cert   Other lack of coordination - Plan: PT plan of care cert/re-cert   Incontinence of feces, unspecified fecal incontinence type - Plan: PT plan of care cert/re-cert   Urinary incontinence, unspecified type - Plan: PT plan of care cert/re-cert   Rationale for Evaluation and Treatment Rehabilitation   ONSET DATE: 2018   SUBJECTIVE:                                                                                                                                                                                            SUBJECTIVE STATEMENT: I had my one foot operated on. I have sprained the other ankle to protect the surgery foot.      PAIN:  Are you having pain? Yes: NPRS scale: 8/10 Pain location: right side of the body Pain description: constant Aggravating factors: movement Relieving factors: rest   PAIN:  Are you having pain? No   PRECAUTIONS: None   WEIGHT BEARING RESTRICTIONS No   FALLS:  Has patient fallen in last 6 months? No   LIVING ENVIRONMENT: Lives with: lives alone   OCCUPATION: retired   PLOF: Independent   PATIENT GOALS reduce the number of pads she uses   PERTINENT HISTORY:  A Fib; DM; chronic anticoagulation; bladder injury with her hysterectomy ;history of bladder instillations with DMSO ; IBS   BOWEL MOVEMENT Pain with bowel movement: No Type of bowel movement:Type (Bristol Stool Scale) Type 4 or Type 6, Frequency 1-4 times per day, Strain Yes, and Splinting yes Fully empty rectum: No Leakage: Yes: daily Pads: 4 pads and 3 depends daily   Fiber supplement: Yes: Miralx  for constipation,  Immodium for diarrhea   URINATION Pain with urination: No Fully empty bladder: No; push on her belly or vagina to empty bladder Stream: Weak Urgency: No Frequency: Day time voids- usually every few hours.  Nocturia: 3 times per night to void Leakage: Urge to void, Walking to the bathroom, Coughing, Sneezing, Laughing, Lifting, and sit to  stand Pads: 4 pads and  3 depends daily     PREGNANCY Vaginal deliveries 1    OBJECTIVE: (objective measures completed at initial evaluation unless otherwise dated)       DIAGNOSTIC FINDINGS:  Pelvic floor strength I/V, puborectalis I/V external anal sphincter II/V; PVR of 31 ml was obtained by bladder scan.   Pelvic floor musculature: Right levator tender, Right obturator tender, Left levator tender, Left obturator tender   PATIENT SURVEYS:  PFIQ-7 159 ; UIQ -7 81; CRAIQ-7 81 09/25/22 PFIQ-7 85; UIQ -7 24; CRAIQ-7 57  COGNITION:            Overall cognitive status: Within functional limits for tasks assessed                          SENSATION:            Light touch: Appears intact            Proprioception: Appears intact     GAIT: Assistive device utilized: Single point cane Level of assistance: Complete Independence Comments: walks with small steps                POSTURE: No Significant postural limitations, rounded shoulders, forward head, and anterior pelvic tilt               PELVIC ALIGNMENT:correct     LOWER EXTREMITY MMT:   MMT Right eval Left eval  Hip flexion 90 90  Hip external rotation 40 40     PALPATION:   General  Patient able to tighten her abdominals correctly                 External Perineal Exam stool came dripping out without control                             Internal Pelvic Floor No movement of the pelvic floor muscles, the anus was wide open    Patient confirms identification and approves PT to assess internal pelvic floor and treatment no for 09/25/2022  PELVIC MMT:   MMT eval  Vaginal 0/5  Internal Anal Sphincter 0/5  External Anal Sphincter 0/5  Puborectalis 0/5  (Blank rows = not tested)         TONE: increased   TODAY'S TREATMENT  09/25/22 Exercises: Strengthening: Nustep level 3 for 10 min while assessing patient Walking sideways over orange bars with pelvic floor contraction 6 times Stepping over orange bars 4  times with pelvic floor contraction Sitting pelvic floor contraction holding 5 sec Sitting with ball squeeze knee extension 20x each side with pelvic floor contraction with 5# on each ankle Sitting ball squeeze working the pelvic floor 10x hold 5 sec  Sitting knee abduction with yellow band 20x with pelvic floor contraction    09/18/2022 Exercises: Strengthening: Nustep for 8 minutes level 4 while assessing patient Walking sideways over orange bars with pelvic floor contraction 6 times Stepping over orange bars 4 times with pelvic floor contraction Sitting pelvic floor contraction holding 5 sec Sitting with ball squeeze knee extension 20x each side with pelvic floor contraction with 5# on each ankle    09/06/2022 Exercises: Strengthening: Nustep level 3 for 10 min while assessing patient Sitting with ball squeeze bilateral elbow flexion holding 2# in each hand 20x Sitting with ball squeeze bilateral shoulder flexion holding 2# in each hand 10x 2  Sitting with ball squeeze knee extension 20x each side with  pelvic floor contraction with 5# on each ankle Marching in sitting with 5# weight on the ankles and squeeze ball between hands.        PATIENT EDUCATION: 09/25/2022 Education details: Access Code: [email or web address] Person educated: Patient Education method: Explanation, Demonstration, Tactile cues, Verbal cues, and Handouts Education comprehension: verbalized understanding, returned demonstration, verbal cues required, tactile cues required, and needs further education     HOME EXERCISE PROGRAM: 09/25/2022 Access Code: [email or web address] URL: [email or web address] Date: 08/02/2022 Prepared by: Earlie Counts   Exercises - Supine Hip Adduction Isometric with Ball  - 1 x daily - 7 x weekly - 2 sets - 10 reps - Supine Transversus Abdominis Bracing - Hands on Thighs  - 1 x daily - 7 x weekly - 2 sets - 10 reps - 5 sec hold - Seated Hip Adduction Squeeze with Ball  - 1 x daily - 7 x  weekly - 2 sets - 10 reps - 5 sec hold - Seated Hip Abduction with Resistance  - 1 x daily - 7 x weekly - 2 sets - 10 reps None today   ASSESSMENT:   CLINICAL IMPRESSION: Patient is a 71 y.o. female who was seen today for physical therapy  treatment for overactive bladder, stress urinary incontinence,fecal incontinence, and levator spasm. PFIQ-7 score went from 159 to 85. Patient still has trouble with feeling the anus contract due to numbness and reduction of sensation. Patient will not leak urine  when going from sit to stand. Patient is wearing a light pad instead of a heavy pad. Patient reports 80% improvement since initial evaluation with urinary leakage. She does not have to strain to use the bathroom. She is not aware of fecal leakage or when she contracts the anus. This has made if difficult for her progression with fecal incontinence.     OBJECTIVE IMPAIRMENTS decreased activity tolerance, decreased coordination, decreased endurance, decreased strength, increased fascial restrictions, and impaired tone.    ACTIVITY LIMITATIONS carrying, lifting, bending, standing, transfers, continence, and toileting   PARTICIPATION LIMITATIONS: meal prep, cleaning, shopping, and community activity   PERSONAL FACTORS Age, Fitness, Past/current experiences, Time since onset of injury/illness/exacerbation, and 3+ comorbidities: A Fib; DM; chronic anticoagulation; bladder injury with her hysterectomy ;history of bladder instillations with DMSO ; IBS  are also affecting patient's functional outcome.    REHAB POTENTIAL: Good   CLINICAL DECISION MAKING: Evolving/moderate complexity   EVALUATION COMPLEXITY: Moderate     GOALS: Goals reviewed with patient? Yes   SHORT TERM GOALS: Target date: 08/11/2022   Patient able to contract the pelvic floor to 1/5 due to improve coordination.  Baseline: Goal status:ongoing 08/28/2022   2.  Patient on a timed voiding program every 2 hours to reduce leakage.   Baseline:  Goal status: Met 08/14/2022   3.  Patient is able to start feeling stool leakage.  Baseline: not able to due to feeling numb on the buttocks Goal status: Not met 09/25/2022     LONG TERM GOALS: Target date: 10/06/2022    Patient independent with her HEP for pelvic floor strengthening.  Baseline:  Goal status: Met 09/25/2022  2.  Patient is able to reduce her pad usage to 1-2 per day due to increased In pelvic floor strength >/= 3/5.  Baseline: wears 3 light day pads instead of heavy pads during the day.  Goal status: Not met   3.  Patient anus is able to hug therapist finger due to improved pelvic floor contraction and reduction of fecal leakage.  Baseline:  Goal status: Not assessed   4.  PFIQ-7 decreased </= 80 from 159 due to improved leakage and reduction of her frustration with the leakage.  Baseline: PFIQ-7 85 Goal status: Not met   5.  Patient went from sit to stand with urinary leakage decreased >/= 50%.  Baseline: 60% better.  Goal status: Met 09/06/2022     PLAN: PLAN ; Discharge to HEP today    Earlie Counts, PT 09/25/22 12:37 PM PHYSICAL THERAPY DISCHARGE SUMMARY  Visits from Start of Care: 8  Current functional level related to goals / functional outcomes: See above.   Remaining deficits: See above. Patient fecal incontinence has not improved with general strengthening. She is having difficulty with feeling a pelvic floor contraction or the feeling of stool coming out. She reports numbness in the rectal area. Patient has met her goals with the urinary leakage but not with the fecal leakage.   Education / Equipment: HEP   Patient agrees to discharge. Patient goals were partially met. Patient is being discharged due to maximized rehab potential. Thank you for the referral. Earlie Counts, PT 09/25/22 2:10 PM

## 2022-09-28 ENCOUNTER — Other Ambulatory Visit: Payer: Self-pay | Admitting: Family Medicine

## 2022-10-06 ENCOUNTER — Other Ambulatory Visit: Payer: Self-pay | Admitting: Family Medicine

## 2022-10-06 DIAGNOSIS — I10 Essential (primary) hypertension: Secondary | ICD-10-CM

## 2022-10-16 ENCOUNTER — Ambulatory Visit: Payer: Medicare PPO | Admitting: Podiatry

## 2022-10-16 ENCOUNTER — Other Ambulatory Visit: Payer: Self-pay | Admitting: Family Medicine

## 2022-10-16 VITALS — BP 134/78

## 2022-10-16 DIAGNOSIS — M79674 Pain in right toe(s): Secondary | ICD-10-CM

## 2022-10-16 DIAGNOSIS — B351 Tinea unguium: Secondary | ICD-10-CM

## 2022-10-16 DIAGNOSIS — M79675 Pain in left toe(s): Secondary | ICD-10-CM

## 2022-10-16 DIAGNOSIS — M7751 Other enthesopathy of right foot: Secondary | ICD-10-CM

## 2022-10-16 DIAGNOSIS — Z7901 Long term (current) use of anticoagulants: Secondary | ICD-10-CM | POA: Diagnosis not present

## 2022-10-16 NOTE — Progress Notes (Unsigned)
Subjective: Chief Complaint  Patient presents with   Nail Problem    Nail check/ nail trim    72 year old female presents the office today for thick, elongated toenails that she is unable to trim her self.  They are in the left hallux toenail that was removed is been healing well.  Otherwise nails are causing discomfort is again elongated.  No swelling redness or any drainage.  In regards to her ankle she states that she has some discomfort she bends it up or to the sides but not when going down.  No injuries.  She is scheduled with physical therapy to start.  She gets chronic swelling to her legs.  Originally she was told it was cardiac in nature then she was told it was sciatica.  She thinks that she needs to follow back up with her cardiologist.   Objective: AAO x3, NAD DP/PT pulses palpable bilaterally, CRT less than 3 seconds Nails are hypertrophic, dystrophic, brittle, discolored, elongated 9. No surrounding redness or drainage. Tenderness nails 1-5 bilaterally except for the left hallux toenail which has been removed recently. No open lesions or pre-ulcerative lesions are identified today. No significant area of pinpoint tenderness of the ankle on the right side.  Symptoms lasing discomfort upon dorsiflexion, inversion, eversion.  There is no edema, erythema there is worsening.  She does get chronic edema present to bilateral lower extremities.  There is no erythema or warmth.  There is no open lesions. No pain with calf compression, swelling, warmth, erythema  Assessment: Symptomatic onychomycosis, chronic lower extremity edema, right ankle tendonitis  Plan: -All treatment options discussed with the patient including all alternatives, risks, complications.  -Sharply debrided the nails x 9 without any complications or bleeding. Procedure site appears to be healing well. Monitor for any clinical signs or symptoms of infection and directed to call the office immediately should any  occur or go to the ER. -In regards to the ankle continue physical therapy.  Discussed using good arch support.  No improvement MRI. -She does get some pitting edema bilateral lower extremities.  She can follow-up with cardiology.  Encouraged elevation. -Patient encouraged to call the office with any questions, concerns, change in symptoms.   Trula Slade DPM

## 2022-10-19 ENCOUNTER — Other Ambulatory Visit: Payer: Self-pay | Admitting: Family Medicine

## 2022-10-19 DIAGNOSIS — K219 Gastro-esophageal reflux disease without esophagitis: Secondary | ICD-10-CM

## 2022-10-31 ENCOUNTER — Ambulatory Visit: Payer: Medicare PPO | Attending: Cardiovascular Disease | Admitting: Cardiovascular Disease

## 2022-10-31 ENCOUNTER — Encounter: Payer: Self-pay | Admitting: Cardiovascular Disease

## 2022-10-31 VITALS — BP 122/80 | HR 65 | Ht 61.0 in | Wt 240.8 lb

## 2022-10-31 DIAGNOSIS — I10 Essential (primary) hypertension: Secondary | ICD-10-CM | POA: Diagnosis not present

## 2022-10-31 DIAGNOSIS — I480 Paroxysmal atrial fibrillation: Secondary | ICD-10-CM

## 2022-10-31 NOTE — Patient Instructions (Signed)
Medication Instructions:  Your physician recommends that you continue on your current medications as directed. Please refer to the Current Medication list given to you today.  *If you need a refill on your cardiac medications before your next appointment, please call your pharmacy*   Lab Work: none If you have labs (blood work) drawn today and your tests are completely normal, you will receive your results only by: [place] (if you have MyChart) OR A paper copy in the mail If you have any lab test that is abnormal or we need to change your treatment, we will call you to review the results.   Testing/Procedures: none   Follow-Up: At [facility], you and your health needs are our priority.  As part of our continuing mission to provide you with exceptional heart care, we have created designated Provider Care Teams.  These Care Teams include your primary Cardiologist (physician) and Advanced Practice Providers (APPs -  Physician Assistants and Nurse Practitioners) who all work together to provide you with the care you need, when you need it.  We recommend signing up for the patient portal called "MyChart".  Sign up information is provided on this After Visit Summary.  MyChart is used to connect with patients for Virtual Visits (Telemedicine).  Patients are able to view lab/test results, encounter notes, upcoming appointments, etc.  Non-urgent messages can be sent to your provider as well.   To learn more about what you can do with MyChart, go to [email or web address].    Your next appointment:   12 month(s)  Provider:   Mertie Moores, MD     Other Instructions

## 2022-10-31 NOTE — Progress Notes (Signed)
Cardiology Office Note:    Date:  10/31/2022   ID:  [name], [name] 01-01-1951, MRN [id number]  PCP:  [name], [name], MD  Cardiologist:  Mertie Moores, MD    Referring MD: [name], [name], MD   Chief Complaint  Patient presents with   Atrial Fibrillation   Problem list 1.  Sinus bradycardia 2.  Essential hypertension  10/16/17    [name] is a 72 y.o. female with a hx of  HTN. She has been on Atenolol - dose has been gradually decreased due to sinus bradycardia .  She was started on Lotrel ( amlodipine - benazepril  10-20 mg a day )  The Lotrel has caused her legs to swell and has pain in her bladder when she urinates.   Has also developed chills since she has been taking the Lotrel .   Has never had any syncope.   Has occasional "swimmy headedness"  and orthostasis .   Family hx of MI and strokes in her family   She eats fast foods and prepared foods 3-4 times a week. Walks every other day  - 1/4 mile a day   She fell on Dec. 11 ( was pushing her mother up a wheelchair ramp and fell in the ice )   December 05, 2017: [name] was seen today for follow-up of her high blood pressure.  Her blood pressure has been much better only. Is eating better,  Is sweating lots.    Was told to drink Gatorade several times a week.  Wt is 244 .   Has lost 9 lbs   Has not working out regularly  Has had some ortho  issues  Is eating out much less.   April 21, 2020:  Seen back after ~ 2 year absence  Has no energy. Had palpitations last night - lasted a few minutes.  Was in the bed so no CP , no dyspnea Has lost 4 lbs this past week .  Labs from her primary MD were reveiwed and look ok  Takes a teaspoon of salt 1-2 times a week for leg cramps.   She is now on some leg cramp medicine .  Was advised not to eat salt.  She eats fast foods 3 times a week,  [employer], [employer], [employer], cook out  [employer] several times a week Eats bologna, sausages  Aug.  3, 2022:  [name] is seen for follow up of her Afib  Is in Afib today , in on Eliquis   BP is well controlled.  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  She is not interested in starting anything else at this time .   December 12, 2021: [name] is seen for follow up of her atrial fib Is on eliqis Her mother passed away in 2021/10/29 Was seen in the Afib clinic on Jan. 26, 2023 She was in [facility] at the time On flecainide, atenolol 12.5 QD ( with an occasional extra 1/2 tab)  Wt is 245  Has fallen twice , is not sure she can walk now  October 31, 2022:  Has had some leg swelling  Is participating in PT  Her legs are very weak  Past Medical History:  Diagnosis Date   Abdominal pain, epigastric 12/28/2008   Centricity Description: ABDOMINAL PAIN, EPIGASTRIC Qualifier: Diagnosis of  By: [name], [name]   Centricity Description: ABDOMINAL PAIN-EPIGASTRIC Qualifier: Diagnosis of  By: [name], [name]    Allergic rhinitis 09/16/2013   Allergy    Anemia    Anxiety    Anxiety disorder, unspecified 07/06/2017   Arthritis    Blood in stool    BMI 45.0-49.9, adult ([place]) 07/26/2016   Class 3 obesity with serious comorbidity and body mass index (BMI) of 45.0 to 49.9 in adult 03/07/2017   DDD (degenerative disc disease), lumbar    Diabetes mellitus without complication ([place])    DIVERTICULOSIS-COLON 12/28/2008   Qualifier: Diagnosis of  By: [name], [name]    GERD 12/28/2008   Qualifier: Diagnosis of  By: [name], [name]    GERD (gastroesophageal reflux disease)    Headache    Hyperglycemia 03/17/2014   Hypertension    Hypokalemia 07/26/2016   IBS (irritable bowel syndrome)-C-D 07/26/2016   Obesity    Other and unspecified hyperlipidemia  03/17/2014   PERSONAL HX COLONIC POLYPS 12/28/2008   Qualifier: Diagnosis of  By: [name], [name]    Reflux esophagitis 12/28/2008   Qualifier: Diagnosis of  By: [name], [name]     S/P lumbar spinal fusion 07/02/2012   Tubulovillous adenoma of colon 2008    Past Surgical History:  Procedure Laterality Date   ABDOMINAL HYSTERECTOMY     COLONOSCOPY  2014   laser surgery to remove scar     not sure of the year; after hyst   RADIOLOGY WITH ANESTHESIA N/A 02/25/2015   Procedure: MRI OF LUMBAR SPINE;  Surgeon: Medication Radiologist, MD;  Location: [facility];  Service: Radiology;  Laterality: N/A;  DR. Bennie Pierini COHEN/MRI   SPINE SURGERY     TONSILLECTOMY AND ADENOIDECTOMY      Current Medications: Current Meds  Medication Sig   Accu-Chek Softclix Lancets lancets Use to check blood sugars 1-2 times daily.   amLODipine (NORVASC) 5 MG tablet Take 1 tablet by mouth once daily   Aspirin-Salicylamide-Caffeine (BC FAST PAIN RELIEF) 650-195-33.3 MG PACK Take 1 tablet by mouth as needed.   atenolol (TENORMIN) 25 MG tablet Take 0.5 tablets (12.5 mg total) by mouth daily. May take an extra 1/2 tablet for breakthrough afib   azelastine (ASTELIN) 0.1 % nasal spray Place 2 sprays into both nostrils 2 (two) times daily. Use in each nostril as directed   bisacodyl (BISACODYL LAXATIVE) 10 MG suppository Place 1 suppository (10 mg total) rectally daily as needed for moderate constipation.   Blood Glucose Monitoring Suppl (ACCU-CHEK GUIDE ME) w/Device KIT USE ONCE TO TWICE DAILY TO CHECK BLOOD SUGARS   calcium carbonate (TUMS EX) 750 MG chewable tablet Chew 2 tablets by mouth daily as needed for heartburn.   Cetirizine HCl (ZYRTEC ALLERGY PO) Take by mouth.   ciclopirox (PENLAC) 8 % solution Apply topically at bedtime. Apply over nail and surrounding skin. Apply daily over previous coat. After seven (7) days, may remove with alcohol and continue cycle.   cyclobenzaprine (FLEXERIL) 10 MG tablet Take  2.5 mg by mouth daily as needed (pain level 8 or greater).    Docusate Sodium (STOOL SOFTENER LAXATIVE PO) Take by mouth.   ELIQUIS 5 MG TABS tablet Take 1 tablet by mouth twice daily   fluticasone (FLONASE) 50 MCG/ACT nasal spray Use 2 spray(s) in each nostril once  daily   glucose blood (ACCU-CHEK GUIDE) test strip Use to test blood sugars 1-2 times daily.   hydrochlorothiazide (HYDRODIURIL) 25 MG tablet Take 1 tablet (25 mg total) by mouth daily.   HYDROcodone-acetaminophen (NORCO/VICODIN) 5-325 MG tablet Take 0.5 tablets by mouth daily as needed (pain level 8 or greater).   hydrocortisone 2.5 % ointment APPLY TOPICALLY DAILY AS NEEDED   hydrOXYzine (ATARAX) 25 MG tablet TAKE 1 TABLET BY MOUTH THREE TIMES DAILY AS NEEDED FOR ANXIETY   Magnesium Hydroxide (DULCOLAX PO) Take by mouth.   Menthol-Zinc Oxide (CALMOSEPTINE) 0.44-20.6 % OINT Apply topically.   miconazole (REMEDY PHYTOPLEX ANTIFUNGAL) 2 % powder Apply topically as needed for itching.   Multiple Vitamins-Minerals (MULTI-VITAMIN GUMMIES PO) Take by mouth as needed.   omeprazole (PRILOSEC) 40 MG capsule Take 40 mg by mouth 2 (two) times daily.   Polyethylene Glycol 3350 (MIRALAX PO) Take by mouth as needed.   potassium chloride (KLOR-CON) 10 MEQ tablet Take 2 tablets by mouth once daily   pravastatin (PRAVACHOL) 20 MG tablet Take by mouth twice a week   Simethicone (GAS-X PO) Take by mouth.   trolamine salicylate (ASPERCREME) 10 % cream Apply 1 application topically as needed for muscle pain.     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   Social Determinants of Health   Financial Resource Strain: Low Risk  (08/14/2022)   Overall Financial Resource Strain (CARDIA)    Difficulty of Paying Living Expenses: Not hard at all  Food Insecurity: No Food Insecurity (08/14/2022)   Hunger Vital Sign    Worried About Running Out of Food in the Last Year: Never true    Ran Out of Food in the Last Year: Never true  Transportation Needs: No Transportation Needs (08/14/2022)   PRAPARE - Hydrologist (Medical): No    Lack of Transportation (Non-Medical): No  Physical Activity: Insufficiently Active (08/11/2022)   Exercise Vital Sign    Days of Exercise per Week: 5 days    Minutes of Exercise per Session: 20 min  Stress: No Stress Concern Present (08/11/2022)   [place]    Feeling of Stress : Not at all  Social Connections: Moderately Integrated (08/11/2022)   Social Connection and Isolation Panel [NHANES]    Frequency of Communication with Friends and Family: More than three  times a week    Frequency of Social Gatherings with Friends and Family: More than three times a week    Attends Religious Services: More than 4 times per year    Active Member of Clubs or Organizations: Yes    Attends Archivist Meetings: More than 4 times  per year    Marital Status: Widowed     Family History: The patient's family history includes Arthritis in her father; Breast cancer in her cousin and paternal aunt; Cancer in her maternal grandfather; Cervical cancer in her sister; Diabetes in her sister; Heart disease in her sister; Hyperlipidemia in her father, mother, and sister; Hypertension in her father, mother, and paternal grandmother; Pancreatic cancer in her maternal aunt.  ROS:   As per current hx. Otherwise negatve    EKGs/Labs/Other Studies Reviewed:    The following studies were reviewed today:    Recent Labs: 04/03/2022: ALT 24; BUN 21; Creatinine, Ser 1.03; Hemoglobin 13.9; Platelets 257; Potassium 4.1; Sodium 141  Recent Lipid Panel    Component Value Date/Time   CHOL 158 08/17/2021 1004   TRIG 89.0 08/17/2021 1004   HDL 54.10 08/17/2021 1004   CHOLHDL 3 08/17/2021 1004   VLDL 17.8 08/17/2021 1004   LDLCALC 86 08/17/2021 1004   Physical Exam: Blood pressure 122/80, pulse 65, height '5\' 1"'$  (1.549 m), weight 240 lb 12.8 oz (109.2 kg), SpO2 96 %.     GEN:  Well nourished, well developed in no acute distress HEENT: Normal NECK: No JVD; No carotid bruits LYMPHATICS: No lymphadenopathy CARDIAC: RRR , no murmurs, rubs, gallops RESPIRATORY:  Clear to auscultation without rales, wheezing or rhonchi  ABDOMEN: Soft, non-tender, non-distended MUSCULOSKELETAL:  No edema; No deformity  SKIN: Warm and dry NEUROLOGIC:  Alert and oriented x 3  EKG    ASSESSMENT:    No diagnosis found.   PLAN:    1.  Atrial fib:    Clinicly  it sounds like she is in normal sinus rhythm currently.  No changes in medication.   2.  Essential hypertension:   Well-controlled.  I encouraged her to lose weight.  Blood pressure is   3.  Obesity:       4.  Fatigue:   She is extremely weak.  She is getting physical therapy.  I told her that this is a major priority for her.   Medication Adjustments/Labs and Tests  Ordered: Current medicines are reviewed at length with the patient today.  Concerns regarding medicines are outlined above.  No orders of the defined types were placed in this encounter.     No orders of the defined types were placed in this encounter.    Signed, Mertie Moores, MD  10/31/2022 5:17 PM    [place]

## 2022-11-09 ENCOUNTER — Ambulatory Visit (HOSPITAL_COMMUNITY)
Admission: RE | Admit: 2022-11-09 | Discharge: 2022-11-09 | Disposition: A | Discharge status: Home / Self Care | Payer: Medicare PPO | Source: Ambulatory Visit | Attending: Physician Assistant | Admitting: Physician Assistant

## 2022-11-09 ENCOUNTER — Encounter (HOSPITAL_COMMUNITY): Payer: Self-pay | Admitting: Physician Assistant

## 2022-11-09 VITALS — BP 124/84 | HR 57 | Ht 61.0 in | Wt 240.2 lb

## 2022-11-09 DIAGNOSIS — E669 Obesity, unspecified: Secondary | ICD-10-CM | POA: Diagnosis not present

## 2022-11-09 DIAGNOSIS — I480 Paroxysmal atrial fibrillation: Secondary | ICD-10-CM | POA: Diagnosis not present

## 2022-11-09 DIAGNOSIS — I10 Essential (primary) hypertension: Secondary | ICD-10-CM | POA: Diagnosis not present

## 2022-11-09 DIAGNOSIS — Z7901 Long term (current) use of anticoagulants: Secondary | ICD-10-CM | POA: Insufficient documentation

## 2022-11-09 DIAGNOSIS — D6869 Other thrombophilia: Secondary | ICD-10-CM

## 2022-11-09 DIAGNOSIS — E785 Hyperlipidemia, unspecified: Secondary | ICD-10-CM | POA: Diagnosis not present

## 2022-11-09 DIAGNOSIS — R600 Localized edema: Secondary | ICD-10-CM | POA: Insufficient documentation

## 2022-11-09 DIAGNOSIS — Z6841 Body Mass Index (BMI) 40.0 and over, adult: Secondary | ICD-10-CM | POA: Diagnosis not present

## 2022-11-09 DIAGNOSIS — I484 Atypical atrial flutter: Secondary | ICD-10-CM | POA: Insufficient documentation

## 2022-11-09 LAB — BRAIN NATRIURETIC PEPTIDE: B Natriuretic Peptide: 50.2 pg/mL (ref 0.0–100.0)

## 2022-11-09 MED ORDER — FLECAINIDE ACETATE 100 MG PO TABS: 100.0000 mg | ORAL_TABLET | Freq: Two times a day (BID) | ORAL | 3 refills | Status: DC

## 2022-11-09 NOTE — Progress Notes (Signed)
Primary Care Physician: [name], [name], MD Primary Cardiologist: Dr Acie Fredrickson Primary Electrophysiologist: none Referring Physician: Dr Doristine Mango [name] is a 72 y.o. female with a history of HTN, HLD, and atrial fibrillation who presents for follow up in the [facility].  The patient was initially diagnosed with atrial fibrillation 04/2020 after presenting to the her PCP office with palpitations. She also wore a 30 day event monitor which showed a 7% afib burden. Patient is on Eliquis for a CHADS2VASC score of 4. Patient reports that recently she has had more episodes of palpitations at rest. She was changed to metoprolol but this did not control her heart rate as well although she was on prednisone at the time. She admits to snoring and daytime somnolence.   On follow up today, patient reports that she is doing well from a cardiac standpoint. She did have some elevated heart rates after a steroid injection but this resolved quickly. No bleeding issues on anticoagulation. She does complain of numbness in her lower extremities bilaterally, she sees orthopedics and spine already and will see them soon.   Today, she denies symptoms of palpitations, chest pain, orthopnea, PND, dizziness, presyncope, syncope, bleeding, or neurologic sequela. The patient is tolerating medications without difficulties and is otherwise without complaint today.    Atrial Fibrillation Risk Factors:  she does have symptoms or diagnosis of sleep apnea. She declines sleep study. she does not have a history of rheumatic fever. she does not have a history of alcohol use.   she has a BMI of Body mass index is 45.39 kg/m.Marland Kitchen Filed Weights   11/09/22 0905  Weight: 109 kg    Family History  Problem Relation Age of Onset   Hypertension Mother    Hyperlipidemia Mother    Hyperlipidemia Father    Hypertension Father    Arthritis Father    Hyperlipidemia Sister    Cervical  cancer Sister    Diabetes Sister    Heart disease Sister    Cancer Maternal Grandfather    Hypertension Paternal Grandmother    Pancreatic cancer Maternal Aunt    Breast cancer Paternal Aunt    Breast cancer Cousin      Atrial Fibrillation Management history:  Previous antiarrhythmic drugs: flecainide  Previous cardioversions: none Previous ablations: none CHADS2VASC score: 4 Anticoagulation history: Eliquis   Past Medical History:  Diagnosis Date   Abdominal pain, epigastric 12/28/2008   Centricity Description: ABDOMINAL PAIN, EPIGASTRIC Qualifier: Diagnosis of  By: [name], [name]   Centricity Description: ABDOMINAL PAIN-EPIGASTRIC Qualifier: Diagnosis of  By: [name], [name]    Allergic rhinitis 09/16/2013   Allergy    Anemia    Anxiety    Anxiety disorder, unspecified 07/06/2017   Arthritis    Blood in stool    BMI 45.0-49.9, adult ([place]) 07/26/2016   Class 3 obesity with serious comorbidity and body mass index (BMI) of 45.0 to 49.9 in adult 03/07/2017   DDD (degenerative disc disease), lumbar    Diabetes mellitus without complication ([place])    DIVERTICULOSIS-COLON 12/28/2008   Qualifier: Diagnosis of  By: [name], [name]    GERD 12/28/2008   Qualifier: Diagnosis of  By: [name], [name]    GERD (gastroesophageal reflux disease)    Headache    Hyperglycemia 03/17/2014   Hypertension    Hypokalemia 07/26/2016   IBS (irritable bowel syndrome)-C-D 07/26/2016   Obesity    Other and unspecified hyperlipidemia  03/17/2014   PERSONAL HX COLONIC POLYPS 12/28/2008   Qualifier: Diagnosis of  By: [name], [name]    Reflux esophagitis 12/28/2008   Qualifier: Diagnosis of  By: Julaine Hua CMA (AAMA), [name]     S/P lumbar spinal fusion 07/02/2012   Tubulovillous adenoma of colon 2008   Past Surgical History:  Procedure Laterality Date   ABDOMINAL HYSTERECTOMY     COLONOSCOPY  2014   laser surgery to remove scar     not sure of the year;  after hyst   RADIOLOGY WITH ANESTHESIA N/A 02/25/2015   Procedure: MRI OF LUMBAR SPINE;  Surgeon: Medication Radiologist, MD;  Location: [place];  Service: Radiology;  Laterality: N/A;  DR. Bennie Pierini COHEN/MRI   SPINE SURGERY     TONSILLECTOMY AND ADENOIDECTOMY      Current Outpatient Medications  Medication Sig Dispense Refill   Accu-Chek Softclix Lancets lancets Use to check blood sugars 1-2 times daily. 100 each 12   amLODipine (NORVASC) 5 MG tablet Take 1 tablet by mouth once daily 90 tablet 2   Aspirin-Salicylamide-Caffeine (BC FAST PAIN RELIEF) 650-195-33.3 MG PACK Take 1 tablet by mouth as needed.     atenolol (TENORMIN) 25 MG tablet Take 0.5 tablets (12.5 mg total) by mouth daily. May take an extra 1/2 tablet for breakthrough afib 120 tablet 3   azelastine (ASTELIN) 0.1 % nasal spray Place 2 sprays into both nostrils 2 (two) times daily. Use in each nostril as directed 30 mL 6   Blood Glucose Monitoring Suppl (ACCU-CHEK GUIDE ME) w/Device KIT USE ONCE TO TWICE DAILY TO CHECK BLOOD SUGARS 1 kit 0   calcium carbonate (TUMS EX) 750 MG chewable tablet Chew 2 tablets by mouth daily as needed for heartburn.     Cetirizine HCl (ZYRTEC ALLERGY PO) Take by mouth.     ciclopirox (PENLAC) 8 % solution Apply topically at bedtime. Apply over nail and surrounding skin. Apply daily over previous coat. After seven (7) days, may remove with alcohol and continue cycle. 6.6 mL 0   cyclobenzaprine (FLEXERIL) 10 MG tablet Take 2.5 mg by mouth daily as needed (pain level 8 or greater).      Docusate Sodium (STOOL SOFTENER LAXATIVE PO) Take by mouth.     ELIQUIS 5 MG TABS tablet Take 1 tablet by mouth twice daily 180 tablet 0   fluticasone (FLONASE) 50 MCG/ACT nasal spray Use 2 spray(s) in each nostril once daily 48 g 0   glucose blood (ACCU-CHEK GUIDE) test strip Use to test blood sugars 1-2 times daily. 100 each 12   hydrochlorothiazide (HYDRODIURIL) 25 MG tablet Take 1 tablet (25 mg total) by mouth daily. 90  tablet 2   HYDROcodone-acetaminophen (NORCO/VICODIN) 5-325 MG tablet Take 0.5 tablets by mouth daily as needed (pain level 8 or greater). 30 tablet 0   hydrocortisone 2.5 % ointment APPLY TOPICALLY DAILY AS NEEDED 58 g 0   hydrOXYzine (ATARAX) 25 MG tablet TAKE 1 TABLET BY MOUTH THREE TIMES DAILY AS NEEDED FOR ANXIETY 90 tablet 1   Magnesium Hydroxide (DULCOLAX PO) Take by mouth.     Menthol-Zinc Oxide (CALMOSEPTINE) 0.44-20.6 % OINT Apply topically.     miconazole (REMEDY PHYTOPLEX ANTIFUNGAL) 2 % powder Apply topically as needed for itching.     Multiple Vitamins-Minerals (MULTI-VITAMIN GUMMIES PO) Take by mouth as needed.     omeprazole (PRILOSEC) 40 MG capsule Take 40 mg by mouth 2 (two) times daily.     Polyethylene Glycol 3350 (MIRALAX PO) Take by  mouth as needed.     potassium chloride (KLOR-CON) 10 MEQ tablet Take 2 tablets by mouth once daily 180 tablet 1   pravastatin (PRAVACHOL) 20 MG tablet Take by mouth twice a week 27 tablet 3   Simethicone (GAS-X PO) Take by mouth.     trolamine salicylate (ASPERCREME) 10 % cream Apply 1 application topically as needed for muscle pain.     flecainide (TAMBOCOR) 100 MG tablet Take 1 tablet (100 mg total) by mouth 2 (two) times daily. 180 tablet 3   No current facility-administered medications for this encounter.    Allergies  Allergen Reactions   Crestor [Rosuvastatin]    Erythromycin     rash   Jublia [Efinaconazole] Hives    Redness/burning around nail   Lansoprazole    Penicillins     rash   Potassium-Containing Compounds Other (See Comments)    Stomach upset, can take pills, but not the packets    Robaxin [Methocarbamol]     Messed up stomach   Sulfonamide Derivatives     rash   Tea Tree Oil     Redness, burning (localized)   Trental [Pentoxifylline Er]     Bad headache    Social History   Socioeconomic History   Marital status: Widowed    Spouse name: Not on file   Number of children: Not on file   Years of  education: Not on file   Highest education level: Some college, no degree  Occupational History   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   Social Determinants of Health   Financial Resource Strain: Low Risk  (08/14/2022)   Overall Financial Resource Strain (CARDIA)    Difficulty of Paying Living Expenses: Not hard at all  Food Insecurity: No Food Insecurity (08/14/2022)   Hunger Vital Sign    Worried About Running Out of Food in the Last Year: Never true    Ran Out of Food in the Last Year: Never true  Transportation Needs: No Transportation Needs (08/14/2022)   PRAPARE - Hydrologist (Medical): No    Lack of Transportation (Non-Medical): No  Physical Activity: Insufficiently Active (08/11/2022)   Exercise Vital Sign    Days of Exercise per Week: 5 days    Minutes of Exercise per Session: 20 min  Stress: No Stress Concern Present (08/11/2022)   [place]    Feeling of Stress : Not at all  Social Connections: Moderately Integrated (08/11/2022)   Social Connection and Isolation Panel [NHANES]    Frequency of Communication with Friends and Family: More than three times a week    Frequency of Social Gatherings with Friends and Family: More than three times a week    Attends Religious Services: More than 4 times per  year    Active Member of Clubs or  Organizations: Yes    Attends Archivist Meetings: More than 4 times per year    Marital Status: Widowed  Intimate Partner Violence: Not At Risk (08/11/2022)   Humiliation, Afraid, Rape, and Kick questionnaire    Fear of Current or Ex-Partner: No    Emotionally Abused: No    Physically Abused: No    Sexually Abused: No     ROS- All systems are reviewed and negative except as per the HPI above.  Physical Exam: Vitals:   11/09/22 0905  BP: 124/84  Pulse: (!) 57  Weight: 109 kg  Height: '5\' 1"'$  (1.549 m)    GEN- The patient is a well appearing obese female, alert and oriented x 3 today.   HEENT-head normocephalic, atraumatic, sclera clear, conjunctiva pink, hearing intact, trachea midline. Lungs- Clear to ausculation bilaterally, normal work of breathing Heart- Regular rate and rhythm, no murmurs, rubs or gallops  GI- soft, NT, ND, + BS Extremities- no clubbing, cyanosis, 1+ bilateral edema MS- no significant deformity or atrophy Skin- no rash or lesion Psych- euthymic mood, full affect Neuro- strength and sensation are intact   Wt Readings from Last 3 Encounters:  11/09/22 109 kg  10/31/22 109.2 kg  08/15/22 110.8 kg    EKG today demonstrates  SB Vent. rate 57 BPM PR interval 192 ms QRS duration 100 ms QT/QTcB 466/453 ms  Echo 05/11/20 demonstrated   1. Left ventricular ejection fraction, by estimation, is 60 to 65%. The left ventricle has normal function. The left ventricle has no regional wall motion abnormalities. Left ventricular diastolic parameters were normal.   2. Right ventricular systolic function is normal. The right ventricular  size is normal. There is normal pulmonary artery systolic pressure.   3. The mitral valve is normal in structure. Trivial mitral valve  regurgitation. No evidence of mitral stenosis.   4. The aortic valve is tricuspid. Aortic valve regurgitation is not  visualized. Mild aortic valve sclerosis is present, with no evidence  of aortic valve stenosis.   5. The inferior vena cava is normal in size with greater than 50%  respiratory variability, suggesting right atrial pressure of 3 mmHg.   Epic records are reviewed at length today  CHA2DS2-VASc Score = 4  The patient's score is based upon: CHF History: 0 HTN History: 1 Diabetes History: 1 Stroke History: 0 Vascular Disease History: 0 Age Score: 1 Gender Score: 1          ASSESSMENT AND PLAN: 1. Paroxysmal Atrial Fibrillation/atypical atrial flutter The patient's CHA2DS2-VASc score is 4, indicating a 4.8% annual risk of stroke.   Patient appears to be maintaining SR. Continue flecainide 100 mg BID Continue Eliquis 5 mg BID Continue atenolol 12.5 mg daily with an extra 12.5 mg for heart racing.  2. Secondary Hypercoagulable State (ICD10:  D68.69) The patient is at significant risk for stroke/thromboembolism based upon her CHA2DS2-VASc Score of 4.  Continue Apixaban (Eliquis).   3. Obesity Body mass index is 45.39 kg/m. Lifestyle modification was discussed and encouraged including regular physical activity and weight reduction.  4. HTN Stable, no changes today.  5. Lower extremity edema Patient having some lower extremity edema, wears compression stockings.  Will check BNP Suspect related to venous insuffiencey and CCB.   Follow up in the AF clinic in 6 months.    [facility] [address] [place], [place] [address] [phone] 11/09/2022 9:18  AM

## 2022-11-13 ENCOUNTER — Other Ambulatory Visit: Payer: Self-pay | Admitting: Family Medicine

## 2022-11-14 ENCOUNTER — Other Ambulatory Visit: Payer: Self-pay | Admitting: Family Medicine

## 2022-12-18 ENCOUNTER — Ambulatory Visit: Payer: Medicare PPO | Admitting: Podiatry

## 2022-12-18 DIAGNOSIS — M79674 Pain in right toe(s): Secondary | ICD-10-CM | POA: Diagnosis not present

## 2022-12-18 DIAGNOSIS — Z7901 Long term (current) use of anticoagulants: Secondary | ICD-10-CM

## 2022-12-18 DIAGNOSIS — B351 Tinea unguium: Secondary | ICD-10-CM

## 2022-12-18 DIAGNOSIS — M79675 Pain in left toe(s): Secondary | ICD-10-CM

## 2022-12-18 MED ORDER — CICLOPIROX 8 % EX SOLN: Freq: Every day | CUTANEOUS | 2 refills | Status: DC

## 2022-12-18 NOTE — Patient Instructions (Signed)
Look at getting "UREA NAIL GEL" for the thickening of the toenails.

## 2022-12-18 NOTE — Progress Notes (Signed)
Subjective: Chief Complaint  Patient presents with   DIABETIC FOOT CARE     Left foot, great hallux nail is digging in skin     72 year old female presents the office today for thick, elongated toenails that she is unable to trim herself.  She has been using ciclopirox and needs a refill.  She is not able to find the urea nail gel.  She wants me to write this down so her sister can help provide online.  She thinks the toenail that was removed previously is starting to grow back again towards the skin.  Last A1c was 6.0 on 07/19/2022 [name], [name], MD Last seen 08/11/2022  Objective: AAO x3, NAD DP/PT pulses palpable bilaterally, CRT less than 3 seconds Nails are hypertrophic, dystrophic, brittle, discolored, elongated 10. No surrounding redness or drainage. Tenderness nails 1-5 bilaterally.  The hallux toenail was removed previously started coming.  She concerned about going into the skin.  No significant incurvation today.  No open lesions or pre-ulcerative lesions are identified today. There is no significant tenderness palpation of the ankle.  She gets some discomfort along the sinus tarsi there is some edema present this area but no erythema or warmth.. No pain with calf compression, swelling, warmth, erythema  Assessment: Symptomatic onychomycosis,   Plan: -All treatment options discussed with the patient including all alternatives, risks, complications.  -Sharply debrided the nails x 10 without any complications or bleeding.  Refilled ciclopirox.  Discussed.  Urea nail gel to help with the thickening of the toenail.  -Offered steroid injection of the ankle but she decided to hold off on this today.  Continue shoes and good arch support.  Symptoms seem to be improving as well hold off on advanced imaging currently.   Return in about 9 weeks (around 02/19/2023).  Trula Slade DPM

## 2022-12-20 ENCOUNTER — Other Ambulatory Visit: Payer: Self-pay | Admitting: Family Medicine

## 2022-12-20 DIAGNOSIS — K219 Gastro-esophageal reflux disease without esophagitis: Secondary | ICD-10-CM

## 2023-01-08 ENCOUNTER — Telehealth: Payer: Self-pay | Admitting: Podiatry

## 2023-01-08 NOTE — Telephone Encounter (Signed)
Pt stated that left great toe nail is growing back in little pieces around the edge of nail bed. There's no pain but she wants to know if she can soak toe or what she needs to do. She has been sched to come in on 4/3 to see another provider, no availability for your sched. Please advise.

## 2023-01-10 ENCOUNTER — Encounter: Payer: Self-pay | Admitting: Podiatry

## 2023-01-10 ENCOUNTER — Ambulatory Visit: Payer: Medicare PPO | Admitting: Podiatry

## 2023-01-10 DIAGNOSIS — L600 Ingrowing nail: Secondary | ICD-10-CM

## 2023-01-10 NOTE — Progress Notes (Signed)
Subjective:   Patient ID: [name], female   DOB: 72 y.o.   MRN: [id number]   HPI Patient presents stating she is getting a painful ingrown toenail of the left big toe and it is making it hard for her to wear shoe gear comfortably she did lose the nail but the corners are in good   ROS      Objective:  Physical Exam  Neurovascular status intact with incurvation of the nail borders left big toe painful when pressed dug into the corners with the rest of the nail having fallen off but hopefully will heal normally     Assessment:  Ingrown toenail deformity hallux left medial lateral borders     Plan:  H&P reviewed recommended correction of the nail borders explained procedure risk at her read signed consent form understanding risk and I infiltrated the left hallux 60 mg like Marcaine mixture sterile prep done using sterile instrumentation remove the medial lateral borders exposed matrix applied phenol 3 applications 30 seconds followed by alcohol by sterile dressing gave instructions on soaks reappoint to recheck and leave dressing on 24 hours take it off earlier if throbbing were to occur and encouraged her to call questions concerns which may arise

## 2023-01-10 NOTE — Patient Instructions (Signed)

## 2023-01-18 ENCOUNTER — Other Ambulatory Visit: Payer: Self-pay | Admitting: Family Medicine

## 2023-01-18 DIAGNOSIS — K219 Gastro-esophageal reflux disease without esophagitis: Secondary | ICD-10-CM

## 2023-02-02 ENCOUNTER — Other Ambulatory Visit: Payer: Self-pay | Admitting: Family Medicine

## 2023-02-02 DIAGNOSIS — E876 Hypokalemia: Secondary | ICD-10-CM

## 2023-02-16 ENCOUNTER — Other Ambulatory Visit: Payer: Self-pay | Admitting: Family Medicine

## 2023-02-16 DIAGNOSIS — K219 Gastro-esophageal reflux disease without esophagitis: Secondary | ICD-10-CM

## 2023-02-20 ENCOUNTER — Ambulatory Visit: Payer: Medicare PPO | Admitting: Podiatry

## 2023-02-26 NOTE — Progress Notes (Unsigned)
ACUTE VISIT Chief Complaint  Patient presents with   Diarrhea    X 9 days   HPI: [name] is a 72 y.o. female, who is here today complaining of *** Diarrhea  This is a recurrent problem. The problem occurs 5 to 10 times per day. The problem has been unchanged. The patient states that diarrhea awakens her from sleep. Associated symptoms include bloating. Pertinent negatives include no chills, coughing, fever, headaches, increased  flatus, myalgias, vomiting or weight loss. She has tried anti-motility drug for the symptoms. Her past medical history is significant for inflammatory bowel disease.   [name] presents today with a chief complaint of persistent diarrhea for the past 9 days. She suspects it may be related to food poisoning after eating a [employer] sandwich and another sandwich that tasted off. She has been taking over-the-counter Imodium and Pepto-Bismol, which provided some initial relief. Her bowel movements are increased in frequency, every 2 hours, disrupting her sleep. The consistency is described as "globs" rather than watery stools. She also has ongoing issues with constipation, for which she has used Miralax and magnesium citrate with limited success.  She describes a burning sensation in her epigastrium but denies heartburn or acid reflux. She is currently taking omeprazole 40 mg twice daily.  She also took a course of doxycycline 2-3 months ago for a toenail issue.  Hemorrhoids that occasionally bleed, exacerbated by constipation and diarrhea.  Hypokalemia: She takes 2 tabs of KLOR daily, she notes that one of her potassium pills appears to be passing through intact.  She also reports fluctuating blood sugars, which she attributes to a steroid injection 2 months ago.   IBS constipation/diarrhea, rectal bleeding, and hemorrhoids.  Last evaluated by gastroenterologist, Dr. Russella Dar, on 10/16/2019. Lab Results  Component Value Date   TSH 2.68 08/17/2021    Lab Results  Component Value Date   WBC 12.8 (H) 04/03/2022   HGB 13.9 04/03/2022   HCT 42.6 04/03/2022   MCV 88.2 04/03/2022   PLT 257 04/03/2022    Lab Results  Component Value Date   HGBA1C 6.0 07/19/2022   Lab Results  Component Value Date   MICROALBUR <0.7 08/17/2021   MICROALBUR 1.6 08/11/2020   Review of Systems  Constitutional:  Negative for chills, fever and weight loss.  Respiratory:  Negative for cough.   Gastrointestinal:  Positive for bloating and diarrhea. Negative for flatus and vomiting.  Musculoskeletal:  Negative for myalgias.  Neurological:  Negative for headaches.   See other pertinent positives and negatives in HPI.  Current Outpatient Medications on File Prior to Visit  Medication Sig Dispense Refill   Accu-Chek Softclix Lancets lancets Use to check blood sugars 1-2 times daily. 100 each 12   amLODipine (NORVASC) 5 MG tablet Take 1 tablet by mouth once daily 90 tablet 2   Aspirin-Salicylamide-Caffeine (BC FAST PAIN RELIEF) 650-195-33.3 MG PACK Take 1 tablet by mouth as needed.     atenolol (TENORMIN) 25 MG tablet Take 0.5 tablets (12.5 mg total) by mouth daily. May take an extra 1/2 tablet for breakthrough afib 120 tablet 3   azelastine (ASTELIN) 0.1 % nasal spray Place 2 sprays into both nostrils 2 (two) times daily. Use in each nostril as directed 30 mL 6   Blood Glucose Monitoring Suppl (ACCU-CHEK GUIDE ME) w/Device KIT USE TO CHECK GLUCOSE ONCE DAILY TO TWICE DAILY 1 kit 0   calcium carbonate (TUMS EX) 750 MG chewable tablet Chew 2 tablets by mouth daily as needed  for heartburn.     Cetirizine HCl (ZYRTEC ALLERGY PO) Take by mouth.     ciclopirox (PENLAC) 8 % solution Apply topically at bedtime. Apply over nail and surrounding skin. Apply daily over previous coat. After seven (7) days, may remove with alcohol and continue cycle. 6.6 mL 2   cyclobenzaprine (FLEXERIL) 10 MG tablet Take 2.5 mg by mouth daily as needed (pain level 8 or greater).       Docusate Sodium (STOOL SOFTENER LAXATIVE PO) Take by mouth.     ELIQUIS 5 MG TABS tablet Take 1 tablet by mouth twice daily 180 tablet 0   flecainide (TAMBOCOR) 100 MG tablet Take 1 tablet (100 mg total) by mouth 2 (two) times daily. 180 tablet 3   fluticasone (FLONASE) 50 MCG/ACT nasal spray Use 2 spray(s) in each nostril once daily 48 g 0   glucose blood (ACCU-CHEK GUIDE) test strip Use to test blood sugars 1-2 times daily. 100 each 12   hydrochlorothiazide (HYDRODIURIL) 25 MG tablet Take 1 tablet (25 mg total) by mouth daily. 90 tablet 2   HYDROcodone-acetaminophen (NORCO/VICODIN) 5-325 MG tablet Take 0.5 tablets by mouth daily as needed (pain level 8 or greater). 30 tablet 0   hydrocortisone 2.5 % ointment APPLY  OINTMENT TOPICALLY AS NEEDED 58 g 0   hydrOXYzine (ATARAX) 25 MG tablet TAKE 1 TABLET BY MOUTH THREE TIMES DAILY AS NEEDED FOR ANXIETY 90 tablet 5   Menthol-Zinc Oxide (CALMOSEPTINE) 0.44-20.6 % OINT Apply topically.     miconazole (REMEDY PHYTOPLEX ANTIFUNGAL) 2 % powder Apply topically as needed for itching.     Multiple Vitamins-Minerals (MULTI-VITAMIN GUMMIES PO) Take by mouth as needed.     omeprazole (PRILOSEC) 40 MG capsule Take 40 mg by mouth 2 (two) times daily.     Polyethylene Glycol 3350 (MIRALAX PO) Take by mouth as needed.     potassium chloride (KLOR-CON) 10 MEQ tablet Take 2 tablets by mouth once daily 180 tablet 0   pravastatin (PRAVACHOL) 20 MG tablet Take by mouth twice a week 27 tablet 3   Simethicone (GAS-X PO) Take by mouth.     trolamine salicylate (ASPERCREME) 10 % cream Apply 1 application topically as needed for muscle pain.     No current facility-administered medications on file prior to visit.    Past Medical History:  Diagnosis Date   Abdominal pain, epigastric 12/28/2008   Centricity Description: ABDOMINAL PAIN, EPIGASTRIC Qualifier: Diagnosis of  By: Misty Stanley CMA Duncan Dull), [name]   Centricity Description: ABDOMINAL PAIN-EPIGASTRIC Qualifier:  Diagnosis of  By: Myrtie Hawk, [name]    Allergic rhinitis 09/16/2013   Allergy    Anemia    Anxiety    Anxiety disorder, unspecified 07/06/2017   Arthritis    Blood in stool    BMI 45.0-49.9, adult (HCC) 07/26/2016   Class 3 obesity with serious comorbidity and body mass index (BMI) of 45.0 to 49.9 in adult 03/07/2017   DDD (degenerative disc disease), lumbar    Diabetes mellitus without complication (HCC)    DIVERTICULOSIS-COLON 12/28/2008   Qualifier: Diagnosis of  By: Monica Becton PA-c, [name]    GERD 12/28/2008   Qualifier: Diagnosis of  By: Monica Becton PA-c, [name]    GERD (gastroesophageal reflux disease)    Headache    Hyperglycemia 03/17/2014   Hypertension    Hypokalemia 07/26/2016   IBS (irritable bowel syndrome)-C-D 07/26/2016   Obesity    Other and unspecified hyperlipidemia 03/17/2014   PERSONAL HX COLONIC POLYPS 12/28/2008  Qualifier: Diagnosis of  By: Myrtie Hawk, [name]    Reflux esophagitis 12/28/2008   Qualifier: Diagnosis of  By: Misty Stanley CMA Duncan Dull), Marchelle Folks     S/P lumbar spinal fusion 07/02/2012   Tubulovillous adenoma of colon 03/13/07   Allergies  Allergen Reactions   Crestor [Rosuvastatin]    Erythromycin     rash   Jublia [Efinaconazole] Hives    Redness/burning around nail   Lansoprazole    Penicillins     rash   Potassium-Containing Compounds Other (See Comments)    Stomach upset, can take pills, but not the packets    Robaxin [Methocarbamol]     Messed up stomach   Sulfonamide Derivatives     rash   Tea Tree Oil     Redness, burning (localized)   Trental [Pentoxifylline Er]     Bad headache    Social History   Socioeconomic History   Marital status: Widowed    Spouse name: Not on file   Number of children: Not on file   Years of education: Not on file   Highest education level: Some college, no degree  Occupational History   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   Social Determinants of Health   Financial Resource Strain: Low Risk  (08/14/2022)   Overall Financial Resource Strain (CARDIA)    Difficulty of Paying Living Expenses: Not hard at all  Food Insecurity: No Food Insecurity (08/14/2022)   Hunger Vital Sign    Worried About Running Out of Food in the Last Year: Never true    Ran Out of Food in the Last Year: Never true  Transportation Needs: No Transportation Needs (08/14/2022)   PRAPARE - Administrator, Civil Service (Medical): No    Lack of Transportation (Non-Medical): No  Physical Activity: Insufficiently Active (08/11/2022)   Exercise Vital Sign    Days of Exercise per Week: 5 days    Minutes of Exercise per Session: 20 min  Stress: No Stress Concern Present (08/11/2022)   [employer] of Occupational Health - Occupational Stress Questionnaire    Feeling of Stress : Not at all  Social Connections: Moderately Integrated (08/11/2022)   Social Connection and Isolation Panel [NHANES]    Frequency of Communication with Friends and Family: More than three times a week    Frequency of Social Gatherings with Friends and Family: More than three times a week    Attends Religious Services: More than 4 times per year    Active Member of [employer] or Organizations: Yes    Attends Banker Meetings: More than 4 times per year    Marital Status: Widowed   Today's Vitals   02/27/23 1224  BP: 120/70  Pulse: 60  Resp:  16  Temp:  98.8 F (37.1 C)  TempSrc: Oral  SpO2: 97%  Weight: 243 lb (110.2 kg)  Height: 5\' 1"  (1.549 m)   Body mass index is 45.91 kg/m.  Physical Exam Vitals and nursing note reviewed.  Constitutional:      General: She is not in acute distress.    Appearance: She is well-developed.  HENT:     Head: Normocephalic and atraumatic.     Mouth/Throat:     Mouth: Mucous membranes are moist.     Pharynx: Oropharynx is clear.  Eyes:     Conjunctiva/sclera: Conjunctivae normal.  Cardiovascular:     Rate and Rhythm: Normal rate and regular rhythm.     Pulses:          Posterior tibial pulses are 2+ on the right side and 2+ on the left side.     Heart sounds: No murmur heard.    Comments: Trace pitting LE edema, bilateral. Pulmonary:     Effort: Pulmonary effort is normal. No respiratory distress.     Breath sounds: Normal breath sounds.  Abdominal:     Palpations: Abdomen is soft. There is no hepatomegaly or mass.     Tenderness: There is no abdominal tenderness.  Lymphadenopathy:     Cervical: No cervical adenopathy.  Skin:    General: Skin is warm.     Findings: No erythema or rash.  Neurological:     General: No focal deficit present.     Mental Status: She is alert and oriented to person, place, and time.     Cranial Nerves: No cranial nerve deficit.     Comments: Antalgic gait assisted with a cane.  Psychiatric:        Mood and Affect: Mood and affect normal.    ASSESSMENT AND PLAN: There are no diagnoses linked to this encounter.  No follow-ups on file.  Andra Heslin G. [place], MD  [facility]. [place] office.

## 2023-02-27 ENCOUNTER — Encounter: Payer: Self-pay | Admitting: Family Medicine

## 2023-02-27 ENCOUNTER — Ambulatory Visit: Payer: Medicare PPO | Admitting: Family Medicine

## 2023-02-27 VITALS — BP 120/70 | HR 60 | Temp 98.8°F | Resp 16 | Ht 61.0 in | Wt 243.0 lb

## 2023-02-27 DIAGNOSIS — R197 Diarrhea, unspecified: Secondary | ICD-10-CM

## 2023-02-27 DIAGNOSIS — E876 Hypokalemia: Secondary | ICD-10-CM | POA: Diagnosis not present

## 2023-02-27 DIAGNOSIS — K582 Mixed irritable bowel syndrome: Secondary | ICD-10-CM | POA: Diagnosis not present

## 2023-02-27 DIAGNOSIS — K219 Gastro-esophageal reflux disease without esophagitis: Secondary | ICD-10-CM | POA: Diagnosis not present

## 2023-02-27 DIAGNOSIS — E1169 Type 2 diabetes mellitus with other specified complication: Secondary | ICD-10-CM | POA: Diagnosis not present

## 2023-02-27 DIAGNOSIS — I10 Essential (primary) hypertension: Secondary | ICD-10-CM | POA: Diagnosis not present

## 2023-02-27 LAB — BASIC METABOLIC PANEL
BUN: 17 mg/dL (ref 6–23)
CO2: 29 mEq/L (ref 19–32)
Calcium: 10 mg/dL (ref 8.4–10.5)
Chloride: 102 mEq/L (ref 96–112)
Creatinine, Ser: 0.96 mg/dL (ref 0.40–1.20)
GFR: 60.33 mL/min (ref >60.00)
Glucose, Bld: 92 mg/dL (ref 70–99)
Potassium: 3.6 mEq/L (ref 3.5–5.1)
Sodium: 141 mEq/L (ref 135–145)

## 2023-02-27 LAB — CBC
HCT: 38.3 % (ref 36.0–46.0)
Hemoglobin: 12.3 g/dL (ref 12.0–15.0)
MCHC: 32.1 g/dL (ref 30.0–36.0)
MCV: 82.7 fl (ref 78.0–100.0)
Platelets: 271 10*3/uL (ref 150.0–400.0)
RBC: 4.64 Mil/uL (ref 3.87–5.11)
RDW: 16.6 % — ABNORMAL HIGH (ref 11.5–15.5)
WBC: 6 10*3/uL (ref 4.0–10.5)

## 2023-02-27 LAB — HEMOGLOBIN A1C: Hgb A1c MFr Bld: 6.4 % (ref 4.6–6.5)

## 2023-02-27 LAB — TSH: TSH: 2.33 u[IU]/mL (ref 0.35–5.50)

## 2023-02-27 NOTE — Patient Instructions (Addendum)
A few things to remember from today's visit:  Diarrhea, unspecified type - Plan: Stool culture, Ova and parasite examination, CBC, Basic metabolic panel, TSH  Type 2 diabetes mellitus with other specified complication, without long-term current use of insulin (HCC) - Plan: Basic metabolic panel, Hemoglobin A1c, Microalbumin / creatinine urine ratio  Essential hypertension  Gastroesophageal reflux disease, unspecified whether esophagitis present  If you need refills for medications you take chronically, please call your pharmacy. Do not use My Chart to request refills or for acute issues that need immediate attention. If you send a my chart message, it may take a few days to be addressed, specially if I am not in the office.  Please be sure medication list is accurate. If a new problem present, please set up appointment sooner than planned today.

## 2023-02-28 DIAGNOSIS — R197 Diarrhea, unspecified: Secondary | ICD-10-CM | POA: Diagnosis not present

## 2023-02-28 LAB — MICROALBUMIN / CREATININE URINE RATIO
Creatinine,U: 89.7 mg/dL
Microalb Creat Ratio: 2.5 mg/g (ref 0.0–30.0)
Microalb, Ur: 2.2 mg/dL — ABNORMAL HIGH (ref 0.0–1.9)

## 2023-03-01 ENCOUNTER — Telehealth: Payer: Self-pay | Admitting: Family Medicine

## 2023-03-01 NOTE — Telephone Encounter (Signed)
Pt is calling and would like blood work results 

## 2023-03-02 LAB — STOOL CULTURE: E coli, Shiga toxin Assay: NEGATIVE

## 2023-03-02 NOTE — Telephone Encounter (Signed)
Results haven't been reviewed by PCP yet.

## 2023-03-04 LAB — STOOL CULTURE: E coli, Shiga toxin Assay: NEGATIVE

## 2023-03-04 NOTE — Assessment & Plan Note (Signed)
BP adequately controlled. Continue HCTZ 25 mg daily,Atenolol 50 mg daily,and Amlodipine 5 mg daily. Low salt diet recommended.

## 2023-03-04 NOTE — Assessment & Plan Note (Addendum)
We discussed Dx,prognosis, and treatment options. Diarrhea she is reporting today could be related to this problem. Stressed the importance of avoiding foods that can exacerbate problem, it would be ideal if she could cook her meals. Take miralax and fiber supplementation when having episodes of constipation,otherwise low residuo diet.

## 2023-03-04 NOTE — Assessment & Plan Note (Signed)
Having mild epigastric burning sensation but no acid reflux or heartburn. EGD: Moderate diffuse gastritis in 12/2008. She is already on Omeprazole 40 mg bid. For now recommend GERD precautions for now. If problem gets worse, may need to follow with GI.

## 2023-03-04 NOTE — Assessment & Plan Note (Signed)
HgA1C has been at goal, last one 6.0 in 07/2022. Continue non pharmacologic treatment. Annual eye exam, periodic dental and foot care to continue. F/U in 5-6 months.

## 2023-03-04 NOTE — Assessment & Plan Note (Signed)
Continue KLOR 10 meq 2 tabs daily, we could try to change to KLOR 20 meq daily. Further recommendations according to BMP result.

## 2023-03-05 LAB — STOOL CULTURE

## 2023-03-07 LAB — OVA AND PARASITE EXAMINATION
CONCENTRATE RESULT:: NONE SEEN
MICRO NUMBER:: 14990540
SPECIMEN QUALITY:: ADEQUATE
TRICHROME RESULT:: NONE SEEN

## 2023-03-22 ENCOUNTER — Ambulatory Visit (INDEPENDENT_AMBULATORY_CARE_PROVIDER_SITE_OTHER): Payer: Medicare PPO | Admitting: Podiatry

## 2023-03-22 DIAGNOSIS — M79674 Pain in right toe(s): Secondary | ICD-10-CM

## 2023-03-22 DIAGNOSIS — B351 Tinea unguium: Secondary | ICD-10-CM | POA: Diagnosis not present

## 2023-03-22 DIAGNOSIS — Z7901 Long term (current) use of anticoagulants: Secondary | ICD-10-CM

## 2023-03-22 DIAGNOSIS — M79675 Pain in left toe(s): Secondary | ICD-10-CM | POA: Diagnosis not present

## 2023-03-22 MED ORDER — CICLOPIROX 8 % EX SOLN: Freq: Every day | CUTANEOUS | 2 refills | Status: AC

## 2023-03-24 NOTE — Progress Notes (Signed)
Subjective: Chief Complaint  Patient presents with   Nail Problem    Diabetic Foot Care     72 year old female presents the office today for thick, elongated toenails that she is unable to trim herself.  Since I saw her last she had a partial nail avulsion left hallux with Dr. Charlsie Merles.  Remaining nails are still thickened elongated, discomfort.  No swelling, redness or any drainage of the toenail sites at this time.  Last A1c was 6.4 on 02/27/2023 [place], [name], MD Last seen 02/27/2023  On Eliquis  Objective: AAO x3, NAD DP/PT pulses palpable bilaterally, CRT less than 3 seconds Nails are hypertrophic, dystrophic, brittle, discolored, elongated 10. No surrounding redness or drainage. Tenderness nails 1-5 bilaterally.  The prescription left hallux appears to have healed.  There is no edema, erythema, drainage or pus or signs of infection today. No pain with calf compression, swelling, warmth, erythema  Assessment: Symptomatic onychomycosis  Plan: -All treatment options discussed with the patient including all alternatives, risks, complications.  -Sharply debrided the nails x 10 without any complications or bleeding.  Refilled ciclopirox to help with the left hallux nail.  She can use it on the other nails as well however given the thickening is likely not can work as well but she could try this in combination with the urea nail gel to help with the thickening -Daily foot inspection  Return in about 3 months (around 06/22/2023).  Vivi Barrack DPM

## 2023-04-03 NOTE — Progress Notes (Signed)
ACUTE VISIT Chief Complaint  Patient presents with   Flank Pain    Pain and pressure when urinating    HPI: [name] is a 72 y.o. female with PMHx significant for DM II,atrial fib on chronic anticoagulation, HTN,HLD, IBS, and chronic pain here today complaining of urinary symptoms.  She reports experiencing pressure and pain with urination that started two and a half weeks ago and has been worsening progressively.  She describes the pain as sharp without burning. Additionally, she has developed new suprapubic pain, which she reports as new and also aggravated by urination. She is not sure about urgency or frequency.  Urinary Tract Infection  The current episode started 1 to 4 weeks ago. The problem has been gradually worsening. The quality of the pain is described as shooting. The pain is moderate. There has been no fever. She is Not sexually active. There is No history of pyelonephritis. Associated symptoms include sweats. Pertinent negatives include no chills, flank pain, frequency, hesitancy, nausea or vomiting. She has tried nothing for the symptoms.   In an attempt to alleviate her urinary symptoms, the patient has been consuming cranberry juice and taking her pain medication, but with limited success.  For the past three weeks, she has been experiencing excessive sweating. Reporting recent episode of dehydration and having difficulty maintaining adequate fluid intake.  IBS, diarrhea and constipation, the former one has contributed to her recent episode of dehydration.   Chronic lower back pain, radiated to LE's. She follows with ortho and on chronic opioid treatment. She reports persistent perineal numbness and difficulty with bladder and bowel control, necessitating the use of pull-ups and timing her bathroom visits to avoid accidents. According to pt, symptoms started after an epidural injection.  She has previously tried floor exercises and physical therapy for  her symptoms without improvement. She denies any vaginal bleeding, vaginal discharge, or changes in bowel habits.  Review of Systems  Constitutional:  Positive for fatigue. Negative for chills and fever.  HENT:  Negative for mouth sores and sore throat.   Respiratory:  Negative for cough, shortness of breath and wheezing.   Cardiovascular:  Positive for palpitations. Negative for chest pain.  Gastrointestinal:  Negative for nausea and vomiting.  Genitourinary:  Negative for decreased urine volume, flank pain, frequency and hesitancy.  Musculoskeletal:  Positive for arthralgias, back pain and gait problem.  Skin:  Negative for rash.  Neurological:  Negative for syncope and facial asymmetry.  Psychiatric/Behavioral:  Negative for confusion. The patient is nervous/anxious.   See other pertinent positives and negatives in HPI.  Current Outpatient Medications on File Prior to Visit  Medication Sig Dispense Refill   Accu-Chek Softclix Lancets lancets Use to check blood sugars 1-2 times daily. 100 each 12   amLODipine (NORVASC) 5 MG tablet Take 1 tablet by mouth once daily 90 tablet 2   Aspirin-Salicylamide-Caffeine (BC FAST PAIN RELIEF) 650-195-33.3 MG PACK Take 1 tablet by mouth as needed.     atenolol (TENORMIN) 25 MG tablet Take 0.5 tablets (12.5 mg total) by mouth daily. May take an extra 1/2 tablet for breakthrough afib 120 tablet 3   azelastine (ASTELIN) 0.1 % nasal spray Place 2 sprays into both nostrils 2 (two) times daily. Use in each nostril as directed 30 mL 6   Blood Glucose Monitoring Suppl (ACCU-CHEK GUIDE ME) w/Device KIT USE TO CHECK GLUCOSE ONCE DAILY TO TWICE DAILY 1 kit 0   calcium carbonate (TUMS EX) 750 MG chewable tablet Chew 2  tablets by mouth daily as needed for heartburn.     Cetirizine HCl (ZYRTEC ALLERGY PO) Take by mouth.     ciclopirox (PENLAC) 8 % solution Apply topically at bedtime. Apply over nail and surrounding skin. Apply daily over previous coat. After seven (7)  days, may remove with alcohol and continue cycle. 6.6 mL 2   cyclobenzaprine (FLEXERIL) 10 MG tablet Take 2.5 mg by mouth daily as needed (pain level 8 or greater).      Docusate Sodium (STOOL SOFTENER LAXATIVE PO) Take by mouth.     ELIQUIS 5 MG TABS tablet Take 1 tablet by mouth twice daily 180 tablet 0   flecainide (TAMBOCOR) 100 MG tablet Take 1 tablet (100 mg total) by mouth 2 (two) times daily. 180 tablet 3   fluticasone (FLONASE) 50 MCG/ACT nasal spray Use 2 spray(s) in each nostril once daily 48 g 0   glucose blood (ACCU-CHEK GUIDE) test strip Use to test blood sugars 1-2 times daily. 100 each 12   hydrochlorothiazide (HYDRODIURIL) 25 MG tablet Take 1 tablet (25 mg total) by mouth daily. 90 tablet 2   HYDROcodone-acetaminophen (NORCO/VICODIN) 5-325 MG tablet Take 0.5 tablets by mouth daily as needed (pain level 8 or greater). 30 tablet 0   hydrocortisone 2.5 % ointment APPLY  OINTMENT TOPICALLY AS NEEDED 58 g 0   hydrOXYzine (ATARAX) 25 MG tablet TAKE 1 TABLET BY MOUTH THREE TIMES DAILY AS NEEDED FOR ANXIETY 90 tablet 5   Menthol-Zinc Oxide (CALMOSEPTINE) 0.44-20.6 % OINT Apply topically.     miconazole (REMEDY PHYTOPLEX ANTIFUNGAL) 2 % powder Apply topically as needed for itching.     Multiple Vitamins-Minerals (MULTI-VITAMIN GUMMIES PO) Take by mouth as needed.     omeprazole (PRILOSEC) 40 MG capsule Take 40 mg by mouth 2 (two) times daily.     Polyethylene Glycol 3350 (MIRALAX PO) Take by mouth as needed.     potassium chloride (KLOR-CON) 10 MEQ tablet Take 2 tablets by mouth once daily 180 tablet 0   pravastatin (PRAVACHOL) 20 MG tablet Take by mouth twice a week 27 tablet 3   Simethicone (GAS-X PO) Take by mouth.     trolamine salicylate (ASPERCREME) 10 % cream Apply 1 application topically as needed for muscle pain.     No current facility-administered medications on file prior to visit.    Past Medical History:  Diagnosis Date   Abdominal pain, epigastric 12/28/2008    Centricity Description: ABDOMINAL PAIN, EPIGASTRIC Qualifier: Diagnosis of  By: Misty Stanley CMA Duncan Dull), [name]   Centricity Description: ABDOMINAL PAIN-EPIGASTRIC Qualifier: Diagnosis of  By: Myrtie Hawk, [name]    Allergic rhinitis 09/16/2013   Allergy    Anemia    Anxiety    Anxiety disorder, unspecified 07/06/2017   Arthritis    Blood in stool    BMI 45.0-49.9, adult (HCC) 07/26/2016   Class 3 obesity with serious comorbidity and body mass index (BMI) of 45.0 to 49.9 in adult 03/07/2017   DDD (degenerative disc disease), lumbar    Diabetes mellitus without complication (HCC)    DIVERTICULOSIS-COLON 12/28/2008   Qualifier: Diagnosis of  By: Monica Becton PA-c, [name]    GERD 12/28/2008   Qualifier: Diagnosis of  By: Monica Becton PA-c, [name]    GERD (gastroesophageal reflux disease)    Headache    Hyperglycemia 03/17/2014   Hypertension    Hypokalemia 07/26/2016   IBS (irritable bowel syndrome)-C-D 07/26/2016   Obesity    Other and unspecified hyperlipidemia 03/17/2014   PERSONAL  HX COLONIC POLYPS 12/28/2008   Qualifier: Diagnosis of  By: Monica Becton PA-c, [name]    Reflux esophagitis 12/28/2008   Qualifier: Diagnosis of  By: Misty Stanley CMA Duncan Dull), Marchelle Folks     S/P lumbar spinal fusion 07/02/2012   Tubulovillous adenoma of colon 05/05/07   Allergies  Allergen Reactions   Crestor [Rosuvastatin]    Erythromycin     rash   Jublia [Efinaconazole] Hives    Redness/burning around nail   Lansoprazole    Penicillins     rash   Potassium-Containing Compounds Other (See Comments)    Stomach upset, can take pills, but not the packets    Robaxin [Methocarbamol]     Messed up stomach   Sulfonamide Derivatives     rash   Tea Tree Oil     Redness, burning (localized)   Trental [Pentoxifylline Er]     Bad headache    Social History   Socioeconomic History   Marital status: Widowed    Spouse name: Not on file   Number of children: Not on file   Years of education: Not on file   Highest  education level: Some college, no degree  Occupational History   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   Social Determinants of Health   Financial Resource Strain: Low Risk  (08/14/2022)   Overall Financial Resource Strain (CARDIA)    Difficulty of Paying Living Expenses: Not hard at all  Food Insecurity: No Food Insecurity (08/14/2022)   Hunger Vital Sign    Worried About Running Out of Food in the Last Year: Never true    Ran Out of Food in the Last Year: Never true  Transportation Needs: No Transportation Needs (08/14/2022)   PRAPARE - Administrator, Civil Service (Medical): No    Lack of Transportation (Non-Medical): No  Physical Activity: Insufficiently Active (08/11/2022)   Exercise Vital Sign    Days of Exercise per Week: 5 days    Minutes of Exercise per Session: 20 min  Stress: No Stress Concern Present (08/11/2022)   [employer] of Occupational Health - Occupational Stress Questionnaire    Feeling of Stress : Not at all  Social Connections: Moderately Integrated (08/11/2022)   Social Connection and Isolation Panel [NHANES]    Frequency of Communication with Friends and Family: More than three times a week    Frequency of Social Gatherings with Friends and Family: More than three times a week    Attends Religious Services: More than 4 times per year    Active Member of [employer] or Organizations:  Yes    Attends Banker Meetings: More than 4 times per year    Marital Status: Widowed   Vitals:   04/04/23 1021  BP: 126/70  Pulse: 84  Resp: 16  Temp: 98.1 F (36.7 C)  SpO2: 97%   Body mass index is 46.67 kg/m.  Physical Exam Vitals and nursing note reviewed.  Constitutional:      General: She is not in acute distress.    Appearance: She is well-developed.  HENT:     Head: Normocephalic and atraumatic.     Mouth/Throat:     Mouth: Mucous membranes are moist.  Eyes:     Conjunctiva/sclera: Conjunctivae normal.  Cardiovascular:     Rate and Rhythm: Normal rate and regular rhythm.     Heart sounds: No murmur heard. Pulmonary:     Effort: Pulmonary effort is normal. No respiratory distress.     Breath sounds: Normal breath sounds.  Abdominal:     Palpations: Abdomen is soft. There is no mass.     Tenderness: There is no abdominal tenderness. There is no right CVA tenderness or left CVA tenderness.  Skin:    General: Skin is warm.     Findings: No erythema.  Neurological:     Mental Status: She is alert and oriented to person, place, and time.     Comments: Antalgic, unstable gait assisted with a cane.  Psychiatric:        Mood and Affect: Affect normal. Mood is anxious.    ASSESSMENT AND PLAN:  Ms. Garczynski was seen for suprapubic pain and dysuria.  Dysuria We discussed possible etiologies. Urine dipstick today with positive protein, traces of leuk. Will follow Ucx.  -     POCT Urinalysis Dipstick (Automated) -     Urine Culture; Future  Suprapubic abdominal pain IBS can be a contributing factor. Examination today does not suggest a serious process, so I do not think imaging is needed. Instructed about warning signs.  Saddle anesthesia Chronic with associated bladder and bowel dysfunction. No many options to treat problem at this time and she is not a good candidate for surgical procedure due to co morbilities. She is not interested in  Gabapentin but would like to try Duloxetine.  -     DULoxetine HCl; Take 1 capsule (20 mg total) by mouth daily.  Dispense: 60 capsule; Refill: 1  Urinary tract infection without hematuria, site unspecified -     Nitrofurantoin Monohyd Macro; Take 1 capsule (100 mg total) by mouth 2 (two) times daily for 5 days.  Dispense: 10 capsule; Refill: 0  Lumbar radiculopathy Duloxetine low dose, 20 mg daily started today. In 4 weeks we can try to increase dose to 40 mg. We discussed some side effects. Will also help with anxiety. Continue following with ortho.  -     DULoxetine HCl; Take 1 capsule (20 mg total) by mouth daily.  Dispense: 60 capsule; Refill: 1  Return in about 3 months (around 07/05/2023) for chronic problems.  Ahmoni Edge G. [place], MD  [facility]. [place] office.

## 2023-04-04 ENCOUNTER — Ambulatory Visit: Payer: Medicare PPO | Admitting: Family Medicine

## 2023-04-04 ENCOUNTER — Encounter: Payer: Self-pay | Admitting: Family Medicine

## 2023-04-04 VITALS — BP 126/70 | HR 84 | Temp 98.1°F | Resp 16 | Ht 61.0 in | Wt 247.0 lb

## 2023-04-04 DIAGNOSIS — R300 Dysuria: Secondary | ICD-10-CM | POA: Diagnosis not present

## 2023-04-04 DIAGNOSIS — R102 Pelvic and perineal pain: Secondary | ICD-10-CM

## 2023-04-04 DIAGNOSIS — R200 Anesthesia of skin: Secondary | ICD-10-CM

## 2023-04-04 DIAGNOSIS — M5416 Radiculopathy, lumbar region: Secondary | ICD-10-CM

## 2023-04-04 DIAGNOSIS — N390 Urinary tract infection, site not specified: Secondary | ICD-10-CM

## 2023-04-04 LAB — POC URINALSYSI DIPSTICK (AUTOMATED)
Bilirubin, UA: POSITIVE
Blood, UA: NEGATIVE
Glucose, UA: NEGATIVE
Ketones, UA: NEGATIVE
Nitrite, UA: NEGATIVE
Protein, UA: POSITIVE — AB
Spec Grav, UA: 1.025 (ref 1.010–1.025)
Urobilinogen, UA: 0.2 E.U./dL
pH, UA: 5.5 (ref 5.0–8.0)

## 2023-04-04 MED ORDER — NITROFURANTOIN MONOHYD MACRO 100 MG PO CAPS
100.0000 mg | ORAL_CAPSULE | Freq: Two times a day (BID) | ORAL | 0 refills | Status: AC
Start: 2023-04-04 — End: 2023-04-09

## 2023-04-04 MED ORDER — DULOXETINE HCL 20 MG PO CPEP
20.0000 mg | ORAL_CAPSULE | Freq: Every day | ORAL | 1 refills | Status: DC
Start: 2023-04-04 — End: 2023-07-04

## 2023-04-04 NOTE — Patient Instructions (Addendum)
A few things to remember from today's visit:  Dysuria - Plan: POCT Urinalysis Dipstick (Automated), Culture, Urine  Suprapubic abdominal pain  Saddle anesthesia - Plan: DULoxetine (CYMBALTA) 20 MG capsule  Urinary tract infection without hematuria, site unspecified - Plan: nitrofurantoin, macrocrystal-monohydrate, (MACROBID) 100 MG capsule  Lumbar radiculopathy - Plan: DULoxetine (CYMBALTA) 20 MG capsule   Adequate fluid intake, avoid holding urine for long hours, and over the counter Vit C OR cranberry capsules might help.  Today we will treat empirically with antibiotic, which we might need to change when urine culture comes back depending of bacteria susceptibility.  Seek immediate medical attention if severe abdominal pain, vomiting, fever/chills, or worsening symptoms.  Today we are also starting Duloxetine 20 mg to see if it helps with back pain and numbness. Start 20 mg daily and increase to 40 mg in 4 weeks if well tolerated.   If you need refills for medications you take chronically, please call your pharmacy. Do not use My Chart to request refills or for acute issues that need immediate attention. If you send a my chart message, it may take a few days to be addressed, specially if I am not in the office.  Please be sure medication list is accurate. If a new problem present, please set up appointment sooner than planned today.

## 2023-04-06 LAB — URINE CULTURE
MICRO NUMBER:: 15130234
SPECIMEN QUALITY:: ADEQUATE

## 2023-05-01 ENCOUNTER — Other Ambulatory Visit: Payer: Self-pay | Admitting: Family Medicine

## 2023-05-01 DIAGNOSIS — E876 Hypokalemia: Secondary | ICD-10-CM

## 2023-05-10 ENCOUNTER — Encounter (HOSPITAL_COMMUNITY): Payer: Self-pay | Admitting: Physician Assistant

## 2023-05-10 ENCOUNTER — Ambulatory Visit (HOSPITAL_COMMUNITY)
Admission: RE | Admit: 2023-05-10 | Discharge: 2023-05-10 | Disposition: A | Discharge status: Home / Self Care | Payer: Medicare PPO | Source: Ambulatory Visit | Attending: Physician Assistant | Admitting: Physician Assistant

## 2023-05-10 VITALS — BP 124/78 | HR 56 | Ht 61.0 in | Wt 246.2 lb

## 2023-05-10 DIAGNOSIS — I480 Paroxysmal atrial fibrillation: Secondary | ICD-10-CM | POA: Insufficient documentation

## 2023-05-10 DIAGNOSIS — E669 Obesity, unspecified: Secondary | ICD-10-CM | POA: Diagnosis not present

## 2023-05-10 DIAGNOSIS — Z6841 Body Mass Index (BMI) 40.0 and over, adult: Secondary | ICD-10-CM | POA: Diagnosis not present

## 2023-05-10 DIAGNOSIS — I484 Atypical atrial flutter: Secondary | ICD-10-CM | POA: Insufficient documentation

## 2023-05-10 DIAGNOSIS — E785 Hyperlipidemia, unspecified: Secondary | ICD-10-CM | POA: Insufficient documentation

## 2023-05-10 DIAGNOSIS — Z5181 Encounter for therapeutic drug level monitoring: Secondary | ICD-10-CM

## 2023-05-10 DIAGNOSIS — I10 Essential (primary) hypertension: Secondary | ICD-10-CM | POA: Diagnosis not present

## 2023-05-10 DIAGNOSIS — Z7901 Long term (current) use of anticoagulants: Secondary | ICD-10-CM | POA: Insufficient documentation

## 2023-05-10 DIAGNOSIS — Z79899 Other long term (current) drug therapy: Secondary | ICD-10-CM | POA: Diagnosis not present

## 2023-05-10 DIAGNOSIS — D6869 Other thrombophilia: Secondary | ICD-10-CM | POA: Insufficient documentation

## 2023-05-10 NOTE — Progress Notes (Signed)
Primary Care Physician: [name], Megan G, MD Primary Cardiologist: Dr Elease Hashimoto Primary Electrophysiologist: none Referring Physician: Dr Jeanie Cooks [name] is a 72 y.o. female with a history of HTN, HLD, and atrial fibrillation who presents for follow up in the [facility].  The patient was initially diagnosed with atrial fibrillation 04/2020 after presenting to the her PCP office with palpitations. She also wore a 30 day event monitor which showed a 7% afib burden. Patient is on Eliquis for a CHADS2VASC score of 4. Patient reports that recently she has had more episodes of palpitations at rest. She was changed to metoprolol but this did not control her heart rate as well although she was on prednisone at the time. She admits to snoring and daytime somnolence.   On follow up today, patient reports that she has done well since her last visit. She was started on Cymbalta for nerve pain but she did not tolerate this and discontinued it. She will occasionally notice her heart rate in the low 50'Herring but denies presyncope or dizziness. No recent episodes of afib.   Today, she denies symptoms of palpitations, chest pain, orthopnea, PND, dizziness, presyncope, syncope, bleeding, or neurologic sequela. The patient is tolerating medications without difficulties and is otherwise without complaint today.    Atrial Fibrillation Risk Factors:  she does have symptoms or diagnosis of sleep apnea. She declines sleep study. she does not have a history of rheumatic fever. she does not have a history of alcohol use.   Atrial Fibrillation Management history:  Previous antiarrhythmic drugs: flecainide  Previous cardioversions: none Previous ablations: none Anticoagulation history: Eliquis   Past Medical History:  Diagnosis Date   Abdominal pain, epigastric 12/28/2008   Centricity Description: ABDOMINAL PAIN, EPIGASTRIC Qualifier: Diagnosis of  By: Misty Stanley CMA  [name], [name]   Centricity Description: ABDOMINAL PAIN-EPIGASTRIC Qualifier: Diagnosis of  By: [name], [name]    Allergic rhinitis 09/16/2013   Allergy    Anemia    Anxiety    Anxiety disorder, unspecified 07/06/2017   Arthritis    Blood in stool    BMI 45.0-49.9, adult (HCC) 07/26/2016   Class 3 obesity with serious comorbidity and body mass index (BMI) of 45.0 to 49.9 in adult 03/07/2017   DDD (degenerative disc disease), lumbar    Diabetes mellitus without complication (HCC)    DIVERTICULOSIS-COLON 12/28/2008   Qualifier: Diagnosis of  By: Monica Becton PA-c, [name]    GERD 12/28/2008   Qualifier: Diagnosis of  By: Monica Becton PA-c, [name]    GERD (gastroesophageal reflux disease)    Headache    Hyperglycemia 03/17/2014   Hypertension    Hypokalemia 07/26/2016   IBS (irritable bowel syndrome)-C-D 07/26/2016   Obesity    Other and unspecified hyperlipidemia 03/17/2014   PERSONAL HX COLONIC POLYPS 12/28/2008   Qualifier: Diagnosis of  By: Monica Becton PA-c, [name]    Reflux esophagitis 12/28/2008   Qualifier: Diagnosis of  By: Misty Stanley CMA (AAMA), [name]     Herring/P lumbar spinal fusion 07/02/2012   Tubulovillous adenoma of colon 2008    ROS- All systems are reviewed and negative except as per the HPI above.  Physical Exam: Vitals:   05/10/23 0926  BP: 124/78  Pulse: (!) 56  Weight: 111.7 kg  Height: 5\' 1"  (1.549 m)     GEN: Well nourished, well developed in no acute distress NECK: No JVD; No carotid bruits CARDIAC: Regular rate and rhythm, no murmurs, rubs, gallops  RESPIRATORY:  Clear to auscultation without rales, wheezing or rhonchi  ABDOMEN: Soft, non-tender, non-distended EXTREMITIES:  No edema; No deformity    Wt Readings from Last 3 Encounters:  05/10/23 111.7 kg  04/04/23 112 kg  02/27/23 110.2 kg    EKG today demonstrates  SB, NST Vent. rate 56 BPM PR interval 196 ms QRS duration 100 ms QT/QTcB 338/326 ms   Echo 05/11/20 demonstrated   1. Left  ventricular ejection fraction, by estimation, is 60 to 65%. The left ventricle has normal function. The left ventricle has no regional wall motion abnormalities. Left ventricular diastolic parameters were normal.   2. Right ventricular systolic function is normal. The right ventricular  size is normal. There is normal pulmonary artery systolic pressure.   3. The mitral valve is normal in structure. Trivial mitral valve  regurgitation. No evidence of mitral stenosis.   4. The aortic valve is tricuspid. Aortic valve regurgitation is not  visualized. Mild aortic valve sclerosis is present, with no evidence of aortic valve stenosis.   5. The inferior vena cava is normal in size with greater than 50%  respiratory variability, suggesting right atrial pressure of 3 mmHg.   Epic records are reviewed at length today  CHA2DS2-VASc Score = 4  The patient'Herring score is based upon: CHF History: 0 HTN History: 1 Diabetes History: 1 Stroke History: 0 Vascular Disease History: 0 Age Score: 1 Gender Score: 1       ASSESSMENT AND PLAN: Paroxysmal Atrial Fibrillation/atypical atrial flutter The patient'Herring CHA2DS2-VASc score is 4, indicating a 4.8% annual risk of stroke.   Patient appears to be maintaining SR Continue flecainide 100 mg BID Continue Eliquis 5 mg BID Continue atenolol 12.5 mg daily with an extra 12.5 mg for heart racing.  Secondary Hypercoagulable State (ICD10:  D68.69) The patient is at significant risk for stroke/thromboembolism based upon her CHA2DS2-VASc Score of 4.  Continue Apixaban (Eliquis).   Obesity Body mass index is 46.52 kg/m.  Encouraged lifestyle modification  HTN Stable on current regimen    Follow up in the AF clinic in 6 months.    Jorja Loa PA-C [facility] [address] [place], [place] [address] [phone] 05/10/2023 9:58 AM

## 2023-05-19 ENCOUNTER — Other Ambulatory Visit: Payer: Self-pay | Admitting: Family Medicine

## 2023-05-19 DIAGNOSIS — I10 Essential (primary) hypertension: Secondary | ICD-10-CM

## 2023-05-24 ENCOUNTER — Encounter (INDEPENDENT_AMBULATORY_CARE_PROVIDER_SITE_OTHER): Payer: Self-pay

## 2023-06-04 ENCOUNTER — Other Ambulatory Visit: Payer: Self-pay | Admitting: Family Medicine

## 2023-06-16 ENCOUNTER — Other Ambulatory Visit: Payer: Self-pay | Admitting: Family Medicine

## 2023-06-22 ENCOUNTER — Ambulatory Visit (INDEPENDENT_AMBULATORY_CARE_PROVIDER_SITE_OTHER): Payer: Medicare PPO | Admitting: Podiatry

## 2023-06-22 DIAGNOSIS — M79674 Pain in right toe(s): Secondary | ICD-10-CM

## 2023-06-22 DIAGNOSIS — Z7901 Long term (current) use of anticoagulants: Secondary | ICD-10-CM

## 2023-06-22 DIAGNOSIS — B351 Tinea unguium: Secondary | ICD-10-CM | POA: Diagnosis not present

## 2023-06-22 DIAGNOSIS — M79675 Pain in left toe(s): Secondary | ICD-10-CM | POA: Diagnosis not present

## 2023-06-22 NOTE — Progress Notes (Unsigned)
ht

## 2023-06-28 ENCOUNTER — Other Ambulatory Visit: Payer: Self-pay | Admitting: Family Medicine

## 2023-06-28 DIAGNOSIS — I10 Essential (primary) hypertension: Secondary | ICD-10-CM

## 2023-07-03 ENCOUNTER — Other Ambulatory Visit: Payer: Self-pay | Admitting: Family Medicine

## 2023-07-04 ENCOUNTER — Encounter: Payer: Self-pay | Admitting: Family Medicine

## 2023-07-04 ENCOUNTER — Ambulatory Visit: Payer: Medicare PPO | Admitting: Family Medicine

## 2023-07-04 VITALS — BP 128/80 | HR 64 | Temp 98.4°F | Resp 16 | Ht 61.0 in | Wt 248.5 lb

## 2023-07-04 DIAGNOSIS — R062 Wheezing: Secondary | ICD-10-CM

## 2023-07-04 DIAGNOSIS — I10 Essential (primary) hypertension: Secondary | ICD-10-CM | POA: Diagnosis not present

## 2023-07-04 DIAGNOSIS — Z6841 Body Mass Index (BMI) 40.0 and over, adult: Secondary | ICD-10-CM

## 2023-07-04 DIAGNOSIS — Z23 Encounter for immunization: Secondary | ICD-10-CM

## 2023-07-04 DIAGNOSIS — M5416 Radiculopathy, lumbar region: Secondary | ICD-10-CM

## 2023-07-04 DIAGNOSIS — E1169 Type 2 diabetes mellitus with other specified complication: Secondary | ICD-10-CM | POA: Diagnosis not present

## 2023-07-04 LAB — POCT GLYCOSYLATED HEMOGLOBIN (HGB A1C): HbA1c, POC (prediabetic range): 6.2 % (ref 5.7–6.4)

## 2023-07-04 NOTE — Progress Notes (Signed)
HPI: [name] is a 72 y.o. female with a PMHx significant for HTN, HLD, DM II, atrial fib, IBS, chronic pain, and dysuria, who is here today for chronic disease management.  Last seen on 04/04/2023 She will see her cardiologist in 10/2023.   Lumbar radiculopathy with residual saddle anesthesia after epidural injection about a year ago. Last visit recommended Duloxetine. She reports Duloxetine did not work. She took it twice. She felt her atrial fibrillation was worse and she couldn't sleep, so her cardiologist advised her to stop taking it.  She denies associated chest pain,diaphoresis, or SOB.   She also reports that she fell on 9/21, and her ortho ordered her prednisone taper because her left LE was "bruised and swollen", reports that X ray's she had done were negative for fractures. She is on Hydrocodone-Acetaminophen 5-325 mg  -She has reported episodes of wheezing. Reports wheezing and SOB when she walks for a long time (more than 3-4 hours out). She hasn't had these issues when she exercises on the treadmill. This is a stable problem for the last 2 years.  She is no longer using an inhaler, according to pt, she was instructed to discontinue.  She also complains of excessive sweating. She says this often leads to dehydration. She drinks Gatorade to help with this.   Hypertension:She is currently taking Hydrochlorothiazide 25 mg daily,Amlodipine 5 mg daily, and Atenolol 25 mg 1/2 tab daily. HR's at home 50's. She has been checking at home, "good."  Negative for unusual or severe headache, visual changes, exertional chest pain,focal weakness, or edema.  Lab Results  Component Value Date   CREATININE 0.96 02/27/2023   BUN 17 02/27/2023   NA 141 02/27/2023   K 3.6 02/27/2023   CL 102 02/27/2023   CO2 29 02/27/2023   Diabetes Mellitus II: Dx'ed in 2018. She is on non pharmacologic treatment. Exercise: She is walking on a treadmill at home.  Eye exam: She has an  upcoming appointment with optometry in 10/2023 Foot exam:03/2023.  Negative for symptoms of hypoglycemia, polyuria, polydipsia, foot ulcers/trauma Last HgA1C was 6.4 in 02/2023.  Lab Results  Component Value Date   MICROALBUR 2.2 (H) 02/27/2023   Review of Systems  Constitutional:  Negative for chills and fever.  HENT:  Negative for nosebleeds and sore throat.   Respiratory:  Negative for cough.   Gastrointestinal:  Negative for abdominal pain, nausea and vomiting.  Endocrine: Negative for cold intolerance and heat intolerance.  Genitourinary:  Negative for decreased urine volume, dysuria and hematuria.  Musculoskeletal:  Positive for arthralgias, back pain and gait problem.  Neurological:  Negative for syncope and facial asymmetry.  See other pertinent positives and negatives in HPI.  Current Outpatient Medications on File Prior to Visit  Medication Sig Dispense Refill   Accu-Chek Softclix Lancets lancets Use to check blood sugars 1-2 times daily. 100 each 12   amLODipine (NORVASC) 5 MG tablet Take 1 tablet by mouth once daily 90 tablet 2   apixaban (ELIQUIS) 5 MG TABS tablet Take 1 tablet by mouth twice daily 180 tablet 1   Aspirin-Salicylamide-Caffeine (BC FAST PAIN RELIEF) 650-195-33.3 MG PACK Take 1 tablet by mouth as needed.     atenolol (TENORMIN) 25 MG tablet Take 0.5 tablets (12.5 mg total) by mouth daily. May take an extra 1/2 tablet for breakthrough afib 120 tablet 3   azelastine (ASTELIN) 0.1 % nasal spray Place 2 sprays into both nostrils 2 (two) times daily. Use in each nostril  as directed (Patient taking differently: Place 2 sprays into both nostrils as needed. Use in each nostril as directed) 30 mL 6   Blood Glucose Monitoring Suppl (ACCU-CHEK GUIDE ME) w/Device KIT USE TO CHECK GLUCOSE ONCE DAILY TO TWICE DAILY 1 kit 0   calcium carbonate (TUMS EX) 750 MG chewable tablet Chew 2 tablets by mouth daily as needed for heartburn.     Cetirizine HCl (ZYRTEC ALLERGY PO) Take by  mouth as needed.     ciclopirox (PENLAC) 8 % solution Apply topically at bedtime. Apply over nail and surrounding skin. Apply daily over previous coat. After seven (7) days, may remove with alcohol and continue cycle. 6.6 mL 2   cyclobenzaprine (FLEXERIL) 10 MG tablet Take 2.5 mg by mouth daily as needed (pain level 8 or greater).      Docusate Sodium (STOOL SOFTENER LAXATIVE PO) Take by mouth.     flecainide (TAMBOCOR) 100 MG tablet Take 1 tablet (100 mg total) by mouth 2 (two) times daily. 180 tablet 3   fluticasone (FLONASE) 50 MCG/ACT nasal spray Use 2 spray(s) in each nostril once daily 48 g 0   glucose blood (ACCU-CHEK GUIDE) test strip Use to test blood sugars 1-2 times daily. 100 each 12   hydrochlorothiazide (HYDRODIURIL) 25 MG tablet Take 1 tablet by mouth once daily 90 tablet 1   HYDROcodone-acetaminophen (NORCO/VICODIN) 5-325 MG tablet Take 0.5 tablets by mouth daily as needed (pain level 8 or greater). 30 tablet 0   hydrocortisone 2.5 % ointment APPLY  OINTMENT TOPICALLY AS NEEDED 58 g 0   hydrOXYzine (ATARAX) 25 MG tablet TAKE 1 TABLET BY MOUTH THREE TIMES DAILY AS NEEDED FOR ANXIETY 90 tablet 1   Menthol-Zinc Oxide (CALMOSEPTINE) 0.44-20.6 % OINT Apply topically.     Metamucil Fiber CHEW Chew by mouth as needed.     miconazole (REMEDY PHYTOPLEX ANTIFUNGAL) 2 % powder Apply topically as needed for itching.     Multiple Vitamins-Minerals (MULTI-VITAMIN GUMMIES PO) Take by mouth as needed.     omeprazole (PRILOSEC) 40 MG capsule Take 40 mg by mouth 2 (two) times daily.     Polyethylene Glycol 3350 (MIRALAX PO) Take by mouth as needed.     potassium chloride (KLOR-CON) 10 MEQ tablet Take 2 tablets by mouth once daily 180 tablet 0   pravastatin (PRAVACHOL) 20 MG tablet Take by mouth twice a week 27 tablet 3   Simethicone (GAS-X PO) Take by mouth as needed.     trolamine salicylate (ASPERCREME) 10 % cream Apply 1 application topically as needed for muscle pain.     No current  facility-administered medications on file prior to visit.    Past Medical History:  Diagnosis Date   Abdominal pain, epigastric 12/28/2008   Centricity Description: ABDOMINAL PAIN, EPIGASTRIC Qualifier: Diagnosis of  By: Misty Stanley CMA Duncan Dull), [name]   Centricity Description: ABDOMINAL PAIN-EPIGASTRIC Qualifier: Diagnosis of  By: Myrtie Hawk, [name]    Allergic rhinitis 09/16/2013   Allergy    Anemia    Anxiety    Anxiety disorder, unspecified 07/06/2017   Arthritis    Blood in stool    BMI 45.0-49.9, adult (HCC) 07/26/2016   Class 3 obesity with serious comorbidity and body mass index (BMI) of 45.0 to 49.9 in adult 03/07/2017   DDD (degenerative disc disease), lumbar    Diabetes mellitus without complication (HCC)    DIVERTICULOSIS-COLON 12/28/2008   Qualifier: Diagnosis of  By: Myrtie Hawk, [name]    GERD 12/28/2008  Qualifier: Diagnosis of  By: Myrtie Hawk, [name]    GERD (gastroesophageal reflux disease)    Headache    Hyperglycemia 03/17/2014   Hypertension    Hypokalemia 07/26/2016   IBS (irritable bowel syndrome)-C-D 07/26/2016   Obesity    Other and unspecified hyperlipidemia 03/17/2014   PERSONAL HX COLONIC POLYPS 12/28/2008   Qualifier: Diagnosis of  By: Monica Becton PA-c, [name]    Reflux esophagitis 12/28/2008   Qualifier: Diagnosis of  By: Misty Stanley CMA Duncan Dull), Marchelle Folks     S/P lumbar spinal fusion 07/02/2012   Tubulovillous adenoma of colon 07/28/2007   Allergies  Allergen Reactions   Crestor [Rosuvastatin]    Erythromycin     rash   Jublia [Efinaconazole] Hives    Redness/burning around nail   Lansoprazole    Penicillins     rash   Potassium-Containing Compounds Other (See Comments)    Stomach upset, can take pills, but not the packets    Robaxin [Methocarbamol]     Messed up stomach   Sulfonamide Derivatives     rash   Tea Tree Oil     Redness, burning (localized)   Trental [Pentoxifylline Er]     Bad headache    Social History   Socioeconomic  History   Marital status: Widowed    Spouse name: Not on file   Number of children: Not on file   Years of education: Not on file   Highest education level: Some college, no degree  Occupational History   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  Tobacco Use   Smoking status: Former    Current packs/day: 0.00    Average packs/day: 1 pack/day for 38.0 years (38.0 ttl pk-yrs)    Types: Cigarettes    Start date: 09/08/1960    Quit date: 09/08/1998    Years since quitting: 24.8   Smokeless tobacco: Never   Tobacco comments:    Former smoker 06/06/21  Vaping Use   Vaping status: Never Used  Substance and Sexual Activity   Alcohol use: No   Drug use: No   Sexual activity: Not Currently    Comment: Husband passed away in 28-Jul-1999  Other Topics Concern   Not on file  Social History Narrative   Widow, 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   Social Determinants of Health   Financial Resource Strain: Low Risk  (08/14/2022)   Overall Financial Resource Strain (CARDIA)    Difficulty of Paying Living Expenses: Not hard at all  Food Insecurity: No Food Insecurity (08/14/2022)   Hunger Vital Sign    Worried About Running Out of Food in the Last Year: Never true    Ran Out of Food in the Last Year: Never true  Transportation Needs: No Transportation Needs (08/14/2022)   PRAPARE - Administrator, Civil Service (Medical): No    Lack of Transportation (Non-Medical): No  Physical Activity: Insufficiently Active (08/11/2022)   Exercise Vital Sign    Days of Exercise per Week: 5 days    Minutes of Exercise per Session: 20 min  Stress: No Stress Concern Present (08/11/2022)   [employer] of Occupational Health - Occupational Stress Questionnaire    Feeling of Stress : Not at all  Social Connections: Moderately Integrated (08/11/2022)   Social Connection and Isolation Panel [NHANES]    Frequency of Communication with Friends and Family: More than three times a week    Frequency of  Social Gatherings with Friends and Family: More than three times a week  Attends Religious Services: More than 4 times per year    Active Member of Clubs or Organizations: Yes    Attends Banker Meetings: More than 4 times per year    Marital Status: Widowed    Vitals:   07/04/23 1043  BP: 128/80  Pulse: 64  Resp: 16  Temp: 98.4 F (36.9 C)  SpO2: 93%   Wt Readings from Last 3 Encounters:  07/04/23 248 lb 8 oz (112.7 kg)  05/10/23 246 lb 3.2 oz (111.7 kg)  04/04/23 247 lb (112 kg)   Body mass index is 46.95 kg/m.  Physical Exam Vitals and nursing note reviewed.  Constitutional:      General: She is not in acute distress.    Appearance: She is well-developed.  HENT:     Head: Normocephalic and atraumatic.     Mouth/Throat:     Mouth: Mucous membranes are moist.     Pharynx: Oropharynx is clear.  Eyes:     Conjunctiva/sclera: Conjunctivae normal.  Cardiovascular:     Rate and Rhythm: Regular rhythm. Bradycardia present.     Pulses:          Dorsalis pedis pulses are 2+ on the right side and 2+ on the left side.     Heart sounds: No murmur heard.    Comments: HR 56/min. Pulmonary:     Effort: Pulmonary effort is normal. No respiratory distress.     Breath sounds: Normal breath sounds.  Abdominal:     Palpations: Abdomen is soft. There is no hepatomegaly or mass.     Tenderness: There is no abdominal tenderness.  Musculoskeletal:     Right lower leg: 1+ Pitting Edema present.     Left lower leg: 1+ Pitting Edema present.  Lymphadenopathy:     Cervical: No cervical adenopathy.  Skin:    General: Skin is warm.     Findings: No erythema or rash.  Neurological:     General: No focal deficit present.     Mental Status: She is alert and oriented to person, place, and time.     Comments: Antalgic gait assisted with a cane.  Psychiatric:        Mood and Affect: Mood and affect normal.   ASSESSMENT AND PLAN:  [name] was seen today for chronic  disease follow up.   Orders Placed This Encounter  Procedures   POC HgB A1c   Lab Results  Component Value Date   HGBA1C 6.2 07/04/2023   Type 2 diabetes mellitus with other specified complication, without long-term current use of insulin (HCC) Assessment & Plan: HgA1C has been at [facility] went from 6.4 to 6.2. Continue non pharmacologic treatment. Annual eye exam, periodic dental and foot care recommended. F/U in 5-6 months.  Orders: -     POCT glycosylated hemoglobin (Hb A1C)  Essential hypertension Assessment & Plan: BP is adequately controlled. Continue HCTZ 25 mg daily,Atenolol 50 mg daily,and Amlodipine 5 mg daily. Low salt diet to continue. Continue monitoring BP's at home.   Lumbar radiculopathy Assessment & Plan: She did not tolerate Duloxetine. Symptoms are stable. Continue following with ortho and pain management. Fall precautions.   Wheezing Assessment & Plan: Chronic and reported as stable. We discussed possible causes, ? COPD-asthma. No further wirk up for now but is it gets worse pulmonology referral can be considered.   Need for influenza vaccination -     Flu Vaccine Trivalent High Dose (Fluad)  Morbid obesity with BMI of 45.0-49.9, adult (  HCC)  Wt otherwise stable. Due to chronic pain, exercise is challenging but has been walking the treadmill a few times per week. Encouraged consistency with following a healthful diet. Wt loss may also help with back pain.  Return in about 6 months (around 01/01/2024) for chronic problems.  I, [name], acting as a scribe for Megan Yanik [place], MD., have documented all relevant documentation on the behalf of Megan Tauzin [place], MD, as directed by  Megan Sahota [place], MD while in the presence of Megan Karam [place], MD.   I, Megan Loflin [place], MD, have reviewed all documentation for this visit. The documentation on 07/04/23 for the exam, diagnosis, procedures, and orders are all accurate and complete.  Megan Obey G. [place],  MD  [facility]. [place] office.

## 2023-07-04 NOTE — Patient Instructions (Addendum)
A few things to remember from today's visit:  Type 2 diabetes mellitus with other specified complication, without long-term current use of insulin (HCC) - Plan: POC HgB A1c  Essential hypertension  Lumbar radiculopathy  Wheezing  No changes today.  If you need refills for medications you take chronically, please call your pharmacy. Do not use My Chart to request refills or for acute issues that need immediate attention. If you send a my chart message, it may take a few days to be addressed, specially if I am not in the office.  Please be sure medication list is accurate. If a new problem present, please set up appointment sooner than planned today.

## 2023-07-05 NOTE — Assessment & Plan Note (Addendum)
BP is adequately controlled. Continue HCTZ 25 mg daily,Atenolol 50 mg daily,and Amlodipine 5 mg daily. Low salt diet to continue. Continue monitoring BP's at home.

## 2023-07-05 NOTE — Assessment & Plan Note (Signed)
Chronic and reported as stable. We discussed possible causes, ? COPD-asthma. No further wirk up for now but is it gets worse pulmonology referral can be considered.

## 2023-07-05 NOTE — Assessment & Plan Note (Signed)
She did not tolerate Duloxetine. Symptoms are stable. Continue following with ortho and pain management. Fall precautions.

## 2023-07-05 NOTE — Assessment & Plan Note (Signed)
Wt otherwise stable. Due to chronic pain, exercise is challenging but has been walking the treadmill a few times per week. Encouraged consistency with following a healthful diet. Wt loss may also help with back pain.

## 2023-07-05 NOTE — Assessment & Plan Note (Signed)
HgA1C has been at goal,HgA1C went from 6.4 to 6.2. Continue non pharmacologic treatment. Annual eye exam, periodic dental and foot care recommended. F/U in 5-6 months.

## 2023-07-18 ENCOUNTER — Other Ambulatory Visit: Payer: Self-pay | Admitting: Family Medicine

## 2023-07-18 DIAGNOSIS — H40053 Ocular hypertension, bilateral: Secondary | ICD-10-CM | POA: Diagnosis not present

## 2023-07-18 DIAGNOSIS — H25813 Combined forms of age-related cataract, bilateral: Secondary | ICD-10-CM | POA: Diagnosis not present

## 2023-07-18 DIAGNOSIS — E1169 Type 2 diabetes mellitus with other specified complication: Secondary | ICD-10-CM

## 2023-07-18 DIAGNOSIS — E119 Type 2 diabetes mellitus without complications: Secondary | ICD-10-CM | POA: Diagnosis not present

## 2023-07-27 ENCOUNTER — Encounter: Payer: Self-pay | Admitting: Family Medicine

## 2023-07-27 ENCOUNTER — Ambulatory Visit: Payer: Medicare PPO | Admitting: Family Medicine

## 2023-07-27 ENCOUNTER — Other Ambulatory Visit: Payer: Self-pay | Admitting: Family Medicine

## 2023-07-27 VITALS — BP 128/80 | HR 70 | Temp 98.6°F | Resp 16 | Ht 61.0 in | Wt 251.4 lb

## 2023-07-27 DIAGNOSIS — H6991 Unspecified Eustachian tube disorder, right ear: Secondary | ICD-10-CM

## 2023-07-27 DIAGNOSIS — J329 Chronic sinusitis, unspecified: Secondary | ICD-10-CM

## 2023-07-27 DIAGNOSIS — R040 Epistaxis: Secondary | ICD-10-CM

## 2023-07-27 DIAGNOSIS — E876 Hypokalemia: Secondary | ICD-10-CM

## 2023-07-27 DIAGNOSIS — J309 Allergic rhinitis, unspecified: Secondary | ICD-10-CM | POA: Diagnosis not present

## 2023-07-27 MED ORDER — DOXYCYCLINE HYCLATE 100 MG PO TABS: 100.0000 mg | ORAL_TABLET | Freq: Two times a day (BID) | ORAL | 0 refills | Status: AC

## 2023-07-27 NOTE — Progress Notes (Signed)
ACUTE VISIT Chief Complaint  Patient presents with   Sinus Problem    Over a week, drainage, small cough & having some nose bleeds.   HPI: [name] is a 72 y.o. female with a PMHx significant for HTN, HLD, DM II, atrial fibrillation, IBS, chronic pain, and dysuria, who is here today complaining of sinus pressure and congestion for the last 7 days.   Sinus Problem This is a new problem. The current episode started 1 to 4 weeks ago. The problem has been gradually improving since onset. There has been no fever. Associated symptoms include chills, congestion, coughing, ear pain, headaches, sinus pressure and sneezing. Pertinent negatives include no diaphoresis, hoarse voice, neck pain, shortness of breath, sore throat or swollen glands. Past treatments include saline nose sprays. The treatment provided mild relief.    Patient complains of nasal congestion, rhinorrhea, productive cough, sore throat, chills, frontal parietal headache, and right ear ache. She notes her symptoms are slightly improving.  She denies blood in her sputum, but notes she has been having nosebleeds that occasionally drain into her throat.  She has a history of allergies, for which she has taken Flonase, zyrtec, and astelin.  She denies any known sick contacts.  She has tried nasal saline irrigations.  Pertinent negatives include fever, SOB, and wheezing.  She has several allergies to antibiotics.   Review of Systems  Constitutional:  Positive for chills. Negative for diaphoresis.  HENT:  Positive for congestion, ear pain, sinus pressure and sneezing. Negative for hoarse voice and sore throat.   Respiratory:  Positive for cough. Negative for shortness of breath.   Musculoskeletal:  Negative for neck pain.  Neurological:  Positive for headaches.  See other pertinent positives and negatives in HPI.  Current Outpatient Medications on File Prior to Visit  Medication Sig Dispense Refill   Accu-Chek  Softclix Lancets lancets Use to check blood sugars 1-2 times daily. 100 each 12   amLODipine (NORVASC) 5 MG tablet Take 1 tablet by mouth once daily 90 tablet 2   apixaban (ELIQUIS) 5 MG TABS tablet Take 1 tablet by mouth twice daily 180 tablet 1   Aspirin-Salicylamide-Caffeine (BC FAST PAIN RELIEF) 650-195-33.3 MG PACK Take 1 tablet by mouth as needed.     atenolol (TENORMIN) 25 MG tablet Take 0.5 tablets (12.5 mg total) by mouth daily. May take an extra 1/2 tablet for breakthrough afib 120 tablet 3   azelastine (ASTELIN) 0.1 % nasal spray Place 2 sprays into both nostrils 2 (two) times daily. Use in each nostril as directed (Patient taking differently: Place 2 sprays into both nostrils as needed. Use in each nostril as directed) 30 mL 6   Blood Glucose Monitoring Suppl (ACCU-CHEK GUIDE ME) w/Device KIT USE TO CHECK GLUCOSE ONCE DAILY TO TWICE DAILY 1 kit 0   calcium carbonate (TUMS EX) 750 MG chewable tablet Chew 2 tablets by mouth daily as needed for heartburn.     Cetirizine HCl (ZYRTEC ALLERGY PO) Take by mouth as needed.     ciclopirox (PENLAC) 8 % solution Apply topically at bedtime. Apply over nail and surrounding skin. Apply daily over previous coat. After seven (7) days, may remove with alcohol and continue cycle. 6.6 mL 2   cyclobenzaprine (FLEXERIL) 10 MG tablet Take 2.5 mg by mouth daily as needed (pain level 8 or greater).      Docusate Sodium (STOOL SOFTENER LAXATIVE PO) Take by mouth.     flecainide (TAMBOCOR) 100 MG tablet Take 1  tablet (100 mg total) by mouth 2 (two) times daily. 180 tablet 3   fluticasone (FLONASE) 50 MCG/ACT nasal spray Use 2 spray(s) in each nostril once daily 48 g 0   glucose blood (ACCU-CHEK GUIDE) test strip Use to test blood sugars 1-2 times daily. 100 each 12   hydrochlorothiazide (HYDRODIURIL) 25 MG tablet Take 1 tablet by mouth once daily 90 tablet 1   HYDROcodone-acetaminophen (NORCO/VICODIN) 5-325 MG tablet Take 0.5 tablets by mouth daily as needed (pain  level 8 or greater). 30 tablet 0   hydrocortisone 2.5 % ointment APPLY  OINTMENT TOPICALLY AS NEEDED 58 g 0   hydrOXYzine (ATARAX) 25 MG tablet TAKE 1 TABLET BY MOUTH THREE TIMES DAILY AS NEEDED FOR ANXIETY 90 tablet 1   Menthol-Zinc Oxide (CALMOSEPTINE) 0.44-20.6 % OINT Apply topically.     Metamucil Fiber CHEW Chew by mouth as needed.     miconazole (REMEDY PHYTOPLEX ANTIFUNGAL) 2 % powder Apply topically as needed for itching.     Multiple Vitamins-Minerals (MULTI-VITAMIN GUMMIES PO) Take by mouth as needed.     omeprazole (PRILOSEC) 40 MG capsule Take 40 mg by mouth 2 (two) times daily.     Polyethylene Glycol 3350 (MIRALAX PO) Take by mouth as needed.     pravastatin (PRAVACHOL) 20 MG tablet TAKE 1 TABLET BY MOUTH TWICE A WEEK 27 tablet 2   Simethicone (GAS-X PO) Take by mouth as needed.     trolamine salicylate (ASPERCREME) 10 % cream Apply 1 application topically as needed for muscle pain.     No current facility-administered medications on file prior to visit.    Past Medical History:  Diagnosis Date   Abdominal pain, epigastric 12/28/2008   Centricity Description: ABDOMINAL PAIN, EPIGASTRIC Qualifier: Diagnosis of  By: Misty Stanley CMA Duncan Dull), [name]   Centricity Description: ABDOMINAL PAIN-EPIGASTRIC Qualifier: Diagnosis of  By: Myrtie Hawk, [name]    Allergic rhinitis 09/16/2013   Allergy    Anemia    Anxiety    Anxiety disorder, unspecified 07/06/2017   Arthritis    Blood in stool    BMI 45.0-49.9, adult (HCC) 07/26/2016   Class 3 obesity with serious comorbidity and body mass index (BMI) of 45.0 to 49.9 in adult 03/07/2017   DDD (degenerative disc disease), lumbar    Diabetes mellitus without complication (HCC)    DIVERTICULOSIS-COLON 12/28/2008   Qualifier: Diagnosis of  By: Monica Becton PA-c, [name]    GERD 12/28/2008   Qualifier: Diagnosis of  By: Monica Becton PA-c, [name]    GERD (gastroesophageal reflux disease)    Headache    Hyperglycemia 03/17/2014   Hypertension     Hypokalemia 07/26/2016   IBS (irritable bowel syndrome)-C-D 07/26/2016   Obesity    Other and unspecified hyperlipidemia 03/17/2014   PERSONAL HX COLONIC POLYPS 12/28/2008   Qualifier: Diagnosis of  By: Monica Becton PA-c, [name]    Reflux esophagitis 12/28/2008   Qualifier: Diagnosis of  By: Misty Stanley CMA Duncan Dull), [name]     S/P lumbar spinal fusion 07/02/2012   Tubulovillous adenoma of colon 2008   Allergies  Allergen Reactions   Crestor [Rosuvastatin]    Erythromycin     rash   Jublia [Efinaconazole] Hives    Redness/burning around nail   Lansoprazole    Penicillins     rash   Potassium-Containing Compounds Other (See Comments)    Stomach upset, can take pills, but not the packets    Robaxin [Methocarbamol]     Messed up stomach   Sulfonamide  Derivatives     rash   Tea Tree Oil     Redness, burning (localized)   Trental [Pentoxifylline Er]     Bad headache    Social History   Socioeconomic History   Marital status: Widowed    Spouse name: Not on file   Number of children: Not on file   Years of education: Not on file   Highest education level: Some college, no degree  Occupational History   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  Tobacco Use   Smoking status: Former    Current packs/day: 0.00    Average packs/day: 1 pack/day for 38.0 years (38.0 ttl pk-yrs)    Types: Cigarettes    Start date: 09/08/1960    Quit date: 09/08/1998    Years since quitting: 24.8   Smokeless tobacco: Never   Tobacco comments:    Former smoker 06/06/21  Vaping Use   Vaping status: Never Used  Substance and Sexual Activity   Alcohol use: No   Drug use: No   Sexual activity: Not Currently    Comment: Husband passed away in 1999/08/11  Other Topics Concern   Not on file  Social History Narrative   Widow, 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   Social Determinants of Health   Financial Resource Strain: Low Risk  (08/14/2022)   Overall Financial Resource Strain (CARDIA)    Difficulty of  Paying Living Expenses: Not hard at all  Food Insecurity: No Food Insecurity (08/14/2022)   Hunger Vital Sign    Worried About Running Out of Food in the Last Year: Never true    Ran Out of Food in the Last Year: Never true  Transportation Needs: No Transportation Needs (08/14/2022)   PRAPARE - Administrator, Civil Service (Medical): No    Lack of Transportation (Non-Medical): No  Physical Activity: Insufficiently Active (08/11/2022)   Exercise Vital Sign    Days of Exercise per Week: 5 days    Minutes of Exercise per Session: 20 min  Stress: No Stress Concern Present (08/11/2022)   [employer] of Occupational Health - Occupational Stress Questionnaire    Feeling of Stress : Not at all  Social Connections: Moderately Integrated (08/11/2022)   Social Connection and Isolation Panel [NHANES]    Frequency of Communication with Friends and Family: More than three times a week    Frequency of Social Gatherings with Friends and Family: More than three times a week    Attends Religious Services: More than 4 times per year    Active Member of [employer] or Organizations: Yes    Attends Banker Meetings: More than 4 times per year    Marital Status: Widowed    Vitals:   07/27/23 1207  BP: 128/80  Pulse: 70  Resp: 16  Temp: 98.6 F (37 C)  SpO2: 96%   Body mass index is 47.5 kg/m.  Physical Exam Vitals and nursing note reviewed.  Constitutional:      General: She is not in acute distress.    Appearance: She is well-developed. She is not ill-appearing.  HENT:     Head: Normocephalic and atraumatic.     Right Ear: Ear canal and external ear normal. A middle ear effusion is present. Tympanic membrane is not erythematous.     Left Ear: Tympanic membrane, ear canal and external ear normal.     Ears:     Comments: ***    Nose: Septal deviation and rhinorrhea present.  Left Nostril: No epistaxis.     Right Turbinates: Enlarged.     Left Turbinates: Enlarged.      Right Sinus: Maxillary sinus tenderness and frontal sinus tenderness present.     Left Sinus: Maxillary sinus tenderness and frontal sinus tenderness present.     Comments: Hyperemic nasal mucosa.    Mouth/Throat:     Mouth: Mucous membranes are moist.  Eyes:     Conjunctiva/sclera: Conjunctivae normal.  Cardiovascular:     Rate and Rhythm: Normal rate and regular rhythm.     Heart sounds: No murmur heard. Pulmonary:     Effort: Pulmonary effort is normal. No respiratory distress.     Breath sounds: Normal breath sounds. No stridor.  Lymphadenopathy:     Head:     Right side of head: No submandibular adenopathy.     Left side of head: No submandibular adenopathy.     Cervical: No cervical adenopathy.  Skin:    General: Skin is warm.     Findings: No erythema or rash.  Neurological:     Mental Status: She is alert and oriented to person, place, and time.     Comments: Antalgic gait assisted with a cane.  Psychiatric:        Mood and Affect: Mood and affect normal.    ASSESSMENT AND PLAN:  Ms. Momin was seen today for a possible sinus infection.   Sinusitis, unspecified chronicity, unspecified location -     Doxycycline Hyclate; Take 1 tablet (100 mg total) by mouth 2 (two) times daily for 10 days.  Dispense: 20 tablet; Refill: 0  Allergic rhinitis, unspecified seasonality, unspecified trigger  Epistaxis    Return if symptoms worsen or fail to improve, for keep next appointment.  I, Rolla Etienne Wierda, acting as a scribe for Latysha Thackston [place], MD., have documented all relevant documentation on the behalf of Pang Robers [place], MD, as directed by  Shina Wass [place], MD while in the presence of Larisha Vencill [place], MD.   I, Otilia Kareem [place], MD, have reviewed all documentation for this visit. The documentation on 07/27/23 for the exam, diagnosis, procedures, and orders are all accurate and complete.  Ting Cage G. [place], MD  [facility]. [place] office.

## 2023-07-27 NOTE — Patient Instructions (Addendum)
A few things to remember from today's visit:  Sinusitis, unspecified chronicity, unspecified location - Plan: doxycycline (VIBRA-TABS) 100 MG tablet  Allergic rhinitis, unspecified seasonality, unspecified trigger  Epistaxis I do not think antibiotic is necessary now, you can wait a few days , 4 days, and if not any better start medication. Hold on Zyrtec for a few days, continue rinsing nose with saline.  here are 2 forms of allergic rhinitis: Seasonal (hay fever): Caused by an allergy to pollen and/or mold spores in the air. Pollen is the fine powder that comes from the stamen of flowering plants. It can be carried through the air and is easily inhaled. Symptoms are seasonal and usually occur in spring, late summer, and fall. Perennial: Caused by other allergens such as dust mites, pet hair or dander, or mold. Symptoms occur year-round.  Symptoms: Your symptoms can vary, depending on the severity of your allergies. Symptoms can include: Sneezing, coughing.itching (mostly eyes, nose, mouth, throat and skin),runny nose,stuffy nose.headache,pressure in the nose and cheeks,ear fullness and popping, sore throat.watery, red, or swollen eyes,dark circles under your eyes,trouble smelling, and sometimes hives.  Allergic rhinitis cannot be prevented. You can help your symptoms by avoiding the things that you are allergic, including: Keeping windows closed. This is especially important during high-pollen seasons. Washing your hands after petting animals. Using dust- and mite-proof bedding and mattress covers. Wearing glasses outside to protect your eyes. Showering before bed to wash off allergens from hair and skin. You can also avoid things that can make your symptoms worse, such as: aerosol sprays air pollution cold temperatures humidity irritating fumes tobacco smoke wind wood smoke.   Antihistamines help reduce the sneezing, runny nose, and itchiness of allergies. These come in pill form  and as nasal sprays. Allegra,Zyrtec,or Claritin are some examples. Decongestants, such as pseudoephedrine and phenylephrine, help temporarily relieve the stuffy nose of allergies. Decongestants are found in many medicines and come as pills, nose sprays, and nose drops. They could increase heart rate and cause tachycardia and tremor. Nasal Afrin should not be used for more than 3 days because you can become dependent on them. This causes you to feel even more stopped-up when you try to quit using them.  Nasal sprays: steroids or antihistaminics. Over the counter intranasal sterids: Nasocort,Rhinocort,or Flonase.You won't notice their benefits for up to 2 weeks after starting them. Allergy shots or sublingual tablets when other treatment do not help.This is done by immunologists.  If you need refills for medications you take chronically, please call your pharmacy. Do not use My Chart to request refills or for acute issues that need immediate attention. If you send a my chart message, it may take a few days to be addressed, specially if I am not in the office.  Please be sure medication list is accurate. If a new problem present, please set up appointment sooner than planned today.

## 2023-08-13 ENCOUNTER — Telehealth: Payer: Self-pay | Admitting: Family Medicine

## 2023-08-13 NOTE — Telephone Encounter (Signed)
Pt called to ask MD if it would be a good idea for her to get a Pneumo shot? Pt is 72 years old and has already had the following:  Name Date  Pneumococcal Conjugate-13 05/16/2019   Name Date  Pneumococcal Polysaccharide-23 04/27/2017   Pt was informed MD is OOO today and possibly tomorrow.  Please advise.

## 2023-08-15 NOTE — Telephone Encounter (Signed)
I called and spoke with patient. She is aware that she already had her 2 pneumonia vaccines, so no more are needed.

## 2023-08-16 ENCOUNTER — Encounter: Payer: Self-pay | Admitting: Family Medicine

## 2023-08-16 ENCOUNTER — Ambulatory Visit: Payer: Medicare PPO | Admitting: Family Medicine

## 2023-08-16 VITALS — BP 128/79 | Ht 61.0 in | Wt 244.0 lb

## 2023-08-16 DIAGNOSIS — Z0000 Encounter for general adult medical examination without abnormal findings: Secondary | ICD-10-CM

## 2023-08-16 MED ORDER — FLUCONAZOLE 150 MG PO TABS: 150.0000 mg | ORAL_TABLET | Freq: Every day | ORAL | 0 refills | Status: DC

## 2023-08-16 NOTE — Progress Notes (Signed)
PATIENT CHECK-IN and HEALTH RISK ASSESSMENT QUESTIONNAIRE:  -completed by phone/video for upcoming Medicare Preventive Visit  Pre-Visit Check-in: 1)Vitals (height, wt, BP, etc) - record in vitals section for visit on day of visit Request home vitals (wt, BP, etc.) and enter into vitals, THEN update Vital Signs SmartPhrase below at the top of the HPI. See below.  2)Review and Update Medications, Allergies PMH, Surgeries, Social history in Epic 3)Hospitalizations in the last year with date/reason? No  4)Review and Update Care Team (patient's specialists) in Epic see Podiatry and Eye Dr.  5) Complete PHQ9 in Epic  6) Complete Fall Screening in Epic 7)Review all Health Maintenance Due and order under PCP if not done.  8)Medicare Wellness Questionnaire: Answer theses question about your habits: Do you drink alcohol? no  Have you ever smoked?Yes Quit date if applicable? 1999  How many packs a day do/did you smoke? Half a pack  Do you use smokeless tobacco?no  Do you use an illicit drugs?no Do you exercises? Yes, treadmill 3 x a week for 15 minutes, she does some sheets of exercises sometimes Are you sexually active? No Number of partners? Typical breakfast none Typical lunch  salad, soups Typical dinner salads, soups, fruit Typical snacks:jello or fruit  Beverages: drinks mostly water, gatorade, coke or gingerale, cranberry & orange juice  Answer theses question about you: Can you perform most household chores?yes takes a while but yes Do you find it hard to follow a conversation in a noisy room?not really Do you often ask people to speak up or repeat themselves?sometimes Do you feel that you have a problem with memory?never been good at keeping peoples names or numbers Do you balance your checkbook and or bank acounts?yes Do you feel safe at home?yes Last dentist visit? So long ago she thinks it was in 2000 Do you need assistance with any of the following: Please note if so  no  Driving?  Feeding yourself?  Getting from bed to chair?  Getting to the toilet?  Bathing or showering?  Dressing yourself?  Managing money?  Climbing a flight of stairs  Preparing meals?  Do you have Advanced Directives in place (Living Will, Healthcare Power or Attorney)? no   Last eye Exam and location?07/28/23  [facility]    Do you currently use prescribed or non-prescribed narcotic or opioid pain medications?yes - but reports only take on the bad days  Do you have a history or close family history of breast, ovarian, tubal or peritoneal cancer or a family member with BRCA (breast cancer susceptibility 1 and 2) gene mutations?  Sister had ovarian cancer  Request home vitals (wt, BP, etc.) and enter into vitals, THEN update Vital Signs SmartPhrase below at the top of the HPI. See below.   Nurse/Assistant Credentials/time stamp Prince Solian, CMA 11:49   ----------------------------------------------------------------------------------------------------------------------------------------------------------------------------------------------------------------------  Because this visit was a virtual/telehealth visit, some criteria may be missing or patient reported. Any vitals not documented were not able to be obtained and vitals that have been documented are patient reported.    MEDICARE ANNUAL PREVENTIVE VISIT WITH PROVIDER: (Welcome to Medicare, initial annual wellness or annual wellness exam)  Virtual Visit via Phone Note  I connected with [name] on 08/16/23 by phone a video enabled telemedicine application and verified that I am speaking with the correct person using two identifiers.  Location patient: home Location provider:work or home office Persons participating in the virtual visit: patient, provider  Concerns and/or follow up today: no new  concerns, does deal with chronic back pain. She is requesting diflucan for yeast infection.  Reports just got done with an abx and as usual got vaginitis - thick itchy discharge. She reports pcp usually gives diflucan for this. No other symptoms.    See HM section in Epic for other details of completed HM.    ROS: negative for report of fevers, unintentional weight loss, vision changes, vision loss, hearing loss or change, chest pain, sob, hemoptysis, melena, hematochezia, hematuria, falls, bleeding or bruising, thoughts of suicide or self harm, memory loss  Patient-completed extensive health risk assessment - reviewed and discussed with the patient: See Health Risk Assessment completed with patient prior to the visit either above or in recent phone note. This was reviewed in detailed with the patient today and appropriate recommendations, orders and referrals were placed as needed per Summary below and patient instructions.   Review of Medical History: -PMH, PSH, Family History and current specialty and care providers reviewed and updated and listed below   Patient Care Team: Sw[place]il[place], [name], MD as PCP - General (Family Medicine) Nahser, Deloris Ping, MD as PCP - Cardiology (Cardiology)   Past Medical History:  Diagnosis Date   Abdominal pain, epigastric 12/28/2008   Centricity Description: ABDOMINAL PAIN, EPIGASTRIC Qualifier: Diagnosis of  By: Misty Stanley CMA Duncan Dull), [name]   Centricity Description: ABDOMINAL PAIN-EPIGASTRIC Qualifier: Diagnosis of  By: Myrtie Hawk, [name]    Allergic rhinitis 09/16/2013   Allergy    Anemia    Anxiety    Anxiety disorder, unspecified 07/06/2017   Arthritis    Blood in stool    BMI 45.0-49.9, adult (HCC) 07/26/2016   Class 3 obesity with serious comorbidity and body mass index (BMI) of 45.0 to 49.9 in adult 03/07/2017   DDD (degenerative disc disease), lumbar    Diabetes mellitus without complication (HCC)    DIVERTICULOSIS-COLON 12/28/2008   Qualifier: Diagnosis of  By: Monica Becton PA-c, [name]    GERD 12/28/2008   Qualifier: Diagnosis of   By: Monica Becton PA-c, [name]    GERD (gastroesophageal reflux disease)    Headache    Hyperglycemia 03/17/2014   Hypertension    Hypokalemia 07/26/2016   IBS (irritable bowel syndrome)-C-D 07/26/2016   Obesity    Other and unspecified hyperlipidemia 03/17/2014   PERSONAL HX COLONIC POLYPS 12/28/2008   Qualifier: Diagnosis of  By: Monica Becton PA-c, [name]    Reflux esophagitis 12/28/2008   Qualifier: Diagnosis of  By: Misty Stanley CMA (AAMA), [name]     S/P lumbar spinal fusion 07/02/2012   Tubulovillous adenoma of colon 2008    Past Surgical History:  Procedure Laterality Date   ABDOMINAL HYSTERECTOMY     COLONOSCOPY  2014   laser surgery to remove scar     not sure of the year; after hyst   RADIOLOGY WITH ANESTHESIA N/A 02/25/2015   Procedure: MRI OF LUMBAR SPINE;  Surgeon: Medication Radiologist, MD;  Location: [facility] OR;  Service: Radiology;  Laterality: N/A;  DR. 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  Tobacco Use   Smoking status: Former    Current packs/day: 0.00  Average packs/day: 1 pack/day for 38.0 years (38.0 ttl pk-yrs)    Types: Cigarettes    Start date: 09/08/1960    Quit date: 09/08/1998    Years since quitting: 24.9   Smokeless tobacco: Never   Tobacco comments:    Former smoker 06/06/21  Vaping Use   Vaping status: Never Used  Substance and Sexual Activity   Alcohol use: No   Drug use: No   Sexual activity: Not Currently    Comment: Husband passed away in Aug 24, 1999  Other Topics Concern   Not on file  Social History Narrative   Widow, 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   Social Determinants of Health   Financial Resource Strain: Low Risk  (08/14/2022)    Overall Financial Resource Strain (CARDIA)    Difficulty of Paying Living Expenses: Not hard at all  Food Insecurity: No Food Insecurity (08/14/2022)   Hunger Vital Sign    Worried About Running Out of Food in the Last Year: Never true    Ran Out of Food in the Last Year: Never true  Transportation Needs: No Transportation Needs (08/14/2022)   PRAPARE - Administrator, Civil Service (Medical): No    Lack of Transportation (Non-Medical): No  Physical Activity: Insufficiently Active (08/11/2022)   Exercise Vital Sign    Days of Exercise per Week: 5 days    Minutes of Exercise per Session: 20 min  Stress: No Stress Concern Present (08/11/2022)   [employer] of Occupational Health - Occupational Stress Questionnaire    Feeling of Stress : Not at all  Social Connections: Moderately Integrated (08/11/2022)   Social Connection and Isolation Panel [NHANES]    Frequency of Communication with Friends and Family: More than three times a week    Frequency of Social Gatherings with Friends and Family: More than three times a week    Attends Religious Services: More than 4 times per year    Active Member of [employer] or Organizations: Yes    Attends Banker Meetings: More than 4 times per year    Marital Status: Widowed  Intimate Partner Violence: Not At Risk (08/11/2022)   Humiliation, Afraid, Rape, and Kick questionnaire    Fear of Current or Ex-Partner: No    Emotionally Abused: No    Physically Abused: No    Sexually Abused: No    Family History  Problem Relation Age of Onset   Hypertension Mother    Hyperlipidemia Mother    Hyperlipidemia Father    Hypertension Father    Arthritis Father    Hyperlipidemia Sister    Cervical cancer Sister    Diabetes Sister    Heart disease Sister    Cancer Maternal Grandfather    Hypertension Paternal Grandmother    Pancreatic cancer Maternal Aunt    Breast cancer Paternal Aunt    Breast cancer Cousin     Current  Outpatient Medications on File Prior to Visit  Medication Sig Dispense Refill   Accu-Chek Softclix Lancets lancets Use to check blood sugars 1-2 times daily. 100 each 12   amLODipine (NORVASC) 5 MG tablet Take 1 tablet by mouth once daily 90 tablet 2   apixaban (ELIQUIS) 5 MG TABS tablet Take 1 tablet by mouth twice daily 180 tablet 1   Aspirin-Salicylamide-Caffeine (BC FAST PAIN RELIEF) 650-195-33.3 MG PACK Take 1 tablet by mouth as needed.     atenolol (TENORMIN) 25 MG tablet Take 0.5 tablets (12.5 mg total) by mouth daily. May take an  extra 1/2 tablet for breakthrough afib 120 tablet 3   azelastine (ASTELIN) 0.1 % nasal spray Place 2 sprays into both nostrils 2 (two) times daily. Use in each nostril as directed (Patient taking differently: Place 2 sprays into both nostrils as needed. Use in each nostril as directed) 30 mL 6   Blood Glucose Monitoring Suppl (ACCU-CHEK GUIDE ME) w/Device KIT USE TO CHECK GLUCOSE ONCE DAILY TO TWICE DAILY 1 kit 0   calcium carbonate (TUMS EX) 750 MG chewable tablet Chew 2 tablets by mouth daily as needed for heartburn.     Cetirizine HCl (ZYRTEC ALLERGY PO) Take by mouth as needed.     ciclopirox (PENLAC) 8 % solution Apply topically at bedtime. Apply over nail and surrounding skin. Apply daily over previous coat. After seven (7) days, may remove with alcohol and continue cycle. 6.6 mL 2   cyclobenzaprine (FLEXERIL) 10 MG tablet Take 2.5 mg by mouth daily as needed (pain level 8 or greater).      Docusate Sodium (STOOL SOFTENER LAXATIVE PO) Take by mouth.     flecainide (TAMBOCOR) 100 MG tablet Take 1 tablet (100 mg total) by mouth 2 (two) times daily. 180 tablet 3   fluticasone (FLONASE) 50 MCG/ACT nasal spray Use 2 spray(s) in each nostril once daily 48 g 0   glucose blood (ACCU-CHEK GUIDE) test strip Use to test blood sugars 1-2 times daily. 100 each 12   hydrochlorothiazide (HYDRODIURIL) 25 MG tablet Take 1 tablet by mouth once daily 90 tablet 1    HYDROcodone-acetaminophen (NORCO/VICODIN) 5-325 MG tablet Take 0.5 tablets by mouth daily as needed (pain level 8 or greater). 30 tablet 0   hydrocortisone 2.5 % ointment APPLY  OINTMENT TOPICALLY AS NEEDED 58 g 0   hydrOXYzine (ATARAX) 25 MG tablet TAKE 1 TABLET BY MOUTH THREE TIMES DAILY AS NEEDED FOR ANXIETY 90 tablet 1   Menthol-Zinc Oxide (CALMOSEPTINE) 0.44-20.6 % OINT Apply topically.     Metamucil Fiber CHEW Chew by mouth as needed.     miconazole (REMEDY PHYTOPLEX ANTIFUNGAL) 2 % powder Apply topically as needed for itching.     Multiple Vitamins-Minerals (MULTI-VITAMIN GUMMIES PO) Take by mouth as needed.     omeprazole (PRILOSEC) 40 MG capsule Take 40 mg by mouth 2 (two) times daily.     Polyethylene Glycol 3350 (MIRALAX PO) Take by mouth as needed.     potassium chloride (KLOR-CON) 10 MEQ tablet Take 2 tablets by mouth once daily 180 tablet 0   pravastatin (PRAVACHOL) 20 MG tablet TAKE 1 TABLET BY MOUTH TWICE A WEEK 27 tablet 2   Simethicone (GAS-X PO) Take by mouth as needed.     trolamine salicylate (ASPERCREME) 10 % cream Apply 1 application topically as needed for muscle pain.     No current facility-administered medications on file prior to visit.    Allergies  Allergen Reactions   Crestor [Rosuvastatin]    Erythromycin     rash   Jublia [Efinaconazole] Hives    Redness/burning around nail   Lansoprazole    Penicillins     rash   Potassium-Containing Compounds Other (See Comments)    Stomach upset, can take pills, but not the packets    Robaxin [Methocarbamol]     Messed up stomach   Sulfonamide Derivatives     rash   Tea Tree Oil     Redness, burning (localized)   Trental [Pentoxifylline Er]     Bad headache       Physical  Exam Vitals requested from patient and listed below if patient had equipment and was able to obtain at home for this virtual visit: Vitals:   08/16/23 1115  BP: 128/79  SpO2: 98%   Estimated body mass index is 46.1 kg/m as  calculated from the following:   Height as of this encounter: 5\' 1"  (1.549 m).   Weight as of this encounter: 244 lb (110.7 kg).  EKG (optional): deferred due to virtual visit  GENERAL: alert, oriented, no acute distress detected, full vision exam deferred due to pandemic and/or virtual encounter  PSYCH/NEURO: pleasant and cooperative, no obvious depression or anxiety, speech and thought processing grossly intact, Cognitive function grossly intact  Flowsheet Row Office Visit from [facility]/16/2023 in [facility] [place]  PHQ-9 Total Score 0           08/16/2023   11:32 AM 07/27/2023   12:08 PM 04/04/2023   10:33 AM 08/15/2022    9:37 AM 08/11/2022    2:50 PM  Depression screen PHQ 2/9  Decreased Interest 0 0 0 0 0  Down, Depressed, Hopeless 0 0 0 0 0  PHQ - 2 Score 0 0 0 0 0  Altered sleeping 0  0 2 0  Tired, decreased energy 0  1 1 0  Change in appetite 0  0 1 0  Feeling bad or failure about yourself  0  0 0 0  Trouble concentrating 0  0 0 0  Moving slowly or fidgety/restless 0  0 3 0  Suicidal thoughts 0  0 0 0  PHQ-9 Score 0  1 7 0  Difficult doing work/chores Not difficult at all  Somewhat difficult Somewhat difficult Not difficult at all       08/09/2022   12:37 PM 08/11/2022    2:58 PM 08/15/2022    9:37 AM 04/04/2023   10:33 AM 08/16/2023   11:34 AM  Fall Risk  Falls in the past year? 1 1 1  0 1  Was there an injury with Fall? 0 0 0 0 0  Was there an injury with Fall? - Comments  No injuries or medical attention needed     Fall Risk Category Calculator 2 2 2  0 2  Fall Risk Category (Retired) Moderate Moderate Moderate    (RETIRED) Patient Fall Risk Level  Moderate fall risk Moderate fall risk    Patient at Risk for Falls Due to   History of fall(s) Other (Comment) History of fall(s)  Fall risk Follow up  Falls prevention discussed Falls evaluation completed Falls evaluation completed Falls evaluation completed   Had fall at mailbox after epidural  shot when was numb from the shot. No injury from the fall. Has some balance exercises at baseline but the epidural shot made it much worse. Walks with a cane. Does some balance exercises.   SUMMARY AND PLAN:  Encounter for Medicare annual wellness exam   Discussed applicable health maintenance/preventive health measures and advised and referred or ordered per patient preferences: -she declined the bone density test, was normal in 2020 -declined shingles due to being on steroid for her back -she plans to discuss colonoscopy with GI as is not sure she wants to do Health Maintenance  Topic Date Due   OPHTHALMOLOGY EXAM  06/27/2022   Colonoscopy  10/17/2023   Zoster Vaccines- Shingrix (1 of 2) 11/16/2023 (Originally 07/25/1970)   COVID-19 Vaccine (6 - 2023-24 season) 10/10/2023   HEMOGLOBIN A1C  01/01/2024   Diabetic kidney evaluation -  eGFR measurement  02/27/2024   Diabetic kidney evaluation - Urine ACR  02/27/2024   FOOT EXAM  03/21/2024   Medicare Annual Wellness (AWV)  08/15/2024   MAMMOGRAM  08/29/2024   DTaP/Tdap/Td (3 - Td or Tdap) 01/08/2028   INFLUENZA VACCINE  Completed   DEXA SCAN  Completed   Hepatitis C Screening  Completed   HPV VACCINES  Aged Out   Pneumonia Vaccine 59+ Years old  Discontinued     Education and counseling on the following was provided based on the above review of health and a plan/checklist for the patient, along with additional information discussed, was provided for the patient in the patient instructions :  -Advised on importance of completing advanced directives, discussed options for completing and provided information in patient instructions as well -Provided counseling and plan for increased risk of falling if applicable per above screening. Reviewed and demonstrated safe balance exercises that can be done at home to improve balance and discussed exercise guidelines for adults with include balance exercises at least 3 days per week.  -Advised  and counseled on a healthy lifestyle -Reviewed patient's current diet. Advised and counseled on a whole foods based healthy diet. A summary of a healthy diet was provided in the Patient Instructions.  -reviewed patient's current physical activity level and discussed exercise guidelines for adults. Discussed community resources and ideas for safe exercise at home to assist in meeting exercise guideline recommendations in a safe and healthy way.  -Advise yearly dental visits at minimum and regular eye exams -sent diflucan for vaginitis and advised follow up with PCP if not resolved promptly  Follow up: see patient instructions     Patient Instructions  I really enjoyed getting to talk with you today! I am available on Tuesdays and Thursdays for virtual visits if you have any questions or concerns, or if I can be of any further assistance.   CHECKLIST FROM ANNUAL WELLNESS VISIT:  -Follow up (please call to schedule if not scheduled after visit):   -yearly for annual wellness visit with primary care office  -follow up with Dr. Swaziland if vaginitis not better with the diflucan over the next 1 week  Here is a list of your preventive care/health maintenance measures and the plan for each if any are due:  PLAN For any measures below that may be due:   Health Maintenance  Topic Date Due   OPHTHALMOLOGY EXAM  06/27/2022   Colonoscopy  10/17/2023   Zoster Vaccines- Shingrix (1 of 2) 11/16/2023 (Originally 07/25/1970)   COVID-19 Vaccine (6 - 2023-24 season) 10/10/2023   HEMOGLOBIN A1C  01/01/2024   Diabetic kidney evaluation - eGFR measurement  02/27/2024   Diabetic kidney evaluation - Urine ACR  02/27/2024   FOOT EXAM  03/21/2024   Medicare Annual Wellness (AWV)  08/15/2024   MAMMOGRAM  08/29/2024   DTaP/Tdap/Td (3 - Td or Tdap) 01/08/2028   INFLUENZA VACCINE  Completed   DEXA SCAN  Completed   Hepatitis C Screening  Completed   HPV VACCINES  Aged Out   Pneumonia Vaccine 75+ Years old   Discontinued    -See a dentist at least yearly  -Get your eyes checked and then per your eye specialist's recommendations  -Other issues addressed today:   -I have included below further information regarding a healthy whole foods based diet, physical activity guidelines for adults, stress management and opportunities for social connections. I hope you find this information useful.   -----------------------------------------------------------------------------------------------------------------------------------------------------------------------------------------------------------------------------------------------------------  NUTRITION: -eat real  food: lots of colorful vegetables (half the plate) and fruits -5-7 servings of vegetables and fruits per day (fresh or steamed is best), exp. 2 servings of vegetables with lunch and dinner and 2 servings of fruit per day. Berries and greens such as kale and collards are great choices.  -consume on a regular basis: whole grains (make sure first ingredient on label contains the word "whole"), fresh fruits, fish, nuts, seeds, healthy oils (such as olive oil, avocado oil, grape seed oil) -may eat small amounts of dairy and lean meat on occasion, but avoid processed meats such as ham, bacon, lunch meat, etc. -drink water -try to avoid fast food and pre-packaged foods, processed meat -most experts advise limiting sodium to < 2300mg  per day, should limit further is any chronic conditions such as high blood pressure, heart disease, diabetes, etc. The American Heart Association advised that < 1500mg  is is ideal -try to avoid foods that contain any ingredients with names you do not recognize  -try to avoid sugar/sweets (except for the natural sugar that occurs in fresh fruit) -try to avoid sweet drinks -try to avoid white rice, white bread, pasta (unless whole grain), white or yellow potatoes  EXERCISE GUIDELINES FOR ADULTS: -if you wish to increase  your physical activity, do so gradually and with the approval of your doctor -STOP and seek medical care immediately if you have any chest pain, chest discomfort or trouble breathing when starting or increasing exercise  -move and stretch your body, legs, feet and arms when sitting for long periods -Physical activity guidelines for optimal health in adults: -least 150 minutes per week of aerobic exercise (can talk, but not sing) once approved by your doctor, 20-30 minutes of sustained activity or two 10 minute episodes of sustained activity every day.  -resistance training at least 2 days per week if approved by your doctor -balance exercises 3+ days per week:   Stand somewhere where you have something sturdy to hold onto if you lose balance.    1) lift up on toes, start with 5x per day and work up to 20x   2) stand and lift on leg straight out to the side so that foot is a few inches of the floor, start with 5x each side and work up to 20x each side   3) stand on one foot, start with 5 seconds each side and work up to 20 seconds on each side  If you need ideas or help with getting more active:  -Silver sneakers https://tools.silversneakers.com  -Walk with a Doc: http://www.duncan-williams.com/  -try to include resistance (weight lifting/strength building) and balance exercises twice per week: or the following link for ideas: http://castillo-powell.com/  BuyDucts.dk  STRESS MANAGEMENT: -can try meditating, or just sitting quietly with deep breathing while intentionally relaxing all parts of your body for 5 minutes daily -if you need further help with stress, anxiety or depression please follow up with your primary doctor or contact the wonderful folks at [facility]: [phone]  SOCIAL CONNECTIONS: -options in [place] if you wish to engage in more social and exercise related  activities:  -Silver sneakers https://tools.silversneakers.com  -Walk with a Doc: http://www.duncan-williams.com/  -Check out the [facility] Active Adults 50+ section on the [place] [employer] (hiking clubs, book clubs, cards and games, chess, exercise classes, aquatic classes and much more) - see the website for details: https://www.[place].gov/departments/parks-recreation/active-adults50  -YouTube has lots of exercise videos for different ages and abilities as well  -[facility] (a variety  of indoor and outdoor inperson activities for adults). [phone]. [address]  -Virtual Online Classes (a variety of topics): see seniorplanet.org or call [phone]  -consider volunteering at a school, hospice center, church, senior center or elsewhere   ADVANCED HEALTHCARE DIRECTIVES:  [place] Advanced Directives assistance:   ExpressWeek.com.cy  Everyone should have advanced health care directives in place. This is so that you get the care you want, should you ever be in a situation where you are unable to make your own medical decisions.   From the [place] Advanced Directive Website: "Advance Health Care Directives are legal documents in which you give written instructions about your health care if, in the future, you cannot speak for yourself.   A health care power of attorney allows you to name a person you trust to make your health care decisions if you cannot make them yourself. A declaration of a desire for a natural death (or living will) is document, which states that you desire not to have your life prolonged by extraordinary measures if you have a terminal or incurable illness or if you are in a vegetative state. An advance instruction for mental health treatment makes a declaration of instructions, information and preferences regarding your mental health treatment. It also states that you are aware  that the advance instruction authorizes a mental health treatment provider to act according to your wishes. It may also outline your consent or refusal of mental health treatment. A declaration of an anatomical gift allows anyone over the age of 21 to make a gift by will, organ donor card or other document."   Please see the following website or an elder law attorney for forms, FAQs and for completion of advanced directives: [place] TEFL teacher Health Care Directives Advance Health Care Directives ([email or web address]/)  Or copy and paste the following to your web browser: [email or web address]          Terressa Koyanagi, DO

## 2023-08-16 NOTE — Patient Instructions (Signed)
I really enjoyed getting to talk with you today! I am available on Tuesdays and Thursdays for virtual visits if you have any questions or concerns, or if I can be of any further assistance.   CHECKLIST FROM ANNUAL WELLNESS VISIT:  -Follow up (please call to schedule if not scheduled after visit):   -yearly for annual wellness visit with primary care office  -follow up with Dr. Swaziland if vaginitis not better with the diflucan over the next 1 week  Here is a list of your preventive care/health maintenance measures and the plan for each if any are due:  PLAN For any measures below that may be due:   Health Maintenance  Topic Date Due   OPHTHALMOLOGY EXAM  06/27/2022   Colonoscopy  10/17/2023   Zoster Vaccines- Shingrix (1 of 2) 11/16/2023 (Originally 07/25/1970)   COVID-19 Vaccine (6 - 2023-24 season) 10/10/2023   HEMOGLOBIN A1C  01/01/2024   Diabetic kidney evaluation - eGFR measurement  02/27/2024   Diabetic kidney evaluation - Urine ACR  02/27/2024   FOOT EXAM  03/21/2024   Medicare Annual Wellness (AWV)  08/15/2024   MAMMOGRAM  08/29/2024   DTaP/Tdap/Td (3 - Td or Tdap) 01/08/2028   INFLUENZA VACCINE  Completed   DEXA SCAN  Completed   Hepatitis C Screening  Completed   HPV VACCINES  Aged Out   Pneumonia Vaccine 10+ Years old  Discontinued    -See a dentist at least yearly  -Get your eyes checked and then per your eye specialist's recommendations  -Other issues addressed today:   -I have included below further information regarding a healthy whole foods based diet, physical activity guidelines for adults, stress management and opportunities for social connections. I hope you find this information useful.   -----------------------------------------------------------------------------------------------------------------------------------------------------------------------------------------------------------------------------------------------------------  NUTRITION: -eat  real food: lots of colorful vegetables (half the plate) and fruits -5-7 servings of vegetables and fruits per day (fresh or steamed is best), exp. 2 servings of vegetables with lunch and dinner and 2 servings of fruit per day. Berries and greens such as kale and collards are great choices.  -consume on a regular basis: whole grains (make sure first ingredient on label contains the word "whole"), fresh fruits, fish, nuts, seeds, healthy oils (such as olive oil, avocado oil, grape seed oil) -may eat small amounts of dairy and lean meat on occasion, but avoid processed meats such as ham, bacon, lunch meat, etc. -drink water -try to avoid fast food and pre-packaged foods, processed meat -most experts advise limiting sodium to < 2300mg  per day, should limit further is any chronic conditions such as high blood pressure, heart disease, diabetes, etc. The American Heart Association advised that < 1500mg  is is ideal -try to avoid foods that contain any ingredients with names you do not recognize  -try to avoid sugar/sweets (except for the natural sugar that occurs in fresh fruit) -try to avoid sweet drinks -try to avoid white rice, white bread, pasta (unless whole grain), white or yellow potatoes  EXERCISE GUIDELINES FOR ADULTS: -if you wish to increase your physical activity, do so gradually and with the approval of your doctor -STOP and seek medical care immediately if you have any chest pain, chest discomfort or trouble breathing when starting or increasing exercise  -move and stretch your body, legs, feet and arms when sitting for long periods -Physical activity guidelines for optimal health in adults: -least 150 minutes per week of aerobic exercise (can talk, but not sing) once approved by your doctor, 20-30  minutes of sustained activity or two 10 minute episodes of sustained activity every day.  -resistance training at least 2 days per week if approved by your doctor -balance exercises 3+ days per  week:   Stand somewhere where you have something sturdy to hold onto if you lose balance.    1) lift up on toes, start with 5x per day and work up to 20x   2) stand and lift on leg straight out to the side so that foot is a few inches of the floor, start with 5x each side and work up to 20x each side   3) stand on one foot, start with 5 seconds each side and work up to 20 seconds on each side  If you need ideas or help with getting more active:  -Silver sneakers https://tools.silversneakers.com  -Walk with a Doc: http://www.duncan-williams.com/  -try to include resistance (weight lifting/strength building) and balance exercises twice per week: or the following link for ideas: http://castillo-powell.com/  BuyDucts.dk  STRESS MANAGEMENT: -can try meditating, or just sitting quietly with deep breathing while intentionally relaxing all parts of your body for 5 minutes daily -if you need further help with stress, anxiety or depression please follow up with your primary doctor or contact the wonderful folks at [facility]: [phone]  SOCIAL CONNECTIONS: -options in [place] if you wish to engage in more social and exercise related activities:  -Silver sneakers https://tools.silversneakers.com  -Walk with a Doc: http://www.duncan-williams.com/  -Check out the [facility] Active Adults 50+ section on the [employer] (hiking clubs, book clubs, cards and games, chess, exercise classes, aquatic classes and much more) - see the website for details: https://www.[place].gov/departments/parks-recreation/active-adults50  -YouTube has lots of exercise videos for different ages and abilities as well  -[facility] (a variety of indoor and outdoor inperson activities for adults). [phone]. [address]  -Virtual Online Classes (a variety of topics): see  seniorplanet.org or call ([phone]) [phone]  -consider volunteering at a school, hospice center, church, senior center or elsewhere   ADVANCED HEALTHCARE DIRECTIVES:  [place] Advanced Directives assistance:   ExpressWeek.com.cy  Everyone should have advanced health care directives in place. This is so that you get the care you want, should you ever be in a situation where you are unable to make your own medical decisions.   From the [place] Advanced Directive Website: "Advance Health Care Directives are legal documents in which you give written instructions about your health care if, in the future, you cannot speak for yourself.   A health care power of attorney allows you to name a person you trust to make your health care decisions if you cannot make them yourself. A declaration of a desire for a natural death (or living will) is document, which states that you desire not to have your life prolonged by extraordinary measures if you have a terminal or incurable illness or if you are in a vegetative state. An advance instruction for mental health treatment makes a declaration of instructions, information and preferences regarding your mental health treatment. It also states that you are aware that the advance instruction authorizes a mental health treatment provider to act according to your wishes. It may also outline your consent or refusal of mental health treatment. A declaration of an anatomical gift allows anyone over the age of 64 to make a gift by will, organ donor card or other document."   Please see the following website or an elder law attorney for forms, FAQs and for  completion of advanced directives: Secretary/administrator Health Care Directives Advance Health Care Directives ([email or web address]/)  Or copy and paste the following to your web browser: [email or web address]

## 2023-08-22 ENCOUNTER — Other Ambulatory Visit: Payer: Self-pay | Admitting: Family Medicine

## 2023-08-31 ENCOUNTER — Encounter: Payer: Self-pay | Admitting: Family Medicine

## 2023-08-31 DIAGNOSIS — Z1231 Encounter for screening mammogram for malignant neoplasm of breast: Secondary | ICD-10-CM | POA: Diagnosis not present

## 2023-08-31 LAB — HM MAMMOGRAPHY

## 2023-09-21 ENCOUNTER — Ambulatory Visit (INDEPENDENT_AMBULATORY_CARE_PROVIDER_SITE_OTHER): Payer: Medicare PPO | Admitting: Podiatry

## 2023-09-21 DIAGNOSIS — M79674 Pain in right toe(s): Secondary | ICD-10-CM

## 2023-09-21 DIAGNOSIS — B351 Tinea unguium: Secondary | ICD-10-CM

## 2023-09-21 DIAGNOSIS — M79675 Pain in left toe(s): Secondary | ICD-10-CM

## 2023-09-21 DIAGNOSIS — Z7901 Long term (current) use of anticoagulants: Secondary | ICD-10-CM

## 2023-09-21 NOTE — Progress Notes (Unsigned)
Subjective: Chief Complaint  Patient presents with   [facility]    RM#11 [facility] patient has a concern left big toe nail growth. A49C 12.76    72 year old female presents the office today for thick, elongated toenails that she is unable to trim herself.  No open lesions and no other concerns today.   Last A1c was 6.2 on 07/04/2023 Swaz[place], [name], MD Last seen 07/27/2023  On Eliquis  Objective: AAO x3, NAD DP/PT pulses palpable bilaterally, CRT less than 3 seconds Nails are hypertrophic, dystrophic, brittle, discolored, elongated 10. No surrounding redness or drainage. Tenderness nails 1-5 bilaterally.  The prescription left hallux appears to have healed.  There is no edema, erythema, drainage or pus or signs of infection today. No pain with calf compression, swelling, warmth, erythema  Assessment: Symptomatic onychomycosis, on Eliquis  Plan: -All treatment options discussed with the patient including all alternatives, risks, complications.  -Sharply debrided the nails x 10 without any complications or bleeding.   -Daily foot inspection  Return in about 3 months (around 12/20/2023).  Vivi Barrack DPM

## 2023-10-01 ENCOUNTER — Other Ambulatory Visit (HOSPITAL_COMMUNITY): Payer: Self-pay | Admitting: Physician Assistant

## 2023-10-11 ENCOUNTER — Ambulatory Visit (HOSPITAL_COMMUNITY): Payer: Medicare PPO | Admitting: Physician Assistant

## 2023-10-22 ENCOUNTER — Other Ambulatory Visit: Payer: Self-pay | Admitting: Family Medicine

## 2023-10-22 DIAGNOSIS — E876 Hypokalemia: Secondary | ICD-10-CM

## 2023-10-24 DIAGNOSIS — L03116 Cellulitis of left lower limb: Secondary | ICD-10-CM | POA: Diagnosis not present

## 2023-11-01 ENCOUNTER — Encounter: Payer: Self-pay | Admitting: Cardiovascular Disease

## 2023-11-01 NOTE — Progress Notes (Signed)
Cardiology Office Note:    Date:  11/02/2023   ID:  [name], Radliff 1950-11-04, MRN [id number]  PCP:  Swaziland, [name], MD  Cardiologist:  Kristeen Miss, MD    Referring MD: [place], [name], MD   Chief Complaint  Patient presents with   Atrial Fibrillation        Problem list 1.  Sinus bradycardia 2.  Essential hypertension  10/16/17    [name] is a 73 y.o. female with a hx of  HTN. She has been on Atenolol - dose has been gradually decreased due to sinus bradycardia .  She was started on Lotrel ( amlodipine - benazepril  10-20 mg a day )  The Lotrel has caused her legs to swell and has pain in her bladder when she urinates.   Has also developed chills since she has been taking the Lotrel .   Has never had any syncope.   Has occasional "swimmy headedness"  and orthostasis .   Family hx of MI and strokes in her family   She eats fast foods and prepared foods 3-4 times a week. Walks every other day  - 1/4 mile a day   She fell on Dec. 11 ( was pushing her mother up a wheelchair ramp and fell in the ice )   December 05, 2017: [name] was seen today for follow-up of her high blood pressure.  Her blood pressure has been much better only. Is eating better,  Is sweating lots.    Was told to drink Gatorade several times a week.  Wt is 244 .   Has lost 9 lbs   Has not working out regularly  Has had some ortho  issues  Is eating out much less.   April 21, 2020:  Seen back after ~ 2 year absence  Has no energy. Had palpitations last night - lasted a few minutes.  Was in the bed so no CP , no dyspnea Has lost 4 lbs this past week .  Labs from her primary MD were reveiwed and look ok  Takes a teaspoon of salt 1-2 times a week for leg cramps.   She is now on some leg cramp medicine .  Was advised not to eat salt.  She eats fast foods 3 times a week,  [employer], [employer], Ms Lamount [employer], cook out  [employer] several times a week Eats bologna,  sausages  Aug. 3, 2022:  [name] is seen for follow up of her Afib  Is in Afib today , in on Eliquis   BP is well controlled.  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  She is not interested in starting anything else at this time .   December 12, 2021: [name] is seen for follow up of her atrial fib Is on eliqis Her mother passed away in 11/10/2021 Was seen in the Afib clinic on Jan. 26, 2023 She was in [facility] at the time On flecainide, atenolol 12.5 QD ( with an occasional extra 1/2 tab)  Wt is 245  Has fallen twice , is not sure she can walk now  October 31, 2022:  Has had some leg swelling  Is participating in PT  Her legs are very weak  Jan. 24, 2025 [name] is seen for follow up of her paroxysmal atrial fib  She has developed progressive weakness   Had sinus bradycardia with her last EKG on May 10, 2023. She was concerned because she occasionally finds heart rate in the upper 40s.  She is does not have any symptoms of dizziness or weakness with these episodes.     Past Medical History:  Diagnosis Date   Abdominal pain, epigastric 12/28/2008   Centricity Description: ABDOMINAL PAIN, EPIGASTRIC Qualifier: Diagnosis of  By: Misty Stanley CMA Duncan Dull), [name]   Centricity Description: ABDOMINAL PAIN-EPIGASTRIC Qualifier: Diagnosis of  By: Myrtie Hawk, [name]    Allergic rhinitis 09/16/2013   Allergy    Anemia    Anxiety    Anxiety disorder, unspecified 07/06/2017   Arthritis    Blood in stool    BMI 45.0-49.9, adult (HCC) 07/26/2016   Class 3 obesity with serious comorbidity and body mass index (BMI) of 45.0 to 49.9 in adult 03/07/2017   DDD (degenerative disc disease), lumbar    Diabetes mellitus without complication (HCC)    DIVERTICULOSIS-COLON 12/28/2008    Qualifier: Diagnosis of  By: Monica Becton PA-c, [name]    GERD 12/28/2008   Qualifier: Diagnosis of  By: Monica Becton PA-c, [name]    GERD (gastroesophageal reflux disease)    Headache    Hyperglycemia 03/17/2014   Hypertension    Hypokalemia 07/26/2016   IBS (irritable bowel syndrome)-C-D 07/26/2016   Obesity    Other and unspecified hyperlipidemia 03/17/2014   PERSONAL HX COLONIC POLYPS 12/28/2008   Qualifier: Diagnosis of  By: Monica Becton PA-c, [name]    Reflux esophagitis 12/28/2008   Qualifier: Diagnosis of  By: Misty Stanley CMA Duncan Dull), [name]     S/P lumbar spinal fusion 07/02/2012   Tubulovillous adenoma of colon 2008    Past Surgical History:  Procedure Laterality Date   ABDOMINAL HYSTERECTOMY     COLONOSCOPY  2014   laser surgery to remove scar     not sure of the year; after hyst   RADIOLOGY WITH ANESTHESIA N/A 02/25/2015   Procedure: MRI OF LUMBAR SPINE;  Surgeon: Medication Radiologist, MD;  Location: [facility] OR;  Service: Radiology;  Laterality: N/A;  DR. Jake Seats COHEN/MRI   SPINE SURGERY     TONSILLECTOMY AND ADENOIDECTOMY      Current Medications: Current Meds  Medication Sig   Accu-Chek Softclix Lancets lancets Use to check blood sugars 1-2 times daily.   amLODipine (NORVASC) 5 MG tablet Take 1 tablet by mouth once daily   apixaban (ELIQUIS) 5 MG TABS tablet Take 1 tablet by mouth twice daily   Aspirin-Salicylamide-Caffeine (BC FAST PAIN RELIEF) 650-195-33.3 MG PACK Take 1 tablet by mouth as needed.   atenolol (TENORMIN) 25 MG tablet TAKE 1/2 (ONE-HALF) TABLET BY MOUTH ONCE DAILY (MAY  TAKE  EXTRA  ONE-HALF  TABLET  FOR  BREAKTHROUGH  AFIB)   azelastine (ASTELIN) 0.1 % nasal spray Place 2 sprays into both nostrils 2 (two) times daily. Use in each nostril as directed (Patient taking differently: Place 2 sprays into both nostrils as needed. Use in each nostril as directed)   Blood Glucose Monitoring Suppl (ACCU-CHEK GUIDE ME) w/Device KIT USE TO CHECK GLUCOSE ONCE DAILY TO TWICE DAILY    calcium carbonate (TUMS EX) 750 MG chewable tablet Chew 2 tablets by mouth daily as needed for heartburn.   Cetirizine HCl (ZYRTEC ALLERGY PO) Take by mouth as needed.   ciclopirox (PENLAC) 8 % solution Apply topically at bedtime.  Apply over nail and surrounding skin. Apply daily over previous coat. After seven (7) days, may remove with alcohol and continue cycle.   cyclobenzaprine (FLEXERIL) 10 MG tablet Take 2.5 mg by mouth daily as needed (pain level 8 or greater).    Docusate Sodium (STOOL SOFTENER LAXATIVE PO) Take by mouth.   flecainide (TAMBOCOR) 100 MG tablet Take 1 tablet (100 mg total) by mouth 2 (two) times daily.   fluconazole (DIFLUCAN) 150 MG tablet Take 1 tablet (150 mg total) by mouth daily.   fluticasone (FLONASE) 50 MCG/ACT nasal spray Use 2 spray(s) in each nostril once daily   glucose blood (ACCU-CHEK GUIDE) test strip USE 1 STRIP ONCE TO  TWICE DAILY TO TEST BLOOD SUGARS   hydrochlorothiazide (HYDRODIURIL) 25 MG tablet Take 1 tablet by mouth once daily   HYDROcodone-acetaminophen (NORCO/VICODIN) 5-325 MG tablet Take 0.5 tablets by mouth daily as needed (pain level 8 or greater).   hydrocortisone 2.5 % ointment APPLY  OINTMENT TOPICALLY AS NEEDED   hydrOXYzine (ATARAX) 25 MG tablet TAKE 1 TABLET BY MOUTH THREE TIMES DAILY AS NEEDED FOR ANXIETY   lidocaine 4 % Place 1 patch onto the skin daily.   Menthol-Zinc Oxide (CALMOSEPTINE) 0.44-20.6 % OINT Apply topically.   Metamucil Fiber CHEW Chew by mouth as needed.   miconazole (REMEDY PHYTOPLEX ANTIFUNGAL) 2 % powder Apply topically as needed for itching.   Multiple Vitamins-Minerals (MULTI-VITAMIN GUMMIES PO) Take by mouth as needed.   omeprazole (PRILOSEC) 40 MG capsule Take 40 mg by mouth 2 (two) times daily.   Polyethylene Glycol 3350 (MIRALAX PO) Take by mouth as needed.   potassium chloride (KLOR-CON) 10 MEQ tablet Take 2 tablets by mouth once daily   pravastatin (PRAVACHOL) 20 MG tablet TAKE 1 TABLET BY MOUTH TWICE A  WEEK   Simethicone (GAS-X PO) Take by mouth as needed.   trolamine salicylate (ASPERCREME) 10 % cream Apply 1 application topically as needed for muscle pain.     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  Tobacco Use   Smoking status: Former    Current packs/day: 0.00    Average packs/day: 1 pack/day for 38.0 years (38.0 ttl pk-yrs)    Types: Cigarettes    Start date: 09/08/1960    Quit date: 09/08/1998    Years since quitting: 25.1   Smokeless tobacco: Never   Tobacco comments:    Former smoker 06/06/21  Vaping Use   Vaping status: Never Used  Substance and Sexual Activity   Alcohol use: No   Drug use: No   Sexual activity: Not Currently    Comment: Husband passed away in 12-17-98  Other Topics Concern   Not on file  Social History Narrative   Widow, 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   Social Drivers of Corporate investment banker Strain: Low Risk  (08/14/2022)   Overall Financial Resource Strain (CARDIA)    Difficulty of Paying Living Expenses: Not hard at all  Food Insecurity: No Food Insecurity (08/14/2022)   Hunger Vital Sign    Worried About Programme researcher, broadcasting/film/video in  the Last Year: Never true    Ran Out of Food in the Last Year: Never true  Transportation Needs: No Transportation Needs (08/14/2022)   PRAPARE - Administrator, Civil Service (Medical): No    Lack of Transportation (Non-Medical): No  Physical Activity: Insufficiently Active (08/11/2022)   Exercise Vital Sign    Days of Exercise per  Week: 5 days    Minutes of Exercise per Session: 20 min  Stress: No Stress Concern Present (08/11/2022)   [employer] of Occupational Health - Occupational Stress Questionnaire    Feeling of Stress : Not at all  Social Connections: Moderately Integrated (08/11/2022)   Social Connection and Isolation Panel [NHANES]    Frequency of Communication with Friends and Family: More than three times a week    Frequency of Social Gatherings with Friends and Family: More than three times a week    Attends Religious Services: More than 4 times per year    Active Member of [employer] or Organizations: Yes    Attends Banker Meetings: More than 4 times per year    Marital Status: Widowed     Family History: The patient's family history includes Arthritis in her father; Breast cancer in her cousin and paternal aunt; Cancer in her maternal grandfather; Cervical cancer in her sister; Diabetes in her sister; Heart disease in her sister; Hyperlipidemia in her father, mother, and sister; Hypertension in her father, mother, and paternal grandmother; Pancreatic cancer in her maternal aunt.  ROS:   As per current hx. Otherwise negatve    EKGs/Labs/Other Studies Reviewed:    The following studies were reviewed today:    Recent Labs: 11/09/2022: B Natriuretic Peptide 50.2 02/27/2023: BUN 17; Creatinine, Ser 0.96; Hemoglobin 12.3; Platelets 271.0; Potassium 3.6; Sodium 141; TSH 2.33  Recent Lipid Panel    Component Value Date/Time   CHOL 158 08/17/2021 1004   TRIG 89.0 08/17/2021 1004   HDL 54.10 08/17/2021 1004   CHOLHDL 3 08/17/2021 1004   VLDL 17.8 08/17/2021 1004   LDLCALC 86 08/17/2021 1004    Physical Exam: Blood pressure 126/80, pulse (!) 52, weight 250 lb 3.2 oz (113.5 kg), SpO2 94%.       GEN:  Well nourished, well developed in no acute distress HEENT: Normal NECK: No JVD; No carotid bruits LYMPHATICS: No lymphadenopathy CARDIAC: RRR , no murmurs, rubs,  gallops RESPIRATORY:  Clear to auscultation without rales, wheezing or rhonchi  ABDOMEN: Soft, non-tender, non-distended MUSCULOSKELETAL:  No edema; No deformity  SKIN: Warm and dry NEUROLOGIC:  Alert and oriented x 3   EKG    ASSESSMENT:    1. Paroxysmal atrial fibrillation (HCC)   2. Essential hypertension      PLAN:    1.  Atrial fib:    has PAF .  Cont eliquis . HR is slow . She is on atenolol 12.5 a day .   She has palpitations occasionally . I would like to continue atenolol for now.  If she develops any syncope or presyncope, we will likely need to stop     2.  Essential hypertension:    recheck BP was normal .  Continue current meds.    3.  Obesity:    encouraged weight loss     4.  Fatigue:       Medication Adjustments/Labs and Tests Ordered: Current medicines are reviewed at length with the patient today.  Concerns regarding medicines are outlined above.  No orders of the defined  types were placed in this encounter.     No orders of the defined types were placed in this encounter.    Signed, Kristeen Miss, MD  11/02/2023 11:27 AM    [facility]

## 2023-11-02 ENCOUNTER — Ambulatory Visit: Payer: Medicare PPO | Attending: Cardiovascular Disease | Admitting: Cardiovascular Disease

## 2023-11-02 ENCOUNTER — Encounter: Payer: Self-pay | Admitting: Cardiovascular Disease

## 2023-11-02 ENCOUNTER — Other Ambulatory Visit: Payer: Self-pay

## 2023-11-02 VITALS — BP 126/80 | HR 52 | Wt 250.2 lb

## 2023-11-02 DIAGNOSIS — I10 Essential (primary) hypertension: Secondary | ICD-10-CM

## 2023-11-02 DIAGNOSIS — I480 Paroxysmal atrial fibrillation: Secondary | ICD-10-CM | POA: Diagnosis not present

## 2023-11-02 NOTE — Patient Instructions (Signed)
Medication Instructions:  Your physician recommends that you continue on your current medications as directed. Please refer to the Current Medication list given to you today.  *If you need a refill on your cardiac medications before your next appointment, please call your pharmacy*  Lab Work: If you have labs (blood work) drawn today and your tests are completely normal, you will receive your results only by: MyChart Message (if you have MyChart) OR A paper copy in the mail If you have any lab test that is abnormal or we need to change your treatment, we will call you to review the results.  Testing/Procedures: None ordered today.  Follow-Up: At [facility], you and your health needs are our priority.  As part of our continuing mission to provide you with exceptional heart care, we have created designated Provider Care Teams.  These Care Teams include your primary Cardiologist (physician) and Advanced Practice Providers (APPs -  Physician Assistants and Nurse Practitioners) who all work together to provide you with the care you need, when you need it.  We recommend signing up for the patient portal called "MyChart".  Sign up information is provided on this After Visit Summary.  MyChart is used to connect with patients for Virtual Visits (Telemedicine).  Patients are able to view lab/test results, encounter notes, upcoming appointments, etc.  Non-urgent messages can be sent to your provider as well.   To learn more about what you can do with MyChart, go to [email or web address].    Your next appointment:   1 year(s)  Provider:   Dr. Odis Hollingshead   Other Instructions   1st Floor: - Lobby - Registration  - Pharmacy  - Lab - Cafe  2nd Floor: - PV Lab - Diagnostic Testing (echo, CT, nuclear med)  3rd Floor: - Vacant  4th Floor: - TCTS (cardiothoracic surgery) - AFib Clinic - Structural Heart Clinic - Vascular Surgery  - Vascular Ultrasound  5th Floor: - HeartCare  Cardiology (general and EP) - Clinical Pharmacy for coumadin, hypertension, lipid, weight-loss medications, and med management appointments    Valet parking services will be available as well.

## 2023-11-14 ENCOUNTER — Ambulatory Visit (HOSPITAL_COMMUNITY)
Admission: RE | Admit: 2023-11-14 | Discharge: 2023-11-14 | Disposition: A | Discharge status: Home / Self Care | Payer: Medicare PPO | Source: Ambulatory Visit | Attending: Physician Assistant | Admitting: Physician Assistant

## 2023-11-14 VITALS — BP 150/80 | HR 60 | Ht 61.0 in | Wt 247.2 lb

## 2023-11-14 DIAGNOSIS — Z5181 Encounter for therapeutic drug level monitoring: Secondary | ICD-10-CM | POA: Diagnosis not present

## 2023-11-14 DIAGNOSIS — Z7901 Long term (current) use of anticoagulants: Secondary | ICD-10-CM | POA: Diagnosis not present

## 2023-11-14 DIAGNOSIS — I440 Atrioventricular block, first degree: Secondary | ICD-10-CM | POA: Diagnosis not present

## 2023-11-14 DIAGNOSIS — Z6841 Body Mass Index (BMI) 40.0 and over, adult: Secondary | ICD-10-CM | POA: Diagnosis not present

## 2023-11-14 DIAGNOSIS — Z79899 Other long term (current) drug therapy: Secondary | ICD-10-CM | POA: Diagnosis not present

## 2023-11-14 DIAGNOSIS — I480 Paroxysmal atrial fibrillation: Secondary | ICD-10-CM

## 2023-11-14 DIAGNOSIS — E669 Obesity, unspecified: Secondary | ICD-10-CM | POA: Diagnosis not present

## 2023-11-14 DIAGNOSIS — E785 Hyperlipidemia, unspecified: Secondary | ICD-10-CM | POA: Insufficient documentation

## 2023-11-14 DIAGNOSIS — I10 Essential (primary) hypertension: Secondary | ICD-10-CM | POA: Insufficient documentation

## 2023-11-14 DIAGNOSIS — D6869 Other thrombophilia: Secondary | ICD-10-CM

## 2023-11-14 NOTE — Progress Notes (Signed)
 Primary Care Physician: Jordan, [name], MD Primary Cardiologist: Dr Alveta Primary Electrophysiologist: none Referring Physician: Dr Alveta [name] is a 73 y.o. female with a history of HTN, HLD, and atrial fibrillation who presents for follow up in the [facility].  The patient was initially diagnosed with atrial fibrillation 04/2020 after presenting to the her PCP office with palpitations. She also wore a 30 day event monitor which showed a 7% afib burden. Patient is on Eliquis  for a CHADS2VASC score of 4. She admits to snoring and daytime somnolence but has declined sleep study.   Patient returns for follow up for atrial fibrillation and flecainide  monitoring. She reports that she had tachypalpitations after being on a short course of steroids but this has resolved. She has intermittent rectal bleeding from chronic hemorrhoids.   Today, he denies symptoms of chest pain, shortness of breath, orthopnea, PND, lower extremity edema, dizziness, presyncope, syncope, or neurologic sequela. The patient is tolerating medications without difficulties and is otherwise without complaint today.    Atrial Fibrillation Risk Factors:  she does have symptoms or diagnosis of sleep apnea. She declines sleep study. she does not have a history of rheumatic fever. she does not have a history of alcohol use.   Atrial Fibrillation Management history:  Previous antiarrhythmic drugs: flecainide   Previous cardioversions: none Previous ablations: none Anticoagulation history: Eliquis    Past Medical History:  Diagnosis Date   Abdominal pain, epigastric 12/28/2008   Centricity Description: ABDOMINAL PAIN, EPIGASTRIC Qualifier: Diagnosis of  By: Earlean CMA LEODIS), [name]   Centricity Description: ABDOMINAL PAIN-EPIGASTRIC Qualifier: Diagnosis of  By: Ever Riggers, [name]    Allergic rhinitis 09/16/2013   Allergy    Anemia    Anxiety    Anxiety disorder,  unspecified 07/06/2017   Arthritis    Blood in stool    BMI 45.0-49.9, adult (HCC) 07/26/2016   Class 3 obesity with serious comorbidity and body mass index (BMI) of 45.0 to 49.9 in adult 03/07/2017   DDD (degenerative disc disease), lumbar    Diabetes mellitus without complication (HCC)    DIVERTICULOSIS-COLON 12/28/2008   Qualifier: Diagnosis of  By: Ever PA-c, [name]    GERD 12/28/2008   Qualifier: Diagnosis of  By: Ever PA-c, [name]    GERD (gastroesophageal reflux disease)    Headache    Hyperglycemia 03/17/2014   Hypertension    Hypokalemia 07/26/2016   IBS (irritable bowel syndrome)-C-D 07/26/2016   Obesity    Other and unspecified hyperlipidemia 03/17/2014   PERSONAL HX COLONIC POLYPS 12/28/2008   Qualifier: Diagnosis of  By: Ever PA-c, [name]    Reflux esophagitis 12/28/2008   Qualifier: Diagnosis of  By: Earlean CMA (AAMA), [name]     S/P lumbar spinal fusion 07/02/2012   Tubulovillous adenoma of colon 2008    ROS- All systems are reviewed and negative except as per the HPI above.  Physical Exam: Vitals:   11/14/23 0924  BP: (!) 150/80  Pulse: 60  Weight: 112.1 kg  Height: 5' 1 (1.549 m)     GEN: Well nourished, well developed in no acute distress CARDIAC: Regular rate and rhythm, no murmurs, rubs, gallops RESPIRATORY:  Clear to auscultation without rales, wheezing or rhonchi  ABDOMEN: Soft, non-tender, non-distended EXTREMITIES:  No edema; No deformity    Wt Readings from Last 3 Encounters:  11/14/23 112.1 kg  11/02/23 113.5 kg  08/16/23 110.7 kg    EKG today demonstrates  SR, 1st degree AV block Vent. rate 60 BPM PR interval 206 ms QRS duration 116 ms QT/QTcB 472/472 ms   Echo 05/11/20 demonstrated   1. Left ventricular ejection fraction, by estimation, is 60 to 65%. The left ventricle has normal function. The left ventricle has no regional wall motion abnormalities. Left ventricular diastolic parameters were normal.   2. Right  ventricular systolic function is normal. The right ventricular  size is normal. There is normal pulmonary artery systolic pressure.   3. The mitral valve is normal in structure. Trivial mitral valve  regurgitation. No evidence of mitral stenosis.   4. The aortic valve is tricuspid. Aortic valve regurgitation is not  visualized. Mild aortic valve sclerosis is present, with no evidence of aortic valve stenosis.   5. The inferior vena cava is normal in size with greater than 50%  respiratory variability, suggesting right atrial pressure of 3 mmHg.   Epic records are reviewed at length today  CHA2DS2-VASc Score = 4  The patient's score is based upon: CHF History: 0 HTN History: 1 Diabetes History: 1 Stroke History: 0 Vascular Disease History: 0 Age Score: 1 Gender Score: 1       ASSESSMENT AND PLAN: Paroxysmal Atrial Fibrillation/atypical atrial flutter The patient's CHA2DS2-VASc score is 4, indicating a 4.8% annual risk of stroke.   Patient appears to be maintaining SR Continue flecainide  100 mg BID Continue Eliquis  5 mg BID Continue atenolol  12.5 mg daily  Secondary Hypercoagulable State (ICD10:  D68.69) The patient is at significant risk for stroke/thromboembolism based upon her CHA2DS2-VASc Score of 4.  Continue Apixaban  (Eliquis ). Intermittent hemorrhoidal bleeding. Hgb has remained stable.   High Risk Medication Monitoring (ICD 10: Z79.899) Intervals on ECG appropriate for flecainide  monitoring  Obesity Body mass index is 46.71 kg/m.  Encouraged lifestyle modification  HTN Mildly elevated today. Patient will keep log at home for review with PCP.   Follow up in the AF clinic in 6 months.    Daril Kicks PA-C Afib Clinic [facility] [address] [place], [place] [address] [phone] 11/14/2023 9:42 AM

## 2023-11-20 ENCOUNTER — Encounter: Payer: Self-pay | Admitting: Podiatry

## 2023-11-20 ENCOUNTER — Ambulatory Visit: Payer: Medicare PPO | Admitting: Podiatry

## 2023-11-20 DIAGNOSIS — B351 Tinea unguium: Secondary | ICD-10-CM

## 2023-11-20 DIAGNOSIS — L853 Xerosis cutis: Secondary | ICD-10-CM

## 2023-11-21 NOTE — Progress Notes (Signed)
Subjective: Chief Complaint  Patient presents with   Wound Check    RM#12 Follow up on skin was treated at urgent care given antibiotics no redness or cracking today just dry skin.    73 year old female presents the office today for concerns of dry skin to her heels.  She said that she was trying to trim it previously and it caused bleeding as she is on blood thinners.  She was seen in urgent care was placed on antibiotics but she felt the dose was too strong so she only took it once a day for 5 days.  She does not report any open lesions this time or swelling or redness.  She has not had any fevers or chills.  No other concerns.  Last A1c was 6.2 on 07/04/2023 [place], [name], MD Last seen 07/27/2023  On Eliquis  Objective: AAO x3, NAD DP/PT pulses palpable bilaterally, CRT less than 3 seconds Nails are hypertrophic, dystrophic, brittle, discolored, elongated 10. No surrounding redness or drainage. Tenderness nails 1-5 bilaterally.  The prescription left hallux appears to have healed.  There is no edema, erythema, drainage or pus or signs of infection today. Dry skin present to the heel left side worse than right but there is no skin fissures, open sores identified today. No pain with calf compression, swelling, warmth, erythema  Assessment: Dry skin symptomatic onychomycosis, on Eliquis  Plan: -All treatment options discussed with the patient including all alternatives, risks, complications.  -There is no open lesions defined there is no signs of infection but she is to monitor very closely.  Asked her not to try to trim the skin herself particular she is on anticoagulation therapy.  There is not significant tissue debrided today as warm dry skin.  We discussed different Moisturizer That She Can Use under Occlusion to Help with Her Heels for Short Amount of Time. -Sharply debrided the nails x 10 without any complications or bleeding as a courtesy as there is tenderness and elongated  while she was here. -Daily foot inspection  Return in about 2 months (around 01/21/2024).  Vivi Barrack DPM

## 2023-12-10 ENCOUNTER — Other Ambulatory Visit: Payer: Self-pay | Admitting: Family Medicine

## 2023-12-20 ENCOUNTER — Ambulatory Visit: Payer: Medicare PPO | Admitting: Podiatry

## 2023-12-25 ENCOUNTER — Other Ambulatory Visit (HOSPITAL_COMMUNITY): Payer: Self-pay | Admitting: Physician Assistant

## 2023-12-25 ENCOUNTER — Other Ambulatory Visit: Payer: Self-pay | Admitting: Family Medicine

## 2023-12-25 DIAGNOSIS — I10 Essential (primary) hypertension: Secondary | ICD-10-CM

## 2023-12-31 ENCOUNTER — Encounter: Payer: Self-pay | Admitting: Family Medicine

## 2024-01-01 ENCOUNTER — Ambulatory Visit: Payer: Medicare PPO | Admitting: Family Medicine

## 2024-01-01 ENCOUNTER — Encounter: Payer: Self-pay | Admitting: Family Medicine

## 2024-01-01 VITALS — BP 132/80 | HR 71 | Temp 98.2°F | Resp 16 | Ht 61.0 in | Wt 250.8 lb

## 2024-01-01 DIAGNOSIS — Z6841 Body Mass Index (BMI) 40.0 and over, adult: Secondary | ICD-10-CM | POA: Diagnosis not present

## 2024-01-01 DIAGNOSIS — E1169 Type 2 diabetes mellitus with other specified complication: Secondary | ICD-10-CM

## 2024-01-01 DIAGNOSIS — I10 Essential (primary) hypertension: Secondary | ICD-10-CM | POA: Diagnosis not present

## 2024-01-01 DIAGNOSIS — E785 Hyperlipidemia, unspecified: Secondary | ICD-10-CM

## 2024-01-01 LAB — COMPREHENSIVE METABOLIC PANEL
ALT: 16 U/L (ref 0–35)
AST: 24 U/L (ref 0–37)
Albumin: 4.1 g/dL (ref 3.5–5.2)
Alkaline Phosphatase: 67 U/L (ref 39–117)
BUN: 22 mg/dL (ref 6–23)
CO2: 31 meq/L (ref 19–32)
Calcium: 9.9 mg/dL (ref 8.4–10.5)
Chloride: 102 meq/L (ref 96–112)
Creatinine, Ser: 1.26 mg/dL — ABNORMAL HIGH (ref 0.40–1.20)
GFR: 43.28 mL/min — ABNORMAL LOW (ref >60.00)
Glucose, Bld: 110 mg/dL — ABNORMAL HIGH (ref 70–99)
Potassium: 3.8 meq/L (ref 3.5–5.1)
Sodium: 142 meq/L (ref 135–145)
Total Bilirubin: 0.2 mg/dL (ref 0.2–1.2)
Total Protein: 6.8 g/dL (ref 6.0–8.3)

## 2024-01-01 LAB — LIPID PANEL
Cholesterol: 165 mg/dL (ref 0–200)
HDL: 50.5 mg/dL (ref >39.00)
LDL Cholesterol: 92 mg/dL (ref 0–99)
NonHDL: 114.99
Total CHOL/HDL Ratio: 3
Triglycerides: 115 mg/dL (ref 0.0–149.0)
VLDL: 23 mg/dL (ref 0.0–40.0)

## 2024-01-01 LAB — POCT GLYCOSYLATED HEMOGLOBIN (HGB A1C): HbA1c, POC (prediabetic range): 6 % (ref 5.7–6.4)

## 2024-01-01 LAB — MICROALBUMIN / CREATININE URINE RATIO
Creatinine,U: 92.9 mg/dL
Microalb Creat Ratio: UNDETERMINED mg/g (ref 0.0–30.0)
Microalb, Ur: <0.7 mg/dL

## 2024-01-01 NOTE — Progress Notes (Signed)
 HPI: [name] is a 73 y.o. female with a PMHx significant for HTN, HLD, DM II, atrial fibrillation, IBS, and chronic pain, who is here today for chronic disease management.  Last seen on 07/27/2023  Hypertension/atrial fibrillation:  Medications: Currently on amlodipine 5 mg daily and hydrochlorothiazide 25 mg daily. Also on Flecainide 100 mg twice daily and atenolol 12.5 mg daily for atrial fibrillation.  BP readings at home: She has been checking her BP at home. Her highest reading was 160/72. Her lowest was 115/68.  Side effects: she has had bradycardia with the atenolol in the past. Negative for unusual or severe headache, visual changes, exertional chest pain, dyspnea,  focal weakness, or edema.  Lab Results  Component Value Date   CREATININE 0.96 02/27/2023   BUN 17 02/27/2023   NA 141 02/27/2023   K 3.6 02/27/2023   CL 102 02/27/2023   CO2 29 02/27/2023   Hyperlipidemia: Currently on pravastatin 20 mg twice weekly.  Side effects from medication: none Lab Results  Component Value Date   CHOL 158 08/17/2021   HDL 54.10 08/17/2021   LDLCALC 86 08/17/2021   TRIG 89.0 08/17/2021   CHOLHDL 3 08/17/2021   Diabetes Mellitus II:  - Checking BG at home: She has been checking her blood sugar regularly. She says when she is taking steroids, it can get as high as 200. She last took a steroid 3-4 months ago and is planning to start another one soon.  - Medications: Not currently on pharmacologic treatment.  - Diet: She is cooking more frequently at home.  - Exercise: She walks on her treadmill twice per week.  - eye exam: 07/2023 - foot exam: 03/2023 - Negative for symptoms of hypoglycemia, polyuria, polydipsia, numbness extremities, foot ulcers/trauma  Lab Results  Component Value Date   HGBA1C 6.2 07/04/2023   Lab Results  Component Value Date   MICROALBUR 2.2 (H) 02/27/2023   She had another fall since her last visit.   Review of Systems See other  pertinent positives and negatives in HPI.  Current Outpatient Medications on File Prior to Visit  Medication Sig Dispense Refill   Accu-Chek Softclix Lancets lancets Use to check blood sugars 1-2 times daily. 100 each 12   amLODipine (NORVASC) 5 MG tablet Take 1 tablet by mouth once daily 90 tablet 2   apixaban (ELIQUIS) 5 MG TABS tablet Take 1 tablet by mouth twice daily 180 tablet 2   Aspirin-Salicylamide-Caffeine (BC FAST PAIN RELIEF) 650-195-33.3 MG PACK Take 1 tablet by mouth as needed.     atenolol (TENORMIN) 25 MG tablet TAKE 1/2 (ONE-HALF) TABLET BY MOUTH ONCE DAILY (MAY  TAKE  EXTRA  ONE-HALF  TABLET  FOR  BREAKTHROUGH  AFIB) 90 tablet 0   azelastine (ASTELIN) 0.1 % nasal spray Place 2 sprays into both nostrils 2 (two) times daily. Use in each nostril as directed (Patient taking differently: Place 2 sprays into both nostrils as needed. Use in each nostril as directed) 30 mL 6   Blood Glucose Monitoring Suppl (ACCU-CHEK GUIDE ME) w/Device KIT USE TO CHECK GLUCOSE ONCE DAILY TO TWICE DAILY 1 kit 0   calcium carbonate (TUMS EX) 750 MG chewable tablet Chew 2 tablets by mouth daily as needed for heartburn.     Cetirizine HCl (ZYRTEC ALLERGY PO) Take 10 mg by mouth as needed.     ciclopirox (PENLAC) 8 % solution Apply topically at bedtime. Apply over nail and surrounding skin. Apply daily over previous coat. After  seven (7) days, may remove with alcohol and continue cycle. 6.6 mL 2   cyclobenzaprine (FLEXERIL) 5 MG tablet Take 2.5 mg by mouth daily as needed (pain level 8 or greater).     Docusate Sodium (STOOL SOFTENER LAXATIVE PO) Take by mouth as needed.     flecainide (TAMBOCOR) 100 MG tablet Take 1 tablet by mouth twice daily 180 tablet 1   fluticasone (FLONASE) 50 MCG/ACT nasal spray Use 2 spray(s) in each nostril once daily 48 g 0   glucose blood (ACCU-CHEK GUIDE) test strip USE 1 STRIP ONCE TO  TWICE DAILY TO TEST BLOOD SUGARS 100 each 2   hydrochlorothiazide (HYDRODIURIL) 25 MG tablet  Take 1 tablet by mouth once daily 90 tablet 2   HYDROcodone-acetaminophen (NORCO/VICODIN) 5-325 MG tablet Take 0.5 tablets by mouth daily as needed (pain level 8 or greater). 30 tablet 0   hydrocortisone 2.5 % ointment APPLY  OINTMENT TOPICALLY AS NEEDED 58 g 0   hydrOXYzine (ATARAX) 25 MG tablet TAKE 1 TABLET BY MOUTH THREE TIMES DAILY AS NEEDED FOR ANXIETY 90 tablet 1   lidocaine 4 % Place 1 patch onto the skin daily.     Menthol-Zinc Oxide (CALMOSEPTINE) 0.44-20.6 % OINT Apply topically as needed.     Metamucil Fiber CHEW Chew by mouth as needed.     Multiple Vitamins-Minerals (MULTI-VITAMIN GUMMIES PO) Take by mouth as needed.     omeprazole (PRILOSEC) 40 MG capsule Take 40 mg by mouth 2 (two) times daily.     Polyethylene Glycol 3350 (MIRALAX PO) Take by mouth as needed.     potassium chloride (KLOR-CON) 10 MEQ tablet Take 2 tablets by mouth once daily 180 tablet 0   pravastatin (PRAVACHOL) 20 MG tablet TAKE 1 TABLET BY MOUTH TWICE A WEEK 27 tablet 2   Simethicone (GAS-X PO) Take by mouth as needed.     trolamine salicylate (ASPERCREME) 10 % cream Apply 1 application topically as needed for muscle pain.     No current facility-administered medications on file prior to visit.    Past Medical History:  Diagnosis Date   Abdominal pain, epigastric 12/28/2008   Centricity Description: ABDOMINAL PAIN, EPIGASTRIC Qualifier: Diagnosis of  By: Misty Stanley CMA Duncan Dull), [name]   Centricity Description: ABDOMINAL PAIN-EPIGASTRIC Qualifier: Diagnosis of  By: Myrtie Hawk, [name]    Allergic rhinitis 09/16/2013   Allergy    Anemia    Anxiety    Anxiety disorder, unspecified 07/06/2017   Arthritis    Blood in stool    BMI 45.0-49.9, adult (HCC) 07/26/2016   Class 3 obesity with serious comorbidity and body mass index (BMI) of 45.0 to 49.9 in adult 03/07/2017   DDD (degenerative disc disease), lumbar    Diabetes mellitus without complication (HCC)    DIVERTICULOSIS-COLON 12/28/2008   Qualifier:  Diagnosis of  By: Monica Becton PA-c, [name]    GERD 12/28/2008   Qualifier: Diagnosis of  By: Monica Becton PA-c, [name]    GERD (gastroesophageal reflux disease)    Headache    Hyperglycemia 03/17/2014   Hypertension    Hypokalemia 07/26/2016   IBS (irritable bowel syndrome)-C-D 07/26/2016   Obesity    Other and unspecified hyperlipidemia 03/17/2014   PERSONAL HX COLONIC POLYPS 12/28/2008   Qualifier: Diagnosis of  By: Monica Becton PA-c, [name]    Reflux esophagitis 12/28/2008   Qualifier: Diagnosis of  By: Misty Stanley CMA (AAMA), [name]     S/P lumbar spinal fusion 07/02/2012   Tubulovillous adenoma of colon 2008  Allergies  Allergen Reactions   Crestor [Rosuvastatin]    Erythromycin     rash   Jublia [Efinaconazole] Hives    Redness/burning around nail   Lansoprazole    Penicillins     rash   Potassium-Containing Compounds Other (See Comments)    Stomach upset, can take pills, but not the packets    Robaxin [Methocarbamol]     Messed up stomach   Tea Tree Oil     Redness, burning (localized)   Trental [Pentoxifylline Er]     Bad headache   Sulfonamide Derivatives Rash    rash    Social History   Socioeconomic History   Marital status: Widowed    Spouse name: Not on file   Number of children: Not on file   Years of education: Not on file   Highest education level: Some college, no degree  Occupational History   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  Tobacco Use   Smoking status: Former    Current packs/day: 0.00    Average packs/day: 1 pack/day for 38.0 years (38.0 ttl pk-yrs)    Types: Cigarettes    Start date: 09/08/1960    Quit date: 09/08/1998    Years since quitting: 25.3   Smokeless tobacco: Never   Tobacco comments:    Former smoker 06/06/21  Vaping Use   Vaping status: Never Used  Substance and Sexual Activity   Alcohol use: No   Drug use: No   Sexual activity: Not Currently    Comment: Husband passed away in 01-16-1999  Other Topics Concern   Not on file   Social History Narrative   Widow, 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   Social Drivers of Corporate investment banker Strain: Low Risk  (12/28/2023)   Overall Financial Resource Strain (CARDIA)    Difficulty of Paying Living Expenses: Not hard at all  Food Insecurity: No Food Insecurity (12/28/2023)   Hunger Vital Sign    Worried About Running Out of Food in the Last Year: Never true    Ran Out of Food in the Last Year: Never true  Transportation Needs: No Transportation Needs (12/28/2023)   PRAPARE - Administrator, Civil Service (Medical): No    Lack of Transportation (Non-Medical): No  Physical Activity: Unknown (12/28/2023)   Exercise Vital Sign    Days of Exercise per Week: 0 days    Minutes of Exercise per Session: Not on file  Stress: No Stress Concern Present (12/28/2023)   [employer] of Occupational Health - Occupational Stress Questionnaire    Feeling of Stress : Only a little  Social Connections: Moderately Integrated (12/28/2023)   Social Connection and Isolation Panel [NHANES]    Frequency of Communication with Friends and Family: More than three times a week    Frequency of Social Gatherings with Friends and Family: Once a week    Attends Religious Services: More than 4 times per year    Active Member of [employer] or Organizations: Yes    Attends Banker Meetings: More than 4 times per year    Marital Status: Widowed    Vitals:   01/01/24 1100  BP: 132/80  Pulse: 71  Resp: 16  Temp: 98.2 F (36.8 C)  SpO2: 93%   Body mass index is 47.39 kg/m.  Physical Exam Vitals and nursing note reviewed.  Constitutional:      General: She is not in acute distress.    Appearance: She is well-developed.  HENT:  Head: Normocephalic and atraumatic.     Mouth/Throat:     Mouth: Mucous membranes are moist.     Pharynx: Oropharynx is clear.  Eyes:     Conjunctiva/sclera: Conjunctivae normal.  Cardiovascular:     Rate and Rhythm: Normal rate  and regular rhythm.     Pulses:          Dorsalis pedis pulses are 2+ on the right side and 2+ on the left side.     Heart sounds: No murmur heard. Pulmonary:     Effort: Pulmonary effort is normal. No respiratory distress.     Breath sounds: Normal breath sounds.  Abdominal:     Palpations: Abdomen is soft. There is no mass.     Tenderness: There is no abdominal tenderness. There is no right CVA tenderness or left CVA tenderness.  Musculoskeletal:     Right lower leg: 1+ Pitting Edema present.     Left lower leg: 1+ Pitting Edema present.  Lymphadenopathy:     Cervical: No cervical adenopathy.  Skin:    General: Skin is warm.     Findings: No erythema or rash.  Neurological:     Mental Status: She is alert and oriented to person, place, and time.     Cranial Nerves: No cranial nerve deficit.     Comments: Antalgic, unstable gait assisted with a cane.  Psychiatric:        Mood and Affect: Mood and affect normal.    ASSESSMENT AND PLAN:  Ms. Schwandt was seen today for chronic disease management.   Orders Placed This Encounter  Procedures   Comprehensive metabolic panel   Microalbumin / creatinine urine ratio   Lipid panel   POC HgB A1c   Lab Results  Component Value Date   HGBA1C 6.0 01/01/2024   Type 2 diabetes mellitus with other specified complication, without long-term current use of insulin (HCC) Assessment & Plan: Problem is adequately controlled. Continue non pharmacologic treatment. Continue annual eye exam, periodic dental and appropriate foot care. F/U in 5-6 months.  Orders: -     POCT glycosylated hemoglobin (Hb A1C) -     Comprehensive metabolic panel; Future -     Microalbumin / creatinine urine ratio; Future  Hyperlipidemia associated with type 2 diabetes mellitus (HCC) Assessment & Plan: Continue pravastatin 20 mg daily and low-fat diet. Last LDL 86 in 08/2021. Further recommendation will be given according to lipid panel result.  Orders: -      Lipid panel; Future  Essential hypertension Assessment & Plan: BP has been elevated at home. Recommend increasing dose of amlodipine from 5 mg to 7.5 mg daily. Continue HCTZ 25 mg daily,Atenolol 25 mg daily, and low-salt diet. Continue monitoring BP at home regularly. Eye exam is current.  Orders: -     Comprehensive metabolic panel; Future  Morbid obesity with BMI of 45.0-49.9, adult [facility]) Assessment & Plan: Encouraged consistency with regular physical activity as tolerated and with following a healthful diet.      Return in about 6 months (around 07/03/2024) for chronic problems.  I, Rolla Etienne Wierda, acting as a scribe for Nylan Nevel [place], MD., have documented all relevant documentation on the behalf of Terressa Evola [place], MD, as directed by  Tres Grzywacz [place], MD while in the presence of Priseis Cratty [place], MD.   I, Nikolis Berent [place], MD, have reviewed all documentation for this visit. The documentation on 01/01/24 for the exam, diagnosis, procedures, and orders are all accurate and complete.  Vandy Fong G. [place],  MD  [facility]. [place] office.

## 2024-01-01 NOTE — Assessment & Plan Note (Signed)
 Problem is adequately controlled. Continue non pharmacologic treatment. Continue annual eye exam, periodic dental and appropriate foot care. F/U in 5-6 months.

## 2024-01-01 NOTE — Assessment & Plan Note (Signed)
 BP has been elevated at home. Recommend increasing dose of amlodipine from 5 mg to 7.5 mg daily. Continue HCTZ 25 mg daily,Atenolol 25 mg daily, and low-salt diet. Continue monitoring BP at home regularly. Eye exam is current.

## 2024-01-01 NOTE — Assessment & Plan Note (Signed)
 Continue pravastatin 20 mg daily and low-fat diet. Last LDL 86 in 08/2021. Further recommendation will be given according to lipid panel result.

## 2024-01-01 NOTE — Assessment & Plan Note (Signed)
 Encouraged consistency with regular physical activity as tolerated and with following a healthful diet.

## 2024-01-01 NOTE — Patient Instructions (Addendum)
 A few things to remember from today's visit:  Essential hypertension - Plan: Comprehensive metabolic panel  Type 2 diabetes mellitus with other specified complication, without long-term current use of insulin (HCC) - Plan: POC HgB A1c, Comprehensive metabolic panel, Microalbumin / creatinine urine ratio  Hyperlipidemia associated with type 2 diabetes mellitus (HCC)  Increase Amlodipine from 5 mg to 7.5 mg daily (1.5 tab). No changes in rest. Continue monitoring blood pressure at home, ideally it should be close or under 130/80.  If you need refills for medications you take chronically, please call your pharmacy. Do not use My Chart to request refills or for acute issues that need immediate attention. If you send a my chart message, it may take a few days to be addressed, specially if I am not in the office.  Please be sure medication list is accurate. If a new problem present, please set up appointment sooner than planned today.

## 2024-01-03 ENCOUNTER — Other Ambulatory Visit (HOSPITAL_COMMUNITY): Payer: Self-pay | Admitting: Physician Assistant

## 2024-01-07 ENCOUNTER — Other Ambulatory Visit: Payer: Self-pay

## 2024-01-07 MED ORDER — PRAVASTATIN SODIUM 40 MG PO TABS: ORAL_TABLET | ORAL | 3 refills | Status: DC

## 2024-01-15 ENCOUNTER — Other Ambulatory Visit: Payer: Self-pay | Admitting: Family Medicine

## 2024-01-16 DIAGNOSIS — H40013 Open angle with borderline findings, low risk, bilateral: Secondary | ICD-10-CM | POA: Diagnosis not present

## 2024-01-16 DIAGNOSIS — H40053 Ocular hypertension, bilateral: Secondary | ICD-10-CM | POA: Diagnosis not present

## 2024-01-18 ENCOUNTER — Other Ambulatory Visit: Payer: Self-pay | Admitting: Family Medicine

## 2024-01-18 ENCOUNTER — Ambulatory Visit (INDEPENDENT_AMBULATORY_CARE_PROVIDER_SITE_OTHER): Payer: Medicare PPO | Admitting: Podiatry

## 2024-01-18 ENCOUNTER — Encounter: Payer: Self-pay | Admitting: Podiatry

## 2024-01-18 DIAGNOSIS — B351 Tinea unguium: Secondary | ICD-10-CM

## 2024-01-18 DIAGNOSIS — M79674 Pain in right toe(s): Secondary | ICD-10-CM | POA: Diagnosis not present

## 2024-01-18 DIAGNOSIS — Z7901 Long term (current) use of anticoagulants: Secondary | ICD-10-CM

## 2024-01-18 DIAGNOSIS — M79675 Pain in left toe(s): Secondary | ICD-10-CM

## 2024-01-18 DIAGNOSIS — E876 Hypokalemia: Secondary | ICD-10-CM

## 2024-01-20 NOTE — Progress Notes (Signed)
 Subjective: Chief Complaint  Patient presents with   [facility]    RM#12 [facility] patient has no concerns today .      73 year old female presents the office today for concerns of thick, elongated nails that she is not able to trim herself.  No swelling redness or any drainage to toe sites.  No open lesions.  No other concerns today.  Last A1c was 6.0 on January 01, 2024 [place], [name], MD Last seen 01/01/2024  On Eliquis  Objective: AAO x3, NAD DP/PT pulses palpable bilaterally, CRT less than 3 seconds Nails are hypertrophic, dystrophic, brittle, discolored, elongated 10. No surrounding redness or drainage. Tenderness nails 1-5 bilaterally.  The prescription left hallux appears to have healed.  There is no edema, erythema, drainage or pus or signs of infection today. No open lesions present. No pain with calf compression, swelling, warmth, erythema  Assessment: Onychomycosis, on Eliquis  Plan: -All treatment options discussed with the patient including all alternatives, risks, complications.  -Sharply debrided the nails x 10 without any complications or bleeding.  -Daily foot inspection  Return in about 3 months (around 04/18/2024).  Charity Conch DPM

## 2024-02-16 ENCOUNTER — Other Ambulatory Visit: Payer: Self-pay | Admitting: Family Medicine

## 2024-02-16 DIAGNOSIS — I10 Essential (primary) hypertension: Secondary | ICD-10-CM

## 2024-03-24 ENCOUNTER — Other Ambulatory Visit: Payer: Self-pay

## 2024-03-24 MED ORDER — PRAVASTATIN SODIUM 40 MG PO TABS: ORAL_TABLET | ORAL | 3 refills | Status: AC

## 2024-04-23 ENCOUNTER — Telehealth: Payer: Self-pay | Admitting: Cardiology

## 2024-04-23 NOTE — Telephone Encounter (Signed)
 Pt was prescribed Prednisone  for inflammation and wants to know if ok to take. Requesting cb

## 2024-04-23 NOTE — Telephone Encounter (Signed)
Routing to Pharm D.

## 2024-04-24 ENCOUNTER — Encounter: Payer: Self-pay | Admitting: Podiatry

## 2024-04-24 ENCOUNTER — Ambulatory Visit (INDEPENDENT_AMBULATORY_CARE_PROVIDER_SITE_OTHER): Admitting: Podiatry

## 2024-04-24 DIAGNOSIS — Z7901 Long term (current) use of anticoagulants: Secondary | ICD-10-CM

## 2024-04-24 DIAGNOSIS — B351 Tinea unguium: Secondary | ICD-10-CM | POA: Diagnosis not present

## 2024-04-24 DIAGNOSIS — M79675 Pain in left toe(s): Secondary | ICD-10-CM | POA: Diagnosis not present

## 2024-04-24 DIAGNOSIS — M79674 Pain in right toe(s): Secondary | ICD-10-CM | POA: Diagnosis not present

## 2024-04-24 NOTE — Telephone Encounter (Signed)
 Patient stated she currently does not have access to Mychart and wants a call back to discuss next steps with Prednisone  medication.

## 2024-04-24 NOTE — Telephone Encounter (Signed)
 View All Conversations on this Encounter Pavero, [name], [facility] to Me (Selected Message)     04/24/24  4:55 PM Yes, please ensure pt takes prednisone  pack as directed   Returned a call back to the pt and endorsed the above recommendations per [facility].   Pt verbalized understanding and agrees with this plan.  Pt was more than gracious for all the assistance provided.

## 2024-04-24 NOTE — Progress Notes (Signed)
 Subjective: Chief Complaint  Patient presents with   [facility]    Rm11 [facility] diabetic A1c 6 Dr.Jordan Last visit march 2025     73 year old female presents the office today for concerns of thick, elongated nails that she is not able to trim herself.  No swelling redness or any drainage to toe sites. She does report some increased swelling to extremities and she has an appointment to follow-up with cardiology.  No open lesions.  No other concerns today.  Last A1c was 6.0 on January 01, 2024 [place], [name], MD Last seen 01/01/2024  On Eliquis   Objective: AAO x3, NAD DP/PT pulses palpable bilaterally, CRT less than 3 seconds Nails are hypertrophic, dystrophic, brittle, discolored, elongated 10. No surrounding redness or drainage. Tenderness nails 1-5 bilaterally.  The prescription left hallux appears to have healed.  There is no edema, erythema, drainage or pus or signs of infection today. No open lesions present. Pitting edema present bilaterally.  No erythema warmth or any open lesions.  There is no pain with calf compression.  Swelling is equal bilaterally.  Assessment: Onychomycosis, on Eliquis   Plan: -All treatment options discussed with the patient including all alternatives, risks, complications.  -Sharply debrided the nails x 10 without any complications or bleeding.  -Follow-up with cardiology or PCP in regards to leg swelling.  Encouraged elevation and continue with compression socks. -Daily foot inspection  Return in about 3 months (around 07/25/2024).   [name] DPM

## 2024-04-24 NOTE — Telephone Encounter (Signed)
 Will route this message to our PharmD team to review and advise if ok with pt to proceed in taking prednisone , with current cardiac meds/history.  Triage will follow-up with the pt accordingly thereafter.

## 2024-05-08 ENCOUNTER — Encounter: Payer: Self-pay | Admitting: *Deleted

## 2024-05-08 ENCOUNTER — Encounter: Payer: Self-pay | Admitting: Cardiology

## 2024-05-08 ENCOUNTER — Ambulatory Visit: Attending: Cardiology | Admitting: Cardiology

## 2024-05-08 ENCOUNTER — Encounter (HOSPITAL_COMMUNITY): Payer: Self-pay | Admitting: *Deleted

## 2024-05-08 VITALS — BP 142/70 | HR 69 | Resp 16 | Ht 61.0 in | Wt 251.7 lb

## 2024-05-08 DIAGNOSIS — I480 Paroxysmal atrial fibrillation: Secondary | ICD-10-CM | POA: Diagnosis not present

## 2024-05-08 DIAGNOSIS — M546 Pain in thoracic spine: Secondary | ICD-10-CM

## 2024-05-08 DIAGNOSIS — E7800 Pure hypercholesterolemia, unspecified: Secondary | ICD-10-CM

## 2024-05-08 DIAGNOSIS — G8929 Other chronic pain: Secondary | ICD-10-CM

## 2024-05-08 DIAGNOSIS — I209 Angina pectoris, unspecified: Secondary | ICD-10-CM | POA: Diagnosis not present

## 2024-05-08 DIAGNOSIS — I10 Essential (primary) hypertension: Secondary | ICD-10-CM

## 2024-05-08 NOTE — Progress Notes (Signed)
 Cardiology Office Note:  .   Date:  05/08/2024  ID:  [name], DOB 16-Nov-1950, MRN [id number] PCP: [place], [name], MD  [facility] Providers Cardiologist:  Gordy Bergamo, MD   History of Present Illness: .   [name] is a 73 y.o. female patient with  morbid obesity hypertension with stage IIIa CKD, hypercholesterolemia, paroxysmal atrial fibrillation diagnosed initially in 2021 when she presented with palpitations.  She is presently on flecainide  100 mg p.o. twice daily and Eliquis  for CHA2DS2-VASc score of 4.0.  She does have symptoms suggestive of sleep apnea but has declined sleep study.  She was previously being followed by Dr. Aleene Passe and wanted to establish with me.  She has been experiencing exertional precordial chest discomfort relieved with rest and also shortness of breath with activity.  Overall she does have a sedentary lifestyle but does notice these episodes when she goes out shopping with her sister.  Her last echocardiogram in 2021 had revealed normal LVEF and no significant valvular abnormalities.  Discussed the use of AI scribe software for clinical note transcription with the patient, who gave verbal consent to proceed.  History of Present Illness [name] is a 73 year old female with paroxysmal atrial fibrillation who presents for follow-up. She was diagnosed with paroxysmal atrial fibrillation in 2021, initially presenting with palpitations. Flecainide  has maintained her in sinus rhythm, and she is on Eliquis  for stroke prevention.  She experiences chest discomfort and wheezing with exertion, such as during shopping trips, which resolves with rest. There is no shortness of breath or chest pain at rest. She has reduced her physical activity since her AFib diagnosis, previously walking five miles weekly.  She experiences significant swelling in her legs and feet, using compression stockings. Fluid retention worsens with  salty meals. She lives alone and denies regular physical activity, managing only three minutes on a treadmill due to fatigue.  Labs   Lab Results  Component Value Date   CHOL 165 01/01/2024   HDL 50.50 01/01/2024   LDLCALC 92 01/01/2024   TRIG 115.0 01/01/2024   CHOLHDL 3 01/01/2024   No results found for: LIPOA  Lab Results  Component Value Date   NA 142 01/01/2024   K 3.8 01/01/2024   CO2 31 01/01/2024   GLUCOSE 110 (H) 01/01/2024   BUN 22 01/01/2024   CREATININE 1.26 (H) 01/01/2024   CALCIUM  9.9 01/01/2024   GFR 43.28 (L) 01/01/2024   GFRNONAA 59 (L) 04/03/2022      Latest Ref Rng & Units 01/01/2024   11:33 AM 02/27/2023    1:19 PM 04/03/2022    6:43 PM  BMP  Glucose 70 - 99 mg/dL 889  92  898   BUN 6 - 23 mg/dL 22  17  21    Creatinine 0.40 - 1.20 mg/dL 8.73  9.03  8.96   Sodium 135 - 145 mEq/L 142  141  141   Potassium 3.5 - 5.1 mEq/L 3.8  3.6  4.1   Chloride 96 - 112 mEq/L 102  102  100   CO2 19 - 32 mEq/L 31  29  34   Calcium  8.4 - 10.5 mg/dL 9.9  89.9  89.8       Latest Ref Rng & Units 02/27/2023    1:19 PM 04/03/2022    6:43 PM 12/10/2020   10:20 AM  CBC  WBC 4.0 - 10.5 K/uL 6.0  12.8  5.2   Hemoglobin 12.0 - 15.0  g/dL 87.6  86.0  86.4   Hematocrit 36.0 - 46.0 % 38.3  42.6  40.8   Platelets 150.0 - 400.0 K/uL 271.0  257  249.0    Lab Results  Component Value Date   HGBA1C 6.0 01/01/2024    Lab Results  Component Value Date   TSH 2.33 02/27/2023    ROS  Review of Systems  Cardiovascular:  Positive for chest pain and dyspnea on exertion. Negative for leg swelling.   Physical Exam:   VS:  BP (!) 142/70 (BP Location: Left Arm, Patient Position: Sitting, Cuff Size: Normal)   Pulse 69   Resp 16   Ht 5' 1 (1.549 m)   Wt 251 lb 11.2 oz (114.2 kg)   SpO2 95%   BMI 47.56 kg/m    Wt Readings from Last 3 Encounters:  05/08/24 251 lb 11.2 oz (114.2 kg)  01/01/24 250 lb 12.8 oz (113.8 kg)  11/14/23 247 lb 3.2 oz (112.1 kg)    Physical  Exam Constitutional:      Appearance: She is morbidly obese.  Neck:     Vascular: No carotid bruit or JVD.  Cardiovascular:     Rate and Rhythm: Normal rate and regular rhythm.     Pulses: Intact distal pulses.     Heart sounds: Normal heart sounds. No murmur heard.    No gallop.  Pulmonary:     Effort: Pulmonary effort is normal.     Breath sounds: Normal breath sounds.  Abdominal:     General: Bowel sounds are normal.     Palpations: Abdomen is soft.  Musculoskeletal:     Right lower leg: No edema.     Left lower leg: No edema.    Studies Reviewed: SABRA     EKG:    EKG Interpretation Date/Time:  Thursday May 08 2024 11:12:49 EDT Ventricular Rate:  59 PR Interval:  218 QRS Duration:  114 QT Interval:  466 QTC Calculation: 461 R Axis:   -26  Text Interpretation: EKG 05/08/2024: Sinus bradycardia with sinus arrhythmia and first-degree AV block at rate of 59 bpm, normal axis, low-voltage complexes.  Compared to 11/14/2023, no significant change. Confirmed by Lorelai Huyser, Jagadeesh ([id number]) on 05/08/2024 11:44:21 AM    Medications ordered    No orders of the defined types were placed in this encounter.    ASSESSMENT AND PLAN: .      ICD-10-CM   1. Paroxysmal atrial fibrillation (HCC)  I48.0 EKG 12-Lead    Informed Consent Details: Physician/Practitioner Attestation; Transcribe to consent form and obtain patient signature    ECHOCARDIOGRAM COMPLETE    MYOCARDIAL PERFUSION IMAGING    2. Angina pectoris (HCC)  I20.9 Informed Consent Details: Physician/Practitioner Attestation; Transcribe to consent form and obtain patient signature    ECHOCARDIOGRAM COMPLETE    MYOCARDIAL PERFUSION IMAGING    3. Essential hypertension  I10     4. Chronic bilateral thoracic back pain  M54.6    G89.29     5. Pure hypercholesterolemia  E78.00       Assessment and Plan Assessment & Plan  Chest pain with exertional activity associated shortness of breath Symptoms suggestive angina pectoris.   Will perform walking treadmill Lexiscan nuclear stress test.  Office visit after the test.  She is on appropriate medical therapy.  Paroxysmal atrial fibrillation on antiarrhythmic and anticoagulation therapy Paroxysmal atrial fibrillation diagnosed in 2021 with an AFib burden of 11%. Currently well-controlled on flecainide  and Eliquis , maintaining sinus rhythm. - Continue flecainide   for rate control. - Continue Eliquis  for stroke prevention. - Order walking Lexiscan nuclear stress test to evaluate for cardiac ischemia. - Order echocardiogram to assess cardiac function.  Hypertension with associated chronic kidney disease Hypertension contributing to mild chronic kidney disease. Fluid retention noted, with significant lower extremity edema. Discussed the impact of obesity and dietary salt intake on fluid retention and kidney function. Emphasized the need to balance fluid management with kidney health. - Advise on dietary modifications to reduce salt intake.  Obesity with lower extremity edema Obesity contributing to lower extremity edema. Discussed the role of weight loss in reducing venous pressure and fluid retention. Emphasized the importance of lifestyle changes over medication adjustments. Discussed dietary changes to reduce caloric intake and manage edema. - Encourage weight loss through dietary changes, including reducing portion sizes and avoiding high-calorie foods such as nuts, cheese, and desserts.  Chronic insomnia Chronic insomnia with difficulty initiating and maintaining sleep. Likely exacerbated by previous caregiving responsibilities and current lifestyle habits. Discussed sleep hygiene and potential use of over-the-counter sleep aids. Emphasized the importance of consistent sleep patterns and avoiding stimulants before bed. - Recommend over-the-counter sleep aids such as Benadryl or melatonin. - Advise on sleep hygiene practices, including avoiding naps, reducing screen time  before bed, and creating a dark, quiet sleep environment.   Signed,  Gordy Bergamo, MD, [facility] 05/08/2024, 8:47 PM [facility] [address] [place], [place] [address] Phone: [phone]. Fax:  [phone]

## 2024-05-08 NOTE — Patient Instructions (Addendum)
 Medication Instructions:  Your physician recommends that you continue on your current medications as directed. Please refer to the Current Medication list given to you today.  *If you need a refill on your cardiac medications before your next appointment, please call your pharmacy*  Lab Work: none If you have labs (blood work) drawn today and your tests are completely normal, you will receive your results only by: MyChart Message (if you have MyChart) OR A paper copy in the mail If you have any lab test that is abnormal or we need to change your treatment, we will call you to review the results.  Testing/Procedures: Your physician has requested that you have a lexiscan myoview. For further information please visit [email or web address] Please follow instruction sheet, as given.  Your physician has requested that you have an echocardiogram. Echocardiography is a painless test that uses sound waves to create images of your heart. It provides your doctor with information about the size and shape of your heart and how well your heart's chambers and valves are working. This procedure takes approximately one hour. There are no restrictions for this procedure. Please do NOT wear cologne, perfume, aftershave, or lotions (deodorant is allowed). Please arrive 15 minutes prior to your appointment time.  Please note: We ask at that you not bring children with you during ultrasound (echo/ vascular) testing. Due to room size and safety concerns, children are not allowed in the ultrasound rooms during exams. Our front office staff cannot provide observation of children in our lobby area while testing is being conducted. An adult accompanying a patient to their appointment will only be allowed in the ultrasound room at the discretion of the ultrasound technician under special circumstances. We apologize for any inconvenience.   Follow-Up: At [facility], you and your health needs are our priority.   As part of our continuing mission to provide you with exceptional heart care, our providers are all part of one team.  This team includes your primary Cardiologist (physician) and Advanced Practice Providers or APPs (Physician Assistants and Nurse Practitioners) who all work together to provide you with the care you need, when you need it.  Your next appointment:   6 - 8 week(s)  Provider:   Gordy Bergamo, MD    We recommend signing up for the patient portal called MyChart.  Sign up information is provided on this After Visit Summary.  MyChart is used to connect with patients for Virtual Visits (Telemedicine).  Patients are able to view lab/test results, encounter notes, upcoming appointments, etc.  Non-urgent messages can be sent to your provider as well.   To learn more about what you can do with MyChart, go to [email or web address].   Other Instructions  You are scheduled for a Myocardial Perfusion Imaging Study on:   at .  Please arrive 15 minutes prior to your appointment time for registration and insurance purposes.  The test will take approximately 3 to 4 hours to complete; you may bring reading material.  If someone comes with you to your appointment, they will need to remain in the main lobby due to limited space in the testing area. **If you are pregnant or breastfeeding, please notify the nuclear lab prior to your appointment**  How to prepare for your Myocardial Perfusion Test: Do not eat or drink 3 hours prior to your test, except you may have water. Do not consume products containing caffeine (regular or decaffeinated) 12 hours prior to your test. (ex: coffee, chocolate, sodas, tea). Do  bring a list of your current medications with you.  If not listed below, you may take your medications as normal. Do not take atenolol  (Tenormin ) for 24 hours prior to  the test.  Bring the medication to your appointment as you may be required to take it once the test is complete. Do wear  comfortable clothes (no dresses or overalls) and walking shoes, tennis shoes preferred (No heels or open toe shoes are allowed). Do NOT wear cologne, perfume, aftershave, or lotions (deodorant is allowed). If these instructions are not followed, your test will have to be rescheduled.  Please report to [address] (The [facility]), 2nd Floor, for your test.  If you have questions or concerns about your appointment, you can call the Nuclear Lab at [phone].  If you cannot keep your appointment, please provide 24 hours notification to the Nuclear Lab, to avoid a possible $50 charge to your account.

## 2024-05-14 ENCOUNTER — Ambulatory Visit (HOSPITAL_COMMUNITY): Payer: Medicare PPO | Admitting: Physician Assistant

## 2024-05-16 ENCOUNTER — Ambulatory Visit (HOSPITAL_COMMUNITY)
Admission: RE | Admit: 2024-05-16 | Discharge: 2024-05-16 | Disposition: A | Discharge status: Home / Self Care | Source: Ambulatory Visit | Attending: Physician Assistant | Admitting: Physician Assistant

## 2024-05-16 ENCOUNTER — Encounter (HOSPITAL_COMMUNITY): Payer: Self-pay | Admitting: Physician Assistant

## 2024-05-16 VITALS — BP 128/74 | HR 64 | Ht 61.0 in | Wt 252.4 lb

## 2024-05-16 DIAGNOSIS — D6869 Other thrombophilia: Secondary | ICD-10-CM

## 2024-05-16 DIAGNOSIS — Z79899 Other long term (current) drug therapy: Secondary | ICD-10-CM | POA: Diagnosis not present

## 2024-05-16 DIAGNOSIS — Z5181 Encounter for therapeutic drug level monitoring: Secondary | ICD-10-CM

## 2024-05-16 DIAGNOSIS — I480 Paroxysmal atrial fibrillation: Secondary | ICD-10-CM | POA: Diagnosis not present

## 2024-05-16 NOTE — Progress Notes (Signed)
 Primary Care Physician: [place], [name], MD Primary Cardiologist: Dr Ladona Primary Electrophysiologist: none Referring Physician: Dr Alveta Quiver [name] is a 73 y.o. female with a history of HTN, HLD, and atrial fibrillation who presents for follow up in the [facility].  The patient was initially diagnosed with atrial fibrillation 04/2020 after presenting to the her PCP office with palpitations. She also wore a 30 day event monitor which showed a 7% afib burden. Patient is on Eliquis  for stroke prevention. She admits to snoring and daytime somnolence but has declined sleep study.   Patient returns for follow up for atrial fibrillation and flecainide  monitoring. She reports that she has done well since her last visit. She remains in SR. She denies any interim symptoms of afib. No recent bleeding issues on anticoagulation.   Today, she  denies symptoms of palpitations, chest pain, shortness of breath, orthopnea, PND, lower extremity edema, dizziness, presyncope, syncope, bleeding, or neurologic sequela. The patient is tolerating medications without difficulties and is otherwise without complaint today.    Atrial Fibrillation Risk Factors:  she does have symptoms or diagnosis of sleep apnea. She declines sleep study. she does not have a history of rheumatic fever. she does not have a history of alcohol use.   Atrial Fibrillation Management history:  Previous antiarrhythmic drugs: flecainide   Previous cardioversions: none Previous ablations: none Anticoagulation history: Eliquis    Past Medical History:  Diagnosis Date   Abdominal pain, epigastric 12/28/2008   Centricity Description: ABDOMINAL PAIN, EPIGASTRIC Qualifier: Diagnosis of  By: Earlean CMA LEODIS), [name]   Centricity Description: ABDOMINAL PAIN-EPIGASTRIC Qualifier: Diagnosis of  By: Ever Riggers, [name]    Allergic rhinitis 09/16/2013   Allergy    Anemia    Anxiety    Anxiety  disorder, unspecified 07/06/2017   Arthritis    Blood in stool    BMI 45.0-49.9, adult (HCC) 07/26/2016   Class 3 obesity with serious comorbidity and body mass index (BMI) of 45.0 to 49.9 in adult 03/07/2017   DDD (degenerative disc disease), lumbar    Diabetes mellitus without complication (HCC)    DIVERTICULOSIS-COLON 12/28/2008   Qualifier: Diagnosis of  By: Ever PA-c, [name]    GERD 12/28/2008   Qualifier: Diagnosis of  By: Ever PA-c, [name]    GERD (gastroesophageal reflux disease)    Headache    Hyperglycemia 03/17/2014   Hypertension    Hypokalemia 07/26/2016   IBS (irritable bowel syndrome)-C-D 07/26/2016   Obesity    Other and unspecified hyperlipidemia 03/17/2014   PERSONAL HX COLONIC POLYPS 12/28/2008   Qualifier: Diagnosis of  By: Ever PA-c, [name]    Reflux esophagitis 12/28/2008   Qualifier: Diagnosis of  By: Earlean CMA (AAMA), [name]     S/P lumbar spinal fusion 07/02/2012   Tubulovillous adenoma of colon 2008    ROS- All systems are reviewed and negative except as per the HPI above.  Physical Exam: Vitals:   05/16/24 0856  BP: 128/74  Pulse: 64  Weight: 114.5 kg  Height: 5' 1 (1.549 m)     GEN: Well nourished, well developed in no acute distress CARDIAC: Regular rate and rhythm, no murmurs, rubs, gallops RESPIRATORY:  Clear to auscultation without rales, wheezing or rhonchi  ABDOMEN: Soft, non-tender, non-distended EXTREMITIES:  No edema; No deformity    Wt Readings from Last 3 Encounters:  05/16/24 114.5 kg  05/08/24 114.2 kg  01/01/24 113.8 kg    EKG today demonstrates  SR, NST Vent. rate 64 BPM PR interval 196 ms QRS duration 104 ms QT/QTcB 338/348 ms   Echo 05/11/20 demonstrated   1. Left ventricular ejection fraction, by estimation, is 60 to 65%. The left ventricle has normal function. The left ventricle has no regional wall motion abnormalities. Left ventricular diastolic parameters were normal.   2. Right ventricular  systolic function is normal. The right ventricular  size is normal. There is normal pulmonary artery systolic pressure.   3. The mitral valve is normal in structure. Trivial mitral valve  regurgitation. No evidence of mitral stenosis.   4. The aortic valve is tricuspid. Aortic valve regurgitation is not  visualized. Mild aortic valve sclerosis is present, with no evidence of aortic valve stenosis.   5. The inferior vena cava is normal in size with greater than 50%  respiratory variability, suggesting right atrial pressure of 3 mmHg.   Epic records are reviewed at length today  CHA2DS2-VASc Score = 4  The patient's score is based upon: CHF History: 0 HTN History: 1 Diabetes History: 1 Stroke History: 0 Vascular Disease History: 0 Age Score: 1 Gender Score: 1       ASSESSMENT AND PLAN: Paroxysmal Atrial Fibrillation/atypical atrial flutter (ICD10:  I48.0) The patient's CHA2DS2-VASc score is 4, indicating a 4.8% annual risk of stroke.   Patient appears to be maintaining SR Continue flecainide  100 mg BID Continue Eliquis  5 mg BID Continue atenolol  12.5 mg daily  Secondary Hypercoagulable State (ICD10:  D68.69) The patient is at significant risk for stroke/thromboembolism based upon her CHA2DS2-VASc Score of 4.  Continue Apixaban  (Eliquis ). No bleeding issues.   High Risk Medication Monitoring (ICD 10: Z79.899) Intervals on ECG acceptable for flecainide  monitoring.     Obesity Body mass index is 47.69 kg/m.  Encouraged lifestyle modification  HTN Stable on current regimen   Follow up in the AF clinic in 6 months.    Daril Kicks PA-C [facility] [address] [place], [place] [address] [phone] 05/16/2024 9:08 AM

## 2024-05-19 ENCOUNTER — Other Ambulatory Visit: Payer: Self-pay | Admitting: Cardiology

## 2024-05-19 DIAGNOSIS — I209 Angina pectoris, unspecified: Secondary | ICD-10-CM

## 2024-05-19 DIAGNOSIS — I480 Paroxysmal atrial fibrillation: Secondary | ICD-10-CM

## 2024-05-22 ENCOUNTER — Ambulatory Visit (HOSPITAL_COMMUNITY)
Admission: RE | Admit: 2024-05-22 | Discharge: 2024-05-22 | Disposition: A | Discharge status: Home / Self Care | Source: Ambulatory Visit | Attending: Cardiology | Admitting: Cardiology

## 2024-05-22 DIAGNOSIS — I480 Paroxysmal atrial fibrillation: Secondary | ICD-10-CM | POA: Insufficient documentation

## 2024-05-22 DIAGNOSIS — I209 Angina pectoris, unspecified: Secondary | ICD-10-CM | POA: Diagnosis not present

## 2024-05-22 LAB — MYOCARDIAL PERFUSION IMAGING
LV dias vol: 32 mL (ref 46–106)
LV sys vol: 104 mL (ref 3.8–5.2)
Nuc Stress EF: 69 %
Peak HR: 75 {beats}/min
Rest HR: 60 {beats}/min
Rest Nuclear Isotope Dose: 12.9 mCi
SDS: 4
SRS: 1
SSS: 5
ST Depression (mm): 0 mm
Stress Nuclear Isotope Dose: 37.9 mCi
TID: 1

## 2024-05-22 MED ORDER — TECHNETIUM TC 99M TETROFOSMIN IV KIT
12.9000 | PACK | Freq: Once | INTRAVENOUS | Status: AC | PRN
  Administered 2024-05-22: 12.9 via INTRAVENOUS

## 2024-05-22 MED ORDER — REGADENOSON 0.4 MG/5ML IV SOLN
0.4000 mg | Freq: Once | INTRAVENOUS | Status: AC
  Administered 2024-05-22: 0.4 mg via INTRAVENOUS

## 2024-05-22 MED ORDER — TECHNETIUM TC 99M TETROFOSMIN IV KIT
37.9000 | PACK | Freq: Once | INTRAVENOUS | Status: AC | PRN
  Administered 2024-05-22: 37.9 via INTRAVENOUS

## 2024-05-22 MED ORDER — REGADENOSON 0.4 MG/5ML IV SOLN
INTRAVENOUS | Status: AC
  Filled 2024-05-22: qty 5

## 2024-05-23 ENCOUNTER — Ambulatory Visit: Payer: Self-pay | Admitting: Cardiology

## 2024-05-23 NOTE — Progress Notes (Signed)
Normal stress test. Low risk

## 2024-05-26 ENCOUNTER — Ambulatory Visit: Payer: Self-pay | Admitting: Cardiology

## 2024-05-26 NOTE — Progress Notes (Signed)
 Extracardiac finding from SPECT CT interpretation 05/26/2024: Patulous esophagus and aortic atherosclerosis.  Nothing significant simple cyst in the liver.

## 2024-06-17 ENCOUNTER — Ambulatory Visit (HOSPITAL_COMMUNITY)
Admission: RE | Admit: 2024-06-17 | Discharge: 2024-06-17 | Disposition: A | Discharge status: Home / Self Care | Source: Ambulatory Visit | Attending: Internal Medicine | Admitting: Internal Medicine

## 2024-06-17 DIAGNOSIS — I480 Paroxysmal atrial fibrillation: Secondary | ICD-10-CM | POA: Insufficient documentation

## 2024-06-17 DIAGNOSIS — I209 Angina pectoris, unspecified: Secondary | ICD-10-CM | POA: Insufficient documentation

## 2024-06-17 LAB — ECHOCARDIOGRAM COMPLETE
Area-P 1/2: 4.8 cm2
S' Lateral: 3.4 cm

## 2024-06-17 NOTE — Progress Notes (Signed)
 Normal LV systolic function. Minor other abnormality.

## 2024-06-18 ENCOUNTER — Other Ambulatory Visit (HOSPITAL_COMMUNITY): Payer: Self-pay | Admitting: Physician Assistant

## 2024-06-24 ENCOUNTER — Other Ambulatory Visit: Payer: Self-pay | Admitting: Family Medicine

## 2024-07-04 ENCOUNTER — Ambulatory Visit: Admitting: Family Medicine

## 2024-07-04 ENCOUNTER — Encounter: Payer: Self-pay | Admitting: Family Medicine

## 2024-07-04 VITALS — BP 138/70 | HR 60 | Temp 97.9°F | Resp 16 | Ht 61.0 in | Wt 252.0 lb

## 2024-07-04 DIAGNOSIS — J309 Allergic rhinitis, unspecified: Secondary | ICD-10-CM

## 2024-07-04 DIAGNOSIS — R1013 Epigastric pain: Secondary | ICD-10-CM

## 2024-07-04 DIAGNOSIS — E1169 Type 2 diabetes mellitus with other specified complication: Secondary | ICD-10-CM

## 2024-07-04 DIAGNOSIS — L309 Dermatitis, unspecified: Secondary | ICD-10-CM | POA: Diagnosis not present

## 2024-07-04 DIAGNOSIS — K219 Gastro-esophageal reflux disease without esophagitis: Secondary | ICD-10-CM

## 2024-07-04 DIAGNOSIS — I10 Essential (primary) hypertension: Secondary | ICD-10-CM | POA: Diagnosis not present

## 2024-07-04 MED ORDER — HYDROCORTISONE 2.5 % EX OINT: TOPICAL_OINTMENT | Freq: Every day | CUTANEOUS | 2 refills | Status: AC | PRN

## 2024-07-04 MED ORDER — FLUTICASONE PROPIONATE 50 MCG/ACT NA SUSP: NASAL | 0 refills | Status: AC

## 2024-07-04 NOTE — Assessment & Plan Note (Signed)
 Comorbidities: Hyperlipidemia, hypertension, BMI 47, GERD, and OA. Currently she is on nonpharmacologic treatment. Problem has been adequately controlled. Lab service is not available today, she is coming back Monday for blood work. Further recommendation will be given according to hemoglobin A1c result. Continue appropriate footcare and periodic visits with dentist and eye care provider. Follow-up in 6 months.

## 2024-07-04 NOTE — Assessment & Plan Note (Signed)
 Problem is not well-controlled, currently she is on omeprazole  40 mg twice daily.  She prefers not to change to a different PPI at this she sees gastroenterologist. Continue GERD precautions.

## 2024-07-04 NOTE — Assessment & Plan Note (Signed)
 Problem is stable and adequately controlled with Flonase  nasal spray. No changes in current management today.

## 2024-07-04 NOTE — Patient Instructions (Addendum)
 A few things to remember from today's visit:  Type 2 diabetes mellitus with other specified complication, without long-term current use of insulin (HCC) - Plan: Hemoglobin A1c  Essential hypertension - Plan: Basic metabolic panel with GFR  Gastroesophageal reflux disease, unspecified whether esophagitis present - Plan: hydrocortisone  2.5 % ointment, Ambulatory referral to Gastroenterology  Eczema, unspecified type  Allergic rhinitis, unspecified seasonality, unspecified trigger  No changes today.  If you need refills for medications you take chronically, please call your pharmacy. Do not use My Chart to request refills or for acute issues that need immediate attention. If you send a my chart message, it may take a few days to be addressed, specially if I am not in the office.  Please be sure medication list is accurate. If a new problem present, please set up appointment sooner than planned today.

## 2024-07-04 NOTE — Assessment & Plan Note (Signed)
 In general BP adequately controlled. Continue amlodipine  5 mg daily and HCTZ 25 mg daily as well as low-salt diet. Monitor BP at home. Eye exam is current.

## 2024-07-04 NOTE — Assessment & Plan Note (Signed)
 History reported by patient today. She has been using hydrocortisone  ointment 2.5% for a while, which helps. We discussed some side effects of topical steroids.

## 2024-07-04 NOTE — Progress Notes (Signed)
 Chief Complaint  Patient presents with   Medical Management of Chronic Issues    Six month follow-up    Discussed the use of AI scribe software for clinical note transcription with the patient, who gave verbal consent to proceed.  History of Present Illness [name] is a 73 year old female with a PMHx significant for HTN, HLD, DM II, atrial fibrillation on chronic anticoagulation, IBS, and chronic pain here today for a follow-up visit.  Last seen on 01/01/24.  Diabetes Mellitus II: Dx'ed in 2018.   Not currently on pharmacologic treatment.   Negative for symptoms of hypoglycemia, polyuria, polydipsia, foot ulcers/trauma. Saddle anesthesia and LE numbness after epidural injection months ago, stable.  She monitors her blood sugar at home, noting fluctuations with a recent high of 165 mg/dL after consuming a banana split. She occasionally consumes sweets.  Lab Results  Component Value Date   HGBA1C 6.0 01/01/2024   Lab Results  Component Value Date   MICROALBUR <0.7 01/01/2024   MICROALBUR 1.6 08/11/2020   Hypertension: Currently she is on amlodipine  5 mg daily and HCTZ 25 mg daily. Atrial fibrillation on flecainide  in 100 mg twice daily and atenolol  12.5 mg daily. She is on chronic anticoagulation,Eliquis  5 mg bid.  States that her blood pressure was going up and her cardiologist told her to take a little extra amlodipine .  Negative for unusual or severe headache, visual changes, exertional chest pain, dyspnea,  focal weakness, or worsening edema.  She experiences swelling in her feet and ankles, managed with compression stockings, which she wears regularly.  HypoK+ on KLOR 20 meq daily. Lab Results  Component Value Date   NA 142 01/01/2024   CL 102 01/01/2024   K 3.8 01/01/2024   CO2 31 01/01/2024   BUN 22 01/01/2024   CREATININE 1.26 (H) 01/01/2024   GFR 43.28 (L) 01/01/2024   CALCIUM  9.9 01/01/2024   PHOS 3.1 04/24/2012   ALBUMIN 4.1 01/01/2024    GLUCOSE 110 (H) 01/01/2024   Her e GFR has been abnormal intermittently, no known hx of CKD.  Requesting refill on hydrocortisone  2.5%, she has a history of eczema, using hydrocortisone  ointment on her forearms and occasionally areas of face , mixed with Vaseline.  She is currently asymptomatic.  -She experiences persistent epigastric pain, described as a 'sore' feeling, worsening with pressure. Pain started a few months ago. She takes omeprazole  40 mg twice daily for acid reflux but continues to have symptoms. Hydrocodone -acetaminophen  for back pain is believed to exacerbate her stomach issues. She has not identified alleviating factors. No changes in bowel habits. She would like a referral to gastroenterologist. History of chronic hemorrhoids with intermittent bleeding, her gastroenterologist has retired. Last colonoscopy 10/2018.  -Chronic back pain with radiculopathy and unstable gait. She has had a few falls with the most recent in July in her kitchen. Did not seek immediate medical attention, no serious injury. She uses a walker at home.  Allergy rhinitis: Currently she is on Flonase  nasal spray daily as needed.  Review of Systems  Constitutional:  Negative for activity change, appetite change and fever.  HENT:  Negative for sore throat and trouble swallowing.   Respiratory:  Negative for cough and wheezing.   Gastrointestinal:  Negative for nausea and vomiting.  Genitourinary:  Negative for decreased urine volume, dysuria and hematuria.  Musculoskeletal:  Positive for arthralgias, back pain and gait problem.  Skin:  Negative for rash.  Neurological:  Negative for syncope and facial  asymmetry.  See other pertinent positives and negatives in HPI.  Current Outpatient Medications on File Prior to Visit  Medication Sig Dispense Refill   Accu-Chek Softclix Lancets lancets Use to check blood sugars 1-2 times daily. 100 each 12   amLODipine  (NORVASC ) 5 MG tablet Take 1 tablet by  mouth once daily 90 tablet 2   apixaban  (ELIQUIS ) 5 MG TABS tablet Take 1 tablet by mouth twice daily 180 tablet 2   Aspirin-Salicylamide-Caffeine (BC FAST PAIN RELIEF) 650-195-33.3 MG PACK Take 1 tablet by mouth as needed.     atenolol  (TENORMIN ) 25 MG tablet TAKE 1/2 (ONE-HALF) TABLET BY MOUTH ONCE DAILY MAY  TAKE  EXTRA  ONE-HALF  FOR  BREAKTHROUGH  AFIB 90 tablet 2   azelastine  (ASTELIN ) 0.1 % nasal spray Place 2 sprays into both nostrils 2 (two) times daily. Use in each nostril as directed (Patient taking differently: Place 2 sprays into both nostrils as needed. Use in each nostril as directed) 30 mL 6   Blood Glucose Monitoring Suppl (ACCU-CHEK GUIDE ME) w/Device KIT USE TO CHECK GLUCOSE ONCE DAILY TO TWICE DAILY 1 kit 0   calcium  carbonate (TUMS EX) 750 MG chewable tablet Chew 2 tablets by mouth daily as needed for heartburn.     Cetirizine HCl (ZYRTEC ALLERGY PO) Take 10 mg by mouth as needed.     ciclopirox  (PENLAC ) 8 % solution Apply topically at bedtime. Apply over nail and surrounding skin. Apply daily over previous coat. After seven (7) days, may remove with alcohol and continue cycle. 6.6 mL 2   cyclobenzaprine (FLEXERIL) 5 MG tablet Take 2.5 mg by mouth daily as needed (pain level 8 or greater).     Docusate Sodium (STOOL SOFTENER LAXATIVE PO) Take by mouth as needed.     flecainide  (TAMBOCOR ) 100 MG tablet Take 1 tablet by mouth twice daily 180 tablet 2   glucose blood (ACCU-CHEK GUIDE) test strip USE 1 STRIP ONCE TO  TWICE DAILY TO TEST BLOOD SUGARS 100 each 2   hydrochlorothiazide  (HYDRODIURIL ) 25 MG tablet Take 1 tablet by mouth once daily 90 tablet 2   HYDROcodone -acetaminophen  (NORCO/VICODIN) 5-325 MG tablet Take 0.5 tablets by mouth daily as needed (pain level 8 or greater). 30 tablet 0   hydrOXYzine  (ATARAX ) 25 MG tablet TAKE 1 TABLET BY MOUTH THREE TIMES DAILY AS NEEDED FOR ANXIETY 90 tablet 0   lidocaine  4 % Place 1 patch onto the skin daily.     Menthol-Zinc Oxide  (CALMOSEPTINE) 0.44-20.6 % OINT Apply topically as needed.     Metamucil Fiber CHEW Chew by mouth as needed.     Multiple Vitamins-Minerals (MULTI-VITAMIN GUMMIES PO) Take by mouth as needed.     omeprazole  (PRILOSEC) 40 MG capsule Take 40 mg by mouth 2 (two) times daily.     Polyethylene Glycol 3350  (MIRALAX PO) Take by mouth as needed.     potassium chloride  (KLOR-CON ) 10 MEQ tablet Take 2 tablets by mouth once daily 180 tablet 1   pravastatin  (PRAVACHOL ) 40 MG tablet Take 1 tablet by mouth twice a week 27 tablet 3   Simethicone (GAS-X PO) Take by mouth as needed.     trolamine salicylate (ASPERCREME) 10 % cream Apply 1 application topically as needed for muscle pain.     No current facility-administered medications on file prior to visit.    Past Medical History:  Diagnosis Date   Abdominal pain, epigastric 12/28/2008   Centricity Description: ABDOMINAL PAIN, EPIGASTRIC Qualifier: Diagnosis of  By: Lewellyn  CMA (AAMA), [name]   Centricity Description: ABDOMINAL PAIN-EPIGASTRIC Qualifier: Diagnosis of  By: Ever Riggers, [name]    Allergic rhinitis 09/16/2013   Allergy    Anemia    Anxiety    Anxiety disorder, unspecified 07/06/2017   Arthritis    Blood in stool    BMI 45.0-49.9, adult (HCC) 07/26/2016   Class 3 obesity with serious comorbidity and body mass index (BMI) of 45.0 to 49.9 in adult 03/07/2017   DDD (degenerative disc disease), lumbar    Diabetes mellitus without complication (HCC)    DIVERTICULOSIS-COLON 12/28/2008   Qualifier: Diagnosis of  By: Ever PA-c, [name]    GERD 12/28/2008   Qualifier: Diagnosis of  By: Ever PA-c, [name]    GERD (gastroesophageal reflux disease)    Headache    Hyperglycemia 03/17/2014   Hypertension    Hypokalemia 07/26/2016   IBS (irritable bowel syndrome)-C-D 07/26/2016   Obesity    Other and unspecified hyperlipidemia 03/17/2014   PERSONAL HX COLONIC POLYPS 12/28/2008   Qualifier: Diagnosis of  By: Ever PA-c, [name]     Reflux esophagitis 12/28/2008   Qualifier: Diagnosis of  By: Lewellyn CMA (AAMA), [name]     S/P lumbar spinal fusion 07/02/2012   Tubulovillous adenoma of colon 13-Aug-2007    Allergies  Allergen Reactions   Crestor  [Rosuvastatin ]    Erythromycin     rash   Jublia  [Efinaconazole ] Hives    Redness/burning around nail   Lansoprazole    Penicillins     rash   Potassium-Containing Compounds Other (See Comments)    Stomach upset, can take pills, but not the packets    Robaxin [Methocarbamol]     Messed up stomach   Tea Tree Oil     Redness, burning (localized)   Trental [Pentoxifylline Er]     Bad headache   Sulfonamide Derivatives Rash    rash    Social History   Socioeconomic History   Marital status: Widowed    Spouse name: Not on file   Number of children: Not on file   Years of education: Not on file   Highest education level: Some college, no degree  Occupational History   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  Tobacco Use   Smoking status: Former    Current packs/day: 0.00    Average packs/day: 1 pack/day for 38.0 years (38.0 ttl pk-yrs)    Types: Cigarettes    Start date: 09/08/1960    Quit date: 09/08/1998    Years since quitting: 25.8   Smokeless tobacco: Never   Tobacco comments:    Former smoker 06/06/21  Vaping Use   Vaping status: Never Used  Substance and Sexual Activity   Alcohol use: No   Drug use: No   Sexual activity: Not Currently    Comment: Husband passed away in 1999-08-13  Other Topics Concern   Not on file  Social History Narrative   Widow, 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   Social Drivers of Corporate investment banker Strain: Low Risk  (02/19/2024)   Received from [facility]   Overall Financial Resource Strain (CARDIA)    Difficulty of Paying Living Expenses: Not hard at all  Food Insecurity: No Food Insecurity (02/19/2024)   Received from [facility]   Hunger Vital Sign    Within the past 12 months, you worried that your food  would run out before you got the money to buy more.: Never true    Within the past  12 months, the food you bought just didn't last and you didn't have money to get more.: Never true  Transportation Needs: No Transportation Needs (02/19/2024)   Received [facility] - [facility]    Lack of Transportation (Medical): No    Lack of Transportation (Non-Medical): No  Physical Activity: Unknown (12/28/2023)   Exercise Vital Sign    Days of Exercise per Week: 0 days    Minutes of Exercise per Session: Not on file  Stress: No Stress Concern Present (12/28/2023)   [employer] of Occupational Health - Occupational Stress Questionnaire    Feeling of Stress : Only a little  Social Connections: Moderately Integrated (12/28/2023)   Social Connection and Isolation Panel    Frequency of Communication with Friends and Family: More than three times a week    Frequency of Social Gatherings with Friends and Family: Once a week    Attends Religious Services: More than 4 times per year    Active Member of [employer] or Organizations: Yes    Attends Banker Meetings: More than 4 times per year    Marital Status: Widowed    Today's Vitals   07/04/24 1118  BP: 138/70  Pulse: 60  Resp: 16  Temp: 97.9 F (36.6 C)  SpO2: 98%  Weight: 252 lb (114.3 kg)  Height: 5' 1 (1.549 m)   Body mass index is 47.61 kg/m.  Physical Exam Vitals and nursing note reviewed.  Constitutional:      General: She is not in acute distress.    Appearance: She is well-developed.  HENT:     Head: Normocephalic and atraumatic.     Mouth/Throat:     Mouth: Mucous membranes are moist.  Eyes:     Conjunctiva/sclera: Conjunctivae normal.  Cardiovascular:     Rate and Rhythm: Normal rate and regular rhythm.     Pulses:          Dorsalis pedis pulses are 2+ on the right side and 2+ on the left side.     Heart sounds: No murmur heard. Pulmonary:     Effort: Pulmonary effort is normal. No  respiratory distress.     Breath sounds: Normal breath sounds.  Abdominal:     Palpations: Abdomen is soft. There is no mass.     Tenderness: There is abdominal tenderness in the epigastric area. There is no right CVA tenderness, left CVA tenderness, guarding or rebound.     Hernia: A hernia is present. Hernia is present in the ventral area (?).  Musculoskeletal:     Right lower leg: 1+ Pitting Edema present.     Left lower leg: 1+ Pitting Edema present.  Skin:    General: Skin is warm.     Findings: No erythema or rash.  Neurological:     Mental Status: She is alert and oriented to person, place, and time.     Cranial Nerves: No cranial nerve deficit.     Comments: Antalgic, unstable gait assisted with a cane.  Psychiatric:        Mood and Affect: Mood and affect normal.   ASSESSMENT AND PLAN:  [name] was seen today for medical management of chronic issues.  Diagnoses and all orders for this visit:  Orders Placed This Encounter  Procedures   Basic metabolic panel with GFR   Hemoglobin A1c   Ambulatory referral to Gastroenterology   Type 2 diabetes mellitus with other specified complication, without long-term current use of insulin (  HCC) Assessment & Plan: Comorbidities: Hyperlipidemia, hypertension, BMI 47, GERD, and OA. Currently she is on nonpharmacologic treatment. Problem has been adequately controlled. Lab service is not available today, she is coming back Monday for blood work. Further recommendation will be given according to hemoglobin A1c result. Continue appropriate footcare and periodic visits with dentist and eye care provider. Follow-up in 6 months.  Orders: -     Hemoglobin A1c; Future  Gastroesophageal reflux disease, unspecified whether esophagitis present Assessment & Plan: Problem is not well-controlled, currently she is on omeprazole  40 mg twice daily.  She prefers not to change to a different PPI at this time. Would like to discuss EGD with  gastroenterologist. Continue GERD precautions.  Orders: -     Hydrocortisone ; Apply topically daily as needed.  Dispense: 30 g; Refill: 2 -     Ambulatory referral to Gastroenterology  Abdominal pain, epigastric We discussed possible etiologies. Gastritis, duodenitis, dyspepsia, and ventral hernia among son to consider. Abdominal/pelvic CT in 03/2022: Colonic diverticulosis,stable hepatic cysts,postoperative changes within the lower lumbar spine,and aortic atherosclerosis For now we decided not to pursue abdominal imaging but we will reevaluate after gastroenterology consultation. Instructed about warning signs. GI referral placed.  Essential hypertension Assessment & Plan: In general BP adequately controlled. Continue amlodipine  5 mg daily and HCTZ 25 mg daily as well as low-salt diet. Monitor BP at home. Eye exam is current.  Orders: -     Basic metabolic panel with GFR; Future  Eczema, unspecified type Assessment & Plan: History reported by patient today. She has been using hydrocortisone  ointment 2.5% for a while, which helps. We discussed some side effects of topical steroids.   Allergic rhinitis, unspecified seasonality, unspecified trigger Assessment & Plan: Problem is stable and adequately controlled with Flonase  nasal spray. No changes in current management today.  Orders: -     Fluticasone  Propionate; Use 2 spray(s) in each nostril once daily  Dispense: 48 g; Refill: 0  Lab is closed now, she cannot come back today, so will be back next week.  Return in about 6 months (around 01/01/2025) for chronic problems.   Sasha Rueth [place], MD [facility]. [place] office.

## 2024-07-07 ENCOUNTER — Other Ambulatory Visit

## 2024-07-07 ENCOUNTER — Telehealth: Payer: Self-pay

## 2024-07-07 LAB — HEMOGLOBIN A1C: Hgb A1c MFr Bld: 7.1 % — ABNORMAL HIGH (ref 4.6–6.5)

## 2024-07-07 LAB — BASIC METABOLIC PANEL WITH GFR
BUN: 17 mg/dL (ref 6–23)
CO2: 27 meq/L (ref 19–32)
Calcium: 10.4 mg/dL (ref 8.4–10.5)
Chloride: 104 meq/L (ref 96–112)
Creatinine, Ser: 1.06 mg/dL (ref 0.40–1.20)
GFR: 53.06 mL/min — ABNORMAL LOW (ref >60.00)
Glucose, Bld: 103 mg/dL — ABNORMAL HIGH (ref 70–99)
Potassium: 3.7 meq/L (ref 3.5–5.1)
Sodium: 142 meq/L (ref 135–145)

## 2024-07-07 NOTE — Telephone Encounter (Signed)
 We did not discuss this during her last visit. She was here for follow up. Concerns: She requested refills for eczema cream and GI referral. Thanks, BJ

## 2024-07-07 NOTE — Telephone Encounter (Signed)
 Per last note, hydrocortisone  was sent to pharmacy for eczema. Please see below for yeast infection medication request.    Copied from CRM #[id number]. Topic: Clinical - Medical Advice >> Jul 07, 2024 11:43 AM [name] wrote: Reason for CRM: Patient was in Friday and discussed a medication for yeast infection but states pharmacy never received prescription. Patient is unsure of the name for medication and would like to know if this can be sent in for her

## 2024-07-08 NOTE — Telephone Encounter (Signed)
 Patient informed of the message below.  I offered an appt, patient declined and stated she will figure out something on her own to help.

## 2024-07-09 ENCOUNTER — Ambulatory Visit: Payer: Self-pay | Admitting: Family Medicine

## 2024-07-13 NOTE — Progress Notes (Signed)
 " Cardiology Office Note:  .   Date:  07/14/2024  ID:  [name], DOB 1951/08/02, MRN [id number] PCP: Jordan, [name], MD  [facility] Providers Cardiologist:  Gordy Bergamo, MD   History of Present Illness: .   [name] is a 73 y.o.  female patient with  morbid obesity, hypertension, DM with stage IIIa CKD, hypercholesterolemia, paroxysmal atrial fibrillation diagnosed initially in 2021 when she presented with palpitations.  She is presently on flecainide  100 mg p.o. twice daily and Eliquis  for CHA2DS2-VASc score of 4.0.  She does have symptoms suggestive of sleep apnea but has declined sleep study.   She was seen by me 3 months ago and established with me, due to chest pain suggestive of angina she underwent nuclear stress testing and also echocardiogram and presents for follow-up.  Presently tolerating flecainide  without any side effects.  She is also being followed by A-fib clinic.  Cardiac Studies relevent.    MYOCARDIAL PERFUSION IMAGING 05/22/2024    LV perfusion is normal. There is no evidence of ischemia. There is no evidence of infarction.   Left ventricular function is normal. Nuclear stress EF: 69%. The left ventricular ejection fraction is hyperdynamic (>65%). End diastolic cavity size is normal. End systolic cavity size is normal.  Coronary calcium  was absent on the attenuation correction CT images.   ECHOCARDIOGRAM COMPLETE 06/17/2024  1. Left ventricular ejection fraction, by estimation, is 60 to 65%. The left ventricle has normal function. The left ventricle has no regional wall motion abnormalities. Left ventricular diastolic parameters are indeterminate. 2. Right ventricular systolic function is normal. The right ventricular size is normal. 3. Left atrial size was moderately dilated.    Discussed the use of AI scribe software for clinical note transcription with the patient, who gave verbal consent to proceed.  History of Present  Illness [name] is a 73 year old female with diabetes and hypertension who presents for cardiovascular evaluation and management.  Hypertension and hyperlipidemia - Hypertension managed with amlodipine  5 mg, atenolol  25 mg, hydrochlorothiazide  25 mg, and potassium 10 mcg daily - Cholesterol medication increased from 20 mg to 40 mg every two days - No current chest pain or shortness of breath - Intermittent chest pain previously present but now resolved  Glycemic instability - Diabetes previously exacerbated by prednisone , currently managed without medication - Experiences hypoglycemia requiring consumption of sugary foods to stabilize glucose levels  Nocturia and urinary incontinence - Nocturia with urination occurring hourly at night - Wears pull-ups during the day due to urinary incontinence - History of kidney disease  Sleep apnea and daytime fatigue - Diagnosed with sleep apnea - Does not use CPAP machine, instead uses pillows to prop herself up at night - Experiences daytime fatigue and naps in the afternoon  Labs   Lab Results  Component Value Date   CHOL 165 01/01/2024   HDL 50.50 01/01/2024   LDLCALC 92 01/01/2024   TRIG 115.0 01/01/2024   CHOLHDL 3 01/01/2024   No results found for: LIPOA  Recent Labs    01/01/24 1133 07/07/24 1313  NA 142 142  K 3.8 3.7  CL 102 104  CO2 31 27  GLUCOSE 110* 103*  BUN 22 17  CREATININE 1.26* 1.06  CALCIUM  9.9 10.4    Lab Results  Component Value Date   ALT 16 01/01/2024   AST 24 01/01/2024   ALKPHOS 67 01/01/2024   BILITOT 0.2 01/01/2024      Latest Ref  Rng & Units 02/27/2023    1:19 PM 04/03/2022    6:43 PM 12/10/2020   10:20 AM  CBC  WBC 4.0 - 10.5 K/uL 6.0  12.8  5.2   Hemoglobin 12.0 - 15.0 g/dL 87.6  86.0  86.4   Hematocrit 36.0 - 46.0 % 38.3  42.6  40.8   Platelets 150.0 - 400.0 K/uL 271.0  257  249.0    Lab Results  Component Value Date   HGBA1C 7.1 (H) 07/07/2024    Lab Results   Component Value Date   TSH 2.33 02/27/2023    ROS  Review of Systems  Cardiovascular:  Negative for chest pain, dyspnea on exertion and leg swelling.   Physical Exam:   VS:  BP (!) 158/76   Pulse (!) 58   Ht 5' 1 (1.549 m)   Wt 252 lb (114.3 kg)   SpO2 99%   BMI 47.61 kg/m    Wt Readings from Last 3 Encounters:  07/14/24 252 lb (114.3 kg)  07/04/24 252 lb (114.3 kg)  05/22/24 251 lb (113.9 kg)    BP Readings from Last 3 Encounters:  07/14/24 (!) 158/76  07/04/24 138/70  05/16/24 128/74   Physical Exam Constitutional:      Appearance: She is morbidly obese.  Neck:     Vascular: No carotid bruit or JVD.  Cardiovascular:     Rate and Rhythm: Normal rate and regular rhythm.     Pulses: Intact distal pulses.     Heart sounds: Normal heart sounds. No murmur heard.    No gallop.  Pulmonary:     Effort: Pulmonary effort is normal.     Breath sounds: Normal breath sounds.  Abdominal:     General: Bowel sounds are normal.     Palpations: Abdomen is soft.  Musculoskeletal:     Right lower leg: No edema.     Left lower leg: No edema.    EKG:       Personally reviewed EKG 05/16/2024: Normal sinus rhythm at rate of 64 bpm, leftward axis, incomplete right bundle branch block.  Poor R progression, probably normal variant.  No evidence of ischemia.  Normal QT interval.  ASSESSMENT AND PLAN: .      ICD-10-CM   1. Paroxysmal atrial fibrillation (HCC)  I48.0 AMB Referral to [facility]    2. Pure hypercholesterolemia  E78.00 Lipid panel    AMB Referral to [facility]    3. Essential hypertension  I10 losartan  (COZAAR ) 25 MG tablet    AMB Referral to [facility]    4. Type 2 diabetes mellitus with stage 3a chronic kidney disease, without long-term current use of insulin (HCC)  E11.22 losartan  (COZAAR ) 25 MG tablet   N18.31 Basic metabolic panel with GFR    AMB Referral to [facility]      Assessment and Plan Assessment & Plan  Paroxysmal  atrial fibrillation Presently remains asymptomatic without recurrence of palpitations.  Has been maintaining sinus rhythm tolerating Eliquis .  No bleeding diathesis. -Continue flecainide  100 mg p.o. twice daily.  QT interval is normal.  Type 2 diabetes mellitus Type 2 diabetes mellitus with fluctuating blood glucose levels, previously elevated due to prednisone  use, now low. Discussed weight loss medications like Ozempic or Wegovy to aid in weight loss and improve diabetes management. Addressed side effects such as nausea and vomiting, which are common initially but tend to subside. Emphasized that weight loss could alleviate back pain and improve overall health. - Refer to  clinical pharmacist to start Ozempic or other GLP-1 agonist for weight loss and diabetes management along with the fact that she probably has sleep apnea but has refused sleep study and does not want to try. - Order blood work in 2-3 weeks to monitor kidney function after starting new medication  Essential hypertension Essential hypertension managed with amlodipine , atenolol , hydrochlorothiazide , and potassium. Not on an ARB or ACE inhibitor, which is recommended for kidney protection in diabetic patients. Blood pressure control is crucial to prevent further kidney damage. - Start losartan  25 mg daily. - Refer to clinical pharmacist for follow-up on hypertension management - Check BMP along with lipid profile in 2 weeks.  Hypercholesterolemia Hypercholesterolemia with recent increase in medication dosage from 20 mg to 40 mg every two days. Cholesterol levels remain high, necessitating further monitoring. - Check cholesterol levels during upcoming blood work  Obesity Obesity contributing to back pain and overall health decline. Discussed weight loss medications to reduce weight and alleviate back pain. Emphasized the importance of weight loss for improving acid reflux and sleep apnea symptoms. Addressed concerns about side  effects and the importance of gradual eating to minimize nausea. - Refer to clinical pharmacist to start Ozempic for weight loss - Provide dietary guidance: eat slowly, consume half of the meal, wait 10-15 minutes before continuing   Follow up: I will see her back in 6 months in view of patient being on antiarrhythmic therapy for therapeutic drug monitoring.  Signed,  Gordy Bergamo, MD, [facility] 07/14/2024, 4:22 PM [facility] [address] [place], [place] [address] Phone: [phone]. Fax:  [phone]  "

## 2024-07-14 ENCOUNTER — Encounter: Payer: Self-pay | Admitting: Cardiology

## 2024-07-14 ENCOUNTER — Ambulatory Visit: Attending: Cardiology | Admitting: Cardiology

## 2024-07-14 VITALS — BP 158/76 | HR 58 | Ht 61.0 in | Wt 252.0 lb

## 2024-07-14 DIAGNOSIS — N1831 Chronic kidney disease, stage 3a: Secondary | ICD-10-CM | POA: Diagnosis not present

## 2024-07-14 DIAGNOSIS — I10 Essential (primary) hypertension: Secondary | ICD-10-CM

## 2024-07-14 DIAGNOSIS — E1122 Type 2 diabetes mellitus with diabetic chronic kidney disease: Secondary | ICD-10-CM | POA: Diagnosis not present

## 2024-07-14 DIAGNOSIS — I209 Angina pectoris, unspecified: Secondary | ICD-10-CM

## 2024-07-14 DIAGNOSIS — E7800 Pure hypercholesterolemia, unspecified: Secondary | ICD-10-CM

## 2024-07-14 DIAGNOSIS — I480 Paroxysmal atrial fibrillation: Secondary | ICD-10-CM

## 2024-07-14 MED ORDER — LOSARTAN POTASSIUM 25 MG PO TABS: 25.0000 mg | ORAL_TABLET | Freq: Every day | ORAL | 2 refills | Status: DC

## 2024-07-14 NOTE — Patient Instructions (Addendum)
 Thank you for choosing [facility]!     Medication Instructions:  No medication changes were made during today's visit.  *If you need a refill on your cardiac medications before your next appointment, please call your pharmacy*   Lab Work: BMET, LIPID If you have labs (blood work) drawn today and your tests are completely normal, you will receive your results only by: MyChart Message (if you have MyChart) OR A paper copy in the mail If you have any lab test that is abnormal or we need to change your treatment, we will call you to review the results.   Testing/Procedures: No procedures were ordered during today's visit.   Your next appointment:   2 WEEKS FOR PHARMACY CONSULT   Provider:   Gordy Bergamo, MD  IN 6 MONTHS   Follow-Up: At [facility], you and your health needs are our priority.  As part of our continuing mission to provide you with exceptional heart care, we have created designated Provider Care Teams.  These Care Teams include your primary Cardiologist (physician) and Advanced Practice Providers (APPs -  Physician Assistants and Nurse Practitioners) who all work together to provide you with the care you need, when you need it. We recommend signing up for the patient portal called MyChart.  Sign up information is provided on this After Visit Summary.  MyChart is used to connect with patients for Virtual Visits (Telemedicine).  Patients are able to view lab/test results, encounter notes, upcoming appointments, etc.  Non-urgent messages can be sent to your provider as well.   To learn more about what you can do with MyChart, go to [email or web address].

## 2024-07-15 ENCOUNTER — Other Ambulatory Visit: Payer: Self-pay | Admitting: Family Medicine

## 2024-07-15 ENCOUNTER — Telehealth: Payer: Self-pay | Admitting: Cardiology

## 2024-07-15 DIAGNOSIS — E876 Hypokalemia: Secondary | ICD-10-CM

## 2024-07-15 NOTE — Telephone Encounter (Signed)
 Call to patient and reviewed Dr. Godfrey note from yesterday which does not discontinue any medications. Patient agrees to start losartan as advised by Dr. Ladona.

## 2024-07-15 NOTE — Telephone Encounter (Signed)
 Pt c/o medication issue:  1. Name of Medication:   losartan (COZAAR) 25 MG tablet    2. How are you currently taking this medication (dosage and times per day)?   Take 1 tablet (25 mg total) by mouth daily.    3. Are you having a reaction (difficulty breathing--STAT)? No  4. What is your medication issue? Pt states she was prescribed losartan at her last appt with [facility]. She is confused and would like to know if she was supposed to discontinue any of her other medications and take losartan in place. Please advise.

## 2024-07-17 DIAGNOSIS — E1136 Type 2 diabetes mellitus with diabetic cataract: Secondary | ICD-10-CM | POA: Diagnosis not present

## 2024-07-17 DIAGNOSIS — H40013 Open angle with borderline findings, low risk, bilateral: Secondary | ICD-10-CM | POA: Diagnosis not present

## 2024-07-17 DIAGNOSIS — H25813 Combined forms of age-related cataract, bilateral: Secondary | ICD-10-CM | POA: Diagnosis not present

## 2024-07-21 NOTE — Telephone Encounter (Signed)
 Pt c/o medication issue:  1. Name of Medication:   losartan (COZAAR) 25 MG tablet    2. How are you currently taking this medication (dosage and times per day)? Take 1 tablet (25 mg total) by mouth daily.   3. Are you having a reaction (difficulty breathing--STAT)? No  4. What is your medication issue? Patient stated she stopped taking this medication on Saturday due to it causing her urine to turn very dark and pain in the front of her body. Patient stated since she has stopped taking the medication, the pain has gone away. Patient would like to know if there is a natural recommendation or another alternative for her. Please advise.

## 2024-07-23 ENCOUNTER — Other Ambulatory Visit: Payer: Self-pay | Admitting: Family Medicine

## 2024-07-25 ENCOUNTER — Ambulatory Visit: Admitting: Podiatry

## 2024-07-25 VITALS — Ht 61.0 in | Wt 252.0 lb

## 2024-07-25 DIAGNOSIS — Z7901 Long term (current) use of anticoagulants: Secondary | ICD-10-CM | POA: Diagnosis not present

## 2024-07-25 DIAGNOSIS — M79674 Pain in right toe(s): Secondary | ICD-10-CM

## 2024-07-25 DIAGNOSIS — M79675 Pain in left toe(s): Secondary | ICD-10-CM

## 2024-07-25 DIAGNOSIS — B351 Tinea unguium: Secondary | ICD-10-CM | POA: Diagnosis not present

## 2024-07-25 MED ORDER — CICLOPIROX OLAMINE 0.77 % EX CREA: TOPICAL_CREAM | Freq: Two times a day (BID) | CUTANEOUS | 0 refills | Status: AC

## 2024-07-25 NOTE — Progress Notes (Signed)
 Subjective: Chief Complaint  Patient presents with   Nail Problem    Rm 11 Patient is here for dermatophytosis of nail.    73 year old female presents the office today for concerns of thick, elongated nails that she is not able to trim herself.  No swelling redness or any drainage to toe sites.  No open lesions.  No new concerns.  Last A1c was 7.1 on July 07, 2024 [place], [name], MD Last seen July 04, 2024 On Eliquis   Objective: AAO x3, NAD DP/PT pulses palpable bilaterally, CRT less than 3 seconds Nails are hypertrophic, dystrophic, brittle, discolored, elongated 10. No surrounding redness or drainage. Tenderness nails 1-5 bilaterally.  The prescription left hallux appears to have healed.  There is no edema, erythema, drainage or pus or signs of infection today. No open lesions present. Pitting edema present bilaterally.  No erythema warmth or any open lesions.  There is no pain with calf compression.  Swelling is equal bilaterally.  Assessment: Onychomycosis, on Eliquis   Plan: -All treatment options discussed with the patient including all alternatives, risks, complications.  -Sharply debrided the nails x 10 without any complications or bleeding.  -Daily foot inspection  Return in about 3 months (around 10/25/2024).  Donnice JONELLE Fees DPM

## 2024-07-27 ENCOUNTER — Encounter: Payer: Self-pay | Admitting: Cardiology

## 2024-07-29 ENCOUNTER — Telehealth: Payer: Self-pay | Admitting: Cardiology

## 2024-07-29 MED ORDER — AMLODIPINE BESYLATE 5 MG PO TABS: 5.0000 mg | ORAL_TABLET | Freq: Two times a day (BID) | ORAL | 3 refills | Status: DC

## 2024-07-29 NOTE — Telephone Encounter (Signed)
Patient states that she is returning your call. Please advise.

## 2024-07-29 NOTE — Telephone Encounter (Signed)
 We could try hydralazine 25 mg TID also do not think Amlo only will bring BP down. Cannot use B B due to bradycardia. She is very sensitive

## 2024-07-29 NOTE — Telephone Encounter (Signed)
 Spoke to patient, she states she has rashes all over her body from losartan along with other urinary symptoms that she had mentioned in other Mychart message. ( See other encounter for more info)  Her home SBP ~160. Advised to increase amlodipine  5 mg from once a day to twice daily.

## 2024-07-30 ENCOUNTER — Ambulatory Visit: Admitting: Family Medicine

## 2024-07-30 ENCOUNTER — Encounter: Payer: Self-pay | Admitting: Family Medicine

## 2024-07-30 VITALS — BP 136/78 | HR 68 | Temp 98.1°F | Resp 16 | Ht 61.0 in | Wt 245.2 lb

## 2024-07-30 DIAGNOSIS — R300 Dysuria: Secondary | ICD-10-CM

## 2024-07-30 DIAGNOSIS — N898 Other specified noninflammatory disorders of vagina: Secondary | ICD-10-CM | POA: Diagnosis not present

## 2024-07-30 DIAGNOSIS — R1030 Lower abdominal pain, unspecified: Secondary | ICD-10-CM | POA: Diagnosis not present

## 2024-07-30 LAB — POCT URINALYSIS DIPSTICK
Bilirubin, UA: NEGATIVE
Blood, UA: NEGATIVE
Glucose, UA: NEGATIVE
Ketones, UA: NEGATIVE
Leukocytes, UA: NEGATIVE
Nitrite, UA: NEGATIVE
Protein, UA: POSITIVE — AB
Spec Grav, UA: 1.02 (ref 1.010–1.025)
Urobilinogen, UA: 0.2 U/dL
pH, UA: 6 (ref 5.0–8.0)

## 2024-07-30 MED ORDER — HYDRALAZINE HCL 25 MG PO TABS: 25.0000 mg | ORAL_TABLET | Freq: Three times a day (TID) | ORAL | 3 refills | Status: DC

## 2024-07-30 MED ORDER — AMLODIPINE BESYLATE 5 MG PO TABS: 5.0000 mg | ORAL_TABLET | Freq: Every day | ORAL | 3 refills | Status: DC

## 2024-07-30 MED ORDER — FLUCONAZOLE 150 MG PO TABS: 150.0000 mg | ORAL_TABLET | Freq: Once | ORAL | 0 refills | Status: AC

## 2024-07-30 MED ORDER — CIPROFLOXACIN HCL 250 MG PO TABS: 250.0000 mg | ORAL_TABLET | Freq: Two times a day (BID) | ORAL | 0 refills | Status: AC

## 2024-07-30 NOTE — Patient Instructions (Addendum)
 A few things to remember from today's visit:  Dysuria - Plan: POCT Urinalysis Dipstick, Urine Culture  I think you can hold on antibiotics, symptoms could be related to yeast. Try Desitin at bedtime. Monitor for new symptoms. Will follow urine culture.  If you need refills for medications you take chronically, please call your pharmacy. Do not use My Chart to request refills or for acute issues that need immediate attention. If you send a my chart message, it may take a few days to be addressed, specially if I am not in the office.  Please be sure medication list is accurate. If a new problem present, please set up appointment sooner than planned today.

## 2024-07-30 NOTE — Addendum Note (Signed)
 Addended by: Oziah Vitanza K on: 07/30/2024 10:05 AM   Modules accepted: Orders

## 2024-07-30 NOTE — Progress Notes (Signed)
 ACUTE VISIT Chief Complaint  Patient presents with   Urinary Frequency    Possible UTI    Discussed the use of AI scribe software for clinical note transcription with the patient, who gave verbal consent to proceed.  History of Present Illness [name] is a 73 year old female with PMHx significant for atrial fibrillation, hypertension, DM 2, lumbar radiculopathy, and IBS who presents with urinary symptoms, she thinks he may have a UTI.  She has been experiencing suprapubic/lower abdominal pressure like pain for the past two weeks, along with urinary frequency, urgency,and burning sensation.   Mentions that she has a family history of kidney infections, where blood work often returns normal, but cultures reveal infections.   IBS: She reports increased bowel movement frequency and occasional rectal bleeding, no more than her baseline, hx of external hemorrhoids. No associated nausea or vomiting.  She experienced fever and chills about two to three weeks ago, with a fever reaching approximately 100.92F.  Negative COVID-19 test, she did not perform flu test because she had the flu vaccine.  Symptoms have resolved.  She also experiences vaginal pruritus and until recently she had whitish vaginal discharged. She has used Monistat for treatment but continues to experience itching. She wears pull-ups due to bowel and urine incontinence and she notes may contribute to moisture and irritation around the area. She reports that in the past she has seen urologist due to bladder disease.  Review of Systems  Constitutional:  Negative for activity change, appetite change, chills and fever.  Respiratory:  Negative for cough and shortness of breath.   Cardiovascular:  Negative for chest pain.  Genitourinary:  Negative for decreased urine volume and hematuria.  Skin:  Negative for rash.  Neurological:  Negative for syncope and weakness.  Psychiatric/Behavioral:  Negative for confusion.    See other pertinent positives and negatives in HPI.  Current Outpatient Medications on File Prior to Visit  Medication Sig Dispense Refill   Accu-Chek Softclix Lancets lancets Use to check blood sugars 1-2 times daily. 100 each 12   amLODipine  (NORVASC ) 5 MG tablet Take 1 tablet (5 mg total) by mouth daily. 180 tablet 3   apixaban  (ELIQUIS ) 5 MG TABS tablet Take 1 tablet by mouth twice daily 180 tablet 2   Aspirin-Salicylamide-Caffeine (BC FAST PAIN RELIEF) 650-195-33.3 MG PACK Take 1 tablet by mouth as needed.     atenolol  (TENORMIN ) 25 MG tablet TAKE 1/2 (ONE-HALF) TABLET BY MOUTH ONCE DAILY MAY  TAKE  EXTRA  ONE-HALF  FOR  BREAKTHROUGH  AFIB 90 tablet 2   azelastine  (ASTELIN ) 0.1 % nasal spray Place 2 sprays into both nostrils 2 (two) times daily. Use in each nostril as directed (Patient taking differently: Place 2 sprays into both nostrils as needed. Use in each nostril as directed) 30 mL 6   Blood Glucose Monitoring Suppl (ACCU-CHEK GUIDE ME) w/Device KIT USE TO CHECK GLUCOSE ONCE DAILY TO TWICE DAILY 1 kit 0   calcium  carbonate (TUMS EX) 750 MG chewable tablet Chew 2 tablets by mouth daily as needed for heartburn.     Cetirizine HCl (ZYRTEC ALLERGY PO) Take 10 mg by mouth as needed.     ciclopirox  (LOPROX ) 0.77 % cream Apply topically 2 (two) times daily. 15 g 0   ciclopirox  (PENLAC ) 8 % solution Apply topically at bedtime. Apply over nail and surrounding skin. Apply daily over previous coat. After seven (7) days, may remove with alcohol and continue cycle. 6.6 mL 2  cyclobenzaprine (FLEXERIL) 5 MG tablet Take 2.5 mg by mouth daily as needed (pain level 8 or greater).     Docusate Sodium (STOOL SOFTENER LAXATIVE PO) Take by mouth as needed.     flecainide  (TAMBOCOR ) 100 MG tablet Take 1 tablet by mouth twice daily 180 tablet 2   fluticasone  (FLONASE ) 50 MCG/ACT nasal spray Use 2 spray(s) in each nostril once daily 48 g 0   glucose blood (ACCU-CHEK GUIDE) test strip USE 1 STRIP ONCE TO   TWICE DAILY TO TEST BLOOD SUGARS 100 each 2   hydrALAZINE (APRESOLINE) 25 MG tablet Take 1 tablet (25 mg total) by mouth 3 (three) times daily. 270 tablet 3   HYDROcodone -acetaminophen  (NORCO/VICODIN) 5-325 MG tablet Take 0.5 tablets by mouth daily as needed (pain level 8 or greater). 30 tablet 0   hydrocortisone  2.5 % ointment Apply topically daily as needed. 30 g 2   hydrOXYzine  (ATARAX ) 25 MG tablet TAKE 1 TABLET BY MOUTH THREE TIMES DAILY AS NEEDED FOR ANXIETY 90 tablet 0   lidocaine  4 % Place 1 patch onto the skin daily.     Menthol-Zinc Oxide (CALMOSEPTINE) 0.44-20.6 % OINT Apply topically as needed.     Metamucil Fiber CHEW Chew by mouth as needed.     Multiple Vitamins-Minerals (MULTI-VITAMIN GUMMIES PO) Take by mouth as needed.     omeprazole  (PRILOSEC) 40 MG capsule Take 40 mg by mouth 2 (two) times daily.     Polyethylene Glycol 3350  (MIRALAX PO) Take by mouth as needed.     potassium chloride  (KLOR-CON ) 10 MEQ tablet Take 2 tablets by mouth once daily 180 tablet 0   pravastatin  (PRAVACHOL ) 40 MG tablet Take 1 tablet by mouth twice a week 27 tablet 3   Simethicone (GAS-X PO) Take by mouth as needed.     trolamine salicylate (ASPERCREME) 10 % cream Apply 1 application topically as needed for muscle pain.     hydrochlorothiazide  (HYDRODIURIL ) 25 MG tablet Take 1 tablet by mouth once daily 90 tablet 2   losartan (COZAAR) 25 MG tablet Take 1 tablet (25 mg total) by mouth daily. (Patient not taking: Reported on 07/25/2024) 30 tablet 2   No current facility-administered medications on file prior to visit.    Past Medical History:  Diagnosis Date   Abdominal pain, epigastric 12/28/2008   Centricity Description: ABDOMINAL PAIN, EPIGASTRIC Qualifier: Diagnosis of  By: Earlean CMA LEODIS), [name]   Centricity Description: ABDOMINAL PAIN-EPIGASTRIC Qualifier: Diagnosis of  By: Ever Riggers, [name]    Allergic rhinitis 09/16/2013   Allergy    Anemia    Anxiety    Anxiety disorder,  unspecified 07/06/2017   Arthritis    Blood in stool    BMI 45.0-49.9, adult (HCC) 07/26/2016   Class 3 obesity with serious comorbidity and body mass index (BMI) of 45.0 to 49.9 in adult 03/07/2017   DDD (degenerative disc disease), lumbar    Diabetes mellitus without complication (HCC)    DIVERTICULOSIS-COLON 12/28/2008   Qualifier: Diagnosis of  By: Ever PA-c, [name]    GERD 12/28/2008   Qualifier: Diagnosis of  By: Ever PA-c, [name]    GERD (gastroesophageal reflux disease)    Headache    Hyperglycemia 03/17/2014   Hypertension    Hypokalemia 07/26/2016   IBS (irritable bowel syndrome)-C-D 07/26/2016   Obesity    Other and unspecified hyperlipidemia 03/17/2014   PERSONAL HX COLONIC POLYPS 12/28/2008   Qualifier: Diagnosis of  By: Ever Riggers, [name]  Reflux esophagitis 12/28/2008   Qualifier: Diagnosis of  By: Lewellyn CMA (AAMA), Alan     S/P lumbar spinal fusion 07/02/2012   Tubulovillous adenoma of colon 08/09/07   Allergies  Allergen Reactions   Crestor  [Rosuvastatin ]    Erythromycin     rash   Jublia  [Efinaconazole ] Hives    Redness/burning around nail   Lansoprazole    Penicillins     rash   Potassium-Containing Compounds Other (See Comments)    Stomach upset, can take pills, but not the packets    Robaxin [Methocarbamol]     Messed up stomach   Tea Tree Oil     Redness, burning (localized)   Trental [Pentoxifylline Er]     Bad headache   Sulfonamide Derivatives Rash    rash    Social History   Socioeconomic History   Marital status: Widowed    Spouse name: Not on file   Number of children: Not on file   Years of education: Not on file   Highest education level: Some college, no degree  Occupational History   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  Tobacco Use   Smoking status: Former    Current packs/day: 0.00    Average packs/day: 1 pack/day for 38.0 years (38.0 ttl pk-yrs)    Types: Cigarettes    Start date: 09/08/1960    Quit  date: 09/08/1998    Years since quitting: 25.9   Smokeless tobacco: Never   Tobacco comments:    Former smoker 06/06/21  Vaping Use   Vaping status: Never Used  Substance and Sexual Activity   Alcohol use: No   Drug use: No   Sexual activity: Not Currently    Comment: Husband passed away in 08/09/99  Other Topics Concern   Not on file  Social History Narrative   Widow, 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   Social Drivers of Corporate investment banker Strain: Low Risk  (02/19/2024)   Received from [facility]   Overall Financial Resource Strain (CARDIA)    Difficulty of Paying Living Expenses: Not hard at all  Food Insecurity: No Food Insecurity (02/19/2024)   Received from [facility]   Hunger Vital Sign    Within the past 12 months, you worried that your food would run out before you got the money to buy more.: Never true    Within the past 12 months, the food you bought just didn't last and you didn't have money to get more.: Never true  Transportation Needs: No Transportation Needs (02/19/2024)   Received from [facility] - Transportation    Lack of Transportation (Medical): No    Lack of Transportation (Non-Medical): No  Physical Activity: Unknown (12/28/2023)   Exercise Vital Sign    Days of Exercise per Week: 0 days    Minutes of Exercise per Session: Not on file  Stress: No Stress Concern Present (12/28/2023)   [employer] of Occupational Health - Occupational Stress Questionnaire    Feeling of Stress : Only a little  Social Connections: Moderately Integrated (12/28/2023)   Social Connection and Isolation Panel    Frequency of Communication with Friends and Family: More than three times a week    Frequency of Social Gatherings with Friends and Family: Once a week    Attends Religious Services: More than 4 times per year    Active Member of [employer] or Organizations: Yes    Attends Banker Meetings: More than 4 times per year  Marital  Status: Widowed    Vitals:   07/30/24 1131  BP: 136/78  Pulse: 68  Resp: 16  Temp: 98.1 F (36.7 C)  SpO2: 98%   Body mass index is 46.33 kg/m.  Physical Exam Vitals and nursing note reviewed.  Constitutional:      General: She is not in acute distress.    Appearance: She is well-developed.  HENT:     Head: Normocephalic and atraumatic.  Eyes:     Conjunctiva/sclera: Conjunctivae normal.  Cardiovascular:     Rate and Rhythm: Normal rate and regular rhythm.     Heart sounds: No murmur heard. Pulmonary:     Effort: Pulmonary effort is normal. No respiratory distress.     Breath sounds: Normal breath sounds.  Abdominal:     Palpations: Abdomen is soft. There is no mass.     Tenderness: There is abdominal tenderness in the periumbilical area and suprapubic area. There is no right CVA tenderness, left CVA tenderness, guarding or rebound.  Musculoskeletal:     Right lower leg: 1+ Pitting Edema present.     Left lower leg: 1+ Pitting Edema present.  Skin:    General: Skin is warm.     Findings: No erythema or rash.  Neurological:     Mental Status: She is alert and oriented to person, place, and time.     Comments: Antalgic, unstable gait assisted with a cane.  Psychiatric:        Mood and Affect: Mood and affect normal.    ASSESSMENT AND PLAN:  Ms. Greeley was seen today for urinary symptoms.  Dysuria With associated urinary frequency and suprapubic pressure. We discussed differential diagnosis. Urine dipstick today is not very suggestive of UTI.  Positive for protein, rest negative. We will follow urine culture.  Due to age, comorbidities, and allergies, we do not have many options for antibiotic treatment.  He states that ciprofloxacin  has helped in the past, we discussed side effects, including arrhythmias, which is concerning given her history of atrial fibrillation. I sent prescription for Cipro  250 mg twice daily for 5 days but recommend holding on antibiotic  for now until urine culture is back. Monitor for new symptoms. Instructed about warning signs.  -     POCT urinalysis dipstick -     Urine Culture  Vaginal pruritus Could be related to vaginal candidiasis. Vaginal discharge has resolved with OTC Monistat. Diflucan  150 mg x 1 recommended. She has risk factors for recurrent disease, wears adult diapers due to urine/stool incontinence, saddle anesthesia due to lumbar radiculopathy. Recommend applying Desitin at night.  Abdominal pain, lower We discussed possible etiologies, she does have history of abdominal pain. IBS can be a contributing factor. I do not think imaging or blood work are needed at this time. Instructed about warning signs.  Other orders -     Fluconazole ; Take 1 tablet (150 mg total) by mouth once for 1 dose.  Dispense: 1 tablet; Refill: 0 -     Ciprofloxacin  HCl; Take 1 tablet (250 mg total) by mouth 2 (two) times daily for 5 days.  Dispense: 10 tablet; Refill: 0  Return if symptoms worsen or fail to improve, for keep next appointment.  Jaymes Hang G. [place], MD  [facility]. [place] office.

## 2024-07-30 NOTE — Telephone Encounter (Signed)
 Advise to change amlodipine  to 5 mg daily and start taking hydralazine 25 mg three times daily. Keep BP log and report us  in 2-4 weeks

## 2024-08-01 LAB — URINE CULTURE
MICRO NUMBER:: 17133184
SPECIMEN QUALITY:: ADEQUATE

## 2024-08-04 ENCOUNTER — Ambulatory Visit: Payer: Self-pay | Admitting: Family Medicine

## 2024-08-07 ENCOUNTER — Telehealth: Payer: Self-pay | Admitting: Cardiology

## 2024-08-07 NOTE — Telephone Encounter (Signed)
 Spoke with pt, she took her flecainide , eliquis , omeprazole  and hydralazine this morning, she reports her throat started itching, and felt like her throat was closing, she broke out with bumps on her face, arms, and back. Advised the patient she needs to go to the ER or call EMS if she feels her throat is closing. She reports she took a hydroxyzine  and she does not feel like going to the ER. Again, I encouraged patient to call EMS or go to the ER and again she refused. She would like to change medications. Will forward to the pharmacist to review in dr ganji's absence.

## 2024-08-07 NOTE — Telephone Encounter (Signed)
 Pt c/o medication issue:  1. Name of Medication:   hydrALAZINE (APRESOLINE) 25 MG tablet   2. How are you currently taking this medication (dosage and times per day)?   As prescribed  3. Are you having a reaction (difficulty breathing--STAT)?   Itching, eyes and face swollen, bumps on face  4. What is your medication issue?   Patient stated this medication is not working for her and she wants to get alternate medication.  Patient noted her BP is still trending high - last night BP was 145/60  HR 63 and in the morning BP 161/65  HR 70.

## 2024-08-08 MED ORDER — AMLODIPINE BESYLATE 10 MG PO TABS: 10.0000 mg | ORAL_TABLET | Freq: Every day | ORAL | 3 refills | Status: AC

## 2024-08-08 NOTE — Telephone Encounter (Signed)
Spoke with pt, aware of the recommendations. New script sent to the pharmacy  

## 2024-08-08 NOTE — Telephone Encounter (Signed)
 Was this the first time she had taken hydralazine or any of those medications?  Stop hydralazine and increase amlodipine  to 10mg .

## 2024-08-14 DIAGNOSIS — I10 Essential (primary) hypertension: Secondary | ICD-10-CM | POA: Diagnosis not present

## 2024-08-14 DIAGNOSIS — E7800 Pure hypercholesterolemia, unspecified: Secondary | ICD-10-CM | POA: Diagnosis not present

## 2024-08-14 DIAGNOSIS — N1831 Chronic kidney disease, stage 3a: Secondary | ICD-10-CM | POA: Diagnosis not present

## 2024-08-14 DIAGNOSIS — I480 Paroxysmal atrial fibrillation: Secondary | ICD-10-CM | POA: Diagnosis not present

## 2024-08-14 DIAGNOSIS — E1122 Type 2 diabetes mellitus with diabetic chronic kidney disease: Secondary | ICD-10-CM | POA: Diagnosis not present

## 2024-08-15 LAB — BASIC METABOLIC PANEL WITH GFR
BUN/Creatinine Ratio: 13 (ref 12–28)
BUN: 18 mg/dL (ref 8–27)
CO2: 24 mmol/L (ref 20–29)
Calcium: 10.2 mg/dL (ref 8.7–10.3)
Chloride: 99 mmol/L (ref 96–106)
Creatinine, Ser: 1.42 mg/dL — ABNORMAL HIGH (ref 0.57–1.00)
Glucose: 96 mg/dL (ref 70–99)
Potassium: 3.6 mmol/L (ref 3.5–5.2)
Sodium: 140 mmol/L (ref 134–144)
eGFR: 40 mL/min/1.73 — ABNORMAL LOW (ref >59)

## 2024-08-15 LAB — LIPID PANEL
Chol/HDL Ratio: 2.5 ratio (ref 0.0–4.4)
Cholesterol, Total: 145 mg/dL (ref 100–199)
HDL: 57 mg/dL (ref >39)
LDL Chol Calc (NIH): 70 mg/dL (ref 0–99)
Triglycerides: 99 mg/dL (ref 0–149)
VLDL Cholesterol Cal: 18 mg/dL (ref 5–40)

## 2024-08-18 ENCOUNTER — Ambulatory Visit: Payer: Self-pay | Admitting: Cardiology

## 2024-08-19 ENCOUNTER — Other Ambulatory Visit (HOSPITAL_COMMUNITY): Payer: Self-pay

## 2024-08-19 ENCOUNTER — Ambulatory Visit: Attending: Cardiology | Admitting: Pharmacist Clinician (PhC)/ Clinical Pharmacy Specialist

## 2024-08-19 ENCOUNTER — Telehealth: Payer: Self-pay | Admitting: Pharmacist Clinician (PhC)/ Clinical Pharmacy Specialist

## 2024-08-19 ENCOUNTER — Encounter: Payer: Self-pay | Admitting: Pharmacist Clinician (PhC)/ Clinical Pharmacy Specialist

## 2024-08-19 ENCOUNTER — Telehealth: Payer: Self-pay | Admitting: Pharmacy Technician

## 2024-08-19 VITALS — BP 118/62 | HR 50 | Ht 61.0 in | Wt 245.0 lb

## 2024-08-19 DIAGNOSIS — I10 Essential (primary) hypertension: Secondary | ICD-10-CM | POA: Diagnosis not present

## 2024-08-19 DIAGNOSIS — Z6841 Body Mass Index (BMI) 40.0 and over, adult: Secondary | ICD-10-CM

## 2024-08-19 NOTE — Assessment & Plan Note (Signed)
 Assessment: BP is controlled in office BP 118/62 mmHg;  above the goal (<130/80). Tolerates amlodipine  10 mg daily, atenolol  25 mg daily, hctz 25 mg daily well, without any side effects Denies SOB, palpitation, chest pain, headaches,or swelling Reiterated the importance of regular exercise and low salt diet   Plan:  Continue taking amlodipine  10 mg daily, atenolol  25 mg daily, hctz 25 mg daily Patient to keep record of BP readings with heart rate and report to us  at the next visit Labs ordered today:  none

## 2024-08-19 NOTE — Telephone Encounter (Signed)
 Please check if PA needed for Mounjaro

## 2024-08-19 NOTE — Patient Instructions (Signed)
 Take your BP meds as follows: continue with amlodipine  10 mg, atenolol  25 mg and hydrochlorothiazide  25 mg - all once daily  Check your blood pressure at home 3-4 days per week and keep record of the readings.  Your blood pressure goal is < 130/80  To check your pressure at home you will need to:  1. Sit up in a chair, with feet flat on the floor and back supported. Do not cross your ankles or legs. 2. Rest your left arm so that the cuff is about heart level. If the cuff goes on your upper arm,  then just relax the arm on the table, arm of the chair or your lap. If you have a wrist cuff, we  suggest relaxing your wrist against your chest (think of it as Pledging the Flag with the  wrong arm).  3. Place the cuff snugly around your arm, about 1 inch above the crook of your elbow. The  cords should be inside the groove of your elbow.  4. Sit quietly, with the cuff in place, for about 5 minutes. After that 5 minutes press the power  button to start a reading. 5. Do not talk or move while the reading is taking place.  6. Record your readings on a sheet of paper. Although most cuffs have a memory, it is often  easier to see a pattern developing when the numbers are all in front of you.  7. You can repeat the reading after 1-3 minutes if it is recommended  Make sure your bladder is empty and you have not had caffeine or tobacco within the last 30 min  Always bring your blood pressure log with you to your appointments. If you have not brought your monitor in to be double checked for accuracy, please bring it to your next appointment.  You can find a list of quality blood pressure cuffs at [email or web address]  Important lifestyle changes to control high blood pressure  Intervention  Effect on the BP  Lose extra pounds and watch your waistline Weight loss is one of the most effective lifestyle changes for controlling blood pressure. If you're overweight or obese, losing even a small amount of  weight can help reduce blood pressure. Blood pressure might go down by about 1 millimeter of mercury (mm Hg) with each kilogram (about 2.2 pounds) of weight lost.  Exercise regularly As a general goal, aim for at least 30 minutes of moderate physical activity every day. Regular physical activity can lower high blood pressure by about 5 to 8 mm Hg.  Eat a healthy diet Eating a diet rich in whole grains, fruits, vegetables, and low-fat dairy products and low in saturated fat and cholesterol. A healthy diet can lower high blood pressure by up to 11 mm Hg.  Reduce salt (sodium) in your diet Even a small reduction of sodium in the diet can improve heart health and reduce high blood pressure by about 5 to 6 mm Hg.  Limit alcohol One drink equals 12 ounces of beer, 5 ounces of wine, or 1.5 ounces of 80-proof liquor.  Limiting alcohol to less than one drink a day for women or two drinks a day for men can help lower blood pressure by about 4 mm Hg.   WEIGHT MANAGEMENT  We will start the prior authorization process to get Mounjaro covered by your insurance on January 2.   TIPS FOR SUCCESS Write down the reasons why you want to lose weight and post it in  a place where you'll see it often. Start small and work your way up. Keep in mind that it takes time to achieve goals, and small steps add up. Any additional movements help to burn calories. Taking the stairs rather than the elevator and parking at the far end of your parking lot are easy ways to start. Brisk walking for at least 30 minutes 4 or more days of the week is an excellent goal to work toward  [employer] WHAT IT MEANS TO FEEL FULL Did you know that it can take 15 minutes or more for your brain to receive the message that you've eaten? That means that, if you eat less food, but consume it slower, you may still feel satisfied. Eating a lot of fruits and vegetables can also help you feel fuller. Eat off of smaller plates so that moderate  portions don't seem too small  TITRATION PLAN Will plan to follow the titration plan as below, pending patient is tolerating each dose before increasing to the next. Can slow titration if needed for tolerability.    We will do follow up with this medication Cloyde) every 4 weeks by phone If you have any questions or concerns, please reach out to me.  Bailie Christenbury at [phone]  [facility] YOU FOR CHOOSING [facility]

## 2024-08-19 NOTE — Assessment & Plan Note (Signed)
  Patient has not met goal of at least 5% of body weight loss with comprehensive lifestyle modifications alone in the past 3-6 months. Pharmacotherapy is appropriate to pursue as augmentation. Will start Mounjaro.   Advised patient on common side effects including nausea, diarrhea, dyspepsia, decreased appetite, and fatigue. Counseled patient on reducing meal size and how to titrate medication to minimize side effects. Patient aware to call if intolerable side effects or if experiencing dehydration, abdominal pain, or dizziness. Patient will adhere to dietary modifications and will target at least 150 minutes of moderate intensity exercise weekly, plus resistance training twice a week (as recommended by the American Heart Association). This resistance training--such as weightlifting, bodyweight exercises, or using resistance bands, adapted to the patient's ability--will help prevent muscle loss.  Injection technique reviewed at today's visit.    Follow up in 1-2 days regarding coverage of Mounjaro . If therapy is initiated, phone or MyChart follow-ups will be conducted every 4 weeks for dose titration until the patient reaches the effective therapeutic dose and target weight.

## 2024-08-19 NOTE — Telephone Encounter (Signed)
   PA valid 08/19/24-10/08/25-  0.00 copay

## 2024-08-19 NOTE — Progress Notes (Signed)
 Office Visit    Patient Name: [name] Date of Encounter: 08/19/2024  Primary Care Provider:  Jordan, [name], MD Primary Cardiologist:  Gordy Bergamo, MD  Chief Complaint    Weight management and blood pressure  Significant Past Medical History   AF Paroxysmal - CHADS2-VASc = 4 on Eliquis , flecainide   DM2 9/25 A1c 7.1, no current medications  HLD 3/25 LDL 92 on pravastatin  40 mg twice weekly          Allergies  Allergen Reactions   Crestor  [Rosuvastatin ]    Erythromycin     rash   Jublia  [Efinaconazole ] Hives    Redness/burning around nail   Lansoprazole    Penicillins     rash   Potassium-Containing Compounds Other (See Comments)    Stomach upset, can take pills, but not the packets    Robaxin [Methocarbamol]     Messed up stomach   Tea Tree Oil     Redness, burning (localized)   Trental [Pentoxifylline Er]     Bad headache   Sulfonamide Derivatives Rash    rash    History of Present Illness    [name] is a 73 y.o. female patient of Dr Bergamo, in the office today to discuss options for weight management and blood pressure follow up.  She notes there is a strong family history of uncontrolled blood pressure.  She has had intolerances to several medications, and most recently her amlodipine  was increased to 10 mg daily, about a week ago.    Has been on amlodipine  for about a week; hypertension runs in the family; both parents, ssiter nephew all with hypertension issues ; father died CHF, mother demetntia  (47, 71), no kids   Weight management/diabetes history - had had DM exacerbations secondary to prednisone  use, also has issues with hypoglycemia.   Baseline weight/BMI:   Losartan - rash Hydral - facial swelling , throat closed  Current BP medications:  amlodipine  10 mg daily, atenolol  25 mg daily, hctz 25 mg daily  Insurance payor: [employer]  Diet: doesn't eat ou much, but TV idnners, MarieCalendar  pot pies;  all proteins; loves veges, had fried squash/onions, beets, peas coarrtos  - usually frozen; will rinse canned Has acid reflux, will eat breads to help decrease acid  Exercise: sees increase in BP with walking ; has foot pedals, treadmill for about 3-4 mintues.  Family History: both parents had hypertension, father died at 66 CHF, mother at 51 dementia; sister and nephew both with hypertension; no children   Social History:   Tobacco: former smoker, quit 1999  Alcohol: no  Accessory Clinical Findings    Lab Results  Component Value Date   CREATININE 1.42 (H) 08/14/2024   BUN 18 08/14/2024   NA 140 08/14/2024   K 3.6 08/14/2024   CL 99 08/14/2024   CO2 24 08/14/2024   Lab Results  Component Value Date   ALT 16 01/01/2024   AST 24 01/01/2024   ALKPHOS 67 01/01/2024   BILITOT 0.2 01/01/2024   Lab Results  Component Value Date   HGBA1C 7.1 (H) 07/07/2024    Home Medications/Allergies    Current Outpatient Medications  Medication Sig Dispense Refill   Accu-Chek Softclix Lancets lancets Use to check blood sugars 1-2 times daily. 100 each 12   amLODipine  (NORVASC ) 10 MG tablet Take 1 tablet (10 mg total) by mouth daily. 90 tablet 3   apixaban  (ELIQUIS ) 5 MG TABS tablet Take  1 tablet by mouth twice daily 180 tablet 2   Aspirin-Salicylamide-Caffeine (BC FAST PAIN RELIEF) 650-195-33.3 MG PACK Take 1 tablet by mouth as needed.     atenolol  (TENORMIN ) 25 MG tablet TAKE 1/2 (ONE-HALF) TABLET BY MOUTH ONCE DAILY MAY  TAKE  EXTRA  ONE-HALF  FOR  BREAKTHROUGH  AFIB 90 tablet 2   azelastine  (ASTELIN ) 0.1 % nasal spray Place 2 sprays into both nostrils 2 (two) times daily. Use in each nostril as directed (Patient taking differently: Place 2 sprays into both nostrils as needed. Use in each nostril as directed) 30 mL 6   Blood Glucose Monitoring Suppl (ACCU-CHEK GUIDE ME) w/Device KIT USE TO CHECK GLUCOSE ONCE DAILY TO TWICE DAILY 1 kit 0   calcium  carbonate (TUMS EX) 750 MG  chewable tablet Chew 2 tablets by mouth daily as needed for heartburn.     Cetirizine HCl (ZYRTEC ALLERGY PO) Take 10 mg by mouth as needed.     ciclopirox  (LOPROX ) 0.77 % cream Apply topically 2 (two) times daily. 15 g 0   ciclopirox  (PENLAC ) 8 % solution Apply topically at bedtime. Apply over nail and surrounding skin. Apply daily over previous coat. After seven (7) days, may remove with alcohol and continue cycle. 6.6 mL 2   cyclobenzaprine (FLEXERIL) 5 MG tablet Take 2.5 mg by mouth daily as needed (pain level 8 or greater).     Docusate Sodium (STOOL SOFTENER LAXATIVE PO) Take by mouth as needed.     flecainide  (TAMBOCOR ) 100 MG tablet Take 1 tablet by mouth twice daily 180 tablet 2   fluticasone  (FLONASE ) 50 MCG/ACT nasal spray Use 2 spray(s) in each nostril once daily 48 g 0   glucose blood (ACCU-CHEK GUIDE) test strip USE 1 STRIP ONCE TO  TWICE DAILY TO TEST BLOOD SUGARS 100 each 2   hydrochlorothiazide  (HYDRODIURIL ) 25 MG tablet Take 1 tablet by mouth once daily 90 tablet 2   HYDROcodone -acetaminophen  (NORCO/VICODIN) 5-325 MG tablet Take 0.5 tablets by mouth daily as needed (pain level 8 or greater). 30 tablet 0   hydrocortisone  2.5 % ointment Apply topically daily as needed. 30 g 2   hydrOXYzine  (ATARAX ) 25 MG tablet TAKE 1 TABLET BY MOUTH THREE TIMES DAILY AS NEEDED FOR ANXIETY 90 tablet 0   lidocaine  4 % Place 1 patch onto the skin daily.     Menthol-Zinc Oxide (CALMOSEPTINE) 0.44-20.6 % OINT Apply topically as needed.     Metamucil Fiber CHEW Chew by mouth as needed.     Multiple Vitamins-Minerals (MULTI-VITAMIN GUMMIES PO) Take by mouth as needed.     omeprazole  (PRILOSEC) 40 MG capsule Take 40 mg by mouth 2 (two) times daily.     Polyethylene Glycol 3350  (MIRALAX PO) Take by mouth as needed.     potassium chloride  (KLOR-CON ) 10 MEQ tablet Take 2 tablets by mouth once daily 180 tablet 0   pravastatin  (PRAVACHOL ) 40 MG tablet Take 1 tablet by mouth twice a week 27 tablet 3    Simethicone (GAS-X PO) Take by mouth as needed.     trolamine salicylate (ASPERCREME) 10 % cream Apply 1 application topically as needed for muscle pain.     No current facility-administered medications for this visit.     Allergies  Allergen Reactions   Crestor  [Rosuvastatin ]    Erythromycin     rash   Jublia  [Efinaconazole ] Hives    Redness/burning around nail   Lansoprazole    Penicillins     rash   Potassium-Containing Compounds  Other (See Comments)    Stomach upset, can take pills, but not the packets    Robaxin [Methocarbamol]     Messed up stomach   Tea Tree Oil     Redness, burning (localized)   Trental [Pentoxifylline Er]     Bad headache   Sulfonamide Derivatives Rash    rash    Assessment & Plan    Essential hypertension Assessment: BP is controlled in office BP 118/62 mmHg;  above the goal (<130/80). Tolerates amlodipine  10 mg daily, atenolol  25 mg daily, hctz 25 mg daily well, without any side effects Denies SOB, palpitation, chest pain, headaches,or swelling Reiterated the importance of regular exercise and low salt diet   Plan:  Continue taking amlodipine  10 mg daily, atenolol  25 mg daily, hctz 25 mg daily Patient to keep record of BP readings with heart rate and report to us  at the next visit Labs ordered today:  none   Morbid obesity with BMI of 45.0-49.9, adult [facility])  Patient has not met goal of at least 5% of body weight loss with comprehensive lifestyle modifications alone in the past 3-6 months. Pharmacotherapy is appropriate to pursue as augmentation. Will start Mounjaro.   Advised patient on common side effects including nausea, diarrhea, dyspepsia, decreased appetite, and fatigue. Counseled patient on reducing meal size and how to titrate medication to minimize side effects. Patient aware to call if intolerable side effects or if experiencing dehydration, abdominal pain, or dizziness. Patient will adhere to dietary modifications and will target  at least 150 minutes of moderate intensity exercise weekly, plus resistance training twice a week (as recommended by the American Heart Association). This resistance training--such as weightlifting, bodyweight exercises, or using resistance bands, adapted to the patient's ability--will help prevent muscle loss.  Injection technique reviewed at today's visit.    Follow up in 1-2 days regarding coverage of Mounjaro . If therapy is initiated, phone or MyChart follow-ups will be conducted every 4 weeks for dose titration until the patient reaches the effective therapeutic dose and target weight.   Patrick Salemi PharmD CPP [facility]  [address] [place], [place] [address] [phone]

## 2024-08-25 ENCOUNTER — Other Ambulatory Visit: Payer: Self-pay | Admitting: Family Medicine

## 2024-08-26 ENCOUNTER — Ambulatory Visit (INDEPENDENT_AMBULATORY_CARE_PROVIDER_SITE_OTHER): Admitting: Family Medicine

## 2024-08-26 DIAGNOSIS — Z0000 Encounter for general adult medical examination without abnormal findings: Secondary | ICD-10-CM

## 2024-08-26 MED ORDER — MOUNJARO 2.5 MG/0.5ML ~~LOC~~ SOAJ: 2.5000 mg | SUBCUTANEOUS | 0 refills | Status: AC

## 2024-08-26 NOTE — Patient Instructions (Addendum)
 I really enjoyed getting to talk with you today! I am available on Tuesdays and Thursdays for virtual visits if you have any questions or concerns, or if I can be of any further assistance.   CHECKLIST FROM ANNUAL WELLNESS VISIT:  -Follow up (please call to schedule if not scheduled after visit):   -yearly for annual wellness visit with primary care office  Here is a list of your preventive care/health maintenance measures and the plan for each if any are due:  PLAN For any measures below that may be due:    1. Please call your gastroenterologist if you change you mind about colon cancer screening: [phone]   2. Please ask your eye doctor to send copy of your eye exam   3. If you get any vaccines at the pharmacy, please let us  know so that we can update your records.   Health Maintenance  Topic Date Due   Zoster Vaccines- Shingrix (1 of 2) Never done   OPHTHALMOLOGY EXAM  07/18/2023   Colonoscopy  10/17/2023   FOOT EXAM  03/21/2024   Influenza Vaccine  05/09/2024   COVID-19 Vaccine (6 - 2025-26 season) 06/09/2024   Diabetic kidney evaluation - Urine ACR  12/31/2024   HEMOGLOBIN A1C  01/04/2025   Diabetic kidney evaluation - eGFR measurement  08/14/2025   Medicare Annual Wellness (AWV)  08/26/2025   Mammogram  08/30/2025   DTaP/Tdap/Td (3 - Td or Tdap) 01/08/2028   DEXA SCAN  Completed   Hepatitis C Screening  Completed   Meningococcal B Vaccine  Aged Out   Pneumococcal Vaccine: 50+ Years  Discontinued    -See a dentist at least yearly  -Get your eyes checked and then per your eye specialist's recommendations  -Other issues addressed today:   -I have included below further information regarding a healthy whole foods based diet, physical activity guidelines for adults, stress management and opportunities for social connections. I hope you find this information useful.    -----------------------------------------------------------------------------------------------------------------------------------------------------------------------------------------------------------------------------------------------------------    NUTRITION: -eat real food: lots of colorful vegetables (half the plate) and fruits -5-7 servings of vegetables and fruits per day (fresh or steamed is best), exp. 2 servings of vegetables with lunch and dinner and 2 servings of fruit per day. Berries and greens such as kale and collards are great choices.  -consume on a regular basis:  fresh fruits, fresh veggies, fish, nuts, seeds, healthy oils (such as olive oil, avocado oil), whole grains (make sure for bread/pasta/crackers/etc., that the first ingredient on label contains the word whole), legumes. -can eat small amounts of dairy and lean meat (no larger than the palm of your hand), but avoid processed meats such as ham, bacon, lunch meat, etc. -drink water -try to avoid fast food and pre-packaged foods, processed meat, ultra processed foods/beverages (donuts, candy, etc.) -most experts advise limiting sodium to < 2300mg  per day, should limit further is any chronic conditions such as high blood pressure, heart disease, diabetes, etc. The American Heart Association advised that < 1500mg  is is ideal -try to avoid foods/beverages that contain any ingredients with names you do not recognize  -try to avoid foods/beverages  with added sugar or sweeteners/sweets  -try to avoid sweet drinks (including diet drinks): soda, juice, Gatorade, sweet tea, power drinks, diet drinks -try to avoid white rice, white bread, pasta (unless whole grain)  EXERCISE GUIDELINES FOR ADULTS: -if you wish to increase your physical activity, do so gradually and with the approval of your doctor -STOP and seek medical  care immediately if you have any chest pain, chest discomfort or trouble breathing when starting or  increasing exercise  -move and stretch your body, legs, feet and arms when sitting for long periods -Physical activity guidelines for optimal health in adults: -get at least 150 minutes per week of moderate exercise (can talk, but not sing); this is about 20-30 minutes of sustained activity 5-7 days per week or two 10-15 minute episodes of sustained activity 5-7 days per week -do some muscle building/resistance training/strength training at least 2 days per week  -balance exercises 3+ days per week:   Stand somewhere where you have something sturdy to hold onto if you lose balance    1) lift up on toes, then back down, start with 5x per day and work up to 20x   2) stand and lift one leg straight out to the side so that foot is a few inches of the floor, start with 5x each side and work up to 20x each side   3) stand on one foot, start with 5 seconds each side and work up to 20 seconds on each side  If you need ideas or help with getting more active:  -Silver sneakers https://tools.silversneakers.com  -Walk with a Doc: Http://www.duncan-williams.com/  -try to include resistance (weight lifting/strength building) and balance exercises twice per week: or the following link for ideas: http://castillo-powell.com/  buyducts.dk  STRESS MANAGEMENT: -can try meditating, or just sitting quietly with deep breathing while intentionally relaxing all parts of your body for 5 minutes daily -if you need further help with stress, anxiety or depression please follow up with your primary doctor or contact the wonderful folks at [facility]: [phone]  SOCIAL CONNECTIONS: -options in [place] if you wish to engage in more social and exercise related activities:  -Silver sneakers https://tools.silversneakers.com  -Walk with a Doc: Http://www.duncan-williams.com/  -Check out the [facility] Active Adults 50+  section on the [place] of [employer] companies (hiking clubs, book clubs, cards and games, chess, exercise classes, aquatic classes and much more) - see the website for details: https://www.[place].gov/departments/parks-recreation/active-adults50  -YouTube has lots of exercise videos for different ages and abilities as well  -[facility] (a variety of indoor and outdoor inperson activities for adults). [phone]. [address].  -Virtual Online Classes (a variety of topics): see seniorplanet.org or call [phone]  -consider volunteering at a school, hospice center, church, senior center or elsewhere          ADVANCED HEALTHCARE DIRECTIVES:  [place] Advanced Directives assistance:   expressweek.com.cy  Everyone should have advanced health care directives in place. This is so that you get the care you want, should you ever be in a situation where you are unable to make your own medical decisions.   From the [place] Advanced Directive Website: Advance Health Care Directives are legal documents in which you give written instructions about your health care if, in the future, you cannot speak for yourself.   A health care power of attorney allows you to name a person you trust to make your health care decisions if you cannot make them yourself. A declaration of a desire for a natural death (or living will) is document, which states that you desire not to have your life prolonged by extraordinary measures if you have a terminal or incurable illness or if you are in a vegetative state. An advance instruction for mental health treatment makes a declaration of instructions, information and preferences regarding your mental health treatment. It also  states that you are aware that the advance instruction authorizes a mental health treatment provider to act according to your wishes. It may also outline your consent or  refusal of mental health treatment. A declaration of an anatomical gift allows anyone over the age of 80 to make a gift by will, organ donor card or other document.   Please see the following website or an elder law attorney for forms, FAQs and for completion of advanced directives: [place]  Print [employer] Planner Health Care Directives Advance Health Care Directives (http://guzman.com/)  Or copy and paste the following to your web browser: Poshchat.fi

## 2024-08-26 NOTE — Progress Notes (Signed)
 ----------------------------------------------------------------------------------------------------------------------------------------------------------------------------------------------------------------------  Because this visit was a virtual/telehealth visit, some criteria may be missing or patient reported. Any vitals not documented were not able to be obtained and vitals that have been documented are patient reported.    MEDICARE ANNUAL PREVENTIVE VISIT WITH PROVIDER: (Welcome to Medicare, initial annual wellness or annual wellness exam)  Virtual Visit via Video Note  I connected with Valeen Borys on 08/26/24 by a video enabled telemedicine application and verified that I am speaking with the correct person using two identifiers.  Location patient: home Location provider:work or home office Persons participating in the virtual visit: patient, provider  Concerns and/or follow up today: detailed intake and risks/health assessment completed in flowsheets and below - please see for details. Reports doing ok. Had reaction to losartan. Now on amlodipine  and BP has been better on last  check in the office per her report.  Had UTI recently - treated. She plans to start Mounjaro from cardiologist after the holidays.   How often do you have a drink containing alcohol?never How many drinks containing alcohol do you have on a typical day when you are drinking?na How often do you have six or more drinks on one occasion?na Have you ever smoked?y Quit date if applicable? 1999  How many packs a day do/did you smoke? na Do you use smokeless tobacco?na Do you use an illicit drugs?n Do you feel safe at home?n Last dentist visit? Does not go to the dentist - went several years ago, still has her teeth, takes good care of them Last eye Exam and location?[facility], goes on a regular basis   See HM section in Epic for other details of completed HM.    ROS: negative for report of fevers,  unintentional weight loss, vision changes, vision loss, hearing loss or change, chest pain, sob, hemoptysis, melena, hematochezia, hematuria, falls, bleeding or bruising, thoughts of suicide or self harm, memory loss  Patient-completed extensive health risk assessment - reviewed and discussed with the patient: See Health Risk Assessment completed with patient prior to the visit either above or in recent phone note. This was reviewed in detailed with the patient today and appropriate recommendations, orders and referrals were placed as needed per Summary below and patient instructions.   Review of Medical History: -PMH, PSH, Family History and current specialty and care providers reviewed and updated and listed below   Patient Care Team: Jordan, [name], MD as PCP - General (Family Medicine) Ladona Heinz, MD as PCP - Cardiology (Cardiology) Pa, [facility]   Past Medical History:  Diagnosis Date   Abdominal pain, epigastric 12/28/2008   Centricity Description: ABDOMINAL PAIN, EPIGASTRIC Qualifier: Diagnosis of  By: Earlean CMA LEODIS), [name]   Centricity Description: ABDOMINAL PAIN-EPIGASTRIC Qualifier: Diagnosis of  By: Ever Riggers, [name]    Allergic rhinitis 09/16/2013   Allergy    Anemia    Anxiety    Anxiety disorder, unspecified 07/06/2017   Arthritis    Blood in stool    BMI 45.0-49.9, adult (HCC) 07/26/2016   Class 3 obesity with serious comorbidity and body mass index (BMI) of 45.0 to 49.9 in adult 03/07/2017   DDD (degenerative disc disease), lumbar    Diabetes mellitus without complication (HCC)    DIVERTICULOSIS-COLON 12/28/2008   Qualifier: Diagnosis of  By: Ever Riggers, [name]    GERD 12/28/2008   Qualifier: Diagnosis of  By: Ever Riggers, [name]    GERD (gastroesophageal reflux disease)    Headache  Hyperglycemia 03/17/2014   Hypertension    Hypokalemia 07/26/2016   IBS (irritable bowel syndrome)-C-D 07/26/2016   Obesity    Other and unspecified  hyperlipidemia 03/17/2014   PERSONAL HX COLONIC POLYPS 12/28/2008   Qualifier: Diagnosis of  By: Ever PA-c, [name]    Reflux esophagitis 12/28/2008   Qualifier: Diagnosis of  By: Earlean CMA LEODIS), [name]     S/P lumbar spinal fusion 07/02/2012   Tubulovillous adenoma of colon 13-Sep-2007    Past Surgical History:  Procedure Laterality Date   ABDOMINAL HYSTERECTOMY     COLONOSCOPY  September 12, 2013   laser surgery to remove scar     not sure of the year; after hyst   RADIOLOGY WITH ANESTHESIA N/A 02/25/2015   Procedure: MRI OF LUMBAR SPINE;  Surgeon: Medication Radiologist, MD;  Location: [facility] OR;  Service: Radiology;  Laterality: N/A;  DR. 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  Tobacco Use   Smoking status: Former    Current packs/day: 0.00    Average packs/day: 1 pack/day for 38.0 years (38.0 ttl pk-yrs)    Types: Cigarettes    Start date: 09/08/1960    Quit date: 09/08/1998    Years since quitting: 25.9   Smokeless tobacco: Never   Tobacco comments:    Former smoker 06/06/21  Vaping Use   Vaping status: Never Used  Substance and Sexual Activity   Alcohol use: No   Drug use: No   Sexual activity: Not Currently    Comment: Husband passed away in 1999-09-13  Other Topics Concern   Not on file  Social History Narrative   Widow, 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   Social Drivers of Corporate Investment Banker Strain: Low Risk  (08/25/2024)   Overall Financial Resource Strain (CARDIA)    Difficulty of Paying Living Expenses: Not very hard  Food Insecurity: Food Insecurity Present (08/25/2024)   Hunger Vital Sign    Worried About Running Out of Food in the Last Year: Sometimes  true    Ran Out of Food in the Last Year: Sometimes true  Transportation Needs: No Transportation Needs (08/25/2024)   PRAPARE - Administrator, Civil Service (Medical): No    Lack of Transportation (Non-Medical): No  Physical Activity: Inactive (08/26/2024)   Exercise Vital Sign    Days of Exercise per Week: Not on file    Minutes of Exercise per Session: 0 min  Stress: Stress Concern Present (08/25/2024)   [employer] of Occupational Health - Occupational Stress Questionnaire    Feeling of Stress: To some extent  Social Connections: Moderately Integrated (08/25/2024)   Social Connection and Isolation Panel    Frequency of Communication with Friends and Family: Three times a week    Frequency of Social Gatherings with Friends and Family: Once a week    Attends Religious Services: More than 4 times per year    Active Member of [employer] or Organizations: Yes    Attends Banker Meetings: More than 4 times per year    Marital Status: Widowed  Intimate Partner Violence: Not At Risk (08/11/2022)  Humiliation, Afraid, Rape, and Kick questionnaire    Fear of Current or Ex-Partner: No    Emotionally Abused: No    Physically Abused: No    Sexually Abused: No    Family History  Problem Relation Age of Onset   Hypertension Mother    Hyperlipidemia Mother    Hyperlipidemia Father    Hypertension Father    Arthritis Father    Hyperlipidemia Sister    Cervical cancer Sister    Diabetes Sister    Heart disease Sister    Cancer Maternal Grandfather    Hypertension Paternal Grandmother    Pancreatic cancer Maternal Aunt    Breast cancer Paternal Aunt    Breast cancer Cousin     Current Outpatient Medications on File Prior to Visit  Medication Sig Dispense Refill   amLODipine  (NORVASC ) 10 MG tablet Take 1 tablet (10 mg total) by mouth daily. 90 tablet 3   apixaban  (ELIQUIS ) 5 MG TABS tablet Take 1 tablet by mouth twice daily 180 tablet 2   atenolol   (TENORMIN ) 25 MG tablet TAKE 1/2 (ONE-HALF) TABLET BY MOUTH ONCE DAILY MAY  TAKE  EXTRA  ONE-HALF  FOR  BREAKTHROUGH  AFIB 90 tablet 2   calcium  carbonate (TUMS EX) 750 MG chewable tablet Chew 2 tablets by mouth daily as needed for heartburn.     Cetirizine HCl (ZYRTEC ALLERGY PO) Take 10 mg by mouth as needed.     cyclobenzaprine (FLEXERIL) 5 MG tablet Take 2.5 mg by mouth daily as needed (pain level 8 or greater).     Docusate Sodium (STOOL SOFTENER LAXATIVE PO) Take by mouth as needed.     flecainide  (TAMBOCOR ) 100 MG tablet Take 1 tablet by mouth twice daily 180 tablet 2   fluticasone  (FLONASE ) 50 MCG/ACT nasal spray Use 2 spray(s) in each nostril once daily 48 g 0   glucose blood (ACCU-CHEK GUIDE) test strip USE 1 STRIP ONCE TO  TWICE DAILY TO TEST BLOOD SUGARS 100 each 2   hydrochlorothiazide  (HYDRODIURIL ) 25 MG tablet Take 1 tablet by mouth once daily 90 tablet 2   HYDROcodone -acetaminophen  (NORCO/VICODIN) 5-325 MG tablet Take 0.5 tablets by mouth daily as needed (pain level 8 or greater). 30 tablet 0   hydrOXYzine  (ATARAX ) 25 MG tablet TAKE 1 TABLET BY MOUTH THREE TIMES DAILY AS NEEDED FOR ANXIETY 90 tablet 0   lidocaine  4 % Place 1 patch onto the skin daily.     Metamucil Fiber CHEW Chew by mouth as needed.     Multiple Vitamins-Minerals (MULTI-VITAMIN GUMMIES PO) Take by mouth as needed.     omeprazole  (PRILOSEC) 40 MG capsule Take 40 mg by mouth 2 (two) times daily.     Polyethylene Glycol 3350  (MIRALAX PO) Take by mouth as needed.     potassium chloride  (KLOR-CON ) 10 MEQ tablet Take 2 tablets by mouth once daily 180 tablet 0   pravastatin  (PRAVACHOL ) 40 MG tablet Take 1 tablet by mouth twice a week 27 tablet 3   Simethicone (GAS-X PO) Take by mouth as needed.     trolamine salicylate (ASPERCREME) 10 % cream Apply 1 application topically as needed for muscle pain.     Accu-Chek Softclix Lancets lancets Use to check blood sugars 1-2 times daily. 100 each 12    Aspirin-Salicylamide-Caffeine (BC FAST PAIN RELIEF) 650-195-33.3 MG PACK Take 1 tablet by mouth as needed.     azelastine  (ASTELIN ) 0.1 % nasal spray Place 2 sprays into both nostrils 2 (two) times daily. Use in  each nostril as directed (Patient taking differently: Place 2 sprays into both nostrils as needed. Use in each nostril as directed) 30 mL 6   Blood Glucose Monitoring Suppl (ACCU-CHEK GUIDE ME) w/Device KIT USE TO CHECK GLUCOSE ONCE DAILY TO TWICE DAILY 1 kit 0   ciclopirox  (LOPROX ) 0.77 % cream Apply topically 2 (two) times daily. 15 g 0   ciclopirox  (PENLAC ) 8 % solution Apply topically at bedtime. Apply over nail and surrounding skin. Apply daily over previous coat. After seven (7) days, may remove with alcohol and continue cycle. 6.6 mL 2   hydrocortisone  2.5 % ointment Apply topically daily as needed. 30 g 2   Menthol-Zinc Oxide (CALMOSEPTINE) 0.44-20.6 % OINT Apply topically as needed.     tirzepatide (MOUNJARO) 2.5 MG/0.5ML Pen Inject 2.5 mg into the skin once a week. (Patient not taking: Reported on 08/26/2024) 2 mL 0   No current facility-administered medications on file prior to visit.    Allergies  Allergen Reactions   Crestor  [Rosuvastatin ]    Erythromycin     rash   Jublia  [Efinaconazole ] Hives    Redness/burning around nail   Lansoprazole    Losartan Other (See Comments)    Renal issues.    Penicillins     rash   Potassium-Containing Compounds Other (See Comments)    Stomach upset, can take pills, but not the packets    Robaxin [Methocarbamol]     Messed up stomach   Tea Tree Oil     Redness, burning (localized)   Trental [Pentoxifylline Er]     Bad headache   Sulfonamide Derivatives Rash    rash       Physical Exam Vitals requested from patient and listed below if patient had equipment and was able to obtain at home for this virtual visit: There were no vitals filed for this visit. Estimated body mass index is 46.29 kg/m as calculated from the  following:   Height as of 08/19/24: 5' 1 (1.549 m).   Weight as of 08/19/24: 245 lb (111.1 kg).  EKG (optional): deferred due to virtual visit  GENERAL: alert, oriented, no acute distress detected, full vision exam deferred due to pandemic and/or virtual encounter  HEENT: atraumatic, conjunttiva clear, no obvious abnormalities on inspection of external nose and ears  NECK: normal movements of the head and neck  LUNGS: on inspection no signs of respiratory distress, breathing rate appears normal, no obvious gross SOB, gasping or wheezing  CV: no obvious cyanosis  MS: moves all visible extremities without noticeable abnormality  PSYCH/NEURO: pleasant and cooperative, no obvious depression or anxiety, speech and thought processing grossly intact, Cognitive function grossly intact  Flowsheet Row Office Visit from [facility]/01/2024 in [facility]  PHQ-9 Total Score 5        08/26/2024    6:28 PM 01/01/2024   11:35 AM 08/16/2023   11:32 AM 07/27/2023   12:08 PM 04/04/2023   10:33 AM  Depression screen PHQ 2/9  Decreased Interest 0 0 0 0 0  Down, Depressed, Hopeless 0 0 0 0 0  PHQ - 2 Score 0 0 0 0 0  Altered sleeping  1 0  0  Tired, decreased energy  1 0  1  Change in appetite  1 0  0  Feeling bad or failure about yourself   0 0  0  Trouble concentrating  0 0  0  Moving slowly or fidgety/restless  2 0  0  Suicidal thoughts  0 0  0  PHQ-9 Score  5  0   1   Difficult doing work/chores  Somewhat difficult Not difficult at all  Somewhat difficult     Data saved with a previous flowsheet row definition       04/04/2023   10:33 AM 08/16/2023   11:34 AM 12/28/2023    6:08 PM 08/25/2024    8:24 PM 08/26/2024    6:12 PM  Fall Risk  Falls in the past year? 0 1 1 1    Was there an injury with Fall? 0 0 0 0   Fall Risk Category Calculator 0 2 2  2     Patient at Risk for Falls Due to Other (Comment) History of fall(s)   History of fall(s)  Fall risk Follow  up Falls evaluation completed Falls evaluation completed   Falls evaluation completed;Education provided     Patient-reported     SUMMARY AND PLAN:  Encounter for Medicare annual wellness exam  Discussed applicable health maintenance/preventive health measures and advised and referred or ordered per patient preferences: -reports had her flu shot, she plans to do the covid shot at the end of the month -reports her gastroenterologist retired, she declined further colon cancer screening -sees eye doctor on a regular basis, asked that she request that they send copies of her exam Health Maintenance  Topic Date Due   Zoster Vaccines- Shingrix (1 of 2) Never done   OPHTHALMOLOGY EXAM  07/18/2023   Colonoscopy  10/17/2023   FOOT EXAM  03/21/2024   Influenza Vaccine  05/09/2024   COVID-19 Vaccine (6 - 2025-26 season) 06/09/2024   Diabetic kidney evaluation - Urine ACR  12/31/2024   HEMOGLOBIN A1C  01/04/2025   Diabetic kidney evaluation - eGFR measurement  08/14/2025   Medicare Annual Wellness (AWV)  08/26/2025   Mammogram  08/30/2025   DTaP/Tdap/Td (3 - Td or Tdap) 01/08/2028   DEXA SCAN  Completed   Hepatitis C Screening  Completed   Meningococcal B Vaccine  Aged Out   Pneumococcal Vaccine: 50+ Years  Discontinued      Education and counseling on the following was provided based on the above review of health and a plan/checklist for the patient, along with additional information discussed, was provided for the patient in the patient instructions :  -Advised on importance of completing advanced directives, discussed options for completing and offered information - she declined.  -Provided counseling and plan for difficulty hearing  -Provided counseling and plan for increased risk of falling if applicable per above screening. Reviewed and demonstrated safe balance exercises that can be done at home to improve balance and discussed exercise guidelines for adults with include balance  exercises at least 3 days per week.  -Advised and counseled on a healthy lifestyle - including the importance of a healthy diet, regular physical activity, social connections and stress management. -Reviewed patient's current diet. Advised and counseled on a whole foods based healthy diet. A summary of a healthy diet was provided in the Patient Instructions.  -reviewed patient's current physical activity level and discussed exercise guidelines for adults. Discussed community resources and ideas for safe exercise at home to assist in meeting exercise guideline recommendations in a safe and healthy way.  -Advise yearly dental visits at minimum and regular eye exams   Follow up: see patient instructions     Patient Instructions  I really enjoyed getting to talk with you today! I am available on Tuesdays and Thursdays for virtual visits if  you have any questions or concerns, or if I can be of any further assistance.   CHECKLIST FROM ANNUAL WELLNESS VISIT:  -Follow up (please call to schedule if not scheduled after visit):   -yearly for annual wellness visit with primary care office  Here is a list of your preventive care/health maintenance measures and the plan for each if any are due:  PLAN For any measures below that may be due:    1. Please call your gastroenterologist if you change you mind about colon cancer screening: [phone]   2. Please ask your eye doctor to send copy of your eye exam   3. If you get any vaccines at the pharmacy, please let us  know so that we can update your records.   Health Maintenance  Topic Date Due   Zoster Vaccines- Shingrix (1 of 2) Never done   OPHTHALMOLOGY EXAM  07/18/2023   Colonoscopy  10/17/2023   FOOT EXAM  03/21/2024   Influenza Vaccine  05/09/2024   COVID-19 Vaccine (6 - 2025-26 season) 06/09/2024   Diabetic kidney evaluation - Urine ACR  12/31/2024   HEMOGLOBIN A1C  01/04/2025   Diabetic kidney evaluation - eGFR measurement   08/14/2025   Medicare Annual Wellness (AWV)  08/26/2025   Mammogram  08/30/2025   DTaP/Tdap/Td (3 - Td or Tdap) 01/08/2028   DEXA SCAN  Completed   Hepatitis C Screening  Completed   Meningococcal B Vaccine  Aged Out   Pneumococcal Vaccine: 50+ Years  Discontinued    -See a dentist at least yearly  -Get your eyes checked and then per your eye specialist's recommendations  -Other issues addressed today:   -I have included below further information regarding a healthy whole foods based diet, physical activity guidelines for adults, stress management and opportunities for social connections. I hope you find this information useful.   -----------------------------------------------------------------------------------------------------------------------------------------------------------------------------------------------------------------------------------------------------------    NUTRITION: -eat real food: lots of colorful vegetables (half the plate) and fruits -5-7 servings of vegetables and fruits per day (fresh or steamed is best), exp. 2 servings of vegetables with lunch and dinner and 2 servings of fruit per day. Berries and greens such as kale and collards are great choices.  -consume on a regular basis:  fresh fruits, fresh veggies, fish, nuts, seeds, healthy oils (such as olive oil, avocado oil), whole grains (make sure for bread/pasta/crackers/etc., that the first ingredient on label contains the word whole), legumes. -can eat small amounts of dairy and lean meat (no larger than the palm of your hand), but avoid processed meats such as ham, bacon, lunch meat, etc. -drink water -try to avoid fast food and pre-packaged foods, processed meat, ultra processed foods/beverages (donuts, candy, etc.) -most experts advise limiting sodium to < 2300mg  per day, should limit further is any chronic conditions such as high blood pressure, heart disease, diabetes, etc. The American Heart  Association advised that < 1500mg  is is ideal -try to avoid foods/beverages that contain any ingredients with names you do not recognize  -try to avoid foods/beverages  with added sugar or sweeteners/sweets  -try to avoid sweet drinks (including diet drinks): soda, juice, Gatorade, sweet tea, power drinks, diet drinks -try to avoid white rice, white bread, pasta (unless whole grain)  EXERCISE GUIDELINES FOR ADULTS: -if you wish to increase your physical activity, do so gradually and with the approval of your doctor -STOP and seek medical care immediately if you have any chest pain, chest discomfort or trouble breathing when starting or increasing exercise  -  move and stretch your body, legs, feet and arms when sitting for long periods -Physical activity guidelines for optimal health in adults: -get at least 150 minutes per week of moderate exercise (can talk, but not sing); this is about 20-30 minutes of sustained activity 5-7 days per week or two 10-15 minute episodes of sustained activity 5-7 days per week -do some muscle building/resistance training/strength training at least 2 days per week  -balance exercises 3+ days per week:   Stand somewhere where you have something sturdy to hold onto if you lose balance    1) lift up on toes, then back down, start with 5x per day and work up to 20x   2) stand and lift one leg straight out to the side so that foot is a few inches of the floor, start with 5x each side and work up to 20x each side   3) stand on one foot, start with 5 seconds each side and work up to 20 seconds on each side  If you need ideas or help with getting more active:  -Silver sneakers https://tools.silversneakers.com  -Walk with a Doc: Http://www.duncan-williams.com/  -try to include resistance (weight lifting/strength building) and balance exercises twice per week: or the following link for  ideas: http://castillo-powell.com/  buyducts.dk  STRESS MANAGEMENT: -can try meditating, or just sitting quietly with deep breathing while intentionally relaxing all parts of your body for 5 minutes daily -if you need further help with stress, anxiety or depression please follow up with your primary doctor or contact the wonderful folks at [facility]: [phone]  SOCIAL CONNECTIONS: -options in [place] if you wish to engage in more social and exercise related activities:  -Silver sneakers https://tools.silversneakers.com  -Walk with a Doc: Http://www.duncan-williams.com/  -Check out the [employer] Active Adults 50+ section on the [place] of [employer] companies (hiking clubs, book clubs, cards and games, chess, exercise classes, aquatic classes and much more) - see the website for details: https://www.[place].gov/departments/parks-recreation/active-adults50  -YouTube has lots of exercise videos for different ages and abilities as well  -[facility] (a variety of indoor and outdoor inperson activities for adults). 6291162827. [address]  -Virtual Online Classes (a variety of topics): see seniorplanet.org or call [phone]  -consider volunteering at a school, hospice center, church, senior center or elsewhere          ADVANCED HEALTHCARE DIRECTIVES:  [place] Advanced Directives assistance:   expressweek.com.cy  Everyone should have advanced health care directives in place. This is so that you get the care you want, should you ever be in a situation where you are unable to make your own medical decisions.   From the [place] Advanced Directive Website: Advance Health Care Directives are legal documents in which you give written instructions about your health care if, in the future, you cannot  speak for yourself.   A health care power of attorney allows you to name a person you trust to make your health care decisions if you cannot make them yourself. A declaration of a desire for a natural death (or living will) is document, which states that you desire not to have your life prolonged by extraordinary measures if you have a terminal or incurable illness or if you are in a vegetative state. An advance instruction for mental health treatment makes a declaration of instructions, information and preferences regarding your mental health treatment. It also states that you are aware that the advance instruction authorizes a mental health treatment provider to act according to  your wishes. It may also outline your consent or refusal of mental health treatment. A declaration of an anatomical gift allows anyone over the age of 26 to make a gift by will, organ donor card or other document.   Please see the following website or an elder law attorney for forms, FAQs and for completion of advanced directives:   Print Production Planner Health Care Directives Advance Health Care Directives (http://guzman.com/)  Or copy and paste the following to your web browser: [email or web address]    Chiquita JONELLE Cramp, DO

## 2024-08-31 ENCOUNTER — Other Ambulatory Visit: Payer: Self-pay | Admitting: Family Medicine

## 2024-09-08 ENCOUNTER — Telehealth: Payer: Self-pay

## 2024-09-08 DIAGNOSIS — Z1231 Encounter for screening mammogram for malignant neoplasm of breast: Secondary | ICD-10-CM | POA: Diagnosis not present

## 2024-09-08 LAB — HM MAMMOGRAPHY

## 2024-09-08 NOTE — Telephone Encounter (Signed)
 Copied from CRM #[id number]. Topic: Clinical - Medication Question >> Sep 08, 2024  9:26 AM Amy B wrote: Reason for CRM:  Patient states she has a yeast infection and requests a medication to be called in to her pharmacy for the itching.  She states she has had this in the past with a medication request sent to her pharmacy.

## 2024-09-09 ENCOUNTER — Other Ambulatory Visit: Payer: Self-pay | Admitting: Family Medicine

## 2024-09-09 MED ORDER — TERCONAZOLE 0.4 % VA CREA: 1.0000 | TOPICAL_CREAM | Freq: Every day | VAGINAL | 0 refills | Status: AC

## 2024-09-09 MED ORDER — FLUCONAZOLE 150 MG PO TABS: 150.0000 mg | ORAL_TABLET | Freq: Once | ORAL | 0 refills | Status: AC

## 2024-09-09 NOTE — Telephone Encounter (Signed)
 Patient is aware.

## 2024-09-09 NOTE — Telephone Encounter (Signed)
 Sent prescriptions to treat empirically for yeast infection. Try vaginal cream first and if still symptomatic after 48 to 72 hours she can take Diflucan  150 mg once. If the problem is persistent, she is going to need a pelvic examination, which can be done here in the office or with her gynecologist. Thanks, BJ

## 2024-09-14 ENCOUNTER — Other Ambulatory Visit: Payer: Self-pay | Admitting: Family Medicine

## 2024-09-14 DIAGNOSIS — I10 Essential (primary) hypertension: Secondary | ICD-10-CM

## 2024-09-26 ENCOUNTER — Other Ambulatory Visit: Payer: Self-pay | Admitting: Family Medicine

## 2024-09-30 ENCOUNTER — Other Ambulatory Visit (HOSPITAL_COMMUNITY): Payer: Self-pay | Admitting: Physician Assistant

## 2024-10-14 ENCOUNTER — Telehealth: Payer: Self-pay | Admitting: Pharmacist Clinician (PhC)/ Clinical Pharmacy Specialist

## 2024-10-14 NOTE — Telephone Encounter (Signed)
 Patient is calling to find out if she should start taking tirzepatide  (MOUNJARO ) 2.5 MG/0.5ML Pen or wait until after she sees the GI doctor.  Please advise.

## 2024-10-15 ENCOUNTER — Other Ambulatory Visit: Payer: Self-pay | Admitting: Family Medicine

## 2024-10-15 DIAGNOSIS — E876 Hypokalemia: Secondary | ICD-10-CM

## 2024-10-16 NOTE — Telephone Encounter (Signed)
 Having issues with constipation and impaction.  Advised confer with GI MD before starting

## 2024-10-27 ENCOUNTER — Ambulatory Visit: Admitting: Podiatry

## 2024-10-27 ENCOUNTER — Ambulatory Visit

## 2024-10-27 DIAGNOSIS — S90122A Contusion of left lesser toe(s) without damage to nail, initial encounter: Secondary | ICD-10-CM | POA: Diagnosis not present

## 2024-10-27 DIAGNOSIS — M79675 Pain in left toe(s): Secondary | ICD-10-CM | POA: Diagnosis not present

## 2024-10-27 DIAGNOSIS — Z7901 Long term (current) use of anticoagulants: Secondary | ICD-10-CM | POA: Diagnosis not present

## 2024-10-27 DIAGNOSIS — M79674 Pain in right toe(s): Secondary | ICD-10-CM

## 2024-10-27 DIAGNOSIS — B351 Tinea unguium: Secondary | ICD-10-CM

## 2024-10-28 ENCOUNTER — Other Ambulatory Visit: Payer: Self-pay | Admitting: Family Medicine

## 2024-10-31 ENCOUNTER — Encounter: Payer: Self-pay | Admitting: Podiatry

## 2024-10-31 NOTE — Progress Notes (Signed)
 Subjective: Chief Complaint  Patient presents with   [facility]    NIDDM patient with an A1c of 7.1 presents today for [facility] and nail trim reports neuropathy symptoms are still the same       73 year old female presents the office today for concerns of thick, elongated nails that she is not able to trim herself.  No swelling redness or any drainage to toe sites.  No open lesions.    She also states has been having some pain in the left foot, mostly left fifth toe.  No history of current injury.  The area gets tender at time.  No open lesions.  Last A1c was 7.1 on July 07, 2024 Jordan, [name], MD Last seen 07/30/2024  On Eliquis   Objective: AAO x3, NAD DP/PT pulses palpable bilaterally, CRT less than 3 seconds Nails are hypertrophic, dystrophic, brittle, discolored, elongated 10. No surrounding redness or drainage. Tenderness nails 1-5 bilaterally.  The prescription left hallux appears to have healed.  There is no edema, erythema, drainage or pus or signs of infection today. No open lesions present. Mild tenderness palpation of left lesser toe.  There is no other areas of pinpoint tenderness.  Digital contractures present. Chronic edema present bilaterally.  No erythema or warmth.    Assessment: Onychomycosis, on Eliquis ; toe pain  Plan: Symptomatic onychomycosis  -Sharply debrided the nails x 10 without any complications or bleeding.  -Daily foot inspection  Toe pain - X-rays obtained and reviewed.  Multiple views obtained.  Digital contractures are present.  There is no evidence of acute fracture. - Continue supportive shoes, offloading.  Stiffer soled shoes.  Return in about 3 months (around 01/25/2025).   Donnice JONELLE Fees DPM

## 2024-11-14 ENCOUNTER — Ambulatory Visit (HOSPITAL_COMMUNITY): Admitting: Physician Assistant

## 2024-11-21 ENCOUNTER — Ambulatory Visit (HOSPITAL_COMMUNITY): Admitting: Physician Assistant

## 2025-01-27 ENCOUNTER — Ambulatory Visit: Admitting: Podiatry
